# Patient Record
Sex: Female | Born: 1965 | Race: Black or African American | Hispanic: No | Marital: Single
Health system: Midwestern US, Community
[De-identification: ages and names within clinical notes are randomized; demographics above are authoritative.]

## PROBLEM LIST (undated history)

## (undated) DIAGNOSIS — I82409 Acute embolism and thrombosis of unspecified deep veins of unspecified lower extremity: Secondary | ICD-10-CM

## (undated) DIAGNOSIS — N6310 Unspecified lump in the right breast, unspecified quadrant: Secondary | ICD-10-CM

## (undated) DIAGNOSIS — Z9289 Personal history of other medical treatment: Secondary | ICD-10-CM

## (undated) DIAGNOSIS — I739 Peripheral vascular disease, unspecified: Secondary | ICD-10-CM

## (undated) DIAGNOSIS — S93432A Sprain of tibiofibular ligament of left ankle, initial encounter: Secondary | ICD-10-CM

## (undated) DIAGNOSIS — D649 Anemia, unspecified: Secondary | ICD-10-CM

## (undated) DIAGNOSIS — Z9889 Other specified postprocedural states: Secondary | ICD-10-CM

## (undated) DIAGNOSIS — T8859XA Other complications of anesthesia, initial encounter: Secondary | ICD-10-CM

## (undated) DIAGNOSIS — M8700 Idiopathic aseptic necrosis of unspecified bone: Secondary | ICD-10-CM

## (undated) DIAGNOSIS — Z95828 Presence of other vascular implants and grafts: Secondary | ICD-10-CM

## (undated) DIAGNOSIS — J42 Unspecified chronic bronchitis: Secondary | ICD-10-CM

## (undated) DIAGNOSIS — N12 Tubulo-interstitial nephritis, not specified as acute or chronic: Secondary | ICD-10-CM

## (undated) DIAGNOSIS — R011 Cardiac murmur, unspecified: Secondary | ICD-10-CM

## (undated) DIAGNOSIS — K219 Gastro-esophageal reflux disease without esophagitis: Secondary | ICD-10-CM

## (undated) DIAGNOSIS — L0390 Cellulitis, unspecified: Secondary | ICD-10-CM

## (undated) DIAGNOSIS — F4310 Post-traumatic stress disorder, unspecified: Secondary | ICD-10-CM

## (undated) DIAGNOSIS — R609 Edema, unspecified: Secondary | ICD-10-CM

## (undated) DIAGNOSIS — F419 Anxiety disorder, unspecified: Secondary | ICD-10-CM

## (undated) DIAGNOSIS — I2699 Other pulmonary embolism without acute cor pulmonale: Secondary | ICD-10-CM

## (undated) DIAGNOSIS — J189 Pneumonia, unspecified organism: Secondary | ICD-10-CM

## (undated) DIAGNOSIS — R569 Unspecified convulsions: Secondary | ICD-10-CM

## (undated) DIAGNOSIS — Z87442 Personal history of urinary calculi: Secondary | ICD-10-CM

## (undated) DIAGNOSIS — I10 Essential (primary) hypertension: Secondary | ICD-10-CM

## (undated) DIAGNOSIS — G43909 Migraine, unspecified, not intractable, without status migrainosus: Secondary | ICD-10-CM

## (undated) DIAGNOSIS — J45909 Unspecified asthma, uncomplicated: Secondary | ICD-10-CM

## (undated) DIAGNOSIS — R319 Hematuria, unspecified: Secondary | ICD-10-CM

## (undated) DIAGNOSIS — T4145XA Adverse effect of unspecified anesthetic, initial encounter: Secondary | ICD-10-CM

## (undated) DIAGNOSIS — M329 Systemic lupus erythematosus, unspecified: Secondary | ICD-10-CM

## (undated) DIAGNOSIS — C55 Malignant neoplasm of uterus, part unspecified: Secondary | ICD-10-CM

## (undated) DIAGNOSIS — N390 Urinary tract infection, site not specified: Secondary | ICD-10-CM

## (undated) DIAGNOSIS — M1990 Unspecified osteoarthritis, unspecified site: Secondary | ICD-10-CM

## (undated) HISTORY — PX: HIP SURGERY: SHX245

## (undated) HISTORY — PX: TOTAL HIP REVISION: SHX763

## (undated) HISTORY — PX: INCISION AND DRAINAGE HIP: SHX1801

## (undated) HISTORY — PX: JOINT REPLACEMENT: SHX530

## (undated) HISTORY — PX: APPLICATION OF WOUND VAC: SHX5189

## (undated) HISTORY — PX: EXTERNAL EAR SURGERY: SHX627

## (undated) HISTORY — PX: MYRINGOTOMY WITH TUBE PLACEMENT: SHX5663

## (undated) HISTORY — DX: Edema, unspecified: R60.9

## (undated) HISTORY — PX: DILATION AND CURETTAGE OF UTERUS: SHX78

---

## 1988-10-31 HISTORY — PX: CHOLECYSTECTOMY OPEN: SUR202

## 1993-10-31 HISTORY — PX: TUBAL LIGATION: SHX77

## 1995-11-01 DIAGNOSIS — C55 Malignant neoplasm of uterus, part unspecified: Secondary | ICD-10-CM

## 1995-11-01 HISTORY — DX: Malignant neoplasm of uterus, part unspecified: C55

## 1996-02-29 HISTORY — PX: VAGINAL HYSTERECTOMY: SUR661

## 1997-12-29 ENCOUNTER — Ambulatory Visit (HOSPITAL_COMMUNITY): Admission: RE | Admit: 1997-12-29 | Discharge: 1997-12-29 | Payer: Self-pay | Admitting: Obstetrics

## 1998-01-20 ENCOUNTER — Inpatient Hospital Stay (HOSPITAL_COMMUNITY): Admission: AD | Admit: 1998-01-20 | Discharge: 1998-01-20 | Payer: Self-pay | Admitting: *Deleted

## 1998-02-05 ENCOUNTER — Inpatient Hospital Stay (HOSPITAL_COMMUNITY): Admission: AD | Admit: 1998-02-05 | Discharge: 1998-02-05 | Payer: Self-pay | Admitting: Obstetrics & Gynecology

## 1998-02-19 ENCOUNTER — Encounter: Admission: RE | Admit: 1998-02-19 | Discharge: 1998-02-19 | Payer: Self-pay | Admitting: *Deleted

## 1998-02-26 ENCOUNTER — Encounter: Admission: RE | Admit: 1998-02-26 | Discharge: 1998-02-26 | Payer: Self-pay | Admitting: Obstetrics

## 1998-03-05 ENCOUNTER — Inpatient Hospital Stay (HOSPITAL_COMMUNITY): Admission: AD | Admit: 1998-03-05 | Discharge: 1998-03-05 | Payer: Self-pay | Admitting: Obstetrics & Gynecology

## 1998-03-15 ENCOUNTER — Emergency Department (HOSPITAL_COMMUNITY): Admission: EM | Admit: 1998-03-15 | Discharge: 1998-03-15 | Payer: Self-pay | Admitting: Emergency Medicine

## 1998-05-24 ENCOUNTER — Inpatient Hospital Stay (HOSPITAL_COMMUNITY): Admission: AD | Admit: 1998-05-24 | Discharge: 1998-05-24 | Payer: Self-pay | Admitting: Obstetrics

## 1998-05-31 ENCOUNTER — Emergency Department (HOSPITAL_COMMUNITY): Admission: EM | Admit: 1998-05-31 | Discharge: 1998-05-31 | Payer: Self-pay | Admitting: Emergency Medicine

## 1998-06-03 ENCOUNTER — Emergency Department (HOSPITAL_COMMUNITY): Admission: EM | Admit: 1998-06-03 | Discharge: 1998-06-03 | Payer: Self-pay | Admitting: Emergency Medicine

## 1998-06-16 ENCOUNTER — Emergency Department (HOSPITAL_COMMUNITY): Admission: EM | Admit: 1998-06-16 | Discharge: 1998-06-16 | Payer: Self-pay | Admitting: Internal Medicine

## 1998-06-26 ENCOUNTER — Emergency Department (HOSPITAL_COMMUNITY): Admission: EM | Admit: 1998-06-26 | Discharge: 1998-06-26 | Payer: Self-pay

## 1998-08-25 ENCOUNTER — Encounter: Payer: Self-pay | Admitting: Obstetrics

## 1998-08-25 ENCOUNTER — Inpatient Hospital Stay (HOSPITAL_COMMUNITY): Admission: AD | Admit: 1998-08-25 | Discharge: 1998-08-29 | Payer: Self-pay | Admitting: Obstetrics

## 1998-08-27 ENCOUNTER — Encounter: Payer: Self-pay | Admitting: Obstetrics

## 1999-02-22 ENCOUNTER — Emergency Department (HOSPITAL_COMMUNITY): Admission: EM | Admit: 1999-02-22 | Discharge: 1999-02-23 | Payer: Self-pay | Admitting: Emergency Medicine

## 1999-02-22 ENCOUNTER — Encounter: Payer: Self-pay | Admitting: Emergency Medicine

## 1999-06-14 ENCOUNTER — Emergency Department (HOSPITAL_COMMUNITY): Admission: EM | Admit: 1999-06-14 | Discharge: 1999-06-14 | Payer: Self-pay | Admitting: Emergency Medicine

## 1999-06-14 ENCOUNTER — Encounter: Payer: Self-pay | Admitting: Emergency Medicine

## 1999-09-27 ENCOUNTER — Emergency Department (HOSPITAL_COMMUNITY): Admission: EM | Admit: 1999-09-27 | Discharge: 1999-09-27 | Payer: Self-pay | Admitting: Emergency Medicine

## 1999-11-01 ENCOUNTER — Emergency Department (HOSPITAL_COMMUNITY): Admission: EM | Admit: 1999-11-01 | Discharge: 1999-11-01 | Payer: Self-pay | Admitting: Internal Medicine

## 1999-11-17 ENCOUNTER — Emergency Department (HOSPITAL_COMMUNITY): Admission: EM | Admit: 1999-11-17 | Discharge: 1999-11-17 | Payer: Self-pay

## 2000-02-16 ENCOUNTER — Emergency Department (HOSPITAL_COMMUNITY): Admission: EM | Admit: 2000-02-16 | Discharge: 2000-02-17 | Payer: Self-pay | Admitting: Emergency Medicine

## 2000-02-17 ENCOUNTER — Encounter: Payer: Self-pay | Admitting: Emergency Medicine

## 2000-02-29 ENCOUNTER — Emergency Department (HOSPITAL_COMMUNITY): Admission: EM | Admit: 2000-02-29 | Discharge: 2000-02-29 | Payer: Self-pay | Admitting: Emergency Medicine

## 2000-03-10 ENCOUNTER — Inpatient Hospital Stay (HOSPITAL_COMMUNITY): Admission: AD | Admit: 2000-03-10 | Discharge: 2000-03-10 | Payer: Self-pay | Admitting: Obstetrics & Gynecology

## 2000-03-14 ENCOUNTER — Other Ambulatory Visit: Admission: RE | Admit: 2000-03-14 | Discharge: 2000-03-14 | Payer: Self-pay | Admitting: *Deleted

## 2000-03-23 ENCOUNTER — Encounter: Payer: Self-pay | Admitting: *Deleted

## 2000-03-24 ENCOUNTER — Ambulatory Visit (HOSPITAL_COMMUNITY): Admission: RE | Admit: 2000-03-24 | Discharge: 2000-03-24 | Payer: Self-pay | Admitting: *Deleted

## 2000-03-27 ENCOUNTER — Emergency Department (HOSPITAL_COMMUNITY): Admission: EM | Admit: 2000-03-27 | Discharge: 2000-03-27 | Payer: Self-pay | Admitting: Emergency Medicine

## 2000-03-27 ENCOUNTER — Encounter: Payer: Self-pay | Admitting: Urology

## 2000-04-26 ENCOUNTER — Inpatient Hospital Stay (HOSPITAL_COMMUNITY): Admission: AD | Admit: 2000-04-26 | Discharge: 2000-04-26 | Payer: Self-pay | Admitting: *Deleted

## 2000-04-27 ENCOUNTER — Inpatient Hospital Stay (HOSPITAL_COMMUNITY): Admission: EM | Admit: 2000-04-27 | Discharge: 2000-04-27 | Payer: Self-pay | Admitting: *Deleted

## 2000-04-29 ENCOUNTER — Inpatient Hospital Stay (HOSPITAL_COMMUNITY): Admission: AD | Admit: 2000-04-29 | Discharge: 2000-04-29 | Payer: Self-pay | Admitting: *Deleted

## 2000-06-06 ENCOUNTER — Emergency Department (HOSPITAL_COMMUNITY): Admission: EM | Admit: 2000-06-06 | Discharge: 2000-06-06 | Payer: Self-pay | Admitting: Emergency Medicine

## 2000-06-13 ENCOUNTER — Emergency Department (HOSPITAL_COMMUNITY): Admission: EM | Admit: 2000-06-13 | Discharge: 2000-06-13 | Payer: Self-pay | Admitting: Emergency Medicine

## 2000-06-14 ENCOUNTER — Encounter: Admission: RE | Admit: 2000-06-14 | Discharge: 2000-06-14 | Payer: Self-pay | Admitting: Internal Medicine

## 2000-10-31 DIAGNOSIS — F4310 Post-traumatic stress disorder, unspecified: Secondary | ICD-10-CM

## 2000-10-31 HISTORY — PX: RECTAL SURGERY: SHX760

## 2000-10-31 HISTORY — PX: VAGINAL WOUND CLOSURE / REPAIR: SUR258

## 2000-10-31 HISTORY — DX: Post-traumatic stress disorder, unspecified: F43.10

## 2001-10-31 HISTORY — PX: EXPLORATORY LAPAROTOMY: SUR591

## 2007-02-14 ENCOUNTER — Emergency Department (HOSPITAL_COMMUNITY): Admission: EM | Admit: 2007-02-14 | Discharge: 2007-02-14 | Payer: Self-pay | Admitting: Emergency Medicine

## 2010-12-30 HISTORY — PX: TOTAL HIP ARTHROPLASTY: SHX124

## 2011-05-11 ENCOUNTER — Emergency Department (HOSPITAL_COMMUNITY)
Admission: EM | Admit: 2011-05-11 | Discharge: 2011-05-12 | Disposition: A | Discharge status: Home / Self Care | Payer: Medicare Other | Attending: Emergency Medicine | Admitting: Emergency Medicine

## 2011-05-11 ENCOUNTER — Emergency Department (HOSPITAL_COMMUNITY): Payer: Medicare Other

## 2011-05-11 DIAGNOSIS — M7989 Other specified soft tissue disorders: Secondary | ICD-10-CM | POA: Insufficient documentation

## 2011-05-11 DIAGNOSIS — I10 Essential (primary) hypertension: Secondary | ICD-10-CM | POA: Insufficient documentation

## 2011-05-11 DIAGNOSIS — M329 Systemic lupus erythematosus, unspecified: Secondary | ICD-10-CM | POA: Insufficient documentation

## 2011-05-11 DIAGNOSIS — J45909 Unspecified asthma, uncomplicated: Secondary | ICD-10-CM | POA: Insufficient documentation

## 2011-05-11 DIAGNOSIS — E119 Type 2 diabetes mellitus without complications: Secondary | ICD-10-CM | POA: Insufficient documentation

## 2011-05-11 DIAGNOSIS — S7000XA Contusion of unspecified hip, initial encounter: Secondary | ICD-10-CM | POA: Insufficient documentation

## 2011-05-11 DIAGNOSIS — W06XXXA Fall from bed, initial encounter: Secondary | ICD-10-CM | POA: Insufficient documentation

## 2011-05-11 DIAGNOSIS — M25559 Pain in unspecified hip: Secondary | ICD-10-CM | POA: Insufficient documentation

## 2011-05-11 DIAGNOSIS — Y92009 Unspecified place in unspecified non-institutional (private) residence as the place of occurrence of the external cause: Secondary | ICD-10-CM | POA: Insufficient documentation

## 2011-05-12 ENCOUNTER — Emergency Department (HOSPITAL_COMMUNITY): Payer: Medicare Other

## 2011-05-18 ENCOUNTER — Emergency Department (HOSPITAL_COMMUNITY): Payer: Medicare Other

## 2011-05-18 ENCOUNTER — Emergency Department (HOSPITAL_COMMUNITY)
Admission: EM | Admit: 2011-05-18 | Discharge: 2011-05-19 | Disposition: A | Discharge status: Home / Self Care | Payer: Medicare Other | Attending: Emergency Medicine | Admitting: Emergency Medicine

## 2011-05-18 DIAGNOSIS — H938X9 Other specified disorders of ear, unspecified ear: Secondary | ICD-10-CM | POA: Insufficient documentation

## 2011-05-18 DIAGNOSIS — M2550 Pain in unspecified joint: Secondary | ICD-10-CM | POA: Insufficient documentation

## 2011-05-18 DIAGNOSIS — J45909 Unspecified asthma, uncomplicated: Secondary | ICD-10-CM | POA: Insufficient documentation

## 2011-05-18 DIAGNOSIS — J3489 Other specified disorders of nose and nasal sinuses: Secondary | ICD-10-CM | POA: Insufficient documentation

## 2011-05-18 DIAGNOSIS — E119 Type 2 diabetes mellitus without complications: Secondary | ICD-10-CM | POA: Insufficient documentation

## 2011-05-18 DIAGNOSIS — I10 Essential (primary) hypertension: Secondary | ICD-10-CM | POA: Insufficient documentation

## 2011-05-18 DIAGNOSIS — H9209 Otalgia, unspecified ear: Secondary | ICD-10-CM | POA: Insufficient documentation

## 2011-05-18 DIAGNOSIS — H9210 Otorrhea, unspecified ear: Secondary | ICD-10-CM | POA: Insufficient documentation

## 2011-05-18 DIAGNOSIS — R079 Chest pain, unspecified: Secondary | ICD-10-CM | POA: Insufficient documentation

## 2011-05-18 DIAGNOSIS — Z79899 Other long term (current) drug therapy: Secondary | ICD-10-CM | POA: Insufficient documentation

## 2011-05-18 DIAGNOSIS — Z7982 Long term (current) use of aspirin: Secondary | ICD-10-CM | POA: Insufficient documentation

## 2011-05-18 DIAGNOSIS — K3189 Other diseases of stomach and duodenum: Secondary | ICD-10-CM | POA: Insufficient documentation

## 2011-05-18 DIAGNOSIS — M329 Systemic lupus erythematosus, unspecified: Secondary | ICD-10-CM | POA: Insufficient documentation

## 2011-05-18 DIAGNOSIS — H60399 Other infective otitis externa, unspecified ear: Secondary | ICD-10-CM | POA: Insufficient documentation

## 2011-05-18 LAB — CBC
HCT: 38.8 % (ref 36.0–46.0)
Hemoglobin: 13.3 g/dL (ref 12.0–15.0)
MCV: 72.9 fL — ABNORMAL LOW (ref 78.0–100.0)
RBC: 5.32 MIL/uL — ABNORMAL HIGH (ref 3.87–5.11)
RDW: 17.8 % — ABNORMAL HIGH (ref 11.5–15.5)
WBC: 14.1 10*3/uL — ABNORMAL HIGH (ref 4.0–10.5)

## 2011-05-18 LAB — BASIC METABOLIC PANEL
Chloride: 102 mEq/L (ref 96–112)
GFR calc Af Amer: >60 mL/min (ref >60)
GFR calc non Af Amer: >60 mL/min (ref >60)
Potassium: 3.5 mEq/L (ref 3.5–5.1)
Sodium: 141 mEq/L (ref 135–145)

## 2011-05-18 LAB — DIFFERENTIAL
Eosinophils Relative: 3 % (ref 0–5)
Lymphocytes Relative: 38 % (ref 12–46)
Lymphs Abs: 5.4 10*3/uL — ABNORMAL HIGH (ref 0.7–4.0)
Neutro Abs: 7.6 10*3/uL (ref 1.7–7.7)

## 2011-05-18 LAB — CK TOTAL AND CKMB (NOT AT ARMC)
CK, MB: 1.1 ng/mL (ref 0.3–4.0)
Relative Index: INVALID (ref 0.0–2.5)

## 2011-05-18 LAB — TROPONIN I: Troponin I: <0.3 ng/mL (ref <0.30)

## 2011-07-31 ENCOUNTER — Emergency Department (HOSPITAL_COMMUNITY)
Admission: EM | Admit: 2011-07-31 | Discharge: 2011-07-31 | Disposition: A | Discharge status: Home / Self Care | Payer: Medicare Other | Attending: Emergency Medicine | Admitting: Emergency Medicine

## 2011-07-31 ENCOUNTER — Emergency Department (HOSPITAL_COMMUNITY): Payer: Medicare Other

## 2011-07-31 DIAGNOSIS — IMO0001 Reserved for inherently not codable concepts without codable children: Secondary | ICD-10-CM | POA: Insufficient documentation

## 2011-07-31 DIAGNOSIS — M2550 Pain in unspecified joint: Secondary | ICD-10-CM | POA: Insufficient documentation

## 2011-07-31 DIAGNOSIS — R109 Unspecified abdominal pain: Secondary | ICD-10-CM | POA: Insufficient documentation

## 2011-07-31 DIAGNOSIS — Z79899 Other long term (current) drug therapy: Secondary | ICD-10-CM | POA: Insufficient documentation

## 2011-07-31 DIAGNOSIS — M329 Systemic lupus erythematosus, unspecified: Secondary | ICD-10-CM | POA: Insufficient documentation

## 2011-07-31 DIAGNOSIS — R319 Hematuria, unspecified: Secondary | ICD-10-CM | POA: Insufficient documentation

## 2011-07-31 DIAGNOSIS — I10 Essential (primary) hypertension: Secondary | ICD-10-CM | POA: Insufficient documentation

## 2011-07-31 DIAGNOSIS — E119 Type 2 diabetes mellitus without complications: Secondary | ICD-10-CM | POA: Insufficient documentation

## 2011-07-31 LAB — URINALYSIS, ROUTINE W REFLEX MICROSCOPIC
Bilirubin Urine: NEGATIVE
Nitrite: POSITIVE — AB
Specific Gravity, Urine: 1.024 (ref 1.005–1.030)
Urobilinogen, UA: 0.2 mg/dL (ref 0.0–1.0)
pH: 7 (ref 5.0–8.0)

## 2011-07-31 LAB — URINE MICROSCOPIC-ADD ON

## 2011-07-31 LAB — DIFFERENTIAL
Basophils Absolute: 0 10*3/uL (ref 0.0–0.1)
Lymphocytes Relative: 37 % (ref 12–46)
Lymphs Abs: 4 10*3/uL (ref 0.7–4.0)
Neutro Abs: 6.1 10*3/uL (ref 1.7–7.7)
Neutrophils Relative %: 56 % (ref 43–77)

## 2011-07-31 LAB — COMPREHENSIVE METABOLIC PANEL
Albumin: 3.7 g/dL (ref 3.5–5.2)
Alkaline Phosphatase: 93 U/L (ref 39–117)
BUN: 13 mg/dL (ref 6–23)
Calcium: 9.6 mg/dL (ref 8.4–10.5)
GFR calc Af Amer: >60 mL/min (ref >60)
Glucose, Bld: 79 mg/dL (ref 70–99)
Potassium: 3.5 mEq/L (ref 3.5–5.1)
Total Protein: 7.8 g/dL (ref 6.0–8.3)

## 2011-07-31 LAB — CBC
HCT: 34.5 % — ABNORMAL LOW (ref 36.0–46.0)
MCV: 77.2 fL — ABNORMAL LOW (ref 78.0–100.0)
RBC: 4.47 MIL/uL (ref 3.87–5.11)
RDW: 17.9 % — ABNORMAL HIGH (ref 11.5–15.5)
WBC: 10.8 10*3/uL — ABNORMAL HIGH (ref 4.0–10.5)

## 2011-07-31 LAB — LIPASE, BLOOD: Lipase: 21 U/L (ref 11–59)

## 2011-08-01 LAB — URINE CULTURE
Colony Count: 50000
Culture  Setup Time: 201210010149

## 2014-05-05 LAB — CBC WITH AUTOMATED DIFF
ABS. BASOPHILS: 0 10*3/uL (ref 0.0–0.06)
ABS. EOSINOPHILS: 0.2 10*3/uL (ref 0.0–0.4)
ABS. LYMPHOCYTES: 3.5 10*3/uL (ref 0.9–3.6)
ABS. MONOCYTES: 0.5 10*3/uL (ref 0.05–1.2)
ABS. NEUTROPHILS: 10 10*3/uL — ABNORMAL HIGH (ref 1.8–8.0)
BASOPHILS: 0 % (ref 0–2)
EOSINOPHILS: 2 % (ref 0–5)
HCT: 31.2 % — ABNORMAL LOW (ref 35.0–45.0)
HGB: 10.1 g/dL — ABNORMAL LOW (ref 12.0–16.0)
LYMPHOCYTES: 25 % (ref 21–52)
MCH: 26.2 PG (ref 24.0–34.0)
MCHC: 32.4 g/dL (ref 31.0–37.0)
MCV: 80.8 FL (ref 74.0–97.0)
MONOCYTES: 3 % (ref 3–10)
MPV: 8.2 FL — ABNORMAL LOW (ref 9.2–11.8)
NEUTROPHILS: 70 % (ref 40–73)
PLATELET: 744 10*3/uL — ABNORMAL HIGH (ref 135–420)
RBC: 3.86 M/uL — ABNORMAL LOW (ref 4.20–5.30)
RDW: 19.4 % — ABNORMAL HIGH (ref 11.6–14.5)
WBC: 14.3 10*3/uL — ABNORMAL HIGH (ref 4.6–13.2)

## 2014-05-05 LAB — URINALYSIS W/ RFLX MICROSCOPIC
Bilirubin: NEGATIVE
Blood: NEGATIVE
Glucose: NEGATIVE mg/dL
Ketone: NEGATIVE mg/dL
Leukocyte Esterase: NEGATIVE
Nitrites: NEGATIVE
Protein: NEGATIVE mg/dL
Specific gravity: >1.03 — ABNORMAL HIGH (ref 1.003–1.030)
Urobilinogen: 0.2 EU/dL (ref 0.2–1.0)
pH (UA): 6 (ref 5.0–8.0)

## 2014-05-05 LAB — METABOLIC PANEL, COMPREHENSIVE
A-G Ratio: 0.6 — ABNORMAL LOW (ref 0.8–1.7)
ALT (SGPT): 27 U/L (ref 13–56)
AST (SGOT): 13 U/L — ABNORMAL LOW (ref 15–37)
Albumin: 3.2 g/dL — ABNORMAL LOW (ref 3.4–5.0)
Alk. phosphatase: 115 U/L (ref 45–117)
Anion gap: 11 mmol/L (ref 3.0–18)
BUN/Creatinine ratio: 24 — ABNORMAL HIGH (ref 12–20)
BUN: 45 MG/DL — ABNORMAL HIGH (ref 7.0–18)
Bilirubin, total: 0.3 MG/DL (ref 0.2–1.0)
CO2: 20 mmol/L — ABNORMAL LOW (ref 21–32)
Calcium: 9.8 MG/DL (ref 8.5–10.1)
Chloride: 106 mmol/L (ref 100–108)
Creatinine: 1.86 MG/DL — ABNORMAL HIGH (ref 0.6–1.3)
GFR est AA: 37 mL/min/{1.73_m2} — ABNORMAL LOW (ref >60)
GFR est non-AA: 30 mL/min/{1.73_m2} — ABNORMAL LOW (ref >60)
Globulin: 5.4 g/dL — ABNORMAL HIGH (ref 2.0–4.0)
Glucose: 89 mg/dL (ref 74–99)
Potassium: 5.1 mmol/L (ref 3.5–5.5)
Protein, total: 8.6 g/dL — ABNORMAL HIGH (ref 6.4–8.2)
Sodium: 137 mmol/L (ref 136–145)

## 2014-05-05 LAB — LACTIC ACID: Lactic acid: 0.5 MMOL/L (ref 0.4–2.0)

## 2014-05-05 MED ORDER — HYDROMORPHONE (PF) 1 MG/ML IJ SOLN
1 mg/mL | INTRAMUSCULAR | Status: AC
Start: 2014-05-05 — End: 2014-05-05
  Administered 2014-05-05: 22:00:00 via INTRAVENOUS

## 2014-05-05 MED ORDER — ONDANSETRON (PF) 4 MG/2 ML INJECTION
4 mg/2 mL | INTRAMUSCULAR | Status: AC
Start: 2014-05-05 — End: 2014-05-05
  Administered 2014-05-05: 22:00:00 via INTRAVENOUS

## 2014-05-05 MED ORDER — ONDANSETRON (PF) 4 MG/2 ML INJECTION
4 mg/2 mL | INTRAMUSCULAR | Status: AC
Start: 2014-05-05 — End: 2014-05-05
  Administered 2014-05-05: 21:00:00 via INTRAVENOUS

## 2014-05-05 MED ORDER — HYDROMORPHONE (PF) 1 MG/ML IJ SOLN
1 mg/mL | INTRAMUSCULAR | Status: AC
Start: 2014-05-05 — End: 2014-05-05
  Administered 2014-05-05: 21:00:00 via INTRAVENOUS

## 2014-05-05 MED ADMIN — 0.9% sodium chloride infusion 1,000 mL: INTRAVENOUS | @ 21:00:00 | NDC 00409798309

## 2014-05-05 MED FILL — ONDANSETRON (PF) 4 MG/2 ML INJECTION: 4 mg/2 mL | INTRAMUSCULAR | Qty: 2

## 2014-05-05 MED FILL — SODIUM CHLORIDE 0.9 % IV: INTRAVENOUS | Qty: 1000

## 2014-05-05 MED FILL — HYDROMORPHONE (PF) 1 MG/ML IJ SOLN: 1 mg/mL | INTRAMUSCULAR | Qty: 1

## 2014-05-05 NOTE — ED Notes (Addendum)
Per consulate- WBC 16.2 . Right hip pain. Right hip surgery 2 weeks ago. Urinary retention for 2 days

## 2014-05-05 NOTE — ED Provider Notes (Signed)
HPI Comments: Pt is a 48 y.o. Female with a Hx of Right hip replacement and infection starting in 2012 who was brought to the ED by [facility] for a WBC of 16.2 today. Pt reports associated right hip pain with drainage after surgery 2 weeks ago (surgeon Dr Frutoso Schatz). Is also reporting urinary retention for the past 2 days, low BP and back pain. States that she had a fever 2 days ago and is currently on cipro. Denies current fever, chills, congestion, calor, increased erythema or drainage of surgical wound. Bladder scan showed 410 ml on arrival.  She is afebrile and her BP is 136/66 currently.   Paperwork from [facility] reports one low BP of 98/64 today, but is not shown to be consistently low and a WBC of 1620. She had blood work on 7/3 that showed elevated potassium, and urine culture that was sent with no results in paperwork.     Patient is a 48 y.o. female presenting with abnormal lab results. The history is provided by the patient, medical records and the nursing home.   Abnormal Lab Results  Pertinent negatives include no chest pain, no abdominal pain, no headaches and no shortness of breath.        No past medical history on file.     Past Surgical History   Procedure Laterality Date   ??? 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     History     Social History   ??? Marital Status: SINGLE     Spouse Name: N/A     Number of Children: N/A   ??? Years of Education: N/A     Occupational History   ??? Not on file.     Social History Main Topics   ??? Smoking status: Not on file   ??? Smokeless tobacco: Not on file   ??? Alcohol Use: Not on file   ??? Drug Use: Not on file   ??? Sexual Activity: Not on file     Other Topics Concern   ??? Not on file     Social History Narrative   ??? No narrative on file                  ALLERGIES: Compazine; Fluconazole; Haldol; Morphine; Reglan; and Toradol      Review of Systems    Constitutional: Negative for fever, chills and diaphoresis.   HENT: Negative for congestion and sore throat.    Respiratory: Negative for shortness of breath.    Cardiovascular: Negative for chest pain.   Gastrointestinal: Negative for vomiting, abdominal pain and diarrhea.   Genitourinary: Positive for difficulty urinating.   Musculoskeletal: Positive for back pain. Negative for myalgias.   Skin: Negative for rash.        Surgical wound   Neurological: Negative for headaches.   All other systems reviewed and are negative.      Filed Vitals:    05/05/14 1349 05/05/14 1540 05/05/14 1550 05/05/14 1600   BP: 136/66 134/87 143/105 118/53   Pulse: 67      Temp: 98.6 ??F (37 ??C)      Resp: 16      Height: '5\' 6"'  (1.676 m)      Weight: 109.77 kg (242 lb)      SpO2: 96% 100% 100%             Physical  Exam   Constitutional: She is oriented to person, place, and time. She appears well-developed and well-nourished. No distress.   HENT:   Head: Normocephalic and atraumatic.   Nose: Nose normal.   Mouth/Throat: Mucous membranes are normal.   Eyes: Conjunctivae are normal. Right eye exhibits no discharge. Left eye exhibits no discharge.   Neck: Neck supple. No JVD present.   Cardiovascular: Normal rate and regular rhythm.    No murmur heard.  Pulmonary/Chest: Effort normal and breath sounds normal. No respiratory distress. She has no wheezes. She has no rales.   Abdominal: Soft. There is no tenderness. There is no rebound and no guarding.   Musculoskeletal: She exhibits no edema or tenderness.   Bilateral lower extremities: no edema and good distal pulses   Lymphadenopathy:     She has no cervical adenopathy.   Neurological: She is alert and oriented to person, place, and time. She exhibits normal muscle tone. Coordination normal.   Skin: Skin is warm and dry. No rash noted. She is not diaphoretic.   Clean newly dressed surgical wound to lateral right hip, no surrounding erythema or calor. Drainage on dressing.     Psychiatric: She has a normal mood and affect.   Nursing note and vitals reviewed.       MDM  Number of Diagnoses or Management Options  History of hip replacement, total, right:   Leukocytosis:   Diagnosis management comments: IMP: Leukocytosis, malaise, ongoing hip pain. Sent after elevated WBC to 16.2. Here, pt afeb, alert, and non-toxic in appearance. No clinical indication to admit simply for WBC elevation in this otherwise non-toxic appearing pt. Wound may be source--pt on Bactrim for coverage. Culture sent.       Amount and/or Complexity of Data Reviewed  Clinical lab tests: ordered  Tests in the radiology section of CPT??: ordered and reviewed        Procedures    PROGRESS NOTES  1549: Assunta Curtis, MD arrives to the bedside to evaluate the patient. Answered the patient's questions regarding the treatment plan.  1747: Discussed results and pt will be discharged    CONSULTATIONS  None    ED PHYSICIAN ORDERS  Orders Placed This Encounter   ??? CULTURE, BLOOD     Standing Status: Standing      Number of Occurrences: 1      Standing Expiration Date:    ??? CULTURE, BLOOD     Standing Status: Standing      Number of Occurrences: 1      Standing Expiration Date:    ??? CULTURE, WOUND W GRAM STAIN     Standing Status: Standing      Number of Occurrences: 1      Standing Expiration Date:    ??? XR CHEST PORT     Standing Status: Standing      Number of Occurrences: 1      Standing Expiration Date:      Order Specific Question:  Reason for Exam     Answer:  fever, leukocytosis, eval for pneumonia     Order Specific Question:  Is Patient Pregnant?     Answer:  No   ??? URINALYSIS W/ RFLX MICROSCOPIC     Standing Status: Standing      Number of Occurrences: 1      Standing Expiration Date:    ??? CBC WITH AUTOMATED DIFF     Standing Status: Standing      Number of Occurrences: 1  Standing Expiration Date:    ??? METABOLIC PANEL, COMPREHENSIVE     Standing Status: Standing      Number of Occurrences: 1       Standing Expiration Date:    ??? LACTIC ACID, PLASMA     Standing Status: Standing      Number of Occurrences: 1      Standing Expiration Date:    ??? STRAIGHT CATHETER (NURSING) ONE TIME STAT     Standing Status: Standing      Number of Occurrences: 1      Standing Expiration Date:    ??? 0.9% sodium chloride infusion 1,000 mL     Sig:    ??? HYDROmorphone (PF) (DILAUDID) injection 1 mg     Sig:    ??? ondansetron (ZOFRAN) injection 4 mg     Sig:          MEDICATIONS ORDERED  Medications   0.9% sodium chloride infusion 1,000 mL (1,000 mL IntraVENous Given 05/05/14 1656)   HYDROmorphone (PF) (DILAUDID) injection 1 mg (1 mg IntraVENous Given 05/05/14 1651)   ondansetron (ZOFRAN) injection 4 mg (4 mg IntraVENous Given 05/05/14 1650)         RADIOLOGY INTERPRETATIONS  XR CHEST PORT   Impression:  1. No acute cardiopulmonary process.              EKG READINGS/LABORATORY RESULTS  Recent Results (from the past 12 hour(s))   URINALYSIS W/ RFLX MICROSCOPIC    Collection Time: 05/05/14  2:08 PM   Result Value Ref Range    Color YELLOW      Appearance CLEAR      Specific gravity >1.030 (H) 1.003 - 1.030    pH (UA) 6.0 5.0 - 8.0      Protein NEGATIVE  NEG mg/dL    Glucose NEGATIVE  NEG mg/dL    Ketone NEGATIVE  NEG mg/dL    Bilirubin NEGATIVE  NEG      Blood NEGATIVE  NEG      Urobilinogen 0.2 0.2 - 1.0 EU/dL    Nitrites NEGATIVE  NEG      Leukocyte Esterase NEGATIVE  NEG     CBC WITH AUTOMATED DIFF    Collection Time: 05/05/14  4:56 PM   Result Value Ref Range    WBC 14.3 (H) 4.6 - 13.2 K/uL    RBC 3.86 (L) 4.20 - 5.30 M/uL    HGB 10.1 (L) 12.0 - 16.0 g/dL    HCT 31.2 (L) 35.0 - 45.0 %    MCV 80.8 74.0 - 97.0 FL    MCH 26.2 24.0 - 34.0 PG    MCHC 32.4 31.0 - 37.0 g/dL    RDW 19.4 (H) 11.6 - 14.5 %    PLATELET 744 (H) 135 - 420 K/uL    MPV 8.2 (L) 9.2 - 11.8 FL    NEUTROPHILS 70 40 - 73 %    LYMPHOCYTES 25 21 - 52 %    MONOCYTES 3 3 - 10 %    EOSINOPHILS 2 0 - 5 %    BASOPHILS 0 0 - 2 %    ABS. NEUTROPHILS 10.0 (H) 1.8 - 8.0 K/UL     ABS. LYMPHOCYTES 3.5 0.9 - 3.6 K/UL    ABS. MONOCYTES 0.5 0.05 - 1.2 K/UL    ABS. EOSINOPHILS 0.2 0.0 - 0.4 K/UL    ABS. BASOPHILS 0.0 0.0 - 0.06 K/UL    DF AUTOMATED     METABOLIC PANEL, COMPREHENSIVE  Collection Time: 05/05/14  4:56 PM   Result Value Ref Range    Sodium 137 136 - 145 mmol/L    Potassium 5.1 3.5 - 5.5 mmol/L    Chloride 106 100 - 108 mmol/L    CO2 20 (L) 21 - 32 mmol/L    Anion gap 11 3.0 - 18 mmol/L    Glucose 89 74 - 99 mg/dL    BUN 45 (H) 7.0 - 18 MG/DL    Creatinine 1.86 (H) 0.6 - 1.3 MG/DL    BUN/Creatinine ratio 24 (H) 12 - 20      GFR est AA 37 (L) >60 ml/min/1.12m    GFR est non-AA 30 (L) >60 ml/min/1.74m   Calcium 9.8 8.5 - 10.1 MG/DL    Bilirubin, total 0.3 0.2 - 1.0 MG/DL    ALT 27 13 - 56 U/L    AST 13 (L) 15 - 37 U/L    Alk. phosphatase 115 45 - 117 U/L    Protein, total 8.6 (H) 6.4 - 8.2 g/dL    Albumin 3.2 (L) 3.4 - 5.0 g/dL    Globulin 5.4 (H) 2.0 - 4.0 g/dL    A-G Ratio 0.6 (L) 0.8 - 1.7     CULTURE, WOUND W GRAM STAIN    Collection Time: 05/05/14  4:56 PM   Result Value Ref Range    Special Requests: NO SPECIAL REQUESTS      GRAM STAIN NO WBC'S SEEN      GRAM STAIN RARE  GRAM POSITIVE COCCI  IN PAIRS        GRAM STAIN MANY  GRAM POSITIVE RODS        Culture result: PENDING          ED DIAGNOSIS & DISPOSITION INFORMATION  Diagnosis:   1. Leukocytosis    2. History of hip replacement, total, right          Disposition: Discharged    Follow-up Information    Follow up With Details Comments [facility] In 1 day  [id number]  [id number] In 1 week  [id number]          Patient's Medications    No medications on file         SCFranklinDocumented by: KeLeveda Annascribing for and in the presence of RoAssunta CurtisMD. [phone]   PROVIDER ATTESTATION STATEMENT   I personally performed the services described in the documentation, reviewed the documentation, as recorded by the scribe in my presence, and it accurately and completely records my words and actions.  RoAssunta CurtisMD.

## 2014-05-05 NOTE — ED Notes (Signed)
Blood cultures x2 done via [facility] and Dr. Alan Ripperlaire aware. Difficulty starting IV.

## 2014-05-05 NOTE — ED Notes (Signed)
Bladder scan- 410 ml

## 2014-05-05 NOTE — ED Notes (Signed)
I have reviewed discharge instructions with the patient.  The patient verbalized understanding.  Patient armband removed and shredded

## 2014-05-08 LAB — CULTURE, WOUND W GRAM STAIN: GRAM STAIN: NONE SEEN

## 2014-05-11 LAB — CULTURE, BLOOD
Culture result:: NO GROWTH
Culture result:: NO GROWTH

## 2014-10-31 DIAGNOSIS — I2699 Other pulmonary embolism without acute cor pulmonale: Secondary | ICD-10-CM

## 2014-10-31 HISTORY — DX: Other pulmonary embolism without acute cor pulmonale: I26.99

## 2014-10-31 HISTORY — PX: TOTAL HIP ARTHROPLASTY WITH HARDWARE REMOVAL: SHX6438

## 2015-06-15 ENCOUNTER — Inpatient Hospital Stay
Admit: 2015-06-15 | Discharge: 2015-06-19 | Disposition: A | Discharge status: Home Health | Payer: MEDICARE | Attending: Internal Medicine | Admitting: Internal Medicine

## 2015-06-15 ENCOUNTER — Emergency Department: Admit: 2015-06-15 | Payer: MEDICARE | Primary: Family Medicine

## 2015-06-15 DIAGNOSIS — D684 Acquired coagulation factor deficiency: Principal | ICD-10-CM

## 2015-06-15 LAB — CBC WITH AUTOMATED DIFF
ABS. BASOPHILS: 0 10*3/uL (ref 0.0–0.06)
ABS. EOSINOPHILS: 0.2 10*3/uL (ref 0.0–0.4)
ABS. LYMPHOCYTES: 3 10*3/uL (ref 0.9–3.6)
ABS. MONOCYTES: 0.3 10*3/uL (ref 0.05–1.2)
ABS. NEUTROPHILS: 7.2 10*3/uL (ref 1.8–8.0)
BASOPHILS: 0 % (ref 0–2)
EOSINOPHILS: 2 % (ref 0–5)
HCT: 32.9 % — ABNORMAL LOW (ref 35.0–45.0)
HGB: 10.6 g/dL — ABNORMAL LOW (ref 12.0–16.0)
LYMPHOCYTES: 28 % (ref 21–52)
MCH: 25.1 PG (ref 24.0–34.0)
MCHC: 32.2 g/dL (ref 31.0–37.0)
MCV: 78 FL (ref 74.0–97.0)
MONOCYTES: 3 % (ref 3–10)
MPV: 9.2 FL (ref 9.2–11.8)
NEUTROPHILS: 67 % (ref 40–73)
PLATELET: 388 10*3/uL (ref 135–420)
RBC: 4.22 M/uL (ref 4.20–5.30)
RDW: 16.9 % — ABNORMAL HIGH (ref 11.6–14.5)
WBC: 10.7 10*3/uL (ref 4.6–13.2)

## 2015-06-15 LAB — URINE MICROSCOPIC ONLY
RBC: >100 /hpf — ABNORMAL HIGH (ref 0–5)
WBC: 11 /hpf (ref 0–4)

## 2015-06-15 LAB — MAGNESIUM: Magnesium: 1.4 mg/dL — ABNORMAL LOW (ref 1.8–2.4)

## 2015-06-15 LAB — PROTHROMBIN TIME + INR
INR: 5.6 — CR (ref 0.8–1.2)
INR: 4 — ABNORMAL HIGH (ref 0.8–1.2)
Prothrombin time: 47.5 s — ABNORMAL HIGH (ref 11.5–15.2)
Prothrombin time: 36.5 s — ABNORMAL HIGH (ref 11.5–15.2)

## 2015-06-15 LAB — METABOLIC PANEL, COMPREHENSIVE
A-G Ratio: 0.8 (ref 0.8–1.7)
ALT (SGPT): 18 U/L (ref 13–56)
AST (SGOT): 13 U/L — ABNORMAL LOW (ref 15–37)
Albumin: 3.3 g/dL — ABNORMAL LOW (ref 3.4–5.0)
Alk. phosphatase: 100 U/L (ref 45–117)
Anion gap: 12 mmol/L (ref 3.0–18)
BUN/Creatinine ratio: 16 (ref 12–20)
BUN: 20 MG/DL — ABNORMAL HIGH (ref 7.0–18)
Bilirubin, total: 0.2 MG/DL (ref 0.2–1.0)
CO2: 19 mmol/L — ABNORMAL LOW (ref 21–32)
Calcium: 8 MG/DL — ABNORMAL LOW (ref 8.5–10.1)
Chloride: 112 mmol/L — ABNORMAL HIGH (ref 100–108)
Creatinine: 1.24 MG/DL (ref 0.6–1.3)
GFR est AA: 55 mL/min/{1.73_m2} — ABNORMAL LOW (ref >60)
GFR est non-AA: 46 mL/min/{1.73_m2} — ABNORMAL LOW (ref >60)
Globulin: 4.4 g/dL — ABNORMAL HIGH (ref 2.0–4.0)
Glucose: 114 mg/dL — ABNORMAL HIGH (ref 74–99)
Potassium: 3.4 mmol/L — ABNORMAL LOW (ref 3.5–5.5)
Protein, total: 7.7 g/dL (ref 6.4–8.2)
Sodium: 143 mmol/L (ref 136–145)

## 2015-06-15 LAB — URINALYSIS W/ RFLX MICROSCOPIC
Bilirubin: NEGATIVE
Glucose: NEGATIVE mg/dL
Ketone: NEGATIVE mg/dL
Nitrites: NEGATIVE
Protein: 100 mg/dL — AB
Specific gravity: 1.02 (ref 1.005–1.030)
Urobilinogen: 0.2 EU/dL (ref 0.2–1.0)
pH (UA): 5.5 (ref 5.0–8.0)

## 2015-06-15 LAB — FDP, PLASMA: FIBRIN DEGRAD. PROD.: <5 UG/ML (ref <5)

## 2015-06-15 LAB — PTT
aPTT: 80.1 s — ABNORMAL HIGH (ref 23.0–36.4)
aPTT: 48.2 s — ABNORMAL HIGH (ref 23.0–36.4)

## 2015-06-15 LAB — HGB & HCT
HCT: 33 % — ABNORMAL LOW (ref 35.0–45.0)
HCT: 33.6 % — ABNORMAL LOW (ref 35.0–45.0)
HGB: 10.5 g/dL — ABNORMAL LOW (ref 12.0–16.0)
HGB: 10.9 g/dL — ABNORMAL LOW (ref 12.0–16.0)

## 2015-06-15 LAB — D DIMER: D DIMER: 0.58 ug/ml(FEU) — ABNORMAL HIGH (ref <0.46)

## 2015-06-15 LAB — FIBRINOGEN: Fibrinogen: 542 mg/dL — ABNORMAL HIGH (ref 210–451)

## 2015-06-15 LAB — LIPASE: Lipase: 69 U/L — ABNORMAL LOW (ref 73–393)

## 2015-06-15 LAB — D-DIMER, QUANTITATIVE: D-Dimer, Quant: 0.58 ug/ml(FEU) — ABNORMAL HIGH (ref <0.46)

## 2015-06-15 MED ORDER — MAGNESIUM SULFATE 2 GRAM/50 ML IVPB
2 gram/50 mL (4 %) | Freq: Once | INTRAVENOUS | Status: AC
Start: 2015-06-15 — End: 2015-06-15
  Administered 2015-06-15: 18:00:00 via INTRAVENOUS

## 2015-06-15 MED ORDER — IOPAMIDOL 61 % IV SOLN
300 mg iodine /mL (61 %) | Freq: Once | INTRAVENOUS | Status: AC
Start: 2015-06-15 — End: 2015-06-15
  Administered 2015-06-15: 19:00:00 via INTRAVENOUS

## 2015-06-15 MED ORDER — PHYTONADIONE 10 MG/ML INJECTION
10 mg/mL | Freq: Once | INTRAMUSCULAR | Status: AC
Start: 2015-06-15 — End: 2015-06-15
  Administered 2015-06-15: 22:00:00 via INTRAVENOUS

## 2015-06-15 MED ORDER — LISINOPRIL 20 MG TAB
20 mg | Freq: Every day | ORAL | Status: DC
Start: 2015-06-15 — End: 2015-06-16

## 2015-06-15 MED ORDER — HYDROMORPHONE (PF) 1 MG/ML IJ SOLN
1 mg/mL | INTRAMUSCULAR | Status: AC
Start: 2015-06-15 — End: 2015-06-15
  Administered 2015-06-15: 17:00:00 via INTRAVENOUS

## 2015-06-15 MED ORDER — PHYTONADIONE 5 MG TAB
5 mg | ORAL | Status: AC
Start: 2015-06-15 — End: 2015-06-15
  Administered 2015-06-15: 18:00:00 via ORAL

## 2015-06-15 MED ORDER — HYDROXYCHLOROQUINE 200 MG TAB
200 mg | Freq: Every day | ORAL | Status: DC
Start: 2015-06-15 — End: 2015-06-19
  Administered 2015-06-16 – 2015-06-19 (×3): via ORAL

## 2015-06-15 MED ORDER — CLONIDINE 0.1 MG TAB
0.1 mg | Freq: Two times a day (BID) | ORAL | Status: DC
Start: 2015-06-15 — End: 2015-06-19
  Administered 2015-06-15 – 2015-06-19 (×7): via ORAL

## 2015-06-15 MED ORDER — POTASSIUM CHLORIDE SR 20 MEQ TAB, PARTICLES/CRYSTALS
20 mEq | ORAL | Status: AC
Start: 2015-06-15 — End: 2015-06-16
  Administered 2015-06-15: 20:00:00 via ORAL

## 2015-06-15 MED ORDER — PANTOPRAZOLE 40 MG IV SOLR
40 mg | INTRAVENOUS | Status: AC
Start: 2015-06-15 — End: 2015-06-15
  Administered 2015-06-15: 17:00:00 via INTRAVENOUS

## 2015-06-15 MED ORDER — PHYTONADIONE 5 MG TAB
5 mg | ORAL | Status: AC
Start: 2015-06-15 — End: 2015-06-15
  Administered 2015-06-16: 01:00:00 via ORAL

## 2015-06-15 MED ORDER — ALBUTEROL SULFATE 0.083 % (0.83 MG/ML) SOLN FOR INHALATION
2.5 mg /3 mL (0.083 %) | RESPIRATORY_TRACT | Status: DC | PRN
Start: 2015-06-15 — End: 2015-06-19

## 2015-06-15 MED ORDER — MONTELUKAST 10 MG TAB
10 mg | Freq: Every day | ORAL | Status: DC
Start: 2015-06-15 — End: 2015-06-19
  Administered 2015-06-16 – 2015-06-19 (×3): via ORAL

## 2015-06-15 MED ORDER — PIPERACILLIN-TAZOBACTAM 3.375 GRAM IV SOLR
3.375 gram | Freq: Four times a day (QID) | INTRAVENOUS | Status: DC
Start: 2015-06-15 — End: 2015-06-16
  Administered 2015-06-15 – 2015-06-16 (×3): via INTRAVENOUS

## 2015-06-15 MED ORDER — FERROUS SULFATE 325 MG (65 MG ELEMENTAL IRON) TAB
325 mg (65 mg iron) | Freq: Two times a day (BID) | ORAL | Status: DC
Start: 2015-06-15 — End: 2015-06-19
  Administered 2015-06-15 – 2015-06-19 (×7): via ORAL

## 2015-06-15 MED ORDER — SODIUM CHLORIDE 0.9 % IV
INTRAVENOUS | Status: DC
Start: 2015-06-15 — End: 2015-06-18
  Administered 2015-06-15 – 2015-06-17 (×3): via INTRAVENOUS

## 2015-06-15 MED ORDER — SODIUM CHLORIDE 0.9 % IV PIGGY BACK
4.5 gram | INTRAVENOUS | Status: AC
Start: 2015-06-15 — End: 2015-06-15
  Administered 2015-06-15: 18:00:00 via INTRAVENOUS

## 2015-06-15 MED ORDER — ARFORMOTEROL 15 MCG/2 ML NEB SOLUTION
15 mcg/2 mL | Freq: Two times a day (BID) | RESPIRATORY_TRACT | Status: DC
Start: 2015-06-15 — End: 2015-06-19
  Administered 2015-06-17 – 2015-06-18 (×5): via RESPIRATORY_TRACT

## 2015-06-15 MED ORDER — HYDROMORPHONE (PF) 1 MG/ML IJ SOLN
1 mg/mL | INTRAMUSCULAR | Status: AC
Start: 2015-06-15 — End: 2015-06-15
  Administered 2015-06-15: 19:00:00 via INTRAVENOUS

## 2015-06-15 MED ORDER — GABAPENTIN 300 MG CAP
300 mg | Freq: Three times a day (TID) | ORAL | Status: DC
Start: 2015-06-15 — End: 2015-06-19
  Administered 2015-06-16 – 2015-06-19 (×10): via ORAL

## 2015-06-15 MED ORDER — BUDESONIDE 0.5 MG/2 ML NEB SUSPENSION
0.5 mg/2 mL | Freq: Two times a day (BID) | RESPIRATORY_TRACT | Status: DC
Start: 2015-06-15 — End: 2015-06-19
  Administered 2015-06-17 – 2015-06-18 (×5): via RESPIRATORY_TRACT

## 2015-06-15 MED ORDER — BUDESONIDE-FORMOTEROL HFA 80 MCG-4.5 MCG/ACTUATION AEROSOL INHALER
Freq: Two times a day (BID) | RESPIRATORY_TRACT | Status: DC
Start: 2015-06-15 — End: 2015-06-15

## 2015-06-15 MED ORDER — AMLODIPINE 10 MG TAB
10 mg | Freq: Every day | ORAL | Status: DC
Start: 2015-06-15 — End: 2015-06-19
  Administered 2015-06-16 – 2015-06-19 (×3): via ORAL

## 2015-06-15 MED ORDER — ACETAMINOPHEN 325 MG TABLET
325 mg | ORAL | Status: DC | PRN
Start: 2015-06-15 — End: 2015-06-19
  Administered 2015-06-17: 04:00:00 via ORAL

## 2015-06-15 MED ORDER — SODIUM CHLORIDE 0.9 % IV PIGGY BACK
40 mg | INTRAVENOUS | Status: DC
Start: 2015-06-15 — End: 2015-06-16
  Administered 2015-06-15 – 2015-06-16 (×2): via INTRAVENOUS

## 2015-06-15 MED ORDER — THERAPEUTIC MULTIVITAMIN TAB
Freq: Every day | ORAL | Status: DC
Start: 2015-06-15 — End: 2015-06-19
  Administered 2015-06-16 – 2015-06-19 (×3): via ORAL

## 2015-06-15 MED ORDER — ONDANSETRON (PF) 4 MG/2 ML INJECTION
4 mg/2 mL | INTRAMUSCULAR | Status: AC
Start: 2015-06-15 — End: 2015-06-15
  Administered 2015-06-15: 19:00:00 via INTRAVENOUS

## 2015-06-15 MED ORDER — ONDANSETRON (PF) 4 MG/2 ML INJECTION
4 mg/2 mL | INTRAMUSCULAR | Status: AC
Start: 2015-06-15 — End: 2015-06-15
  Administered 2015-06-15: 16:00:00 via INTRAVENOUS

## 2015-06-15 MED ORDER — METHYLPREDNISOLONE 4 MG TAB
4 mg | Freq: Every day | ORAL | Status: DC
Start: 2015-06-15 — End: 2015-06-19
  Administered 2015-06-16 – 2015-06-19 (×3): via ORAL

## 2015-06-15 MED ORDER — OXYCODONE-ACETAMINOPHEN 5 MG-325 MG TAB
5-325 mg | ORAL | Status: DC | PRN
Start: 2015-06-15 — End: 2015-06-19
  Administered 2015-06-16 – 2015-06-19 (×4): via ORAL

## 2015-06-15 MED ORDER — ONDANSETRON (PF) 4 MG/2 ML INJECTION
4 mg/2 mL | INTRAMUSCULAR | Status: DC | PRN
Start: 2015-06-15 — End: 2015-06-19
  Administered 2015-06-15 – 2015-06-19 (×6): via INTRAVENOUS

## 2015-06-15 MED FILL — ONDANSETRON (PF) 4 MG/2 ML INJECTION: 4 mg/2 mL | INTRAMUSCULAR | Qty: 2

## 2015-06-15 MED FILL — HYDROMORPHONE (PF) 1 MG/ML IJ SOLN: 1 mg/mL | INTRAMUSCULAR | Qty: 1

## 2015-06-15 MED FILL — CLONIDINE 0.1 MG TAB: 0.1 mg | ORAL | Qty: 2

## 2015-06-15 MED FILL — ZOSYN 3.375 GRAM INTRAVENOUS SOLUTION: 3.375 gram | INTRAVENOUS | Qty: 3.38

## 2015-06-15 MED FILL — FERROUS SULFATE 325 MG (65 MG ELEMENTAL IRON) TAB: 325 mg (65 mg iron) | ORAL | Qty: 1

## 2015-06-15 MED FILL — PIPERACILLIN-TAZOBACTAM 4.5 GRAM IV SOLR: 4.5 gram | INTRAVENOUS | Qty: 4.5

## 2015-06-15 MED FILL — MAGNESIUM SULFATE 2 GRAM/50 ML IVPB: 2 gram/50 mL (4 %) | INTRAVENOUS | Qty: 50

## 2015-06-15 MED FILL — ISOVUE-300  61 % INTRAVENOUS SOLUTION: 300 mg iodine /mL (61 %) | INTRAVENOUS | Qty: 75

## 2015-06-15 MED FILL — VITAMIN K1 10 MG/ML INJECTION SOLUTION: 10 mg/mL | INTRAMUSCULAR | Qty: 0.5

## 2015-06-15 MED FILL — PROTONIX 40 MG INTRAVENOUS SOLUTION: 40 mg | INTRAVENOUS | Qty: 40

## 2015-06-15 MED FILL — BUDESONIDE 0.5 MG/2 ML NEB SUSPENSION: 0.5 mg/2 mL | RESPIRATORY_TRACT | Qty: 1

## 2015-06-15 MED FILL — BROVANA 15 MCG/2 ML SOLUTION FOR NEBULIZATION: 15 mcg/2 mL | RESPIRATORY_TRACT | Qty: 2

## 2015-06-15 MED FILL — MEPHYTON 5 MG TABLET: 5 mg | ORAL | Qty: 1

## 2015-06-15 MED FILL — SODIUM CHLORIDE 0.9 % IV: INTRAVENOUS | Qty: 1000

## 2015-06-15 MED FILL — PROTONIX 40 MG INTRAVENOUS SOLUTION: 40 mg | INTRAVENOUS | Qty: 80
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
## 2015-06-15 NOTE — Progress Notes (Signed)
Chaplain conducted an initial consultation and Spiritual Assessment for Sydney Luna, who is a 49 y.o.,female. Patient???s Primary Language is: [place].   According to the patient???s EMR Religious Affiliation is: Pentecostal.     The reason the Patient came to the hospital is:   Patient Active Problem List    Diagnosis Date Noted   ??? History of pulmonary embolism 06/15/2015   ??? Lupus (HCC) 06/15/2015   ??? History of DVT (deep vein thrombosis) 06/15/2015   ??? Coagulopathy (HCC) 06/15/2015   ??? Hematuria 06/15/2015   ??? UTI (urinary tract infection) 06/15/2015   ??? GI bleed    ??? Presence of IVC filter         The Chaplain provided the following Interventions:  Initiated a relationship of care and support.   Explored issues of faith, belief, spirituality and religious/ritual needs while hospitalized.  Listened empathically.  Provided information about Spiritual Care Services.  Offered prayer and assurance of continued prayers on patient's behalf.   Chart reviewed.    The following outcomes were achieved:  Patient shared limited information about both their medical narrative and spiritual journey/beliefs.  Patient processed feeling about current hospitalization.  Patient expressed gratitude for chaplain's visit.    Assessment:  Patient does not have any religious/cultural needs that will affect patient???s preferences in health care.  There are no further spiritual or religious issues which require intervention at this time.     Plan:  Chaplains will continue to follow and will provide pastoral care on an as needed/requested basis.  Chaplain recommends bedside caregivers page chaplain on duty if patient shows signs of acute spiritual or emotional distress.      Alcario DroughtEdmond Mensah, M.Div.  [facility] PaChaplain   Spiritual Care Department  [phone]
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
## 2015-06-15 NOTE — Progress Notes (Signed)
Patient refused Brovana and pumicort neb treatment.

## 2015-06-15 NOTE — ED Triage Notes (Signed)
Pt stated she is on blood thinners and has filter placed for hx of DVT.

## 2015-06-15 NOTE — Other (Signed)
1915 - Bedside and Verbal shift change report received from Peri Maris, RN (offgoing nurse) by Derenda Fennel, RN (offgoing nurse). Report included the following information SBAR, Intake/Output, MAR, Recent Results and Med Rec Status.    2111 - Patient requested pain medication for her lower back (7/10 pain level).    8756 - Patient woke up from her sleep scared about some blood stains on her sheet, and on her gown. I was a scant amount, and neither spots appeared actively bleeding, but nurse decided to call Dr. Romie Jumper paged, and lab was called for update on recent coagulation results. There were no results available yet.    4332 - Nurse came in the room to start an infusion of Zosyn, and realized that the bag of Vitamin K was hung, but it had not ran. Nurse asked advice to [facility], more experienced RN, and decided to follow Celeste's advice, so Vitamin K was started. Nurse stayed in the patient's room for the whole infusion of Vitamin K. No incidents noted at the time. Zosyn started right after (0300 am).    0300 - Patient requested to urinate again. This time urine was cranberry colored. Patient became anxious because of the blood in the urine, and the nosebleed shows no signs of stopping.    0335 - Pt called because her nose was bleeding. Nurse advised pt to stay in bed, with the head back, and a cool compress on a cool compress.    9518 - Bleeding is not slowing down, so nurse pages Dr. Romie Jumper. He is made aware that patient is bleeding from the nose, from the bladder, and from mediport. He ordered to repeat INR, and to keep treating pt with Vitamin K until INR is at least 1.4.    8416 - Blood drawn from mediport from nurse. Lilly, Environmental consultant, called phlebotomist to pick up tubes.    6063 - Dr Romie Jumper paged. Nosebleed still not under control, and patient C/O excruciating pain. Dr Romie Jumper ordered to put pressure on the nose one more time, up to 30 minutes. If bleeding does not stop, to ask patient to  blow her nose and spray Afrin. He also requested that another unit of IVPB Vitamin K to be infused.    0715 - Celene Kras and Evalina Field aware of the situation and took over patient care. Patient will be transferred to ICU, after a STAT CT of the abdomen is completed.    0160 Waynetta Sandy, day shift charge nurse

## 2015-06-15 NOTE — Progress Notes (Addendum)
1722 Received pt from ED. Pt alert and stable. No signs of acute distress. Pt placed on tele running NSR in 80's.     1800 Pt ordered tray from dietary. Zofran provided for pt in order to take meds prior to meal. CNA notified RN of BP. Scheduled clonidine provided.Pt has no other needs at this time. Will continue to monitor.     Bedside and Verbal shift change report given to Derenda Fennel, RN (oncoming nurse) by Peri Maris, RN (offgoing nurse). Report included the following information SBAR, Procedure Summary, Intake/Output, MAR, Recent Results and Cardiac Rhythm NSR.

## 2015-06-15 NOTE — Progress Notes (Signed)
Pt. Blood pressure is 152/92 notified shannon

## 2015-06-16 ENCOUNTER — Inpatient Hospital Stay: Admit: 2015-06-16 | Payer: MEDICARE | Primary: Family Medicine

## 2015-06-16 LAB — CBC WITH AUTOMATED DIFF
ABS. BASOPHILS: 0 10*3/uL (ref 0.0–0.06)
ABS. BASOPHILS: 0 10*3/uL (ref 0.0–0.06)
ABS. EOSINOPHILS: 0.2 10*3/uL (ref 0.0–0.4)
ABS. EOSINOPHILS: 0.3 10*3/uL (ref 0.0–0.4)
ABS. LYMPHOCYTES: 3.1 10*3/uL (ref 0.9–3.6)
ABS. LYMPHOCYTES: 3.3 10*3/uL (ref 0.9–3.6)
ABS. MONOCYTES: 0.6 10*3/uL (ref 0.05–1.2)
ABS. MONOCYTES: 0.4 10*3/uL (ref 0.05–1.2)
ABS. NEUTROPHILS: 7.4 10*3/uL (ref 1.8–8.0)
ABS. NEUTROPHILS: 7.2 10*3/uL (ref 1.8–8.0)
BASOPHILS: 0 % (ref 0–2)
BASOPHILS: 0 % (ref 0–2)
EOSINOPHILS: 2 % (ref 0–5)
EOSINOPHILS: 2 % (ref 0–5)
HCT: 30.2 % — ABNORMAL LOW (ref 35.0–45.0)
HCT: 30.9 % — ABNORMAL LOW (ref 35.0–45.0)
HGB: 9.6 g/dL — ABNORMAL LOW (ref 12.0–16.0)
HGB: 9.9 g/dL — ABNORMAL LOW (ref 12.0–16.0)
LYMPHOCYTES: 27 % (ref 21–52)
LYMPHOCYTES: 29 % (ref 21–52)
MCH: 24.9 PG (ref 24.0–34.0)
MCH: 25.1 PG (ref 24.0–34.0)
MCHC: 31.8 g/dL (ref 31.0–37.0)
MCHC: 32 g/dL (ref 31.0–37.0)
MCV: 78.2 FL (ref 74.0–97.0)
MCV: 78.4 FL (ref 74.0–97.0)
MONOCYTES: 5 % (ref 3–10)
MONOCYTES: 4 % (ref 3–10)
MPV: 9.1 FL — ABNORMAL LOW (ref 9.2–11.8)
MPV: 9.5 FL (ref 9.2–11.8)
NEUTROPHILS: 66 % (ref 40–73)
NEUTROPHILS: 65 % (ref 40–73)
PLATELET: 341 10*3/uL (ref 135–420)
PLATELET: 362 10*3/uL (ref 135–420)
RBC: 3.86 M/uL — ABNORMAL LOW (ref 4.20–5.30)
RBC: 3.94 M/uL — ABNORMAL LOW (ref 4.20–5.30)
RDW: 17.1 % — ABNORMAL HIGH (ref 11.6–14.5)
RDW: 17 % — ABNORMAL HIGH (ref 11.6–14.5)
WBC: 11.3 10*3/uL (ref 4.6–13.2)
WBC: 11.2 10*3/uL (ref 4.6–13.2)

## 2015-06-16 LAB — METABOLIC PANEL, BASIC
Anion gap: 7 mmol/L (ref 3.0–18)
Anion gap: 10 mmol/L (ref 3.0–18)
BUN/Creatinine ratio: 11 — ABNORMAL LOW (ref 12–20)
BUN/Creatinine ratio: 14 (ref 12–20)
BUN: 20 MG/DL — ABNORMAL HIGH (ref 7.0–18)
BUN: 22 MG/DL — ABNORMAL HIGH (ref 7.0–18)
CO2: 26 mmol/L (ref 21–32)
CO2: 24 mmol/L (ref 21–32)
Calcium: 8.5 MG/DL (ref 8.5–10.1)
Calcium: 8.3 MG/DL — ABNORMAL LOW (ref 8.5–10.1)
Chloride: 110 mmol/L — ABNORMAL HIGH (ref 100–108)
Chloride: 110 mmol/L — ABNORMAL HIGH (ref 100–108)
Creatinine: 1.77 MG/DL — ABNORMAL HIGH (ref 0.6–1.3)
Creatinine: 1.61 MG/DL — ABNORMAL HIGH (ref 0.6–1.3)
GFR est AA: 37 mL/min/{1.73_m2} — ABNORMAL LOW (ref >60)
GFR est AA: 41 mL/min/{1.73_m2} — ABNORMAL LOW (ref >60)
GFR est non-AA: 30 mL/min/{1.73_m2} — ABNORMAL LOW (ref >60)
GFR est non-AA: 34 mL/min/{1.73_m2} — ABNORMAL LOW (ref >60)
Glucose: 101 mg/dL — ABNORMAL HIGH (ref 74–99)
Glucose: 117 mg/dL — ABNORMAL HIGH (ref 74–99)
Potassium: 4 mmol/L (ref 3.5–5.5)
Potassium: 4.1 mmol/L (ref 3.5–5.5)
Sodium: 143 mmol/L (ref 136–145)
Sodium: 144 mmol/L (ref 136–145)

## 2015-06-16 LAB — THROMBIN TIME: Thrombin time: 17.7 SECS (ref 13.8–18.2)

## 2015-06-16 LAB — PROTHROMBIN TIME + INR
INR: 2.8 — ABNORMAL HIGH (ref 0.8–1.2)
INR: 3.6 — ABNORMAL HIGH (ref 0.8–1.2)
INR: 2.6 — ABNORMAL HIGH (ref 0.8–1.2)
Prothrombin time: 27.9 s — ABNORMAL HIGH (ref 11.5–15.2)
Prothrombin time: 34 s — ABNORMAL HIGH (ref 11.5–15.2)
Prothrombin time: 26.8 s — ABNORMAL HIGH (ref 11.5–15.2)

## 2015-06-16 LAB — EKG, 12 LEAD, SUBSEQUENT
Atrial Rate: 71 {beats}/min
Calculated P Axis: 54 degrees
Calculated R Axis: 22 degrees
Calculated T Axis: 6 degrees
Diagnosis: NORMAL
P-R Interval: 122 ms
Q-T Interval: 416 ms
QRS Duration: 100 ms
QTC Calculation (Bezet): 452 ms
Ventricular Rate: 71 {beats}/min

## 2015-06-16 LAB — MAGNESIUM: Magnesium: 1.9 mg/dL (ref 1.8–2.4)

## 2015-06-16 LAB — PTT
aPTT: 49 s — ABNORMAL HIGH (ref 23.0–36.4)
aPTT: 40.5 s — ABNORMAL HIGH (ref 23.0–36.4)

## 2015-06-16 LAB — TYPE & SCREEN
ABO/Rh(D): A POS
Antibody screen: NEGATIVE

## 2015-06-16 LAB — PLATELET FUNCTION ANALYSIS (PFA 100)
PFA COL-ADP: >300 s — ABNORMAL HIGH (ref 52–125)
PFA COL-EPI: >300 s — ABNORMAL HIGH (ref 70–180)

## 2015-06-16 LAB — MIXING STUDY, APTT
aPTT 1:1 mix patient: 45.9 s
aPTT: 52 s

## 2015-06-16 LAB — MIXING STUDY, PT
PT 1:1 mix patient: 21.8 s
Prothrombin time: 31.5 s

## 2015-06-16 LAB — GLUCOSE, POC: Glucose (POC): 178 mg/dL — ABNORMAL HIGH (ref 70–110)

## 2015-06-16 LAB — CK: CK: 101 U/L (ref 26–192)

## 2015-06-16 LAB — TSH 3RD GENERATION: TSH: 1.62 u[IU]/mL (ref 0.36–3.74)

## 2015-06-16 LAB — TYPE AND SCREEN
ABO/Rh: A POS
Antibody Screen: NEGATIVE

## 2015-06-16 MED ORDER — DIPHENHYDRAMINE HCL 50 MG/ML IJ SOLN
50 mg/mL | INTRAMUSCULAR | Status: AC
Start: 2015-06-16 — End: 2015-06-17
  Administered 2015-06-16: 23:00:00

## 2015-06-16 MED ORDER — THROMBIN (BOVINE) 5,000 UNIT TOPICAL SOLUTION
5000 unit | Freq: Once | CUTANEOUS | Status: AC
Start: 2015-06-16 — End: 2015-06-16
  Administered 2015-06-16: 15:00:00 via NASAL

## 2015-06-16 MED ORDER — THROMBIN (BOVINE) 5,000 UNIT TOPICAL SOLUTION
5000 unit | Freq: Once | CUTANEOUS | Status: AC
Start: 2015-06-16 — End: 2015-06-16
  Administered 2015-06-16: 17:00:00 via TOPICAL

## 2015-06-16 MED ORDER — PHYTONADIONE 10 MG/ML INJECTION
10 mg/mL | Freq: Once | INTRAMUSCULAR | Status: AC
Start: 2015-06-16 — End: 2015-06-16
  Administered 2015-06-16: 12:00:00 via INTRAVENOUS

## 2015-06-16 MED ORDER — ZOLPIDEM 5 MG TAB
5 mg | Freq: Once | ORAL | Status: AC
Start: 2015-06-16 — End: 2015-06-15
  Administered 2015-06-16: 03:00:00 via ORAL

## 2015-06-16 MED ORDER — THROMBIN (BOVINE) 5,000 UNIT TOPICAL SOLUTION
5000 unit | CUTANEOUS | Status: AC
Start: 2015-06-16 — End: ?

## 2015-06-16 MED ORDER — SODIUM CHLORIDE 0.65 % NASAL SPRAY AEROSOL
0.65 % | NASAL | Status: DC
Start: 2015-06-16 — End: 2015-06-19
  Administered 2015-06-16 – 2015-06-19 (×12): via NASAL

## 2015-06-16 MED ORDER — CEPHALEXIN 250 MG CAP
250 mg | Freq: Three times a day (TID) | ORAL | Status: DC
Start: 2015-06-16 — End: 2015-06-19
  Administered 2015-06-16 – 2015-06-19 (×8): via ORAL

## 2015-06-16 MED ORDER — OXYMETAZOLINE 0.05 % NASAL SPRAY AEROSOL
0.05 % | Freq: Two times a day (BID) | NASAL | Status: DC | PRN
Start: 2015-06-16 — End: 2015-06-16

## 2015-06-16 MED ORDER — CEPHALEXIN 250 MG CAP
250 mg | ORAL | Status: AC
Start: 2015-06-16 — End: 2015-06-17

## 2015-06-16 MED ORDER — SODIUM CHLORIDE 0.9 % IV
10 mg/mL | Freq: Once | INTRAVENOUS | Status: AC
Start: 2015-06-16 — End: 2015-06-16
  Administered 2015-06-16: 18:00:00 via INTRAVENOUS

## 2015-06-16 MED ORDER — BACITRACIN-POLYMYXIN B 500 UNIT-10,000 UNIT/G OINTMENT
500-10000 unit/gram | Freq: Two times a day (BID) | CUTANEOUS | Status: DC
Start: 2015-06-16 — End: 2015-06-19
  Administered 2015-06-16 – 2015-06-17 (×3): via TOPICAL

## 2015-06-16 MED ORDER — GLUCOSE 4 GRAM CHEWABLE TAB
4 gram | ORAL | Status: DC | PRN
Start: 2015-06-16 — End: 2015-06-19

## 2015-06-16 MED ORDER — HYDROMORPHONE 2 MG/ML INJECTION SOLUTION
2 mg/mL | INTRAMUSCULAR | Status: AC
Start: 2015-06-16 — End: 2015-06-16

## 2015-06-16 MED ORDER — INSULIN LISPRO 100 UNIT/ML INJECTION
100 unit/mL | Freq: Four times a day (QID) | SUBCUTANEOUS | Status: DC
Start: 2015-06-16 — End: 2015-06-19
  Administered 2015-06-16 – 2015-06-17 (×2): via SUBCUTANEOUS

## 2015-06-16 MED ORDER — DEXTROSE 50% IN WATER (D50W) IV SYRG
INTRAVENOUS | Status: DC | PRN
Start: 2015-06-16 — End: 2015-06-19
  Administered 2015-06-18: 12:00:00 via INTRAVENOUS

## 2015-06-16 MED ORDER — SODIUM CHLORIDE 0.9 % IV
INTRAVENOUS | Status: DC | PRN
Start: 2015-06-16 — End: 2015-06-19

## 2015-06-16 MED ORDER — PANTOPRAZOLE 40 MG IV SOLR
40 mg | Freq: Two times a day (BID) | INTRAVENOUS | Status: DC
Start: 2015-06-16 — End: 2015-06-18
  Administered 2015-06-16 – 2015-06-18 (×5): via INTRAVENOUS

## 2015-06-16 MED ORDER — DIPHENHYDRAMINE HCL 50 MG/ML IJ SOLN
50 mg/mL | Freq: Four times a day (QID) | INTRAMUSCULAR | Status: DC | PRN
Start: 2015-06-16 — End: 2015-06-19
  Administered 2015-06-16 – 2015-06-19 (×6): via INTRAVENOUS

## 2015-06-16 MED ORDER — DIPHENHYDRAMINE HCL 50 MG/ML IJ SOLN
50 mg/mL | Freq: Once | INTRAMUSCULAR | Status: AC
Start: 2015-06-16 — End: 2015-06-16
  Administered 2015-06-16: 20:00:00 via INTRAVENOUS

## 2015-06-16 MED ORDER — HYDROMORPHONE (PF) 1 MG/ML IJ SOLN
1 mg/mL | INTRAMUSCULAR | Status: AC
Start: 2015-06-16 — End: 2015-06-17

## 2015-06-16 MED ORDER — HYDROMORPHONE (PF) 1 MG/ML IJ SOLN
1 mg/mL | INTRAMUSCULAR | Status: DC | PRN
Start: 2015-06-16 — End: 2015-06-19
  Administered 2015-06-16 – 2015-06-19 (×18): via INTRAVENOUS

## 2015-06-16 MED ORDER — DIPHENHYDRAMINE HCL 50 MG/ML IJ SOLN
50 mg/mL | Freq: Once | INTRAMUSCULAR | Status: AC
Start: 2015-06-16 — End: 2015-06-16
  Administered 2015-06-16: 22:00:00 via INTRAVENOUS

## 2015-06-16 MED ORDER — LISINOPRIL 20 MG TAB
20 mg | Freq: Every day | ORAL | Status: DC
Start: 2015-06-16 — End: 2015-06-19
  Administered 2015-06-16 – 2015-06-19 (×3): via ORAL

## 2015-06-16 MED ORDER — DIPHENHYDRAMINE HCL 50 MG/ML IJ SOLN
50 mg/mL | INTRAMUSCULAR | Status: AC
Start: 2015-06-16 — End: 2015-06-16

## 2015-06-16 MED ORDER — OXYMETAZOLINE 0.05 % NASAL SPRAY AEROSOL
0.05 % | Freq: Two times a day (BID) | NASAL | Status: DC
Start: 2015-06-16 — End: 2015-06-19
  Administered 2015-06-16 – 2015-06-17 (×3): via NASAL

## 2015-06-16 MED ORDER — OXYMETAZOLINE 0.05 % NASAL SPRAY AEROSOL
0.05 % | Freq: Once | NASAL | Status: DC
Start: 2015-06-16 — End: 2015-06-16
  Administered 2015-06-16: 18:00:00 via NASAL

## 2015-06-16 MED ORDER — GLUCAGON 1 MG INJECTION
1 mg | INTRAMUSCULAR | Status: DC | PRN
Start: 2015-06-16 — End: 2015-06-19

## 2015-06-16 MED ORDER — DIPHENHYDRAMINE HCL 50 MG/ML IJ SOLN
50 mg/mL | INTRAMUSCULAR | Status: AC
Start: 2015-06-16 — End: 2015-06-17

## 2015-06-16 MED ORDER — GELATIN ADSORBABLE 100 TOPICAL SPONGE
100 | CUTANEOUS | Status: AC
Start: 2015-06-16 — End: ?

## 2015-06-16 MED ORDER — LISINOPRIL 20 MG TAB
20 mg | ORAL | Status: AC
Start: 2015-06-16 — End: 2015-06-17

## 2015-06-16 MED ORDER — METHYLPREDNISOLONE (PF) 40 MG/ML IJ SOLR
40 mg/mL | Freq: Once | INTRAMUSCULAR | Status: AC
Start: 2015-06-16 — End: 2015-06-16
  Administered 2015-06-16: 20:00:00 via INTRAVENOUS

## 2015-06-16 MED ORDER — METHYLPREDNISOLONE (PF) 40 MG/ML IJ SOLR
40 mg/mL | INTRAMUSCULAR | Status: AC
Start: 2015-06-16 — End: 2015-06-17

## 2015-06-16 MED ORDER — AMLODIPINE 5 MG TAB
5 mg | ORAL | Status: AC
Start: 2015-06-16 — End: 2015-06-16

## 2015-06-16 MED FILL — HYDROXYCHLOROQUINE 200 MG TAB: 200 mg | ORAL | Qty: 1

## 2015-06-16 MED FILL — THROMBIN-JMI 5,000 UNIT TOPICAL SOLUTION: 5000 unit | CUTANEOUS | Qty: 1

## 2015-06-16 MED FILL — HYDROMORPHONE 2 MG/ML INJECTION SOLUTION: 2 mg/mL | INTRAMUSCULAR | Qty: 1

## 2015-06-16 MED FILL — CLONIDINE 0.1 MG TAB: 0.1 mg | ORAL | Qty: 2

## 2015-06-16 MED FILL — NASAL DECONGESTANT (OXYMETAZOLINE) 0.05 % SPRAY: 0.05 % | NASAL | Qty: 30

## 2015-06-16 MED FILL — BUDESONIDE 0.5 MG/2 ML NEB SUSPENSION: 0.5 mg/2 mL | RESPIRATORY_TRACT | Qty: 1

## 2015-06-16 MED FILL — BACITRACIN-POLYMYXIN B 500 UNIT-10,000 UNIT/G OINTMENT: 500-10000 unit/gram | CUTANEOUS | Qty: 14.17

## 2015-06-16 MED FILL — BROVANA 15 MCG/2 ML SOLUTION FOR NEBULIZATION: 15 mcg/2 mL | RESPIRATORY_TRACT | Qty: 2

## 2015-06-16 MED FILL — SODIUM CHLORIDE 0.9 % IV: INTRAVENOUS | Qty: 250

## 2015-06-16 MED FILL — LISINOPRIL 20 MG TAB: 20 mg | ORAL | Qty: 1

## 2015-06-16 MED FILL — SOLU-MEDROL (PF) 40 MG/ML SOLUTION FOR INJECTION: 40 mg/mL | INTRAMUSCULAR | Qty: 1

## 2015-06-16 MED FILL — DIPHENHYDRAMINE HCL 50 MG/ML IJ SOLN: 50 mg/mL | INTRAMUSCULAR | Qty: 1

## 2015-06-16 MED FILL — DEEP SEA NASAL 0.65 % SPRAY AEROSOL: 0.65 % | NASAL | Qty: 44

## 2015-06-16 MED FILL — GABAPENTIN 300 MG CAP: 300 mg | ORAL | Qty: 1

## 2015-06-16 MED FILL — METHYLPREDNISOLONE 4 MG TAB: 4 mg | ORAL | Qty: 3

## 2015-06-16 MED FILL — VITAMIN K1 10 MG/ML INJECTION SOLUTION: 10 mg/mL | INTRAMUSCULAR | Qty: 1

## 2015-06-16 MED FILL — ZOSYN 3.375 GRAM INTRAVENOUS SOLUTION: 3.375 gram | INTRAVENOUS | Qty: 3.38

## 2015-06-16 MED FILL — HYDROMORPHONE (PF) 1 MG/ML IJ SOLN: 1 mg/mL | INTRAMUSCULAR | Qty: 1

## 2015-06-16 MED FILL — ONDANSETRON (PF) 4 MG/2 ML INJECTION: 4 mg/2 mL | INTRAMUSCULAR | Qty: 2

## 2015-06-16 MED FILL — OXYCODONE-ACETAMINOPHEN 5 MG-325 MG TAB: 5-325 mg | ORAL | Qty: 1

## 2015-06-16 MED FILL — CEPHALEXIN 250 MG CAP: 250 mg | ORAL | Qty: 1

## 2015-06-16 MED FILL — ZOLPIDEM 5 MG TAB: 5 mg | ORAL | Qty: 2

## 2015-06-16 MED FILL — AMLODIPINE 5 MG TAB: 5 mg | ORAL | Qty: 2

## 2015-06-16 MED FILL — MONTELUKAST 10 MG TAB: 10 mg | ORAL | Qty: 1

## 2015-06-16 MED FILL — FERROUS SULFATE 325 MG (65 MG ELEMENTAL IRON) TAB: 325 mg (65 mg iron) | ORAL | Qty: 1

## 2015-06-16 MED FILL — VITAMIN K1 10 MG/ML INJECTION SOLUTION: 10 mg/mL | INTRAMUSCULAR | Qty: 0.5

## 2015-06-16 MED FILL — GELFOAM SPONGE SIZE 100 100: 100 | CUTANEOUS | Qty: 1

## 2015-06-16 MED FILL — PROTONIX 40 MG INTRAVENOUS SOLUTION: 40 mg | INTRAVENOUS | Qty: 40

## 2015-06-16 MED FILL — MEPHYTON 5 MG TABLET: 5 mg | ORAL | Qty: 2

## 2015-06-16 MED FILL — THERA 400 MCG TABLET: 400 mcg | ORAL | Qty: 1

## 2015-06-16 NOTE — Other (Signed)
NUTRITION [facility]         SUBJECTIVE/OBJECTIVE:     Patient Active Problem List   Diagnosis Code   ??? GI bleed K92.2   ??? 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 History of pulmonary embolism Z86.711   ??? Lupus (HCC) M32.9   ??? History of DVT (deep vein thrombosis) Z86.718   ??? Coagulopathy (HCC) D68.9   ??? Hematuria R31.9   ??? UTI (urinary tract infection) N39.0   Diet:   To resume regular diet  PO Intake:     Good           Fair           Poor   Patient Vitals for the past 100 hrs:   % Diet Eaten   06/15/15 1855 100 %   Ht 66" Wt 272 lbs Ideal Wt 130-140 lbs.    Problems Identified:        Poor appetite     Specified food preferences      Dislikes/ needs supplements                 Allergies     Difficulty chewing        Dentition      Nausea/Vomiting     Constipation     Diarrhea     Post epistaxis- cold items recommended    RD RECOMMENDATIONS/PLAN:        Obtained/adjusted food preferences/tolerances and/or snacks options      Add supplements to increase intake     Dislikes supplements will try a substitution      Modify diet for food allergies     Adjust texture due to difficulty chewing      Educated patient        None      Delila Spence, RD,

## 2015-06-16 NOTE — Progress Notes (Signed)
Orders received and chart reviewed.  Patient moved to ICU and will hold PT for today will attempt tomorrow.    Rogelia RohrerJanice Apryll Hinkle PT

## 2015-06-16 NOTE — Consults (Addendum)
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 results for input(s): CULT in the last 72 hours.     EKG  No results found for this or any previous visit.     PFT  No flowsheet data found.       Ultrasound       LE Doppler       ECHO  No results found for this or any previous visit.     CT (Most Recent)    Results from Hospital Encounter encounter on 06/15/15   CT ABD PELV WO CONT   Narrative EXAM: CT of the abdomen and pelvis    INDICATION: 49 year old patient with coagulopathy and bleeding. Evaluation for  retroperitoneal hematoma is requested.    COMPARISON: 06/15/2015    TECHNIQUE: Axial CT imaging of the abdomen and pelvis was performed without  intravenous contrast. Multiplanar reformats were generated.    One or more dose reduction techniques were used on this CT: automated exposure  control, adjustment of the mAs and/or kVp according to patient's size, and  iterative reconstruction techniques. The specific techniques utilized on this CT  exam have been documented in the patient's electronic medical record.     _______________    FINDINGS:    LOWER CHEST: Images through the lower chest demonstrate subsegmental atelectasis  at the right lung base without focal pneumonic consolidation, pneumothorax, or  pleural effusion. Central venous catheter noted with in the right atrium.   Cardiac size normal. No pericardial effusion. There is partial inclusion of an  oval right breast mass (series 3, image 14) which measures 2.5 x 2.0 cm in size.    LIVER, BILIARY: The unenhanced appearance of the liver demonstrates no focal  abnormality. Minimal intrahepatic and extrahepatic biliary prominence noted,  stable from prior, with postsurgical changes of cholecystectomy noted.        PANCREAS: Normal.     SPLEEN: Normal.    ADRENALS: Normal.    KIDNEYS/URETERS/BLADDER: No hydronephrosis or nephrolithiasis present in either  kidney. Increased attenuation of the bladder noted.    PELVIC ORGANS: The uterus is surgically absent.    VASCULATURE: Mild aortobiiliac atherosclerotic aneurysmal dilatation. An  inferior vena cava filter is present below level of the renal veins.    LYMPH NODES: Scattered subcentimeter mesenteric and retroperitoneal lymph nodes  unchanged. Prominent right external iliac and right obturator lymph nodes  unchanged from prior.    GASTROINTESTINAL TRACT: The stomach, small intestine, and large intestine are  normal in course and caliber. There is no evidence of bowel obstruction or  perforation.        BONES: Post surgical changes of Girdlestone resection arthroplasty noted  involving the right hip joint. No soft tissue gas identified within the  postoperative joint space. As before, stranding along the surgical incision  noted, and incompletely included within the imaged volume. Posterior to the  residual proximal right femur, increased soft tissue/fluid density is present  (series 3, image 109), which when correlating sagittal imaging appears similar  to prior, and is likely reflective of differences in leg position. 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 Vertebral body hemangiomata present  in T12 and L2.      OTHER: The retroperitoneal fat is normal in appearance. There is no evidence of   retroperitoneal hematoma present. Included soft tissues of the proximal thighs  demonstrate no new or acute fluid collection.    _______________         Impression IMPRESSION:    1.  No evidence of retroperitoneal hematoma.  2. Mildly increased attenuation of the bladder, homogeneous in appearance, most  likely reflective of excreted iodinated contrast from prior CT examination.  Conceivably, given the patient's history of coagulopathy, blood products could  appear similarly. No hydronephrosis identified. Correlation clinically  recommended.  3. 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  4. Partial inclusion of a right breast mass. Correlation with prior mammographic  evaluation recommended. If no prior imaging is available for comparison, further  evaluation with diagnostic mammography is recommended when the patient is  clinically able.               XR (Most Recent). CXR  reviewed by me and compared with previous CXR   Results from Hospital Encounter encounter on 06/15/15   XR CHEST PORT   Narrative History: Chest pain, shortness of breath    Comparison 05/05/2014    FINDINGS: AP portable upright chest obtained. Right IJ tunneled Mediport  catheter remains in place with its tip in the upper right atrium. There are  lines likely external to the chest across the mid chest. Heart size likely upper  limits of normal. No evidence of new consolidation mass pleural effusion or  pneumothorax. Degenerative multilevel thoracic spondylosis. Persistent abnormal  sclerosis and flattening of the humeral heads suggestive of AVN.         Impression IMPRESSION:    1. No evidence of new active cardiopulmonary disease or significant interval  change.    2. Suggestion of AVN bilateral humeral heads.                 The patient is:  acutely ill Risk of deterioration:  moderate     critically ill   high     See my orders for details     My assessment, plan of care, findings, medications, side effects etc were discussed with:   Nurse  PT/OT     Respiratory therapy  ENT surgeon at bedside Dr. Wilhemina Cash, GI at bedside. Dr. Linwood Dibbles.      Pharmacist  MDR    Family: answered all questions to satisfaction  Patient: answered all questions to satisfaction            Case & management strategies discussed today on multidisciplinary rounds    High complexity decision making was performed during the evaluation of this patient at high risk for decompensation with multiple organ involvement      Lorie Phenix, MD  06/16/2015

## 2015-06-16 NOTE — Progress Notes (Signed)
Pt not seen for skilled OT due to:    Nausea/vomiting    Eating    Pain    Pt lethargic    Hold today per nursing   Will f/u later as schedule allows. Thank you.  Theresa MulliganAmanda Harrison, MS OTR/L

## 2015-06-16 NOTE — Progress Notes (Signed)
Gastrointestinal Progress Note    Patient Name: Sydney Luna    ZOXWR'U Date: 06/16/2015    Admit Date: 06/15/2015      Assessment:   1. Coagulopathy  2. Hematochezia  3. Hematuria  4. R thigh hematoma  5. Epistaxis  6. Abd pain  7. H/o DVT/PE and IVC filter  8. SLE    Development of epistaxis this AM; INR down to 2.8 with Vit K but back to 3.6; now in ICU. Her hematochezia is likely a byproduct of the underlying coagulopathy along with bleeding in urine/leg/nose. Do not see compelling reason to pursue endoscopic evaluation at this time in the setting of her coagulopathy provided GI bleeding remains relatively small volume as it has been. Goal is to correct the underlying coagulopathy.    Recommendation:   1. Continue protonix; OK with bid only; no evidence of UGIB  2. Hematology consultation  3. Will follow; no endoscopic intervention for now      Subjective:      [name] developed persistent epistaxis this morning along with worsening lower abd pain and was transferred to the ICU. Repeat CT shows no retroperitoneal hematoma. Of note, she has a known h/o abd pain with negative w/u in the past. INR was down to 2.8 last night, but back to 3.6 this morning; plts 362; Hb stable at 9.9.    CT Impression this AM:  CT A/P:  IMPRESSION:  ??  1. No evidence of retroperitoneal hematoma.  2. Mildly increased attenuation of the bladder, homogeneous in appearance, most  likely reflective of excreted iodinated contrast from prior CT examination.  Conceivably, given the patient's history of coagulopathy, blood products could  appear similarly. No hydronephrosis identified. Correlation clinically  recommended.  3. 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  4. Partial inclusion of a right breast mass. Correlation with prior mammographic  evaluation recommended. If no prior imaging is available for comparison, further   evaluation with diagnostic mammography is recommended when the patient is  clinically able.      Current Facility-Administered Medications   Medication Dose Route Frequency   ??? oxymetazoline (AFRIN) 0.05 % nasal spray 2 Spray  2 Spray Left Nostril BID PRN   ??? HYDROmorphone (PF) (DILAUDID) injection 0.5 mg  0.5 mg IntraVENous Q4H PRN   ??? thrombin (bovine) (THROMBIN-JMI) 5,000 unit topical solution   Both Nostrils ONCE   ??? 0.9% sodium chloride infusion 250 mL  250 mL IntraVENous PRN   ??? pantoprazole (PROTONIX) 40 mg in 0.9% sodium chloride (MBP/ADV) 50 mL MBP  8 mg/hr IntraVENous CONTINUOUS   ??? 0.9% sodium chloride infusion  100 mL/hr IntraVENous CONTINUOUS   ??? piperacillin-tazobactam (ZOSYN) 3.375 g in 0.9% sodium chloride (MBP/ADV) 100 mL MBP  3.375 g IntraVENous Q6H   ??? acetaminophen (TYLENOL) tablet 650 mg  650 mg Oral Q4H PRN   ??? ondansetron (ZOFRAN) injection 4 mg  4 mg IntraVENous Q4H PRN   ??? albuterol (PROVENTIL VENTOLIN) nebulizer solution 2.5 mg  2.5 mg Nebulization Q4H PRN   ??? amLODIPine (NORVASC) tablet 10 mg  10 mg Oral DAILY   ??? cloNIDine HCl (CATAPRES) tablet 0.2 mg  0.2 mg Oral BID   ??? ferrous sulfate tablet 325 mg  325 mg Oral BID   ??? gabapentin (NEURONTIN) capsule 300 mg  300 mg Oral TID   ??? hydroxychloroquine (PLAQUENIL) tablet 200 mg  200 mg Oral DAILY   ??? methylPREDNISolone (MEDROL) tablet 12 mg  12 mg Oral DAILY WITH BREAKFAST   ??? montelukast (SINGULAIR) tablet 10 mg  10 mg Oral DAILY   ??? oxyCODONE-acetaminophen (PERCOCET) 5-325 mg per tablet 1 Tab  1 Tab Oral Q4H PRN   ??? therapeutic multivitamin (THERAGRAN) tablet 1 Tab  1 Tab Oral DAILY   ??? budesonide (PULMICORT) 500 mcg/2 ml nebulizer suspension  500 mcg Nebulization BID RT   ??? arformoterol (BROVANA) neb solution 15 mcg  15 mcg Nebulization BID RT          Objective:     Physical Exam:    Visit Vitals   ??? BP (!) 162/105   ??? Pulse 94   ??? Temp 98.5 ??F (36.9 ??C)   ??? Resp 18   ??? Ht 5\' 6"  (1.676 m)   ??? Wt 123.5 kg (272 lb 3.2 oz)   ??? SpO2 100%    ??? BMI 43.93 kg/m2     General appearance: alert, cooperative, no distress, appears stated age  Nose: patient and nurse holding pressure across bridge of nose; ongoing epistaxis  Lungs: clear to auscultation bilaterally  Heart: regular rate and rhythm, S1, S2 normal, no murmur, click, rub or gallop  Abdomen: soft, diffuse mild lower abd tenderness without rebound/guarding. Bowel sounds normal. No masses,  no organomegaly    Data Review:    Labs: Results:       Chemistry Recent Labs      06/16/15   0625  06/15/15   2333  06/15/15   1130   GLU  117*  101*  114*   NA  144  143  143   K  4.1  4.0  3.4*   CL  110*  110*  112*   CO2  24  26  19*   BUN  22*  20*  20*   CREA  1.61*  1.77*  1.24   CA  8.3*  8.5  8.0*   AGAP  10  7  12    BUCR  14  11*  16   AP   --    --   100   TP   --    --   7.7   ALB   --    --   3.3*   GLOB   --    --   4.4*   AGRAT   --    --   0.8      CBC w/Diff Recent Labs      06/16/15   0625  06/15/15   2333  06/15/15   1755   06/15/15   1130   WBC  11.2  11.3   --    --   10.7   RBC  3.94*  3.86*   --    --   4.22   HGB  9.9*  9.6*  10.9*   < >  10.6*   HCT  30.9*  30.2*  33.6*   < >  32.9*   PLT  362  341   --    --   388   GRANS  65  66   --    --   67   LYMPH  29  27   --    --   28   EOS  2  2   --    --   2    < > = values in this interval not displayed.      Coagulation Recent Labs  06/16/15   0625  06/15/15   2333  06/15/15   1755   PTP  34.0*  27.9*  36.5*   INR  3.6*  2.8*  4.0*   APTT   --   49.0*  48.2*       Liver Enzymes Recent Labs      06/15/15   1130   TP  7.7   ALB  3.3*   AP  100   SGOT  13*   ALT  18            Huntley Dec, MD  June 16, 2015

## 2015-06-16 NOTE — Progress Notes (Signed)
Patient moved to ICU this am will check back later today.  Rogelia Rohrer PT

## 2015-06-16 NOTE — ACP (Advance Care Planning) (Signed)
Patient has designated ___friend_____________________ to participate in his/her discharge plan and to receive any needed information.     Name: Sydney Luna  Address:same as pt  Phone number:[phone]

## 2015-06-16 NOTE — Other (Addendum)
2328 Bedside shift change report given to carolyn ramey rn (oncoming nurse) by clre rn (offgoing nurse). Report included the following information SBAR, Kardex and Recent Results. Side rails up x 3, call light within reach.  0000 assessment completed, oral care provided. Pt given fresh ice water to drink, only taking sips.  0017 c/o pan right hp amd uper right thigh, tylenol 650 mg given and repositioned in bed.  0018 benadryl 12.5 mg goven for c/o itching back, noted small red rash lower back and bilat flank area.  0100 repositioned in bed for pain.  0124 pain 8/10 dilaudid 0.5 mg given, 2mg  pulled pyxis on back up had to pull on override, clare rn verified waste.  0200 c/o nausea zofran 4mg  given iv.  0230 resting in bed states nausea better but pain remain at acceptable level never goes away.  0400 assessment completed, resting in bed.  0600 asleep.  0730 Bedside shift change report given to vicki villeges rn (oncoming nurse) by c.ramey rn (offgoing nurse). Report included the following information SBAR, Kardex and Recent Results. Side rails up x 3, call light within reach. Pt states pt at acceptable level. DR KOH hem/onc in visiting/assessing pt. Vicky rn aware 0800 lab draws.

## 2015-06-16 NOTE — Other (Signed)
[facility]   Discharge Planning/Social Services Assessment    Reasons for Intervention:   Interviewed patient. Verified demographics listed on face sheet with patient; all information correct. Patient stated their PCP is Dr Jake Bathe and last appt past week. Patient lives in apartment with roommate . Patient's NOK : refused. Chooses to have roommate/friend, Thomasene Lot as contact, [phone]. Patient dependent with ADLs prior to admission. Patient has  DME prior to admission: rolling walker, shower chair,has wheelchair available, but does not need.Marland Kitchen Discharge plan is Home with previous services when medically ready  Orson Gear RN BSN  Outcomes Manager  Pager # [phone]  High Risk Criteria   Yes  No   Physician Referral   Yes  No        Date    Nursing Referral   Yes  No        Date    Patient/Family Request   Yes  No        Date       Resources:    Medicare   Yes  No   Medicaid   Yes  No   No Resources   Yes  No   Private Insurance   Yes  No   Case Manager Name/Phone Number    Other   Yes  No        (i.e. Workman's Comp)         Prior Services:    Prior Services   Yes  No   Home Health   Yes  No   [facility]   Private Home Care   Yes  No        Number of Hours    Home Care Program   Yes  No   Case Manager    Meals on Wheels   Yes  No   Office on Aging   Yes  No   Transportation Services   Yes  No   Nursing Home   Yes  No        Nursing Home Name    Rehab/VA Hospital   Yes  No        Rehab/VA Name    Other       Information Source:      Information obtained from   Patient   Parent    Guardian   Child   Spouse    Significant Other/Partner    Friend       EMS     Nursing Home Chart           Other:   Chart Review   Yes  No     Family/Support System:    Patient lives with   Alone     Spouse    Significant Other   Children   Caretaker    Parent   Sibling      Other       Other Support System:    Is the patient responsible for care of others   Yes  No    Information of person caring for patient on  discharge    Managers financial affairs independently   Yes  No   If no, explain:      Status Prior to Admission:    Mental Status   Awake   Alert   Oriented   Quiet/Calm  Lethargic/Sedated    Disoriented   Restless/Anxious   Combative   Personal Care   Dependent  Independent Personal Care   Requires Assistance   Meal Preparation Ability   Independent    Standby Assistance    Minimal Assistance    Moderate Assistance   Maximum Assistance      Total Assistance   Chores   Independent with Chores    N/A Nursing Home Resident    Requires Assistance   Bowel/Bladder   Continent   Catheter   Incontinent   Ostomy Self-Care     Urine Diversion Self-Care   Maximum Assistance      Total Assistance   Number of Persons needed for assistance    DME at home   [place], Stephannie Peters, Straight    Commode     Bathroom/Grab [facility] Bed   Nebulizer   Oxygen            Raised Toilet Seat   Shower Chair   Side Rails for Bed    Tub Transfer Bench    Walker, Retail buyer, Standard       Other:   Vendor      Treatment Presently Receiving:    Current Treatments   Chemotherapy   Dialysis   Insulin   IVAB  IVF    O2   PCA    PT    RT    Tube Feedings    Wound Care     Psychosocial Evaluation:    Verbalized Knowledge of Disease Process   Patient  Family   Coping with Disease Process   Patient  Family   Requires Further Counseling Coping with Disease Process   Patient  Family     Identified Projected Needs:    Home Health Aid   Yes  No   Transportation   Yes  No   Education   Yes  No        Specific Education     Financial Counseling   Yes  No   Inability to Care for Self/Will Require 24 hour care   Yes  No   Pain Management   Yes  No   Home Infusion Therapy   Yes  No   Oxygen Therapy   Yes  No   DME   Yes  No   Long Term Care Placement   Yes  No   Rehab   Yes  No   Physical Therapy   Yes  No   Needs Anticipated At This Time   Yes  No     Intra-Hospital Referral:     Home Health Liasion   Yes  No   Life Coach   Yes  No   Patient Representative   Yes  No   Staff for Teaching Needs   Yes  No   Specialty Teaching Needs     Diabetic Educator   Yes  No   Referral for Diabetic Educator Needed   Yes  No  If Yes, place order for Nutritionist or Diabetic Consult     Tentative Discharge Plan:    Home with No Services   Yes  No   Home with Home Health Follow-up   Yes  No        If Yes, specify type    Home Care Program   Yes  No        If Yes, specify type Previous service   Meals on Wheels   Yes  No   Office of Aging   Yes  No  NHP   Yes  No   Return to the Nursing Home   Yes  No   Rehab Therapy   Yes  No   Acute Rehab   Yes  No   Subacute Rehab   Yes  No   Private Care   Yes  No   Substance Abuse Referral   Yes  No   Transportation   Yes  No   Chore Service   Yes  No   Inpatient Hospice   Yes  No   OP RT   Yes   No   OP Hemo   Yes   No   OP PT   Yes  No   Support Group   Yes  No   Reach to Recovery   Yes  No   OP Oncology Clinic   Yes  No   Clinic Appointment   Yes  No   DME   Yes  No   Comments    Name of D/C Planner or Social Worker Given to Patient or Family Orson Gear RN BSN  Outcomes Manager  Pager # [phone]   Phone Number         Extension    Date 06/16/2015   Time 1430   If you are discharged home, whom do you designate to participate in your discharge plan and receive any information needed?     Enter name of designee friend        Phone # of designee         Address of designee         Updated         Patient refused to designate any           individual

## 2015-06-16 NOTE — Progress Notes (Signed)
D/w Dr. Weber Cooks and reviewed labs and clinical hx, who will see patient today.     Mixing study PT:  Prothrombin time 31.5   sec Final   PT 1:1 mix patient 21.8   sec Final     Mixing study APTT:  aPTT 52.0   sec Final   Comment:   Testing Performed by:   [facility]   [address]   [place], [place]      aPTT 1:1 mix patient 45.9   sec Final         Will wait for Dr. Weber Cooks to decide which factors activity to prioritize in lab work as there is not enough plasma collected before giving FFP this am. Lab has stored this sample and waiting for ICU / Dr. Weber Cooks to notify.     Karis Juba, MD 06/16/2015 4:38 PM

## 2015-06-16 NOTE — Progress Notes (Addendum)
0981 Admitted pt to ICU from 3 [place].  C/o Epistaxis.  Pt is alert, oriented.  With 3  [place] Pam at bedside putting pressure on pt's nose.  Pt has Mediport on Rt SC. Blood draws for lab done.  0945 Transfusion of FFP started.  1100 2nd unit of FFP completed.  1150 C/o itching and breaking out hives.  PA [place] examined pt. Will continue to monitor pt per PA.  Benadryl 12.5 mg IVP given.  1300 EENT at bedside.  Blood bank Personnel called and stated that blood tx reaction work up not needed for hives just treat it with Benadryl.  1640 3rd unit of FFP started.  Pt is premedicated with benadryl IV and Methylprednisolone IV given.  1730 4th unit of FFP started.  1840 Complained again of hives on lower extremities and thigh area.PA Haynes Kerns made aware. Another dose of Benadryl 12.5 mg given IVP.  1900 Pt voiced no complaint. Transfusion completed.  1920 Bedside and Verbal shift change report given to Lowella Dandy RN (oncoming nurse) by Gaetano Net, RN   (offgoing nurse). Report included the following information SBAR, Kardex, Intake/Output, MAR, Recent Results and Cardiac Rhythm SR.

## 2015-06-16 NOTE — Progress Notes (Addendum)
1920-assessment complete, received pt in bed, awake alert and oriented, VSS, denies any co pain, call bell in reach, will monitor    2108-Dilaudid 0.5mg  IV given for c/o lower abd pain, NS up at 100cc/hr, per order    2200-pt resting quietly, no distress noted, will monitor    2300-  Bedside and Verbal shift change report given to [place] Poliarolyn Ramey, RN (oncoming nurse) by Mallory ShirkLARE M ROSENKRANTZ, RN   (offgoing nurse). Report included the following information MAR, [place], Recent Results, Med Rec Status and Cardiac Rhythm SR.

## 2015-06-16 NOTE — Progress Notes (Addendum)
[facility]  Hospitalist Division    Daily progress Note    Patient: Sydney Luna MRN: [id number]  CSN: [id number]    Date of Birth: [birth year]  Age: 49 y.o.  Sex: female    DOA: 06/15/2015 LOS:  LOS: 1 day                    Interval Events:     Developed nose bleed this am and transferred to the ICU. Also had some oozing from her Mediport site and from her umbilicus.   INR trending back up this am to 3.6    Assessment/Plan:     Chief Complaint:  Blood in urine and stool. Thigh hematoma    Patient Active Problem List   Diagnosis Code   ??? GI bleed K92.2   ??? 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 History of pulmonary embolism Z86.711   ??? Lupus ([place]) M32.9   ??? History of DVT (deep vein thrombosis) Z86.718   ??? Coagulopathy (HCC) D68.9   ??? Hematuria R31.9   ??? UTI (urinary tract infection) N39.0       A/P:  - Coagulopathy with INR of 5.6 and ptt of 80 - Primary problem seems to be coagulopathy. Four doses Vitamin K given thus far. Mixing study sent and checking multiple coagulation factors. Transfusing FFP. Heme/ Onc has been consulted.   - GI Bleed - GI following. Continue Protonix gtt. Monitor serial H&H.   - Hematuria with possible UTI - Continue Zosyn. Follow urine culture  - Epistaxis - May need to consult ENT. Correct coagulopathy first.   - Thigh Hematoma  - AKI - Continue IV fluids and monitor labs. Stop Lisinopril for now  - Asthma - continue Albuterol PRN/ Symbicort  - HTN - Continue Clonidine/ Norvasc  - Lupus - Continue Plaquenil/ Steroids  - Hx of GI Bleed - Appears to be the reason she was taken off of anticoagulation  - Hx of multiple PE's, DVT's, and an IVC Filter  - Obesity       Activity: OOB and Mobilize as able  DVT Prophylaxis:  SCD's  GI Prophylaxis: Protonix  Care plan discussed    Subjective:      She had some abdominal pain overnight. Notes oozing from multiple areas overnight. Feeling better this morning. Denies any chest pain or shortness of breath.      Objective:      Visit Vitals   ??? BP 170/73 (BP 1 Location: Left arm, BP Patient Position: Sitting)   ??? Pulse 89   ??? Temp 97.9 ??F (36.6 ??C)   ??? Resp 18   ??? Ht '5\' 6"'  (1.676 m)   ??? Wt 123.5 kg (272 lb 3.2 oz)   ??? SpO2 98%   ??? BMI 43.93 kg/m2       Physical Exam:  Gen: In general, this is an obese female, in no acute distress on RA.  HEENT: Sclerae nonicteric. Oral mucous membranes moist. Holding pressure to nasal bridge  Neck: Supple with midline trachea. ??  CV: RRR without murmur or rub appreciated. ??  Resp:Respirations are unlabored without use of accessory muscles. Lung fields bilaterally without wheezes or rhonchi.  Abd: Soft, nontender, nondistended.  Extrem: Extremities are warm, without cyanosis or clubbing. No pitting pretibial edema. Palpable distal pulses X 4. ??  Skin: Warm, no visible rashes. + hematoma to right lateral thigh with small area of central oozing. Tender to palpation.   Neuro: Patient is  alert, oriented, and cooperative. No obvious focal defects. Moves all 4 extremities    Intake and Output:  Current Shift:  08/16 0701 - 08/16 1900  In: -   Out: 400 [Urine:400]  Last three shifts:  08/14 1901 - 08/16 0700  In: 1459.8 [P.O.:1200; I.V.:259.8]  Out: 1700 [Urine:1700]    Recent Results (from the past 24 hour(s))   TYPE & SCREEN    Collection Time: 06/15/15 11:00 AM   Result Value Ref Range    Crossmatch Expiration 06/18/2015     ABO/Rh(D) A POSITIVE     Antibody screen NEG    URINALYSIS W/ RFLX MICROSCOPIC    Collection Time: 06/15/15 11:30 AM   Result Value Ref Range    Color YELLOW      Appearance CLOUDY      Specific gravity 1.020 1.005 - 1.030      pH (UA) 5.5 5.0 - 8.0      Protein 100 (A) NEG mg/dL    Glucose NEGATIVE  NEG mg/dL    Ketone NEGATIVE  NEG mg/dL    Bilirubin NEGATIVE  NEG      Blood LARGE (A) NEG      Urobilinogen 0.2 0.2 - 1.0 EU/dL    Nitrites NEGATIVE  NEG      Leukocyte Esterase SMALL (A) NEG     CBC WITH AUTOMATED DIFF    Collection Time: 06/15/15 11:30 AM    Result Value Ref Range    WBC 10.7 4.6 - 13.2 K/uL    RBC 4.22 4.20 - 5.30 M/uL    HGB 10.6 (L) 12.0 - 16.0 g/dL    HCT 32.9 (L) 35.0 - 45.0 %    MCV 78.0 74.0 - 97.0 FL    MCH 25.1 24.0 - 34.0 PG    MCHC 32.2 31.0 - 37.0 g/dL    RDW 16.9 (H) 11.6 - 14.5 %    PLATELET 388 135 - 420 K/uL    MPV 9.2 9.2 - 11.8 FL    NEUTROPHILS 67 40 - 73 %    LYMPHOCYTES 28 21 - 52 %    MONOCYTES 3 3 - 10 %    EOSINOPHILS 2 0 - 5 %    BASOPHILS 0 0 - 2 %    ABS. NEUTROPHILS 7.2 1.8 - 8.0 K/UL    ABS. LYMPHOCYTES 3.0 0.9 - 3.6 K/UL    ABS. MONOCYTES 0.3 0.05 - 1.2 K/UL    ABS. EOSINOPHILS 0.2 0.0 - 0.4 K/UL    ABS. BASOPHILS 0.0 0.0 - 0.06 K/UL    DF AUTOMATED     METABOLIC PANEL, COMPREHENSIVE    Collection Time: 06/15/15 11:30 AM   Result Value Ref Range    Sodium 143 136 - 145 mmol/L    Potassium 3.4 (L) 3.5 - 5.5 mmol/L    Chloride 112 (H) 100 - 108 mmol/L    CO2 19 (L) 21 - 32 mmol/L    Anion gap 12 3.0 - 18 mmol/L    Glucose 114 (H) 74 - 99 mg/dL    BUN 20 (H) 7.0 - 18 MG/DL    Creatinine 1.24 0.6 - 1.3 MG/DL    BUN/Creatinine ratio 16 12 - 20      GFR est AA 55 (L) >60 ml/min/1.62m    GFR est non-AA 46 (L) >60 ml/min/1.716m   Calcium 8.0 (L) 8.5 - 10.1 MG/DL    Bilirubin, total 0.2 0.2 - 1.0 MG/DL    ALT 18 13 -  56 U/L    AST 13 (L) 15 - 37 U/L    Alk. phosphatase 100 45 - 117 U/L    Protein, total 7.7 6.4 - 8.2 g/dL    Albumin 3.3 (L) 3.4 - 5.0 g/dL    Globulin 4.4 (H) 2.0 - 4.0 g/dL    A-G Ratio 0.8 0.8 - 1.7     PROTHROMBIN TIME + INR    Collection Time: 06/15/15 11:30 AM   Result Value Ref Range    Prothrombin time 47.5 (H) 11.5 - 15.2 sec    INR 5.6 (HH) 0.8 - 1.2     PTT    Collection Time: 06/15/15 11:30 AM   Result Value Ref Range    aPTT 80.1 (H) 23.0 - 36.4 SEC   MAGNESIUM    Collection Time: 06/15/15 11:30 AM   Result Value Ref Range    Magnesium 1.4 (L) 1.8 - 2.4 mg/dL   LIPASE    Collection Time: 06/15/15 11:30 AM   Result Value Ref Range    Lipase 69 (L) 73 - 393 U/L   URINE MICROSCOPIC ONLY     Collection Time: 06/15/15 11:30 AM   Result Value Ref Range    WBC 11 to 20 0 - 4 /hpf    RBC >100 (H) 0 - 5 /hpf    Epithelial cells FEW 0 - 5 /lpf    Bacteria 2+ (A) NEG /hpf   HGB & HCT    Collection Time: 06/15/15  3:56 PM   Result Value Ref Range    HGB 10.5 (L) 12.0 - 16.0 g/dL    HCT 33.0 (L) 35.0 - 45.0 %   EKG, 12 LEAD, SUBSEQUENT    Collection Time: 06/15/15  4:37 PM   Result Value Ref Range    Ventricular Rate 71 BPM    Atrial Rate 71 BPM    P-R Interval 122 ms    QRS Duration 100 ms    Q-T Interval 416 ms    QTC Calculation (Bezet) 452 ms    Calculated P Axis 54 degrees    Calculated R Axis 22 degrees    Calculated T Axis 6 degrees    Diagnosis       Normal sinus rhythm  Voltage criteria for left ventricular hypertrophy  Abnormal ECG  No previous ECGs available  Confirmed by Ailene Rud ([id number]) on 06/16/2015 8:23:02 AM     PROTHROMBIN TIME + INR    Collection Time: 06/15/15  5:55 PM   Result Value Ref Range    Prothrombin time 36.5 (H) 11.5 - 15.2 sec    INR 4.0 (H) 0.8 - 1.2     PTT    Collection Time: 06/15/15  5:55 PM   Result Value Ref Range    aPTT 48.2 (H) 23.0 - 36.4 SEC   FIBRINOGEN    Collection Time: 06/15/15  5:55 PM   Result Value Ref Range    Fibrinogen 542 (H) 210 - 451 mg/dL   D DIMER    Collection Time: 06/15/15  5:55 PM   Result Value Ref Range    D DIMER 0.58 (H) <0.46 ug/ml(FEU)   FDP, PLASMA    Collection Time: 06/15/15  5:55 PM   Result Value Ref Range    FIBRIN DEGRAD. PROD. <5 <5 UG/ML   CK    Collection Time: 06/15/15  5:55 PM   Result Value Ref Range    CK 101 26 - 192 U/L   TSH 3RD GENERATION  Collection Time: 06/15/15  5:55 PM   Result Value Ref Range    TSH 1.62 0.36 - 3.74 uIU/mL   HGB & HCT    Collection Time: 06/15/15  5:55 PM   Result Value Ref Range    HGB 10.9 (L) 12.0 - 16.0 g/dL    HCT 33.6 (L) 35.0 - 45.0 %   CBC WITH AUTOMATED DIFF    Collection Time: 06/15/15 11:33 PM   Result Value Ref Range    WBC 11.3 4.6 - 13.2 K/uL    RBC 3.86 (L) 4.20 - 5.30 M/uL     HGB 9.6 (L) 12.0 - 16.0 g/dL    HCT 30.2 (L) 35.0 - 45.0 %    MCV 78.2 74.0 - 97.0 FL    MCH 24.9 24.0 - 34.0 PG    MCHC 31.8 31.0 - 37.0 g/dL    RDW 17.1 (H) 11.6 - 14.5 %    PLATELET 341 135 - 420 K/uL    MPV 9.1 (L) 9.2 - 11.8 FL    NEUTROPHILS 66 40 - 73 %    LYMPHOCYTES 27 21 - 52 %    MONOCYTES 5 3 - 10 %    EOSINOPHILS 2 0 - 5 %    BASOPHILS 0 0 - 2 %    ABS. NEUTROPHILS 7.4 1.8 - 8.0 K/UL    ABS. LYMPHOCYTES 3.1 0.9 - 3.6 K/UL    ABS. MONOCYTES 0.6 0.05 - 1.2 K/UL    ABS. EOSINOPHILS 0.2 0.0 - 0.4 K/UL    ABS. BASOPHILS 0.0 0.0 - 0.06 K/UL    DF AUTOMATED     METABOLIC PANEL, BASIC    Collection Time: 06/15/15 11:33 PM   Result Value Ref Range    Sodium 143 136 - 145 mmol/L    Potassium 4.0 3.5 - 5.5 mmol/L    Chloride 110 (H) 100 - 108 mmol/L    CO2 26 21 - 32 mmol/L    Anion gap 7 3.0 - 18 mmol/L    Glucose 101 (H) 74 - 99 mg/dL    BUN 20 (H) 7.0 - 18 MG/DL    Creatinine 1.77 (H) 0.6 - 1.3 MG/DL    BUN/Creatinine ratio 11 (L) 12 - 20      GFR est AA 37 (L) >60 ml/min/1.73m    GFR est non-AA 30 (L) >60 ml/min/1.732m   Calcium 8.5 8.5 - 10.1 MG/DL   MAGNESIUM    Collection Time: 06/15/15 11:33 PM   Result Value Ref Range    Magnesium 1.9 1.8 - 2.4 mg/dL   PROTHROMBIN TIME + INR    Collection Time: 06/15/15 11:33 PM   Result Value Ref Range    Prothrombin time 27.9 (H) 11.5 - 15.2 sec    INR 2.8 (H) 0.8 - 1.2     PTT    Collection Time: 06/15/15 11:33 PM   Result Value Ref Range    aPTT 49.0 (H) 23.0 - 36.4 SEC   CBC WITH AUTOMATED DIFF    Collection Time: 06/16/15  6:25 AM   Result Value Ref Range    WBC 11.2 4.6 - 13.2 K/uL    RBC 3.94 (L) 4.20 - 5.30 M/uL    HGB 9.9 (L) 12.0 - 16.0 g/dL    HCT 30.9 (L) 35.0 - 45.0 %    MCV 78.4 74.0 - 97.0 FL    MCH 25.1 24.0 - 34.0 PG    MCHC 32.0 31.0 - 37.0 g/dL  RDW 17.0 (H) 11.6 - 14.5 %    PLATELET 362 135 - 420 K/uL    MPV 9.5 9.2 - 11.8 FL    NEUTROPHILS 65 40 - 73 %    LYMPHOCYTES 29 21 - 52 %    MONOCYTES 4 3 - 10 %    EOSINOPHILS 2 0 - 5 %     BASOPHILS 0 0 - 2 %    ABS. NEUTROPHILS 7.2 1.8 - 8.0 K/UL    ABS. LYMPHOCYTES 3.3 0.9 - 3.6 K/UL    ABS. MONOCYTES 0.4 0.05 - 1.2 K/UL    ABS. EOSINOPHILS 0.3 0.0 - 0.4 K/UL    ABS. BASOPHILS 0.0 0.0 - 0.06 K/UL    DF AUTOMATED     METABOLIC PANEL, BASIC    Collection Time: 06/16/15  6:25 AM   Result Value Ref Range    Sodium 144 136 - 145 mmol/L    Potassium 4.1 3.5 - 5.5 mmol/L    Chloride 110 (H) 100 - 108 mmol/L    CO2 24 21 - 32 mmol/L    Anion gap 10 3.0 - 18 mmol/L    Glucose 117 (H) 74 - 99 mg/dL    BUN 22 (H) 7.0 - 18 MG/DL    Creatinine 1.61 (H) 0.6 - 1.3 MG/DL    BUN/Creatinine ratio 14 12 - 20      GFR est AA 41 (L) >60 ml/min/1.22m    GFR est non-AA 34 (L) >60 ml/min/1.755m   Calcium 8.3 (L) 8.5 - 10.1 MG/DL   PROTHROMBIN TIME + INR    Collection Time: 06/16/15  6:25 AM   Result Value Ref Range    Prothrombin time 34.0 (H) 11.5 - 15.2 sec    INR 3.6 (H) 0.8 - 1.2     FFP, ALLOCATE    Collection Time: 06/16/15  8:30 AM   Result Value Ref Range    CALLED TO: F FUTRELL, 3000, ON 06/16/2015 AT 0910 BY TLG     Unit number [id number]   Blood component type FP24H,THAW     Unit division 00     Status of unit ISSUED     Unit number [id number]   Blood component type FP24H,THAW     Unit division 00     Status of unit ISSUED        Lab Results   Component Value Date/Time    GLUCOSE 117 06/16/2015 06:25 AM    GLUCOSE 101 06/15/2015 11:33 PM    GLUCOSE 114 06/15/2015 11:30 AM    GLUCOSE 89 05/05/2014 04:56 PM        [facility]  Hospitalist Division  Pager:  [phone]:  [phone]
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
## 2015-06-16 NOTE — Progress Notes (Addendum)
Dilaudid 1mg /52ml IV pulled for pt this morning at 0820. Per order 0.5 mg IV administered at 0828. Due to critical override in pyxis, pyxis would not allow for waste. 0.5mg  wasted with Haynes Bast, RN.

## 2015-06-16 NOTE — Other (Deleted)
The [facility] [employer] has issued a statement indicating that, ???Individuals who are overweight, obese or morbidly obese are at an increased risk for certain medical conditions when compared to persons of normal weight. Therefore, these conditions are always clinically significant and reportable when documented by the provider.???      Review of the documentation for this patient demonstrates the clinical indicators of a BMI of  43.93 and a weight of   272  lbs.      Please clarify if the patient has a diagnosis associated with those findings:  ____obesity  ____morbid obesity  ____overweight  ____Other weight status (specify status)    Please clarify and document your clinical opinion in the progress notes and discharge summary including the definitive and/or presumptive diagnosis, (suspected or probable), related to the above clinical findings. Please include clinical findings supporting your diagnosis.    If you DECLINE this query or would like to communicate with CDMP, please utilize the "CDMP message box" at the TOP of the Progress Note on the right.      Thank you,    Maye Hides RN [phone]

## 2015-06-17 LAB — HEMOGLOBIN A1C WITH EAG
Est. average glucose: 134 mg/dL
Hemoglobin A1c: 6.3 % — ABNORMAL HIGH (ref 4.2–5.6)

## 2015-06-17 LAB — PLASMA, ALLOCATE
Status of unit: TRANSFUSED
Status of unit: TRANSFUSED
Status of unit: TRANSFUSED
Status of unit: TRANSFUSED
Unit division: 0
Unit division: 0
Unit division: 0
Unit division: 0

## 2015-06-17 LAB — PROTHROMBIN TIME + INR
INR: 1.9 — ABNORMAL HIGH (ref 0.8–1.2)
INR: 1.8 — ABNORMAL HIGH (ref 0.8–1.2)
Prothrombin time: 20.7 s — ABNORMAL HIGH (ref 11.5–15.2)
Prothrombin time: 20 s — ABNORMAL HIGH (ref 11.5–15.2)

## 2015-06-17 LAB — CBC WITH AUTOMATED DIFF
ABS. BASOPHILS: 0 10*3/uL (ref 0.0–0.06)
ABS. EOSINOPHILS: 0 10*3/uL (ref 0.0–0.4)
ABS. LYMPHOCYTES: 1.7 10*3/uL (ref 0.9–3.6)
ABS. MONOCYTES: 0.2 10*3/uL (ref 0.05–1.2)
ABS. NEUTROPHILS: 8.4 10*3/uL — ABNORMAL HIGH (ref 1.8–8.0)
BASOPHILS: 0 % (ref 0–2)
EOSINOPHILS: 0 % (ref 0–5)
HCT: 26.8 % — ABNORMAL LOW (ref 35.0–45.0)
HGB: 8.4 g/dL — ABNORMAL LOW (ref 12.0–16.0)
LYMPHOCYTES: 16 % — ABNORMAL LOW (ref 21–52)
MCH: 24.4 PG (ref 24.0–34.0)
MCHC: 31.3 g/dL (ref 31.0–37.0)
MCV: 77.9 FL (ref 74.0–97.0)
MONOCYTES: 2 % — ABNORMAL LOW (ref 3–10)
MPV: 9.3 FL (ref 9.2–11.8)
NEUTROPHILS: 82 % — ABNORMAL HIGH (ref 40–73)
PLATELET: 355 10*3/uL (ref 135–420)
RBC: 3.44 M/uL — ABNORMAL LOW (ref 4.20–5.30)
RDW: 17.1 % — ABNORMAL HIGH (ref 11.6–14.5)
WBC: 10.3 10*3/uL (ref 4.6–13.2)

## 2015-06-17 LAB — CULTURE, URINE
Culture result:: NO GROWTH
Culture: NO GROWTH

## 2015-06-17 LAB — PTT
aPTT: 36.5 s — ABNORMAL HIGH (ref 23.0–36.4)
aPTT: 36 s (ref 23.0–36.4)
aPTT: 34.3 s (ref 23.0–36.4)

## 2015-06-17 LAB — METABOLIC PANEL, BASIC
Anion gap: 9 mmol/L (ref 3.0–18)
BUN/Creatinine ratio: 14 (ref 12–20)
BUN: 17 MG/DL (ref 7.0–18)
CO2: 25 mmol/L (ref 21–32)
Calcium: 8.7 MG/DL (ref 8.5–10.1)
Chloride: 108 mmol/L (ref 100–108)
Creatinine: 1.24 MG/DL (ref 0.6–1.3)
GFR est AA: 55 mL/min/{1.73_m2} — ABNORMAL LOW (ref >60)
GFR est non-AA: 46 mL/min/{1.73_m2} — ABNORMAL LOW (ref >60)
Glucose: 185 mg/dL — ABNORMAL HIGH (ref 74–99)
Potassium: 3.9 mmol/L (ref 3.5–5.5)
Sodium: 142 mmol/L (ref 136–145)

## 2015-06-17 LAB — GLUCOSE, POC
Glucose (POC): 170 mg/dL — ABNORMAL HIGH (ref 70–110)
Glucose (POC): 109 mg/dL (ref 70–110)
Glucose (POC): 113 mg/dL — ABNORMAL HIGH (ref 70–110)

## 2015-06-17 LAB — MAGNESIUM: Magnesium: 1.4 mg/dL — ABNORMAL LOW (ref 1.8–2.4)

## 2015-06-17 MED ORDER — PANTOPRAZOLE 40 MG IV SOLR
40 mg | INTRAVENOUS | Status: AC
Start: 2015-06-17 — End: 2015-06-17
  Administered 2015-06-17: 03:00:00

## 2015-06-17 MED ORDER — DIPHENHYDRAMINE HCL 50 MG/ML IJ SOLN
50 mg/mL | INTRAMUSCULAR | Status: AC
Start: 2015-06-17 — End: 2015-06-17
  Administered 2015-06-17: 05:00:00

## 2015-06-17 MED ORDER — ZOLPIDEM 5 MG TAB
5 mg | ORAL | Status: AC
Start: 2015-06-17 — End: 2015-06-17
  Administered 2015-06-17: 03:00:00 via ORAL

## 2015-06-17 MED ORDER — CEPHALEXIN 250 MG CAP
250 mg | ORAL | Status: AC
Start: 2015-06-17 — End: 2015-06-17
  Administered 2015-06-17: 03:00:00 via ORAL

## 2015-06-17 MED ORDER — PHYTONADIONE 10 MG/ML INJECTION
10 mg/mL | Freq: Once | INTRAMUSCULAR | Status: AC
Start: 2015-06-17 — End: 2015-06-17
  Administered 2015-06-17: 13:00:00 via SUBCUTANEOUS

## 2015-06-17 MED ORDER — HYDROMORPHONE 2 MG/ML INJECTION SOLUTION
2 mg/mL | INTRAMUSCULAR | Status: AC
Start: 2015-06-17 — End: 2015-06-17
  Administered 2015-06-17: 06:00:00

## 2015-06-17 MED ORDER — INSULIN LISPRO 100 UNIT/ML INJECTION
100 unit/mL | SUBCUTANEOUS | Status: AC
Start: 2015-06-17 — End: 2015-06-17
  Administered 2015-06-17: 03:00:00

## 2015-06-17 MED ORDER — PEG 3350-ELECTROLYTES ORAL SOLUTION
Freq: Once | ORAL | Status: AC
Start: 2015-06-17 — End: 2015-06-18
  Administered 2015-06-18: 06:00:00 via ORAL

## 2015-06-17 MED ORDER — LEVOFLOXACIN IN D5W 750 MG/150 ML IV PIGGY BACK
750 mg/150 mL | INTRAVENOUS | Status: DC
Start: 2015-06-17 — End: 2015-06-19
  Administered 2015-06-17 – 2015-06-18 (×2): via INTRAVENOUS

## 2015-06-17 MED ORDER — ZOLPIDEM 5 MG TAB
5 mg | Freq: Every evening | ORAL | Status: DC | PRN
Start: 2015-06-17 — End: 2015-06-19
  Administered 2015-06-17 – 2015-06-19 (×2): via ORAL

## 2015-06-17 MED ORDER — PEG 3350-ELECTROLYTES ORAL SOLUTION
Freq: Once | ORAL | Status: AC
Start: 2015-06-17 — End: 2015-06-17
  Administered 2015-06-17: 23:00:00 via ORAL

## 2015-06-17 MED ORDER — HYDROMORPHONE (PF) 1 MG/ML IJ SOLN
1 mg/mL | INTRAMUSCULAR | Status: AC
Start: 2015-06-17 — End: 2015-06-17
  Administered 2015-06-17: 02:00:00

## 2015-06-17 MED FILL — BROVANA 15 MCG/2 ML SOLUTION FOR NEBULIZATION: 15 mcg/2 mL | RESPIRATORY_TRACT | Qty: 2

## 2015-06-17 MED FILL — FERROUS SULFATE 325 MG (65 MG ELEMENTAL IRON) TAB: 325 mg (65 mg iron) | ORAL | Qty: 1

## 2015-06-17 MED FILL — CLONIDINE 0.1 MG TAB: 0.1 mg | ORAL | Qty: 2

## 2015-06-17 MED FILL — AMLODIPINE 5 MG TAB: 5 mg | ORAL | Qty: 2

## 2015-06-17 MED FILL — THERA 400 MCG TABLET: 400 mcg | ORAL | Qty: 1

## 2015-06-17 MED FILL — GABAPENTIN 300 MG CAP: 300 mg | ORAL | Qty: 1

## 2015-06-17 MED FILL — PEG 3350-ELECTROLYTES 236 G-22.74 G-6.74 G-5.86 G ORAL SOLUTION: ORAL | Qty: 4000

## 2015-06-17 MED FILL — HYDROMORPHONE (PF) 1 MG/ML IJ SOLN: 1 mg/mL | INTRAMUSCULAR | Qty: 1

## 2015-06-17 MED FILL — ONDANSETRON (PF) 4 MG/2 ML INJECTION: 4 mg/2 mL | INTRAMUSCULAR | Qty: 2

## 2015-06-17 MED FILL — PROTONIX 40 MG INTRAVENOUS SOLUTION: 40 mg | INTRAVENOUS | Qty: 40

## 2015-06-17 MED FILL — CEPHALEXIN 250 MG CAP: 250 mg | ORAL | Qty: 2

## 2015-06-17 MED FILL — LEVOFLOXACIN IN D5W 750 MG/150 ML IV PIGGY BACK: 750 mg/150 mL | INTRAVENOUS | Qty: 150

## 2015-06-17 MED FILL — LISINOPRIL 20 MG TAB: 20 mg | ORAL | Qty: 1

## 2015-06-17 MED FILL — HYDROXYCHLOROQUINE 200 MG TAB: 200 mg | ORAL | Qty: 1

## 2015-06-17 MED FILL — METHYLPREDNISOLONE 4 MG TAB: 4 mg | ORAL | Qty: 3

## 2015-06-17 MED FILL — BUDESONIDE 0.5 MG/2 ML NEB SUSPENSION: 0.5 mg/2 mL | RESPIRATORY_TRACT | Qty: 1

## 2015-06-17 MED FILL — TYLENOL 325 MG TABLET: 325 mg | ORAL | Qty: 2

## 2015-06-17 MED FILL — INSULIN LISPRO 100 UNIT/ML INJECTION: 100 unit/mL | SUBCUTANEOUS | Qty: 1

## 2015-06-17 MED FILL — ZOLPIDEM 5 MG TAB: 5 mg | ORAL | Qty: 2

## 2015-06-17 MED FILL — OXYCODONE-ACETAMINOPHEN 5 MG-325 MG TAB: 5-325 mg | ORAL | Qty: 1

## 2015-06-17 MED FILL — HYDROMORPHONE 2 MG/ML INJECTION SOLUTION: 2 mg/mL | INTRAMUSCULAR | Qty: 1

## 2015-06-17 MED FILL — DIPHENHYDRAMINE HCL 50 MG/ML IJ SOLN: 50 mg/mL | INTRAMUSCULAR | Qty: 1

## 2015-06-17 MED FILL — DEXTROSE 50% IN WATER (D50W) IV SYRG: INTRAVENOUS | Qty: 50

## 2015-06-17 MED FILL — VITAMIN K1 10 MG/ML INJECTION SOLUTION: 10 mg/mL | INTRAMUSCULAR | Qty: 1

## 2015-06-17 MED FILL — MONTELUKAST 10 MG TAB: 10 mg | ORAL | Qty: 1

## 2015-06-17 NOTE — Progress Notes (Signed)
Patient transferred to medical floor.   Pulmonary will sign off. Call us with any questions.     Karis JubaAnil Sibel Khurana, MD 06/17/2015
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
## 2015-06-17 NOTE — Progress Notes (Signed)
OTO-HNS Update:    Pt notes no epistaxis or PND overnight. Does not like nasal saline, but was encourage to continue to do this.    Pt also notes history of ear boils and an episode nasal cautery in the past not previously noted on prior questioning.  Otoscopy demonstrates very small amount of dried blood in bilateral EAC, but no obvious lesions.  TMs are clear.     If pt has future issues with ears, she could seek follow up w/ our general [facility] ENT Dr. Florian Buff.  If needed, call [phone] to make an appointment.      [name], MD, PGY-3  [facility] Otolaryngology

## 2015-06-17 NOTE — Progress Notes (Signed)
[facility]  Hospitalist Division    Daily progress Note    Patient: Torin Whisner MRN: [id number]  CSN: [id number]    Date of Birth: 1966/10/01  Age: 49 y.o.  Sex: female    DOA: 06/15/2015 LOS:  LOS: 2 days                    Interval Events:     Hgb continues gradual drift down  INR 1.8 this am. Additional Vitamin K ordered  Possible EGD and Colonoscopy tomorrow am    Assessment/Plan:     Chief Complaint: Blood in urine and stool. Thigh hematoma     Patient Active Problem List   Diagnosis Code   ??? GI bleed K92.2   ??? 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 History of pulmonary embolism Z86.711   ??? Lupus (HCC) M32.9   ??? History of DVT (deep vein thrombosis) Z86.718   ??? Coagulopathy (HCC) D68.9   ??? Hematuria R31.9   ??? UTI (urinary tract infection) N39.0       A/P:  - Coagulopathy with INR of 5.6 and ptt of 80 - Primary problem seems to be coagulopathy with unknown reason at this point. Heme/ Onc following. Received FFP yesterday. Continues to receive Vitamin K. Awaiting numerous factor studies  - GI Bleed - GI following. Continue Protonix. Plan for possible EGD and colonoscopy tomorrow  - Hematuria with possible UTI - Follow urine culture  - Epistaxis - ENT following. Continue Keflex with packing in place.   - Thigh Hematoma  - Acute blood loss anemia with possible chronic anemia - Follow H&H. No need for transfusion presently.   - AKI - Improved. Continue IV fluids and monitor labs.  - Asthma - continue Albuterol PRN/ Symbicort  - HTN - Continue Clonidine/ Norvasc/ Lisinopril  - Lupus - Continue Plaquenil/ Steroids  - Hx of GI Bleed - Appears to be the reason she was taken off of anticoagulation  - Hx of multiple PE's, DVT's, and an IVC Filter  - Right breast mass - Will need outpatient mammogram and follow-up  - Father with hx of hemophilia and she has hx of DIC in the past  - OK for transfer to Telemetry  ????  ??  Activity: OOB and Mobilize as able  DVT Prophylaxis: SCD's   GI Prophylaxis: Protonix  Care plan discussed    Subjective:      She feels good. She has some lower abdominal pain. She denies any chest pain or shortness of breath. Epistaxis has stopped.     Objective:      Visit Vitals   ??? BP 151/87   ??? Pulse 90   ??? Temp 98.1 ??F (36.7 ??C)   ??? Resp 17   ??? Ht 5\' 6"  (1.676 m)   ??? Wt 121.8 kg (268 lb 8.3 oz)   ??? SpO2 100%   ??? BMI 43.34 kg/m2       Physical Exam:  Gen: In general, this is an obese female, in no acute distress on RA.  HEENT: Sclerae nonicteric. Oral mucous membranes moist. Holding pressure to nasal bridge  Neck: Supple with midline trachea. ??  CV: RRR without murmur or rub appreciated. ??  Resp:Respirations are unlabored without use of accessory muscles. Lung fields bilaterally without wheezes or rhonchi.  Abd: Soft, nontender, nondistended.  Extrem: Extremities are warm, without cyanosis or clubbing. No pitting pretibial edema. Palpable distal pulses X 4. ??  Skin: Warm, no  visible rashes. + hematoma to right lateral thigh with small area of central oozing. Tender to palpation. ??  Neuro: Patient is alert, oriented, and cooperative. No obvious focal defects. Moves all 4 extremities    Intake and Output:  Current Shift:  08/17 0701 - 08/17 1900  In: 100 [I.V.:100]  Out: 300 [Urine:300]  Last three shifts:  08/15 1901 - 08/17 0700  In: 3250 [P.O.:720; I.V.:1300]  Out: 2700 [Urine:2700]    Recent Results (from the past 24 hour(s))   PROTHROMBIN TIME + INR    Collection Time: 06/16/15  1:00 PM   Result Value Ref Range    Prothrombin time 26.8 (H) 11.5 - 15.2 sec    INR 2.6 (H) 0.8 - 1.2     PTT    Collection Time: 06/16/15  1:00 PM   Result Value Ref Range    aPTT 40.5 (H) 23.0 - 36.4 SEC   FFP, ALLOCATE    Collection Time: 06/16/15  1:45 PM   Result Value Ref Range    Unit number [id number]     Blood component type FP24H,THAW     Unit division 00     Status of unit ISSUED     Unit number [id number]     Blood component type FP24H,THAW     Unit division 00      Status of unit ISSUED    GLUCOSE, POC    Collection Time: 06/16/15  5:21 PM   Result Value Ref Range    Glucose (POC) 178 (H) 70 - 110 mg/dL   PTT    Collection Time: 06/16/15  8:15 PM   Result Value Ref Range    aPTT 36.5 (H) 23.0 - 36.4 SEC   PROTHROMBIN TIME + INR    Collection Time: 06/16/15  9:10 PM   Result Value Ref Range    Prothrombin time 20.7 (H) 11.5 - 15.2 sec    INR 1.9 (H) 0.8 - 1.2     PTT    Collection Time: 06/16/15  9:10 PM   Result Value Ref Range    aPTT 36.0 23.0 - 36.4 SEC   GLUCOSE, POC    Collection Time: 06/16/15 10:08 PM   Result Value Ref Range    Glucose (POC) 170 (H) 70 - 110 mg/dL   CBC WITH AUTOMATED DIFF    Collection Time: 06/17/15  4:00 AM   Result Value Ref Range    WBC 10.3 4.6 - 13.2 K/uL    RBC 3.44 (L) 4.20 - 5.30 M/uL    HGB 8.4 (L) 12.0 - 16.0 g/dL    HCT 87.5 (L) 64.3 - 45.0 %    MCV 77.9 74.0 - 97.0 FL    MCH 24.4 24.0 - 34.0 PG    MCHC 31.3 31.0 - 37.0 g/dL    RDW 32.9 (H) 51.8 - 14.5 %    PLATELET 355 135 - 420 K/uL    MPV 9.3 9.2 - 11.8 FL    NEUTROPHILS 82 (H) 40 - 73 %    LYMPHOCYTES 16 (L) 21 - 52 %    MONOCYTES 2 (L) 3 - 10 %    EOSINOPHILS 0 0 - 5 %    BASOPHILS 0 0 - 2 %    ABS. NEUTROPHILS 8.4 (H) 1.8 - 8.0 K/UL    ABS. LYMPHOCYTES 1.7 0.9 - 3.6 K/UL    ABS. MONOCYTES 0.2 0.05 - 1.2 K/UL    ABS. EOSINOPHILS 0.0 0.0 - 0.4 K/UL  ABS. BASOPHILS 0.0 0.0 - 0.06 K/UL    DF AUTOMATED     METABOLIC PANEL, BASIC    Collection Time: 06/17/15  4:00 AM   Result Value Ref Range    Sodium 142 136 - 145 mmol/L    Potassium 3.9 3.5 - 5.5 mmol/L    Chloride 108 100 - 108 mmol/L    CO2 25 21 - 32 mmol/L    Anion gap 9 3.0 - 18 mmol/L    Glucose 185 (H) 74 - 99 mg/dL    BUN 17 7.0 - 18 MG/DL    Creatinine 1.61 0.6 - 1.3 MG/DL    BUN/Creatinine ratio 14 12 - 20      GFR est AA 55 (L) >60 ml/min/1.39m2    GFR est non-AA 46 (L) >60 ml/min/1.23m2    Calcium 8.7 8.5 - 10.1 MG/DL   PROTHROMBIN TIME + INR    Collection Time: 06/17/15  4:00 AM   Result Value Ref Range     Prothrombin time 20.0 (H) 11.5 - 15.2 sec    INR 1.8 (H) 0.8 - 1.2     PTT    Collection Time: 06/17/15  4:00 AM   Result Value Ref Range    aPTT 34.3 23.0 - 36.4 SEC   GLUCOSE, POC    Collection Time: 06/17/15  7:56 AM   Result Value Ref Range    Glucose (POC) 109 70 - 110 mg/dL       Lab Results   Component Value Date/Time    GLUCOSE 185 06/17/2015 04:00 AM    GLUCOSE 117 06/16/2015 06:25 AM    GLUCOSE 101 06/15/2015 11:33 PM    GLUCOSE 114 06/15/2015 11:30 AM    GLUCOSE 89 05/05/2014 04:56 PM        [facility]  Hospitalist Division  Pager:  [phone]  Office:  [phone]
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
## 2015-06-17 NOTE — Progress Notes (Signed)
Gastrointestinal Progress Note    Patient Name: Sydney Luna    ZOXWR'[id number] Date: 06/17/2015    Admit Date: 06/15/2015    Assessment:??   1. Coagulopathy  2. Hematochezia and ?hematemesis  3. Hematuria  4. R thigh hematoma  5. Epistaxis  6. Abd pain  7. H/o DVT/PE and IVC filter  8. SLE  ??  Epistaxis has resolved. No further GI bleeding. INR down to 1.8; more vitamin K pending today. ?bleeding from profound vit K deficiency?   I suspect her GI bleeding was likely to the underlying coagulopathy which is improving. However, now that the INR is down, I do feel that EGD/CSP is worthwhile to ensure no high risk lesions requiring further intervention/treatment. Will plan to keep her on clears today and prep tonight with diagnostic EGD/CSP tomorrow provided the INR remains <2.      Recommendation:??   1. Continue protonix bid  2. Clears today; prep tonight for tentative EGD/CSP tomorrow  3. Recheck INR tomorrow AM    Subjective:     Sydney Luna was seen by ENT with treatment of the epistaxis which has resolved. Feels much improved. 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 Pending additional Vitamin K today. INR down to 1.8 this morning. Has not had a BM Last 24 hours. She is eating breakfast during my interview. Still having some suprapubic abd pain with dysuria similar to at admission. CT yesterday showed no acute findings. No retroperitoneal bleeding. I questioned her again about her GI bleeding prior to admission and she thinks now that she did have some hematemesis in addition to the clots from her rectum although she's not sure if it was epistaxis/coughing or vomiting blood at that time.       Current Facility-Administered Medications   Medication Dose Route Frequency   ??? diphenhydrAMINE (BENADRYL) 50 mg/mL injection       ??? HYDROmorphone (DILAUDID) 2 mg/mL injection       ??? HYDROmorphone (PF) (DILAUDID) injection 0.5 mg  0.5 mg IntraVENous Q4H PRN    ??? 0.9% sodium chloride infusion 250 mL  250 mL IntraVENous PRN   ??? diphenhydrAMINE (BENADRYL) injection 12.5 mg  12.5 mg IntraVENous Q6H PRN   ??? sodium chloride (OCEAN) 0.65 % nasal spray 2 Spray  2 Spray Both Nostrils Q2HWA   ??? cephALEXin (KEFLEX) capsule 500 mg  500 mg Oral TID   ??? bacitracin-polymyxin b (POLYSPORIN) 500-10,000 unit/gram ointment   Topical BID   ??? oxymetazoline (AFRIN) 0.05 % nasal spray 2 Spray  2 Spray Both Nostrils BID   ??? lisinopril (PRINIVIL, ZESTRIL) tablet 20 mg  20 mg Oral DAILY   ??? insulin lispro (HUMALOG) injection   SubCUTAneous AC&HS   ??? glucose chewable tablet 16 g  4 Tab Oral PRN   ??? glucagon (GLUCAGEN) injection 1 mg  1 mg IntraMUSCular PRN   ??? dextrose (D50W) injection syrg 12.5-25 g  25-50 mL IntraVENous PRN   ??? pantoprazole (PROTONIX) 40 mg in sodium chloride 0.9 % 10 mL injection  40 mg IntraVENous Q12H   ??? zolpidem (AMBIEN) tablet 10 mg  10 mg Oral QHS PRN   ??? HYDROmorphone (PF) (DILAUDID) 1 mg/mL injection       ??? zolpidem (AMBIEN) 5 mg tablet       ??? cephALEXin (KEFLEX) 250 mg capsule       ??? pantoprazole (PROTONIX) 40 mg injection       ??? insulin lispro (  HUMALOG) 100 unit/mL injection       ??? 0.9% sodium chloride infusion  100 mL/hr IntraVENous CONTINUOUS   ??? acetaminophen (TYLENOL) tablet 650 mg  650 mg Oral Q4H PRN   ??? ondansetron (ZOFRAN) injection 4 mg  4 mg IntraVENous Q4H PRN   ??? albuterol (PROVENTIL VENTOLIN) nebulizer solution 2.5 mg  2.5 mg Nebulization Q4H PRN   ??? amLODIPine (NORVASC) tablet 10 mg  10 mg Oral DAILY   ??? cloNIDine HCl (CATAPRES) tablet 0.2 mg  0.2 mg Oral BID   ??? ferrous sulfate tablet 325 mg  325 mg Oral BID   ??? gabapentin (NEURONTIN) capsule 300 mg  300 mg Oral TID   ??? hydroxychloroquine (PLAQUENIL) tablet 200 mg  200 mg Oral DAILY   ??? methylPREDNISolone (MEDROL) tablet 12 mg  12 mg Oral DAILY WITH BREAKFAST   ??? montelukast (SINGULAIR) tablet 10 mg  10 mg Oral DAILY   ??? oxyCODONE-acetaminophen (PERCOCET) 5-325 mg per tablet 1 Tab  1 Tab Oral  Q4H PRN   ??? therapeutic multivitamin (THERAGRAN) tablet 1 Tab  1 Tab Oral DAILY   ??? budesonide (PULMICORT) 500 mcg/2 ml nebulizer suspension  500 mcg Nebulization BID RT   ??? arformoterol (BROVANA) neb solution 15 mcg  15 mcg Nebulization BID RT          Objective:     Physical Exam:    Visit Vitals   ??? BP 126/85   ??? Pulse 79   ??? Temp 98.1 ??F (36.7 ??C)   ??? Resp 18   ??? Ht 5\' 6"  (1.676 m)   ??? Wt 121.8 kg (268 lb 8.3 oz)   ??? SpO2 100%   ??? BMI 43.34 kg/m2     General appearance: alert, cooperative, no distress, appears stated age; eating breakfast  Nose: no ongoing epistaxis  Lungs: clear to auscultation bilaterally  Heart: regular rate and rhythm, S1, S2 normal, no murmur, click, rub or gallop  Abdomen: soft, diffuse mild lower abd tenderness / suprapubic; without rebound/guarding. Bowel sounds normal. No masses, no organomegaly    Data Review:    Labs: Results:       Chemistry Recent Labs      06/17/15   0400  06/16/15   0625  06/15/15   2333  06/15/15   1130   GLU  185*  117*  101*  114*   NA  142  144  143  143   K  3.9  4.1  4.0  3.4*   CL  108  110*  110*  112*   CO2  25  24  26   19*   BUN  17  22*  20*  20*   CREA  1.24  1.61*  1.77*  1.24   CA  8.7  8.3*  8.5  8.0*   AGAP  9  10  7  12    BUCR  14  14  11*  16   AP   --    --    --   100   TP   --    --    --   7.7   ALB   --    --    --   3.3*   GLOB   --    --    --   4.4*   AGRAT   --    --    --   0.8      CBC w/Diff Recent  Labs      06/17/15   0400  06/16/15   0625  06/15/15   2333   WBC  10.3  11.2  11.3   RBC  3.44*  3.94*  3.86*   HGB  8.4*  9.9*  9.6*   HCT  26.8*  30.9*  30.2*   PLT  355  362  341   GRANS  82*  65  66   LYMPH  16*  29  27   EOS  0  2  2      Coagulation Recent Labs      06/17/15   0400  06/16/15   2110   PTP  20.0*  20.7*   INR  1.8*  1.9*   APTT  34.3  36.0       Liver Enzymes Recent Labs      06/15/15   1130   TP  7.7   ALB  3.3*   AP  100   SGOT  13*   ALT  18          Huntley Dec, MD  June 17, 2015

## 2015-06-17 NOTE — Progress Notes (Addendum)
0800 assessment completed.  Pt is alert,oriented.  No nose bleeding noted.  SR on the monitor.  Lungs sound clear to auscultation  Abdomen soft,non tender.  Uses the bedpan.  1145 TRANSFER - OUT REPORT:    Verbal report given to Ameka RN(name) on Sydney Luna  [facility]  [facility]) for routine progression of care       Report consisted of patient???s Situation, Background, Assessment and   Recommendations(SBAR).     Information from the following report(s) SBAR, Kardex, Intake/Output, MAR, Recent Results and Cardiac Rhythm sr was reviewed with the receiving nurse.    Lines:   Venous Access Device mediport Upper chest (subclavicular area, right (Active)   Central Line Being Utilized Yes 06/17/2015  8:00 AM   Criteria for Appropriate Use Limited/no vessel suitable for conventional peripheral access 06/17/2015  8:00 AM   Site Assessment Clean, dry, & intact 06/17/2015  8:00 AM   Date of Last Dressing Change 06/17/15 06/17/2015  8:00 AM   Dressing Status Clean, dry, & intact 06/17/2015  8:00 AM   Dressing Type Disk with Chlorhexadine Gluconate (CHG) 06/17/2015  8:00 AM   Action Taken Open ports on tubing capped 06/17/2015  8:00 AM   Date Accessed (Medial Site) 06/15/15 06/17/2015  8:00 AM   Positive Blood Return (Medial Site) Yes 06/17/2015  8:00 AM   Alcohol Cap Used Yes 06/16/2015  7:00 PM        Opportunity for questions and clarification was provided.      Patient transported with:   Registered Nurse  Tech

## 2015-06-17 NOTE — Progress Notes (Addendum)
1945-Bedside and Verbal shift change report given to this nurse (oncoming nurse) by Ewell PoeEmeka Ozoh, RN (offgoing nurse). Report included the following information SBAR, Kardex, Intake/Output, MAR and Cardiac Rhythm NSR. Pt in bed, HOB elevated, bed in lowest position, locked, call bell and phone within reach. No S/S of distress noted. Pt states Constant abd pain is aching at a 9/10 with a goal of 4/10, PRN medication adminsistered.     2043-Pt states nose is running, PRN medication administered as ordered, pt tolerated well.     2139-Scheduled medication administered as ordered, pt tolerated well.    2351- Pt states pain is a 9/10 with a goal of 4/10, PRN Medication administered as ordered.    0422- Pt states pain is a 9/10 with a goal of 4/10, PRN Medication administered as ordered.    0543- Pt states nose is running, PRN medication administered as ordered, pt tolerated well.     0725- Bedside and Verbal shift change report given to Zella Ballobin, RN (oncoming nurse) by this nurse (offgoing nurse). Report included the following information SBAR, Kardex, Intake/Output, MAR and Cardiac Rhythm NSR. Pt in bed, HOB elevated, bed in lowest position, locked, call bell and phone within reach. No S/S of distress noted.

## 2015-06-17 NOTE — Progress Notes (Addendum)
1130- Patient arrived the unit from ICU awake alert and oriented x4 no acute distress noted.    1420-Pt. oob to the bathroom and back to bed no change of status.    1730- Pt. Tolerated dinner resting quietly in bed.    1930-Bedside and Verbal shift change report given to Nelva NayAnnamaria Stone RN (oncoming nurse) by Ewell Poeemeka Ozoh Rn (offgoing nurse). Report included the following information SBAR, Kardex, MAR and Recent Results.

## 2015-06-17 NOTE — Progress Notes (Signed)
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 changes of cholecystectomy noted.        PANCREAS: Normal.     SPLEEN: Normal.    ADRENALS: Normal.    KIDNEYS/URETERS/BLADDER: No hydronephrosis or nephrolithiasis present in either  kidney. Increased attenuation of the bladder noted.    PELVIC ORGANS: The uterus is surgically absent.    VASCULATURE: Mild aortobiiliac atherosclerotic aneurysmal dilatation. An  inferior vena cava filter is present below level of the renal veins.    LYMPH NODES: Scattered subcentimeter mesenteric  and retroperitoneal lymph nodes  unchanged. Prominent right external iliac and right obturator lymph nodes  unchanged from prior.    GASTROINTESTINAL TRACT: The stomach, small intestine, and large intestine are  normal in course and caliber. There is no evidence of bowel obstruction or  perforation.        BONES: Post surgical changes of Girdlestone resection arthroplasty noted  involving the right hip joint. No soft tissue gas identified within the  postoperative joint space. As before, stranding along the surgical incision  noted, and incompletely included within the imaged volume. Posterior to the  residual proximal right femur, increased soft tissue/fluid density is present  (series 3, image 109), which when correlating sagittal imaging appears similar  to prior, and is likely reflective of differences in leg position. 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 Vertebral body hemangiomata present in T12 and L2.      OTHER: The retroperitoneal fat is normal in appearance. There is no evidence of  retroperitoneal hematoma present. Included soft tissues of the proximal thighs  demonstrate no new or acute fluid collection.    _______________         Impression IMPRESSION:    1.  No evidence of retroperitoneal hematoma.  2. Mildly increased attenuation of the bladder, homogeneous in appearance, most  likely reflective of excreted iodinated contrast from prior CT examination.  Conceivably, given the patient's history of coagulopathy, blood products could  appear similarly. No hydronephrosis identified. Correlation clinically  recommended.  3. 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  4. Partial inclusion of a right breast mass. Correlation with prior mammographic  evaluation recommended. If no prior imaging is available for comparison, further  evaluation with diagnostic mammography is recommended when the patient is  clinically able.               XR (Most Recent). CXR  reviewed by me and compared with previous CXR   Results from Hospital Encounter encounter on 06/15/15   XR CHEST PORT   Narrative History: Chest pain, shortness of breath    Comparison 05/05/2014    FINDINGS: AP portable upright chest obtained. Right IJ tunneled Mediport  catheter remains in place with its tip in the upper right atrium. There are  lines likely external to the chest across the mid chest. Heart size likely upper  limits of normal. No evidence of new consolidation mass pleural effusion or  pneumothorax. Degenerative multilevel thoracic spondylosis. Persistent abnormal  sclerosis and flattening of the humeral heads suggestive of AVN.         Impression IMPRESSION:    1. No evidence of new active cardiopulmonary disease or significant  interval  change.    2. Suggestion of AVN bilateral humeral heads.                 The patient is:  acutely ill Risk of deterioration:  moderate     critically ill   high     See my orders for details    My assessment, plan of care, findings, medications, side effects etc were discussed with:   Nurse  PT/OT     Respiratory therapy  Dr. Ysidro Evert. Dr. Linwood Dibbles.      Pharmacist  MDR    Family: answered all questions to satisfaction  Patient: answered all questions to satisfaction            Case &  management strategies discussed today on multidisciplinary rounds    High complexity decision making was performed during the evaluation of this patient at high risk for decompensation with multiple organ involvement      Lorie Phenix, MD  06/17/2015

## 2015-06-18 LAB — PTT
aPTT: 29.7 s (ref 23.0–36.4)
aPTT: 29 s (ref 23.0–36.4)

## 2015-06-18 LAB — CBC WITH AUTOMATED DIFF
ABS. BASOPHILS: 0 10*3/uL (ref 0.0–0.06)
ABS. EOSINOPHILS: 0 10*3/uL (ref 0.0–0.4)
ABS. LYMPHOCYTES: 5.2 10*3/uL — ABNORMAL HIGH (ref 0.9–3.6)
ABS. MONOCYTES: 0.6 10*3/uL (ref 0.05–1.2)
ABS. NEUTROPHILS: 9.1 10*3/uL — ABNORMAL HIGH (ref 1.8–8.0)
BASOPHILS: 0 % (ref 0–2)
EOSINOPHILS: 0 % (ref 0–5)
HCT: 27.5 % — ABNORMAL LOW (ref 35.0–45.0)
HGB: 8.7 g/dL — ABNORMAL LOW (ref 12.0–16.0)
LYMPHOCYTES: 35 % (ref 21–52)
MCH: 24.8 PG (ref 24.0–34.0)
MCHC: 31.6 g/dL (ref 31.0–37.0)
MCV: 78.3 FL (ref 74.0–97.0)
MONOCYTES: 4 % (ref 3–10)
MPV: 9.4 FL (ref 9.2–11.8)
NEUTROPHILS: 61 % (ref 40–73)
PLATELET: 347 10*3/uL (ref 135–420)
RBC: 3.51 M/uL — ABNORMAL LOW (ref 4.20–5.30)
RDW: 17.1 % — ABNORMAL HIGH (ref 11.6–14.5)
WBC: 14.9 10*3/uL — ABNORMAL HIGH (ref 4.6–13.2)

## 2015-06-18 LAB — METABOLIC PANEL, BASIC
Anion gap: 10 mmol/L (ref 3.0–18)
BUN/Creatinine ratio: 15 (ref 12–20)
BUN: 17 MG/DL (ref 7.0–18)
CO2: 26 mmol/L (ref 21–32)
Calcium: 9 MG/DL (ref 8.5–10.1)
Chloride: 107 mmol/L (ref 100–108)
Creatinine: 1.14 MG/DL (ref 0.6–1.3)
GFR est AA: >60 mL/min/{1.73_m2} (ref >60)
GFR est non-AA: 50 mL/min/{1.73_m2} — ABNORMAL LOW (ref >60)
Glucose: 97 mg/dL (ref 74–99)
Potassium: 3.4 mmol/L — ABNORMAL LOW (ref 3.5–5.5)
Sodium: 143 mmol/L (ref 136–145)

## 2015-06-18 LAB — PROTHROMBIN TIME + INR
INR: 1.4 — ABNORMAL HIGH (ref 0.8–1.2)
Prothrombin time: 16.6 s — ABNORMAL HIGH (ref 11.5–15.2)

## 2015-06-18 LAB — GLUCOSE, POC
Glucose (POC): 107 mg/dL (ref 70–110)
Glucose (POC): 68 mg/dL — ABNORMAL LOW (ref 70–110)
Glucose (POC): 87 mg/dL (ref 70–110)
Glucose (POC): 102 mg/dL (ref 70–110)
Glucose (POC): 138 mg/dL — ABNORMAL HIGH (ref 70–110)

## 2015-06-18 LAB — HGB & HCT
HCT: 26 % — ABNORMAL LOW (ref 35.0–45.0)
HCT: 22.7 % — ABNORMAL LOW (ref 35.0–45.0)
HGB: 8.2 g/dL — ABNORMAL LOW (ref 12.0–16.0)
HGB: 7.2 g/dL — ABNORMAL LOW (ref 12.0–16.0)

## 2015-06-18 LAB — PLATELET AGGREGATION

## 2015-06-18 MED ORDER — PANTOPRAZOLE 40 MG TAB, DELAYED RELEASE
40 mg | Freq: Two times a day (BID) | ORAL | Status: DC
Start: 2015-06-18 — End: 2015-06-19
  Administered 2015-06-18 – 2015-06-19 (×2): via ORAL

## 2015-06-18 MED ORDER — SIMETHICONE 40 MG/0.6 ML ORAL DROPS, SUSP
40 mg/0.6 mL | ORAL | Status: DC | PRN
Start: 2015-06-18 — End: 2015-06-18

## 2015-06-18 MED ORDER — POTASSIUM CHLORIDE SR 20 MEQ TAB, PARTICLES/CRYSTALS
20 mEq | ORAL | Status: AC
Start: 2015-06-18 — End: 2015-06-18
  Administered 2015-06-18: 16:00:00 via ORAL

## 2015-06-18 MED ORDER — SODIUM CHLORIDE 0.9 % IJ SYRG
INTRAMUSCULAR | Status: AC | PRN
Start: 2015-06-18 — End: 2015-06-18

## 2015-06-18 MED ORDER — PROPOFOL 10 MG/ML IV EMUL
10 mg/mL | INTRAVENOUS | Status: DC | PRN
Start: 2015-06-18 — End: 2015-06-18
  Administered 2015-06-18 (×2): via INTRAVENOUS

## 2015-06-18 MED ORDER — DEXTROSE 50% IN WATER (D50W) IV
INTRAVENOUS | Status: AC
Start: 2015-06-18 — End: 2015-06-18

## 2015-06-18 MED ORDER — SODIUM CHLORIDE 0.9 % IJ SYRG
Freq: Three times a day (TID) | INTRAMUSCULAR | Status: AC
Start: 2015-06-18 — End: 2015-06-18
  Administered 2015-06-18: 21:00:00 via INTRAVENOUS

## 2015-06-18 MED ORDER — LACTATED RINGERS IV
INTRAVENOUS | Status: DC | PRN
Start: 2015-06-18 — End: 2015-06-18
  Administered 2015-06-18: 15:00:00 via INTRAVENOUS

## 2015-06-18 MED FILL — DEXTROSE 50% IN WATER (D50W) IV: INTRAVENOUS | Qty: 50

## 2015-06-18 MED FILL — OXYCODONE-ACETAMINOPHEN 5 MG-325 MG TAB: 5-325 mg | ORAL | Qty: 1

## 2015-06-18 MED FILL — INSULIN LISPRO 100 UNIT/ML INJECTION: 100 unit/mL | SUBCUTANEOUS | Qty: 1

## 2015-06-18 MED FILL — DIPRIVAN 10 MG/ML INTRAVENOUS EMULSION: 10 mg/mL | INTRAVENOUS | Qty: 70

## 2015-06-18 MED FILL — HYDROMORPHONE (PF) 1 MG/ML IJ SOLN: 1 mg/mL | INTRAMUSCULAR | Qty: 1

## 2015-06-18 MED FILL — BD POSIFLUSH NORMAL SALINE 0.9 % INJECTION SYRINGE: INTRAMUSCULAR | Qty: 10

## 2015-06-18 MED FILL — LACTATED RINGERS IV: INTRAVENOUS | Qty: 200

## 2015-06-18 MED FILL — CLONIDINE 0.1 MG TAB: 0.1 mg | ORAL | Qty: 2

## 2015-06-18 MED FILL — BROVANA 15 MCG/2 ML SOLUTION FOR NEBULIZATION: 15 mcg/2 mL | RESPIRATORY_TRACT | Qty: 2

## 2015-06-18 MED FILL — DIPRIVAN 10 MG/ML INTRAVENOUS EMULSION: 10 mg/mL | INTRAVENOUS | Qty: 409.92

## 2015-06-18 MED FILL — GABAPENTIN 300 MG CAP: 300 mg | ORAL | Qty: 1

## 2015-06-18 MED FILL — PANTOPRAZOLE 40 MG TAB, DELAYED RELEASE: 40 mg | ORAL | Qty: 1

## 2015-06-18 MED FILL — SIMETHICONE 40 MG/0.6 ML ORAL DROPS, SUSP: 40 mg/0.6 mL | ORAL | Qty: 1.2

## 2015-06-18 MED FILL — PROTONIX 40 MG INTRAVENOUS SOLUTION: 40 mg | INTRAVENOUS | Qty: 40

## 2015-06-18 MED FILL — CEPHALEXIN 250 MG CAP: 250 mg | ORAL | Qty: 2

## 2015-06-18 MED FILL — BUDESONIDE 0.5 MG/2 ML NEB SUSPENSION: 0.5 mg/2 mL | RESPIRATORY_TRACT | Qty: 1

## 2015-06-18 MED FILL — DIPHENHYDRAMINE HCL 50 MG/ML IJ SOLN: 50 mg/mL | INTRAMUSCULAR | Qty: 1

## 2015-06-18 MED FILL — POTASSIUM CHLORIDE SR 20 MEQ TAB, PARTICLES/CRYSTALS: 20 mEq | ORAL | Qty: 1

## 2015-06-18 MED FILL — FERROUS SULFATE 325 MG (65 MG ELEMENTAL IRON) TAB: 325 mg (65 mg iron) | ORAL | Qty: 1

## 2015-06-18 NOTE — Procedures (Signed)
Procedures  by Juliane Lack, MD at 06/18/15 1123                Author: Juliane Lack, MD  Service: Gastroenterology  Author Type: Physician       Filed: 06/18/15 1127  Date of Service: 06/18/15 1123  Status: Signed          Editor: Juliane Lack, MD (Physician)            Pre-procedure Diagnoses        1. Hematochezia [K92.1]                           Post-procedure Diagnoses        1. Gastritis [K29.70]        2. Hemorrhoids, unspecified hemorrhoid type [K64.9]                           Procedures        1. UPPER GI ENDOSCOPY,BIOPSY [[id number]]        2. COLONOSCOPY [MWN0272 (Custom)]                                      Endoscopy Procedure Note          Patient: Sydney Luna  MRN: [id number]   SSN: [id number]          Date of Birth: 1966/09/13   Age: 49 y.o.   Sex: female         Date/Time:   06/18/2015 11:23 AM      Esophagogastroduodenoscopy (EGD) Procedure Note      Procedure: Esophagogastroduodenoscopy with biopsy      IMPRESSION:    1. Mild gastritis.  Biopsies taken for H. Pylori.    2. Otherwise normal EGD.  No bleeding source noted.       RECOMMENDATIONS:   1. Resume regular diet.    2. Will contact with biopsy results.    3. PPI orally daily.    4. Avoid NSAIDs.    5. Follow H/H.       Indication: Melena/hematochezia   Operator:  Juliane Lack, MD   Assistants: Endoscopy Technician-1: Roney Jaffe   Endoscopy RN-1: Leonides Sake, RN      Referring Provider:   [name], MD   History: The history and physical exam were reviewed and updated.    Endoscope: Olympus GIF-190 diagnostic endoscope   Extent of Exam: second portion of the duodenum   ASA: ASA 3 - Patient with moderate systemic disease with functional limitations   Anethesia/Sedation:  MAC anesthesia      Description of the procedure:    The procedure was discussed with the patient including risks, benefits, alternatives including risks of iv sedation, bleeding, perforation and aspiration.  A safety timeout was  performed. The patient was placed in the left lateral decubitus position.   A bite block was placed.  The patient was given incremental doses of intravenous sedation until moderate sedation was achieved.  The patients vital signs were monitored at all times including heart rate/rhythm, blood pressure and oxygen saturation.   The endoscope was then passed under direct visualization to the second portion of the duodenum.  The endoscope was then slowly withdrawn while visualizing the mucosa.  In the stomach a retroflexion was performed and gastric  fundus and cardia visualized.   The endoscope was then slowly withdrawn.  The patient was then transferred to recovery in stable condition.                  Findings:     Esophagus:The esophageal mucosa was normal with no ulceration,  mass or stricture.  There was no evidence of Barrett's esophagus or reflux esophagitis.    Stomach: The gastric mucosa was mildly edematous and erythematous.  No ulceration, mass, stricture.  Two pinch biopsies taken for H.  Pylori.     Duodenum: The duodenum mucosa was normal with no ulceration, mass, stricture and no evidence of villous atrophy.       Therapies:  None      Specimens:            ID  Type  Source  Tests  Collected by  Time  Destination     1 : Gastric Bx's  Preservative  Gastric    Juliane Lack, MD  06/18/2015 1104  Pathology                   Complications:   None; patient tolerated the procedure well.      RUE:AVWU      Discharge disposition:  Return to regular floor.       Juliane Lack, MD   June 18, 2015   11:23 AM                    Colonoscopy Report          Patient: Sydney Luna  MRN: [id number]   SSN: [id number]          Date of Birth: Jan 17, 1966   Age: 49 y.o.   Sex: female         Date of Procedure: 06/18/2015      IMPRESSION:   1. Internal hemorrhoids.    2. Otherwise normal colonoscopy including terminal ileum.          RECOMMENDATIONS:   1.  Resume regular diet, Recommend high fiber.   2.   Follow for  signs of rebleeding.       Indication:  Hematochezia/melena   Procedure Performed: Colonoscopy    Endoscopist: Juliane Lack, MD   Assistant: Endoscopy Technician-1: Roney Jaffe   Endoscopy RN-1: Leonides Sake, RN   ASA: ASA 3 - Patient with moderate systemic disease with functional limitations   Mallampati Score: II (soft palate, uvula, fauces visible)   Anesthesia: MAC anesthesia   Endoscope:          [x]   CF H 190AL    []   PCF H190AL    []   GIF H 190       Extent of Examination:terminal  ileum   Quality of Preparation:            []   Excellent    []   Very Good    [x]  Fair but adequate    []  Fair, inadequate    []   Poor          Technique: The procedure was discussed with the patient including risks, benefits, alternatives including risks of IV sedation, bleeding, perforation and missed polyp.  A safety timeout was performed.   The patient was given incremental doses of intravenous sedation to achieve moderate sedation.  The patients vital signs were monitored at all times including heart rate, rhythm, blood pressure and oxygen saturation. The patient was placed in left  lateral  position.    When adequate sedation was achieved a perianal inspection and a digital rectal exam were performed. Video colonoscope was introduced into the rectum and advanced under direct vision up to the terminal  ileum. The cecum was identified by IC valve, appendiceal orifice and crows foot. With adequate insufflation and maneuvering of the withdrawing scope, the colonic mucosa was visualized carefully. Retroflexion  was performed in the rectum and the distal rectum visualized.  The patient tolerated the procedure very well and was transferred to recovery area.       Findings:   1.  Normal rectal exam.    2.  Normal terminal ileum with no ulceration, mass, stricture.  No blood or bleeding source noted.    3.  There were small hemorrhoids in the anal canal. No active bleeding.    4.  The colonic mucosa was otherwise normal with  no polyps, masses, ulcerations, strictures or vascular abnormalities.  No blood or bleeding source noted.          ZOX:WRUE   Specimen:            ID  Type  Source  Tests  Collected by  Time  Destination     1 : Gastric Bx's  Preservative  Gastric    Juliane Lack, MD  06/18/2015 1104  Pathology            Sydell Axon, MD   June 18, 2015   11:25 AM

## 2015-06-18 NOTE — Anesthesia Post-Procedure Evaluation (Signed)
Post-Anesthesia Evaluation & Assessment    Visit Vitals   ??? BP 144/66   ??? Pulse 81   ??? Temp 36.7 ??C (98.1 ??F)   ??? Resp 16   ??? Ht 5\' 6"  (1.676 m)   ??? Wt 122 kg (268 lb 14.4 oz)   ??? SpO2 99%   ??? BMI 43.4 kg/m2       Nausea/Vomiting: no nausea    Post-operative hydration adequate.    Pain score (VAS): 0    Mental status & Level of consciousness: alert and oriented x 3    Neurological status: moves all extremities, sensation grossly intact    Pulmonary status: airway patent, no supplemental oxygen required    Complications related to anesthesia: none    Additional comments:

## 2015-06-18 NOTE — Progress Notes (Signed)
Hematology/Oncology Progress Note    Assessment/Plan:????   1) Coagulopathy with prolonged pt and ptt: Responded somewhat to vit k and FFP. Mixing studies corredted c/w factors deficiencies or vit k deficency.  -suspect severe vit k deficiency with recent multiple course of antibiotics. ??  -INR improve after additional dose of vit k.  -follow up factor level that has been ordered.  2) Hematochezia:  -work up per GI.  3) anemia: suspect chronic disease.  -known SLE.  4) R thigh hematoma  5) h/o DVT/PE and IVC filter  6) SLE    review of systems:  -positive for no bleeding, no GI symptoms, fatigue, SOBOE    Physical exam:  Gen: not acute distress on RA.  HEENT: Sclerae nonicteric. ??  CV: RRR ??  Resp:CTA  Abd: Soft, nontender, nondistended.  Extrem: Extremities are warm, no edema. ??  Neuro: alert and orientated x 3

## 2015-06-18 NOTE — Other (Signed)
0700- Bedside report received from Sydney Luna, Charity fundraiserN. Pt AA&Ox4.  0800- Morning medication passed. Hypoglycemia at 68. Asymptomatic. 1/2 amp D50 given. Will re-check

## 2015-06-18 NOTE — Procedures (Signed)
Endoscopy Procedure Note    Patient: Sydney Luna MRN: [id number]  SSN: [id number]    Date of Birth: 22-Aug-1966  Age: 49 y.o.  Sex: female      Date/Time:  06/18/2015 11:23 AM    Esophagogastroduodenoscopy (EGD) Procedure Note    Procedure: Esophagogastroduodenoscopy with biopsy    IMPRESSION:   1. Mild gastritis.  Biopsies taken for H. Pylori.   2. Otherwise normal EGD.  No bleeding source noted.     RECOMMENDATIONS:  1. Resume regular diet.   2. Will contact with biopsy results.   3. PPI orally daily.   4. Avoid NSAIDs.   5. Follow H/H.     Indication: Melena/hematochezia  Operator:  Juliane Lack, MD  Assistants: Endoscopy Technician-1: Roney Jaffe  Endoscopy RN-1: Leonides Sake, RN    Referring Provider:   [name], MD  History: The history and physical exam were reviewed and updated.   Endoscope: Olympus GIF-190 diagnostic endoscope  Extent of Exam: second portion of the duodenum  ASA: ASA 3 - Patient with moderate systemic disease with functional limitations  Anethesia/Sedation:  MAC anesthesia    Description of the procedure:   The procedure was discussed with the patient including risks, benefits, alternatives including risks of iv sedation, bleeding, perforation and aspiration.  A safety timeout was performed. The patient was placed in the left lateral decubitus position.  A bite block was placed.  The patient was given incremental doses of intravenous sedation until moderate sedation was achieved.  The patients vital signs were monitored at all times including heart rate/rhythm, blood pressure and oxygen saturation.  The endoscope was then passed under direct visualization to the second portion of the duodenum.  The endoscope was then slowly withdrawn while visualizing the mucosa.  In the stomach a retroflexion was performed and gastric fundus and cardia visualized.  The endoscope was then slowly withdrawn.  The patient was then transferred to recovery in stable condition.                 Findings:    Esophagus:The esophageal mucosa was normal with no ulceration, mass or stricture.  There was no evidence of Barrett's esophagus or reflux esophagitis.   Stomach: The gastric mucosa was mildly edematous and erythematous.  No ulceration, mass, stricture.  Two pinch biopsies taken for H. Pylori.    Duodenum: The duodenum mucosa was normal with no ulceration, mass, stricture and no evidence of villous atrophy.     Therapies:  None    Specimens:   ID Type Source Tests Collected by Time Destination   1 : Gastric Bx's Preservative Gastric  Juliane Lack, MD 06/18/2015 1104 Pathology               Complications:   None; patient tolerated the procedure well.    NWG:NFAO    Discharge disposition:  Return to regular floor.     Juliane Lack, MD  June 18, 2015  11:23 AM            Colonoscopy Report    Patient: Sydney Luna MRN: [id number]  SSN: [id number]    Date of Birth: May 05, 1966  Age: 49 y.o.  Sex: female      Date of Procedure: 06/18/2015    IMPRESSION:  1. Internal hemorrhoids.   2. Otherwise normal colonoscopy including terminal ileum.       RECOMMENDATIONS:  1. Resume regular diet, Recommend high fiber.  2.   Follow for signs of rebleeding.  Indication:  Hematochezia/melena  Procedure Performed: Colonoscopy   Endoscopist: Juliane Lack, MD  Assistant: Endoscopy Technician-1: Roney Jaffe  Endoscopy RN-1: Leonides Sake, RN  ASA: ASA 3 - Patient with moderate systemic disease with functional limitations  Mallampati Score: II (soft palate, uvula, fauces visible)  Anesthesia: MAC anesthesia  Endoscope:       CF H 190AL     PCF H190AL     GIF H 190    Extent of Examination:terminal ileum  Quality of Preparation:       Excellent     Very Good    Fair but adequate    Fair, inadequate     Poor      Technique: The procedure was discussed with the patient including risks, benefits, alternatives including risks of IV sedation, bleeding,  perforation and missed polyp.  A safety timeout was performed.  The patient was given incremental doses of intravenous sedation to achieve moderate sedation.  The patients vital signs were monitored at all times including heart rate, rhythm, blood pressure and oxygen saturation. The patient was placed in left lateral position.   When adequate sedation was achieved a perianal inspection and a digital rectal exam were performed. Video colonoscope was introduced into the rectum and advanced under direct vision up to the terminal ileum. The cecum was identified by IC valve, appendiceal orifice and crows foot. With adequate insufflation and maneuvering of the withdrawing scope, the colonic mucosa was visualized carefully. Retroflexion was performed in the rectum and the distal rectum visualized.  The patient tolerated the procedure very well and was transferred to recovery area.     Findings:  1. Normal rectal exam.   2. Normal terminal ileum with no ulceration, mass, stricture.  No blood or bleeding source noted.   3. There were small hemorrhoids in the anal canal. No active bleeding.   4. The colonic mucosa was otherwise normal with no polyps, masses, ulcerations, strictures or vascular abnormalities.  No blood or bleeding source noted.       ZOX:WRUE  Specimen:   ID Type Source Tests Collected by Time Destination   1 : Gastric Bx's Preservative Gastric  Juliane Lack, MD 06/18/2015 1104 Pathology       Heywood Tokunaga Sydell Axon, MD  June 18, 2015  11:25 AM

## 2015-06-18 NOTE — Addendum Note (Signed)
Addendum  created 06/18/15 1444 by Billy Fischer, MD    Actions taken from a BestPractice Advisory, Anesthesia Attestations filed

## 2015-06-18 NOTE — Anesthesia Pre-Procedure Evaluation (Signed)
Anesthetic History               Review of Systems / Medical History  Patient summary reviewed and pertinent labs reviewed    Pulmonary            Asthma : well controlled       Neuro/Psych         Headaches and psychiatric history     Cardiovascular    Hypertension: well controlled              Exercise tolerance: <4 METS     GI/Hepatic/Renal     GERD: poorly controlled           Endo/Other        Anemia     Other Findings              Physical Exam    Airway  Mallampati: III  TM Distance: 4 - 6 cm  Neck ROM: normal range of motion   Mouth opening: Normal     Cardiovascular    Rhythm: regular  Rate: normal         Dental    Dentition: Poor dentition     Pulmonary  Breath sounds clear to auscultation               Abdominal  GI exam deferred       Other Findings            Anesthetic Plan    ASA: 3  Anesthesia type: MAC            Anesthetic plan and risks discussed with: Patient

## 2015-06-18 NOTE — Other (Signed)
Dr. Sullivan at the bedside.

## 2015-06-18 NOTE — Other (Signed)
TRANSFER - OUT REPORT:    Verbal report given to Robin RN(name) on Sydney ProJennifer Luna  being transferred to 3000(unit) for routine post - op       Report consisted of patient???s Situation, Background, Assessment and   Recommendations(SBAR).     Information from the following report(s) SBAR, Procedure Summary and MAR was reviewed with the receiving nurse.    Lines:   Venous Access Device mediport Upper chest (subclavicular area, right (Active)   Central Line Being Utilized Yes 06/18/2015 10:21 AM   Criteria for Appropriate Use Other (comment) 06/18/2015 10:21 AM   Site Assessment Clean, dry, & intact 06/18/2015 10:21 AM   Date of Last Dressing Change 06/17/15 06/18/2015  8:00 AM   Dressing Status Clean, dry, & intact 06/18/2015 11:29 AM   Dressing Type Disk with Chlorhexadine Gluconate (CHG);Transparent 06/18/2015  8:00 AM   Action Taken Open ports on tubing capped 06/18/2015  8:00 AM   Date Accessed (Medial Site) 06/15/15 06/18/2015  8:00 AM   Positive Blood Return (Medial Site) Yes 06/18/2015  8:00 AM   Alcohol Cap Used Yes 06/18/2015  4:05 AM        Opportunity for questions and clarification was provided.      Patient transported with:   [employer]

## 2015-06-18 NOTE — Progress Notes (Signed)
[facility]  Hospitalist Division    Daily progress Note    Patient: Sydney ProJennifer Bonebrake MRN: [id number]  CSN: [id number]    Date of Birth: May 09, 1966  Age: 49 y.o.  Sex: female    DOA: 06/15/2015 LOS:  LOS: 3 days                    Interval Events:     INR down to 1.4 with ptt 29  Multiple factor studies remain pending    Assessment/Plan:     Chief Complaint:  Blood in urine and stool. Thigh hematoma     Patient Active Problem List   Diagnosis Code   ??? GI bleed K92.2   ??? 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 History of pulmonary embolism Z86.711   ??? Lupus (HCC) M32.9   ??? History of DVT (deep vein thrombosis) Z86.718   ??? Coagulopathy (HCC) D68.9   ??? Hematuria R31.9   ??? UTI (urinary tract infection) N39.0       A/P:  - Coagulopathy with INR of 5.6 and ptt of 80 - Primary problem seems to be coagulopathy with unknown cause at this point. Heme/ Onc following. Awaiting numerous factor studies. INR and ptt improving after FFP and multiple doses of Vitamin K  - GI Bleed - GI following. Continue Protonix. Plan for EGD and colonoscopy likely today  - Hematuria with possible UTI - Continue Levaquin. Urine culture negative.  - Epistaxis - ENT following. Continue Keflex with packing in place.   - Thigh Hematoma  - Acute blood loss anemia with possible chronic anemia - Follow H&H. No need for transfusion presently.   - AKI - Resolved. Stop IV fluids  - Hypokalemia - Replacement ordered.   - Asthma - continue Albuterol PRN/ Symbicort  - HTN - Continue Clonidine/ Norvasc/ Lisinopril  - Lupus - Continue Plaquenil/ Steroids  - Hx of GI Bleed - Appears to be the reason she was taken off of anticoagulation  - Hx of multiple PE's, DVT's, and an IVC Filter  - Right breast mass - Will need outpatient mammogram and follow-up  - Father with hx of hemophilia and she has hx of DIC in the past  ????  ????  Activity: OOB and Mobilize as able  DVT Prophylaxis: SCD's  GI Prophylaxis: Protonix  Care plan discussed     Subjective:      She feels well today. She notes she continues to have low abdominal discomfort and dysuria when she finishes urinating. Hematuria resolving. Denies any chest pain, shortness of breath, vomiting.     Objective:      Visit Vitals   ??? BP 151/88   ??? Pulse 89   ??? Temp 98.1 ??F (36.7 ??C)   ??? Resp 18   ??? Ht 5\' 6"  (1.676 m)   ??? Wt 122 kg (268 lb 14.4 oz)   ??? SpO2 98%   ??? BMI 43.4 kg/m2       Physical Exam:  Gen: In general, this is an obese female, in no acute distress on RA.  HEENT: Sclerae nonicteric. Oral mucous membranes moist. Holding pressure to nasal bridge  Neck: Supple with midline trachea. ??  CV: RRR without murmur or rub appreciated. ??  Resp:Respirations are unlabored without use of accessory muscles. Lung fields bilaterally without wheezes or rhonchi.  Abd: Soft, nontender, nondistended.  Extrem: Extremities are warm, without cyanosis or clubbing. No pitting pretibial edema. Palpable distal pulses X 4. ??  Skin: Warm, no visible rashes. + hematoma to right lateral thigh with small area of central oozing. Tender to palpation.  Neuro: Patient is alert, oriented, and cooperative. No obvious focal defects. Moves all 4 extremities    Intake and Output:  Current Shift:     Last three shifts:  08/16 1901 - 08/18 0700  In: 3838.3 [P.O.:240; I.V.:3598.3]  Out: 1050 [Urine:1050]    Recent Results (from the past 24 hour(s))   GLUCOSE, POC    Collection Time: 06/17/15  4:49 PM   Result Value Ref Range    Glucose (POC) 113 (H) 70 - 110 mg/dL   HGB & HCT    Collection Time: 06/17/15  8:55 PM   Result Value Ref Range    HGB 8.2 (L) 12.0 - 16.0 g/dL    HCT 98.1 (L) 19.1 - 45.0 %   GLUCOSE, POC    Collection Time: 06/17/15  9:38 PM   Result Value Ref Range    Glucose (POC) 107 70 - 110 mg/dL   PTT    Collection Time: 06/18/15 12:18 AM   Result Value Ref Range    aPTT 29.7 23.0 - 36.4 SEC   CBC WITH AUTOMATED DIFF    Collection Time: 06/18/15  5:30 AM   Result Value Ref Range    WBC 14.9 (H) 4.6 - 13.2 K/uL     RBC 3.51 (L) 4.20 - 5.30 M/uL    HGB 8.7 (L) 12.0 - 16.0 g/dL    HCT 47.8 (L) 29.5 - 45.0 %    MCV 78.3 74.0 - 97.0 FL    MCH 24.8 24.0 - 34.0 PG    MCHC 31.6 31.0 - 37.0 g/dL    RDW 62.1 (H) 30.8 - 14.5 %    PLATELET 347 135 - 420 K/uL    MPV 9.4 9.2 - 11.8 FL    NEUTROPHILS 61 40 - 73 %    LYMPHOCYTES 35 21 - 52 %    MONOCYTES 4 3 - 10 %    EOSINOPHILS 0 0 - 5 %    BASOPHILS 0 0 - 2 %    ABS. NEUTROPHILS 9.1 (H) 1.8 - 8.0 K/UL    ABS. LYMPHOCYTES 5.2 (H) 0.9 - 3.6 K/UL    ABS. MONOCYTES 0.6 0.05 - 1.2 K/UL    ABS. EOSINOPHILS 0.0 0.0 - 0.4 K/UL    ABS. BASOPHILS 0.0 0.0 - 0.06 K/UL    DF AUTOMATED     METABOLIC PANEL, BASIC    Collection Time: 06/18/15  5:30 AM   Result Value Ref Range    Sodium 143 136 - 145 mmol/L    Potassium 3.4 (L) 3.5 - 5.5 mmol/L    Chloride 107 100 - 108 mmol/L    CO2 26 21 - 32 mmol/L    Anion gap 10 3.0 - 18 mmol/L    Glucose 97 74 - 99 mg/dL    BUN 17 7.0 - 18 MG/DL    Creatinine 6.57 0.6 - 1.3 MG/DL    BUN/Creatinine ratio 15 12 - 20      GFR est AA >60 >60 ml/min/1.91m2    GFR est non-AA 50 (L) >60 ml/min/1.58m2    Calcium 9.0 8.5 - 10.1 MG/DL   PROTHROMBIN TIME + INR    Collection Time: 06/18/15  5:30 AM   Result Value Ref Range    Prothrombin time 16.6 (H) 11.5 - 15.2 sec    INR 1.4 (H) 0.8 - 1.2  PTT    Collection Time: 06/18/15  5:30 AM   Result Value Ref Range    aPTT 29.0 23.0 - 36.4 SEC   GLUCOSE, POC    Collection Time: 06/18/15  8:17 AM   Result Value Ref Range    Glucose (POC) 68 (L) 70 - 110 mg/dL       Lab Results   Component Value Date/Time    GLUCOSE 97 06/18/2015 05:30 AM    GLUCOSE 185 06/17/2015 04:00 AM    GLUCOSE 117 06/16/2015 06:25 AM    GLUCOSE 101 06/15/2015 11:33 PM    GLUCOSE 114 06/15/2015 11:30 AM        [facility] [place] [facility]  Hospitalist Division  Pager:  [phone]  Office:  [phone]

## 2015-06-18 NOTE — Addendum Note (Signed)
Addendum  created 06/18/15 1146 by Billy FischerWei Deslyn Cavenaugh, MD    Actions taken from a BestPractice Advisory, Anesthesia Review and Sign - Ready for Procedure, Anesthesia Review and Sign - Signed, Sign clinical note, Visit Navigator Flowsheet section accepted

## 2015-06-18 NOTE — Progress Notes (Signed)
Problem: Pain  Goal: *Control of Pain  Outcome: Progressing Towards Goal  Pt c/o pain 6/10 to RLQ of abd. On pain regimen of q4h. Pending colonoscopy and EGD. Will continue to monitor.

## 2015-06-18 NOTE — Progress Notes (Signed)
Problem: Self Care Deficits Care Plan (Adult)  Goal: *Acute Goals and Plan of Care (Insert Text)  Occupational Therapy Goals  Initiated 06/17/2015 within 7 day(s).    1. Patient will perform self-feeding and grooming seated in chair with modified independence  2. Patient will perform bathing with modified independence.  3. Patient will perform upper body dressing with modified independence.  4. Patient will perform lower body dressing with modified independence.  5. Patient will perform toilet transfers with modified independence.  6. Patient will perform all aspects of toileting with modified independence.  7. Patient will participate in upper extremity therapeutic exercise/activities with modified independence for 5-10 minutes.   8. Patient will utilize energy conservation techniques during functional activities with verbal cues.   Outcome: Progressing Towards Goal  OCCUPATIONAL THERAPY TREATMENT     Patient: Sydney Luna (96(49 y.o. female)  Date: 06/18/2015  Diagnosis: GI bleed  GI Bleed Coagulopathy (HCC)    Procedure(s) (LRB):  ESOPHAGOGASTRODUODENOSCOPY (EGD) (N/A)  COLONOSCOPY (N/A) Day of Surgery  Precautions: Fall  Chart, occupational therapy assessment, plan of care, and goals were reviewed.      ASSESSMENT:  Pt was bale to participate in therapeutic activity in/out of room. Pt was also able to participate in UB/LB dressing.     Patient Education:  Role of OT in acute care, safety awareness and fall prevention     Progression toward goals:  [X]           Improving appropriately and progressing toward goals  [ ]           Improving slowly and progressing toward goals  [ ]           Not making progress toward goals and plan of care will be adjusted       PLAN:  Patient continues to benefit from skilled intervention to address the above impairments. Continue treatment per established plan of care.  Discharge Recommendations:  Rehab, Home Health, Skilled Nursing Facility and To Be Determined   Further Equipment Recommendations for Discharge:  N/A       SUBJECTIVE:   Patient stated ???I ordered some chocolate chip cookies.???      OBJECTIVE DATA SUMMARY:   Cognitive/Behavioral Status:  Neurologic State: Alert  Orientation Level: Oriented X4  Cognition: Appropriate decision making, Appropriate for age attention/concentration, Appropriate safety awareness  Safety/Judgement: Awareness of environment     Balance:     ADL Intervention:   UB dressing: s/u (hospital gown)  LB dressing: s/u (socks)     Pain:  Pre-treatment pain: 3/10  Post Treatment pain: 3/10     Activity Tolerance:    Pt was able to participate in functional activity tolerance. Standing and marching and walking to the nursing station and back in preparation for functional ADL transfer     Please refer to the flowsheet for vital signs taken during this treatment.  After treatment:   [X]   Patient left in no apparent distress sitting up in chair  [X]   Patient left in no apparent distress in bed  [X]   Call bell left within reach  [ ]   Nursing notified  [ ]   Caregiver present  [ ]   Bed alarm activated     Theresa MulliganAmanda Harrison MS, OTR/L  Time Calculation: 53 mins

## 2015-06-19 LAB — FACTOR IX ACTIVITY: Factor IX Activity: 122 % (ref 55–150)

## 2015-06-19 LAB — PTT: aPTT: 29 s (ref 23.0–36.4)

## 2015-06-19 LAB — FACTOR X ACTIVITY: Factor X Activity: 150 % — ABNORMAL HIGH (ref 65–140)

## 2015-06-19 LAB — FACTOR V ACTIVITY: Factor V Activity: 64 % (ref 60–140)

## 2015-06-19 LAB — HGB & HCT
HCT: 28.1 % — ABNORMAL LOW (ref 35.0–45.0)
HCT: 28.6 % — ABNORMAL LOW (ref 35.0–45.0)
HGB: 8.9 g/dL — ABNORMAL LOW (ref 12.0–16.0)
HGB: 9.1 g/dL — ABNORMAL LOW (ref 12.0–16.0)

## 2015-06-19 LAB — GLUCOSE, POC
Glucose (POC): 96 mg/dL (ref 70–110)
Glucose (POC): 84 mg/dL (ref 70–110)
Glucose (POC): 167 mg/dL — ABNORMAL HIGH (ref 70–110)

## 2015-06-19 LAB — PROTHROMBIN TIME + INR
INR: 1.2 (ref 0.8–1.2)
Prothrombin time: 14.8 s (ref 11.5–15.2)

## 2015-06-19 LAB — FACTOR VII ACTIVITY: Factor VII Activity: 108 % (ref 50–150)

## 2015-06-19 LAB — FACTOR II ACTIVITY: Factor II Activity: 89 % (ref 75–130)

## 2015-06-19 MED ORDER — PANTOPRAZOLE 40 MG TAB, DELAYED RELEASE
40 mg | ORAL_TABLET | Freq: Two times a day (BID) | ORAL | 0 refills | Status: AC
Start: 2015-06-19 — End: ?

## 2015-06-19 MED ORDER — CEPHALEXIN 500 MG CAP
500 mg | ORAL_CAPSULE | Freq: Three times a day (TID) | ORAL | 0 refills | Status: AC
Start: 2015-06-19 — End: 2015-06-23

## 2015-06-19 MED ORDER — SODIUM CHLORIDE 0.65 % NASAL SPRAY AEROSOL
0.65 % | NASAL | 0 refills | Status: AC
Start: 2015-06-19 — End: ?

## 2015-06-19 MED ORDER — OXYCODONE-ACETAMINOPHEN 5 MG-325 MG TAB
5-325 mg | ORAL_TABLET | ORAL | 0 refills | Status: AC | PRN
Start: 2015-06-19 — End: ?

## 2015-06-19 MED ORDER — HEPARIN, PORCINE (PF) 100 UNIT/ML IV SYRINGE
100 unit/mL | INTRAVENOUS | Status: AC
Start: 2015-06-19 — End: 2015-06-19
  Administered 2015-06-19: 20:00:00

## 2015-06-19 MED FILL — OXYCODONE-ACETAMINOPHEN 5 MG-325 MG TAB: 5-325 mg | ORAL | Qty: 1

## 2015-06-19 MED FILL — PANTOPRAZOLE 40 MG TAB, DELAYED RELEASE: 40 mg | ORAL | Qty: 1

## 2015-06-19 MED FILL — HEPARIN LOCK FLUSH (PORCINE) (PF) 100 UNIT/ML INTRAVENOUS SYRINGE: 100 unit/mL | INTRAVENOUS | Qty: 5

## 2015-06-19 MED FILL — DIPHENHYDRAMINE HCL 50 MG/ML IJ SOLN: 50 mg/mL | INTRAMUSCULAR | Qty: 1

## 2015-06-19 MED FILL — ZOLPIDEM 5 MG TAB: 5 mg | ORAL | Qty: 2

## 2015-06-19 MED FILL — CEPHALEXIN 250 MG CAP: 250 mg | ORAL | Qty: 2

## 2015-06-19 MED FILL — GABAPENTIN 300 MG CAP: 300 mg | ORAL | Qty: 1

## 2015-06-19 MED FILL — BUDESONIDE 0.5 MG/2 ML NEB SUSPENSION: 0.5 mg/2 mL | RESPIRATORY_TRACT | Qty: 1

## 2015-06-19 MED FILL — THERA 400 MCG TABLET: 400 mcg | ORAL | Qty: 1

## 2015-06-19 MED FILL — CLONIDINE 0.1 MG TAB: 0.1 mg | ORAL | Qty: 2

## 2015-06-19 MED FILL — FERROUS SULFATE 325 MG (65 MG ELEMENTAL IRON) TAB: 325 mg (65 mg iron) | ORAL | Qty: 1

## 2015-06-19 MED FILL — HYDROMORPHONE (PF) 1 MG/ML IJ SOLN: 1 mg/mL | INTRAMUSCULAR | Qty: 1

## 2015-06-19 MED FILL — LISINOPRIL 20 MG TAB: 20 mg | ORAL | Qty: 1

## 2015-06-19 MED FILL — BROVANA 15 MCG/2 ML SOLUTION FOR NEBULIZATION: 15 mcg/2 mL | RESPIRATORY_TRACT | Qty: 2

## 2015-06-19 MED FILL — HYDROXYCHLOROQUINE 200 MG TAB: 200 mg | ORAL | Qty: 1

## 2015-06-19 MED FILL — METHYLPREDNISOLONE 4 MG TAB: 4 mg | ORAL | Qty: 3

## 2015-06-19 MED FILL — AMLODIPINE 10 MG TAB: 10 mg | ORAL | Qty: 1

## 2015-06-19 MED FILL — ONDANSETRON (PF) 4 MG/2 ML INJECTION: 4 mg/2 mL | INTRAMUSCULAR | Qty: 2

## 2015-06-19 MED FILL — MONTELUKAST 10 MG TAB: 10 mg | ORAL | Qty: 1

## 2015-06-19 NOTE — Progress Notes (Signed)
Spoke with patient . Her plan remains home at discharge. Lew Dawes RN ext [phone]

## 2015-06-19 NOTE — Progress Notes (Signed)
Problem: Self Care Deficits Care Plan (Adult)  Goal: *Acute Goals and Plan of Care (Insert Text)  Occupational Therapy Goals  Initiated 06/17/2015 within 7 day(s).    1. Patient will perform self-feeding and grooming seated in chair with modified independence  2. Patient will perform bathing with modified independence.  3. Patient will perform upper body dressing with modified independence.  4. Patient will perform lower body dressing with modified independence.  5. Patient will perform toilet transfers with modified independence.  6. Patient will perform all aspects of toileting with modified independence.  7. Patient will participate in upper extremity therapeutic exercise/activities with modified independence for 5-10 minutes.   8. Patient will utilize energy conservation techniques during functional activities with verbal cues.   Outcome: Progressing Towards Goal  OCCUPATIONAL THERAPY TREATMENT     Patient: Sydney Luna (49 y.o. female)  Date: 06/19/2015  Diagnosis: GI bleed  GI Bleed Coagulopathy (HCC)    Procedure(s) (LRB):  ESOPHAGOGASTRODUODENOSCOPY (EGD) (N/A)  COLONOSCOPY (N/A) 1 Day Post-Op  Precautions: Fall  Chart, occupational therapy assessment, plan of care, and goals were reviewed.      ASSESSMENT:  Pt is pleasant and cooperative, motivated for discharge home. Pt c/o discomfort RLE, agreeable to therapy. Pt progressing w/OT, meeting goals 4,5,6, and 8 this session. Pt presents w/RLE length discrepancy and reports consulting w/[facility] for AFO. Pt requires min vc's for safety w/RW and functional mobility to bathroom and tolerates standing sinkside performing hand hygiene. Pt demonstrates energy conservation techniques donning/doffing socks long sitting in bed. Pt tachycardiac 120s w/standing activity.  EDUCATION   Progression toward goals:  [X]           Improving appropriately and progressing toward goals  [ ]           Improving slowly and progressing toward goals   [ ]           Not making progress toward goals and plan of care will be adjusted       PLAN:  Patient continues to benefit from skilled intervention to address the above impairments. Continue treatment per established plan of care.  Discharge Recommendations:  Home Health  Further Equipment Recommendations for Discharge:  N/A, pt reports she has DME       SUBJECTIVE:   Patient stated ???I'm going home today at 3 o'clock.???      OBJECTIVE DATA SUMMARY:         Cognitive/Behavioral Status:  Neurologic State: Alert  Orientation Level: Oriented X4  Cognition: Appropriate for age attention/concentration, Follows commands  Safety/Judgement: Awareness of environment  Functional Mobility and Transfers for ADLs:              Bed Mobility:  Supine to Sit: Modified independent  Sit to Supine: Modified independent              Transfers:  Sit to Stand: Corporate investment banker : Modified independent (w/grab bar)              Bathroom Mobility: Stand-by assistance (w/RW)              Balance:  Sitting: Intact  Standing: With support  Standing - Static: Good  Standing - Dynamic : Fair (fair plus)  ADL Intervention:  Grooming  Washing Hands: Supervision/set-up (standing sinkside)     Lower Body Dressing Assistance  Socks: Modified independent  Leg Crossed Method Used: Yes  Position Performed: Long sitting on  bed  Cues: Don;Doff     Toileting  Toileting Assistance: Modified independent     Pain:  Pre Treatment:0  Post Treatment:0     Activity Tolerance:    Good     Please refer to the flowsheet for vital signs taken during this treatment.  After treatment:   [ ]   Patient left in no apparent distress sitting up in chair  [X]   Patient left in no apparent distress in bed  [X]   Call bell left within reach  [ ]   Nursing notified  [ ]   Caregiver present  [ ]   Bed alarm activated     Leann  Dextradeur, COTA  Time Calculation: 23 mins

## 2015-06-19 NOTE — Progress Notes (Signed)
0945: Pt refused scheduled Brovana and Pulmicort treatment. Stated she feels fine and doesn't want the treatments.

## 2015-06-19 NOTE — Progress Notes (Signed)
Offered the pt FOC for home care, she chose [facility].  Pt verified the contact information on the face sheet is correct.  Referral sent to intake via [facility] and called to the [facility] for f/u.

## 2015-06-19 NOTE — Progress Notes (Signed)
Pt awaiting ride for discharge. Paperwork reviewed with pt.
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
## 2015-06-19 NOTE — Other (Signed)
0700- Report received Bernardo Heater, RN. Pt AA&Ox4.

## 2015-06-19 NOTE — Progress Notes (Signed)
Care Management Interventions  PCP Verified by CM: Yes  Last Visit to PCP: 06/09/15  Mode of Transport at Discharge: Other (see comment) (friend)  Transition of Care Consult (CM Consult): Discharge Planning  Current Support Network: Other (lives in apt with friend)  Confirm Follow Up Transport: Self  Plan discussed with Pt/Family/Caregiver: Yes  Discharge Location  Discharge Placement: Home

## 2015-06-19 NOTE — Progress Notes (Signed)
Gastrointestinal Progress Note    Patient Name: Sydney Luna    ZOXWR'U Date: 06/19/2015    Admit Date: 06/15/2015    Subjective:     Sydney Luna is a 49 y.o. Female with coagulopathy related to vitamin K deficiency.  She had mild bleeding in setting and anemia.  She had INR corrected.  EGD noted gastritis.  Colonoscopy noted hemorrhoids.  No active bleeding source noted.  No further bleeding noted.  No new problems.           Current Facility-Administered Medications   Medication Dose Route Frequency   ??? pantoprazole (PROTONIX) tablet 40 mg  40 mg Oral BID   ??? levofloxacin (LEVAQUIN) 750 mg in D5W IVPB  750 mg IntraVENous Q24H   ??? HYDROmorphone (PF) (DILAUDID) injection 0.5 mg  0.5 mg IntraVENous Q4H PRN   ??? 0.9% sodium chloride infusion 250 mL  250 mL IntraVENous PRN   ??? diphenhydrAMINE (BENADRYL) injection 12.5 mg  12.5 mg IntraVENous Q6H PRN   ??? sodium chloride (OCEAN) 0.65 % nasal spray 2 Spray  2 Spray Both Nostrils Q2HWA   ??? cephALEXin (KEFLEX) capsule 500 mg  500 mg Oral TID   ??? bacitracin-polymyxin b (POLYSPORIN) 500-10,000 unit/gram ointment   Topical BID   ??? oxymetazoline (AFRIN) 0.05 % nasal spray 2 Spray  2 Spray Both Nostrils BID   ??? lisinopril (PRINIVIL, ZESTRIL) tablet 20 mg  20 mg Oral DAILY   ??? insulin lispro (HUMALOG) injection   SubCUTAneous AC&HS   ??? glucose chewable tablet 16 g  4 Tab Oral PRN   ??? glucagon (GLUCAGEN) injection 1 mg  1 mg IntraMUSCular PRN   ??? dextrose (D50W) injection syrg 12.5-25 g  25-50 mL IntraVENous PRN   ??? zolpidem (AMBIEN) tablet 10 mg  10 mg Oral QHS PRN   ??? acetaminophen (TYLENOL) tablet 650 mg  650 mg Oral Q4H PRN   ??? ondansetron (ZOFRAN) injection 4 mg  4 mg IntraVENous Q4H PRN   ??? albuterol (PROVENTIL VENTOLIN) nebulizer solution 2.5 mg  2.5 mg Nebulization Q4H PRN   ??? amLODIPine (NORVASC) tablet 10 mg  10 mg Oral DAILY   ??? cloNIDine HCl (CATAPRES) tablet 0.2 mg  0.2 mg Oral BID   ??? ferrous sulfate tablet 325 mg  325 mg Oral BID    ??? gabapentin (NEURONTIN) capsule 300 mg  300 mg Oral TID   ??? hydroxychloroquine (PLAQUENIL) tablet 200 mg  200 mg Oral DAILY   ??? methylPREDNISolone (MEDROL) tablet 12 mg  12 mg Oral DAILY WITH BREAKFAST   ??? montelukast (SINGULAIR) tablet 10 mg  10 mg Oral DAILY   ??? oxyCODONE-acetaminophen (PERCOCET) 5-325 mg per tablet 1 Tab  1 Tab Oral Q4H PRN   ??? therapeutic multivitamin (THERAGRAN) tablet 1 Tab  1 Tab Oral DAILY   ??? budesonide (PULMICORT) 500 mcg/2 ml nebulizer suspension  500 mcg Nebulization BID RT   ??? arformoterol (BROVANA) neb solution 15 mcg  15 mcg Nebulization BID RT          Objective:     Physical Exam:    Visit Vitals   ??? BP 141/85 (BP 1 Location: Left arm, BP Patient Position: At rest)   ??? Pulse 86   ??? Temp 98.1 ??F (36.7 ??C)   ??? Resp 16   ??? Ht 5\' 6"  (1.676 m)   ??? Wt 122 kg (268 lb 14.4 oz)   ??? SpO2 96%   ??? BMI 43.4 kg/m2     General  appearance: alert, cooperative, no distress, appears stated age  Abdomen: soft, non-tender. Bowel sounds normal. No masses,  no organomegaly    Data Review:    Labs: Results:       Chemistry Recent Labs      06/18/15   0530  06/17/15   0400   GLU  97  185*   NA  143  142   K  3.4*  3.9   CL  107  108   CO2  26  25   BUN  17  17   CREA  1.14  1.24   CA  9.0  8.7   AGAP  10  9   BUCR  15  14      CBC w/Diff Recent Labs      06/18/15   2250  06/18/15   1420  06/18/15   0530   06/17/15   0400   WBC   --    --   14.9*   --   10.3   RBC   --    --   3.51*   --   3.44*   HGB  8.9*  7.2*  8.7*   < >  8.4*   HCT  28.1*  22.7*  27.5*   < >  26.8*   PLT   --    --   347   --   355   GRANS   --    --   61   --   82*   LYMPH   --    --   35   --   16*   EOS   --    --   0   --   0    < > = values in this interval not displayed.      Coagulation Recent Labs      06/19/15   0450  06/18/15   0530   PTP  14.8  16.6*   INR  1.2  1.4*   APTT  29.0  29.0       Liver Enzymes No results for input(s): TP, ALB, TBIL, AP, SGOT, ALT in the last 72 hours.    No lab exists for component: DBIL        Assessment:   1. GI bleeding:  She had mild bleeding and anemia in setting of coagulopathy with vitamin K deficiency.   No further bleeding since corrected.  EGD noted mild gastritis and colonoscopy noted hemorrhoids.  No bleeding source noted.  Will sign off, please call with questions.     Recommendation:   1. Continue to observe for rebleeding.   2. Will sign off, please call with questions.         Juliane Lack, MD  June 19, 2015

## 2015-06-19 NOTE — Progress Notes (Signed)
Patient requested stretcher transport home Informed her will not [employer] insurance will cover & if not she would be billed. She stated she always goes that way transport per her request arranged for 4 pm D Blackwood RN ext [phone]

## 2015-06-19 NOTE — Other (Signed)
Bedside and Verbal shift change report given to Robin, RN (oncoming nurse) by ROGER LIVINGSTON   (offgoing nurse). Report included the following information SBAR, Kardex and MAR.

## 2015-06-19 NOTE — Progress Notes (Signed)
Problem: Pain  Goal: *Control of Pain  Outcome: Progressing Towards Goal  Pt c/o pain to abd. Well controlled with Dilaudid regimen.

## 2015-06-22 ENCOUNTER — Encounter: Primary: Family Medicine

## 2015-11-19 ENCOUNTER — Emergency Department (HOSPITAL_COMMUNITY): Payer: Medicare Other

## 2015-11-19 ENCOUNTER — Encounter (HOSPITAL_COMMUNITY): Payer: Self-pay | Admitting: Emergency Medicine

## 2015-11-19 ENCOUNTER — Inpatient Hospital Stay (HOSPITAL_COMMUNITY)
Admission: EM | Admit: 2015-11-19 | Discharge: 2015-12-01 | DRG: 571 | Disposition: A | Discharge status: Skilled Nursing Facility | Payer: Medicare Other | Attending: Internal Medicine | Admitting: Internal Medicine

## 2015-11-19 DIAGNOSIS — N390 Urinary tract infection, site not specified: Secondary | ICD-10-CM | POA: Diagnosis present

## 2015-11-19 DIAGNOSIS — M329 Systemic lupus erythematosus, unspecified: Secondary | ICD-10-CM | POA: Diagnosis present

## 2015-11-19 DIAGNOSIS — R32 Unspecified urinary incontinence: Secondary | ICD-10-CM | POA: Diagnosis present

## 2015-11-19 DIAGNOSIS — R079 Chest pain, unspecified: Secondary | ICD-10-CM | POA: Diagnosis not present

## 2015-11-19 DIAGNOSIS — E860 Dehydration: Secondary | ICD-10-CM

## 2015-11-19 DIAGNOSIS — Z9114 Patient's other noncompliance with medication regimen: Secondary | ICD-10-CM

## 2015-11-19 DIAGNOSIS — R7989 Other specified abnormal findings of blood chemistry: Secondary | ICD-10-CM

## 2015-11-19 DIAGNOSIS — Z7952 Long term (current) use of systemic steroids: Secondary | ICD-10-CM

## 2015-11-19 DIAGNOSIS — I10 Essential (primary) hypertension: Secondary | ICD-10-CM | POA: Diagnosis present

## 2015-11-19 DIAGNOSIS — M8668 Other chronic osteomyelitis, other site: Secondary | ICD-10-CM | POA: Diagnosis present

## 2015-11-19 DIAGNOSIS — J45909 Unspecified asthma, uncomplicated: Secondary | ICD-10-CM | POA: Diagnosis present

## 2015-11-19 DIAGNOSIS — Z6841 Body Mass Index (BMI) 40.0 and over, adult: Secondary | ICD-10-CM

## 2015-11-19 DIAGNOSIS — M8660 Other chronic osteomyelitis, unspecified site: Secondary | ICD-10-CM | POA: Diagnosis present

## 2015-11-19 DIAGNOSIS — IMO0002 Reserved for concepts with insufficient information to code with codable children: Secondary | ICD-10-CM | POA: Insufficient documentation

## 2015-11-19 DIAGNOSIS — M25551 Pain in right hip: Secondary | ICD-10-CM

## 2015-11-19 DIAGNOSIS — R700 Elevated erythrocyte sedimentation rate: Secondary | ICD-10-CM

## 2015-11-19 DIAGNOSIS — B9562 Methicillin resistant Staphylococcus aureus infection as the cause of diseases classified elsewhere: Secondary | ICD-10-CM | POA: Diagnosis present

## 2015-11-19 DIAGNOSIS — Z87891 Personal history of nicotine dependence: Secondary | ICD-10-CM

## 2015-11-19 DIAGNOSIS — Z95828 Presence of other vascular implants and grafts: Secondary | ICD-10-CM | POA: Diagnosis present

## 2015-11-19 DIAGNOSIS — Z955 Presence of coronary angioplasty implant and graft: Secondary | ICD-10-CM

## 2015-11-19 DIAGNOSIS — L02415 Cutaneous abscess of right lower limb: Secondary | ICD-10-CM | POA: Diagnosis not present

## 2015-11-19 DIAGNOSIS — T380X5A Adverse effect of glucocorticoids and synthetic analogues, initial encounter: Secondary | ICD-10-CM | POA: Diagnosis present

## 2015-11-19 DIAGNOSIS — M25559 Pain in unspecified hip: Secondary | ICD-10-CM

## 2015-11-19 DIAGNOSIS — E099 Drug or chemical induced diabetes mellitus without complications: Secondary | ICD-10-CM | POA: Diagnosis present

## 2015-11-19 DIAGNOSIS — H109 Unspecified conjunctivitis: Secondary | ICD-10-CM | POA: Diagnosis not present

## 2015-11-19 DIAGNOSIS — N179 Acute kidney failure, unspecified: Secondary | ICD-10-CM | POA: Diagnosis present

## 2015-11-19 DIAGNOSIS — Z86718 Personal history of other venous thrombosis and embolism: Secondary | ICD-10-CM

## 2015-11-19 DIAGNOSIS — N63 Unspecified lump in breast: Secondary | ICD-10-CM | POA: Diagnosis present

## 2015-11-19 DIAGNOSIS — Z8542 Personal history of malignant neoplasm of other parts of uterus: Secondary | ICD-10-CM

## 2015-11-19 HISTORY — DX: Malignant neoplasm of uterus, part unspecified: C55

## 2015-11-19 HISTORY — DX: Migraine, unspecified, not intractable, without status migrainosus: G43.909

## 2015-11-19 HISTORY — DX: Systemic lupus erythematosus, unspecified: M32.9

## 2015-11-19 HISTORY — DX: Other pulmonary embolism without acute cor pulmonale: I26.99

## 2015-11-19 HISTORY — DX: Urinary tract infection, site not specified: N39.0

## 2015-11-19 HISTORY — DX: Unspecified convulsions: R56.9

## 2015-11-19 HISTORY — DX: Acute embolism and thrombosis of unspecified deep veins of unspecified lower extremity: I82.409

## 2015-11-19 HISTORY — DX: Unspecified chronic bronchitis: J42

## 2015-11-19 HISTORY — DX: Personal history of other medical treatment: Z92.89

## 2015-11-19 HISTORY — DX: Essential (primary) hypertension: I10

## 2015-11-19 HISTORY — DX: Idiopathic aseptic necrosis of unspecified bone: M87.00

## 2015-11-19 HISTORY — DX: Hematuria, unspecified: R31.9

## 2015-11-19 HISTORY — DX: Unspecified lump in the right breast, unspecified quadrant: N63.10

## 2015-11-19 HISTORY — DX: Post-traumatic stress disorder, unspecified: F43.10

## 2015-11-19 HISTORY — DX: Anxiety disorder, unspecified: F41.9

## 2015-11-19 HISTORY — DX: Unspecified osteoarthritis, unspecified site: M19.90

## 2015-11-19 HISTORY — DX: Gastro-esophageal reflux disease without esophagitis: K21.9

## 2015-11-19 HISTORY — DX: Tubulo-interstitial nephritis, not specified as acute or chronic: N12

## 2015-11-19 HISTORY — DX: Adverse effect of unspecified anesthetic, initial encounter: T41.45XA

## 2015-11-19 HISTORY — DX: Unspecified asthma, uncomplicated: J45.909

## 2015-11-19 HISTORY — DX: Pneumonia, unspecified organism: J18.9

## 2015-11-19 HISTORY — DX: Other complications of anesthesia, initial encounter: T88.59XA

## 2015-11-19 LAB — BASIC METABOLIC PANEL
Anion gap: 17 — ABNORMAL HIGH (ref 5–15)
BUN: 24 mg/dL — AB (ref 6–20)
CALCIUM: 9.6 mg/dL (ref 8.9–10.3)
CO2: 21 mmol/L — AB (ref 22–32)
CREATININE: 1.28 mg/dL — AB (ref 0.44–1.00)
Chloride: 102 mmol/L (ref 101–111)
GFR calc non Af Amer: 48 mL/min — ABNORMAL LOW (ref >60)
GFR, EST AFRICAN AMERICAN: 55 mL/min — AB (ref >60)
GLUCOSE: 96 mg/dL (ref 65–99)
Potassium: 3.5 mmol/L (ref 3.5–5.1)
Sodium: 140 mmol/L (ref 135–145)

## 2015-11-19 LAB — URINALYSIS, ROUTINE W REFLEX MICROSCOPIC
Glucose, UA: NEGATIVE mg/dL
Hgb urine dipstick: NEGATIVE
Ketones, ur: 40 mg/dL — AB
LEUKOCYTES UA: NEGATIVE
NITRITE: NEGATIVE
PH: 6 (ref 5.0–8.0)
Protein, ur: 30 mg/dL — AB
SPECIFIC GRAVITY, URINE: 1.035 — AB (ref 1.005–1.030)

## 2015-11-19 LAB — I-STAT TROPONIN, ED: Troponin i, poc: 0.01 ng/mL (ref 0.00–0.08)

## 2015-11-19 LAB — CBC WITH DIFFERENTIAL/PLATELET
Basophils Absolute: 0 10*3/uL (ref 0.0–0.1)
Basophils Relative: 0 %
EOS PCT: 0 %
Eosinophils Absolute: 0 10*3/uL (ref 0.0–0.7)
HEMATOCRIT: 31.5 % — AB (ref 36.0–46.0)
Hemoglobin: 10.2 g/dL — ABNORMAL LOW (ref 12.0–15.0)
LYMPHS PCT: 14 %
Lymphs Abs: 2.3 10*3/uL (ref 0.7–4.0)
MCH: 24.9 pg — AB (ref 26.0–34.0)
MCHC: 32.4 g/dL (ref 30.0–36.0)
MCV: 76.8 fL — ABNORMAL LOW (ref 78.0–100.0)
MONO ABS: 0.8 10*3/uL (ref 0.1–1.0)
MONOS PCT: 5 %
NEUTROS ABS: 13.2 10*3/uL — AB (ref 1.7–7.7)
Neutrophils Relative %: 81 %
Platelets: 346 10*3/uL (ref 150–400)
RBC: 4.1 MIL/uL (ref 3.87–5.11)
RDW: 17.5 % — AB (ref 11.5–15.5)
WBC: 16.3 10*3/uL — ABNORMAL HIGH (ref 4.0–10.5)

## 2015-11-19 LAB — URINE MICROSCOPIC-ADD ON

## 2015-11-19 LAB — CBC
HCT: 39.7 % (ref 36.0–46.0)
Hemoglobin: 13 g/dL (ref 12.0–15.0)
MCH: 25.5 pg — AB (ref 26.0–34.0)
MCHC: 32.7 g/dL (ref 30.0–36.0)
MCV: 77.8 fL — AB (ref 78.0–100.0)
PLATELETS: 400 10*3/uL (ref 150–400)
RBC: 5.1 MIL/uL (ref 3.87–5.11)
RDW: 17.3 % — ABNORMAL HIGH (ref 11.5–15.5)
WBC: 18 10*3/uL — ABNORMAL HIGH (ref 4.0–10.5)

## 2015-11-19 LAB — SEDIMENTATION RATE: Sed Rate: 55 mm/hr — ABNORMAL HIGH (ref 0–22)

## 2015-11-19 LAB — TROPONIN I: Troponin I: <0.03 ng/mL (ref <0.031)

## 2015-11-19 LAB — I-STAT CG4 LACTIC ACID, ED: LACTIC ACID, VENOUS: 1.69 mmol/L (ref 0.5–2.0)

## 2015-11-19 MED ORDER — DIPHENHYDRAMINE HCL 25 MG PO CAPS: 25.0000 mg | ORAL_CAPSULE | Freq: Every evening | ORAL | Status: DC | PRN

## 2015-11-19 MED ORDER — CYCLOBENZAPRINE HCL 10 MG PO TABS
10.0000 mg | ORAL_TABLET | Freq: Three times a day (TID) | ORAL | Status: DC | PRN
  Administered 2015-11-24 – 2015-11-30 (×6): 10 mg via ORAL
  Filled 2015-11-19 (×7): qty 1

## 2015-11-19 MED ORDER — MONTELUKAST SODIUM 10 MG PO TABS
10.0000 mg | ORAL_TABLET | Freq: Every day | ORAL | Status: DC
  Administered 2015-11-20 – 2015-12-01 (×11): 10 mg via ORAL
  Filled 2015-11-19 (×11): qty 1

## 2015-11-19 MED ORDER — AMLODIPINE BESYLATE 5 MG PO TABS
5.0000 mg | ORAL_TABLET | Freq: Every day | ORAL | Status: DC
  Administered 2015-11-20 – 2015-11-30 (×8): 5 mg via ORAL
  Filled 2015-11-19 (×11): qty 1

## 2015-11-19 MED ORDER — PREDNISONE 50 MG PO TABS
60.0000 mg | ORAL_TABLET | Freq: Every day | ORAL | Status: DC
  Administered 2015-11-20 – 2015-11-27 (×7): 60 mg via ORAL
  Filled 2015-11-19 (×8): qty 1

## 2015-11-19 MED ORDER — CLONIDINE HCL 0.2 MG PO TABS
0.2000 mg | ORAL_TABLET | Freq: Two times a day (BID) | ORAL | Status: DC
  Administered 2015-11-19 – 2015-12-01 (×24): 0.2 mg via ORAL
  Filled 2015-11-19 (×24): qty 1

## 2015-11-19 MED ORDER — SODIUM CHLORIDE 0.9 % IJ SOLN
10.0000 mL | INTRAMUSCULAR | Status: DC | PRN
  Administered 2015-11-29 – 2015-12-01 (×3): 10 mL
  Filled 2015-11-19 (×3): qty 40

## 2015-11-19 MED ORDER — ENSURE ENLIVE PO LIQD: 237.0000 mL | Freq: Two times a day (BID) | ORAL | Status: DC

## 2015-11-19 MED ORDER — ONDANSETRON HCL 4 MG/2ML IJ SOLN
4.0000 mg | Freq: Once | INTRAMUSCULAR | Status: AC
  Administered 2015-11-19: 4 mg via INTRAVENOUS
  Filled 2015-11-19: qty 2

## 2015-11-19 MED ORDER — TRAMADOL HCL 50 MG PO TABS
50.0000 mg | ORAL_TABLET | ORAL | Status: DC | PRN
  Administered 2015-11-20 – 2015-11-24 (×9): 50 mg via ORAL
  Filled 2015-11-19 (×10): qty 1

## 2015-11-19 MED ORDER — OXYCODONE HCL 5 MG PO TABS
15.0000 mg | ORAL_TABLET | Freq: Four times a day (QID) | ORAL | Status: DC | PRN
  Administered 2015-11-19 – 2015-11-24 (×5): 15 mg via ORAL
  Filled 2015-11-19 (×7): qty 3

## 2015-11-19 MED ORDER — ALBUTEROL SULFATE (2.5 MG/3ML) 0.083% IN NEBU
2.5000 mg | INHALATION_SOLUTION | Freq: Three times a day (TID) | RESPIRATORY_TRACT | Status: DC
Start: 2015-11-19 — End: 2015-11-20
  Administered 2015-11-19: 2.5 mg via RESPIRATORY_TRACT
  Filled 2015-11-19: qty 3

## 2015-11-19 MED ORDER — HYDROMORPHONE HCL 1 MG/ML IJ SOLN
0.5000 mg | INTRAMUSCULAR | Status: DC | PRN
  Administered 2015-11-19 – 2015-11-22 (×13): 0.5 mg via INTRAVENOUS
  Filled 2015-11-19 (×13): qty 1

## 2015-11-19 MED ORDER — SODIUM CHLORIDE 0.9 % IV SOLN
INTRAVENOUS | Status: DC
  Administered 2015-11-20 (×3): via INTRAVENOUS

## 2015-11-19 MED ORDER — DICYCLOMINE HCL 20 MG PO TABS
20.0000 mg | ORAL_TABLET | Freq: Two times a day (BID) | ORAL | Status: DC | PRN
  Filled 2015-11-19: qty 1

## 2015-11-19 MED ORDER — SODIUM CHLORIDE 0.9 % IV BOLUS (SEPSIS)
1000.0000 mL | Freq: Once | INTRAVENOUS | Status: AC
  Administered 2015-11-19: 1000 mL via INTRAVENOUS

## 2015-11-19 MED ORDER — HYDROMORPHONE HCL 1 MG/ML IJ SOLN
1.0000 mg | Freq: Once | INTRAMUSCULAR | Status: AC
  Administered 2015-11-19: 1 mg via INTRAVENOUS
  Filled 2015-11-19: qty 1

## 2015-11-19 MED ORDER — HEPARIN SODIUM (PORCINE) 5000 UNIT/ML IJ SOLN
5000.0000 [IU] | Freq: Three times a day (TID) | INTRAMUSCULAR | Status: DC
  Administered 2015-11-19 – 2015-11-29 (×26): 5000 [IU] via SUBCUTANEOUS
  Filled 2015-11-19 (×26): qty 1

## 2015-11-19 MED ORDER — ONDANSETRON 4 MG PO TBDP
4.0000 mg | ORAL_TABLET | Freq: Three times a day (TID) | ORAL | Status: DC | PRN
  Administered 2015-11-19 – 2015-11-21 (×2): 4 mg via ORAL
  Filled 2015-11-19 (×4): qty 1

## 2015-11-19 MED ORDER — DIPHENHYDRAMINE HCL (SLEEP) 25 MG PO TABS: 25.0000 mg | ORAL_TABLET | Freq: Every evening | ORAL | Status: DC | PRN

## 2015-11-19 MED ORDER — ZOLPIDEM TARTRATE 5 MG PO TABS
10.0000 mg | ORAL_TABLET | Freq: Every day | ORAL | Status: DC
  Administered 2015-11-19 – 2015-11-30 (×12): 10 mg via ORAL
  Filled 2015-11-19 (×12): qty 2

## 2015-11-19 MED ORDER — HYDROXYCHLOROQUINE SULFATE 200 MG PO TABS
200.0000 mg | ORAL_TABLET | Freq: Two times a day (BID) | ORAL | Status: DC
  Administered 2015-11-19 – 2015-12-01 (×24): 200 mg via ORAL
  Filled 2015-11-19 (×24): qty 1

## 2015-11-19 MED ORDER — ADULT MULTIVITAMIN W/MINERALS CH
1.0000 | ORAL_TABLET | Freq: Every day | ORAL | Status: DC
  Administered 2015-11-25 – 2015-12-01 (×7): 1 via ORAL
  Filled 2015-11-19 (×12): qty 1

## 2015-11-19 MED ORDER — BUDESONIDE-FORMOTEROL FUMARATE 80-4.5 MCG/ACT IN AERO
2.0000 | INHALATION_SPRAY | Freq: Two times a day (BID) | RESPIRATORY_TRACT | Status: DC
  Administered 2015-11-20: 2 via RESPIRATORY_TRACT
  Filled 2015-11-19: qty 6.9

## 2015-11-19 MED ORDER — GABAPENTIN 300 MG PO CAPS
300.0000 mg | ORAL_CAPSULE | Freq: Three times a day (TID) | ORAL | Status: DC
  Administered 2015-11-19 – 2015-12-01 (×34): 300 mg via ORAL
  Filled 2015-11-19 (×34): qty 1

## 2015-11-19 MED ORDER — DICLOFENAC SODIUM 1 % TD GEL
2.0000 g | Freq: Four times a day (QID) | TRANSDERMAL | Status: DC | PRN
  Filled 2015-11-19: qty 100

## 2015-11-19 NOTE — Progress Notes (Signed)
Katherine Banks [id number] Admission Data: 11/19/2015 7:29 PM Attending Provider: Aldine Contes, MD  PCP:No primary care provider on file. Consults/ Treatment Team:    Katherine Banks is a 50 y.o. female patient admitted from ED awake, alert  & orientated  X 3,  Full Code, VSS - Blood pressure 155/79, pulse 110, temperature 98.4 F (36.9 C), temperature source Oral, resp. rate 18, SpO2 99 %., O2    Room air, no c/o shortness of breath, no c/o chest pain, no distress noted. Tele # 09 placed and pt is currently running: NSR   IV site WDL:  Pt has power right chest port with a transparent dsg that's clean dry and intact. NS running at 170mL/hour  Allergies:   Allergies  Allergen Reactions  . Compazine [Prochlorperazine Edisylate]   . Morphine And Related   . Phenergan [Promethazine Hcl]   . Toradol [Ketorolac Tromethamine]      Past Medical History  Diagnosis Date  . Lupus ([place])   . DVT (deep venous thrombosis) ([place])   . Hypertension   . Asthma   . Avascular necrosis ([place])    Pt orientation to unit, room and routine. Information packet given to patient/family and safety video watched.  Admission INP armband ID verified with patient/family, and in place. SR up x 2, fall risk assessment complete with Patient and family verbalizing understanding of risks associated with falls. Pt verbalizes an understanding of how to use the call bell and to call for help before getting out of bed.  Skin, clean-dry- intact without evidence of bruising, or skin tears.   No evidence of skin break down noted on exam. Skin exam done with Cassandra RN. Pt resting in bed. Bed low and locked. Call bell within reach. No further needs noted at this time.   Will cont to monitor and assist as needed.  Dorita Fray, RN 11/19/2015 7:29 PM

## 2015-11-19 NOTE — ED Notes (Signed)
Pt sts N/V/D x 4 days with CP and lupus flare with joint pain; pt tearful

## 2015-11-19 NOTE — ED Provider Notes (Signed)
CSN: [id number]     Arrival date & time 11/19/15  0844 History   First MD Initiated Contact with Patient 11/19/15 [phone]     Chief Complaint  Patient presents with  . Chest Pain  . Emesis  . Lupus     (Consider location/radiation/quality/duration/timing/severity/associated sxs/prior Treatment) The history is provided by the patient and medical records. No language interpreter was used.     Katherine Banks is a 50 y.o. female  with a hx of Lupus, DVT, HTN, asthma, AVN presents to the Emergency Department complaining of gradual, persistent, progressively worsening lupus flare onset 14 days ago.   Pt reports she is taking Plaquenil for her lupus.  Pt reports all over joint aches and pain.  Pt reports  She ran out of pain medications on 11/02/15.   She has been taking tramadol without relief.  Pt reports she also takes steroids regularly.    Pt reports she caught "a 24 hr GI bug" from her grandchildren over [place].  She reports intermittent episodes of vomiting and diarrhea since that time.  She reports her last episode of emesis was yesterday morning.  Pt reports no hematemesis. She has taken imodium with moderate relief.  She reports no melena but occasional  BRB on the toilet paper after numerous episodes of watery diarrhea.    Pt reports central chest pressure that began 4 days ago.  Pt reports she often has chest pain in association with her Lupus flares and this pain is the same as previous.  Laying back makes the symptoms worse and sitting up improves them; leaning forward does not continue to improve the symptoms.  Pt reports she has had some SOB, but these were limited to her asthma flare while her sister was smoking in her home on 11/01/15 and 11/10/15.  Pt denies SOB with her CP.    Pt reports she moved from [place] on Oct 14, 2015.  Pt was followed by [facility].  Pt reports she has not narcotic pain control as the  pharmacy here will not fill her paper Rx.  Pt has an Rx with her from [facility] for oxycodone 15mg  tabs.  Pt reports she does not yet have a PCP here.    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  Pt reports her last joint infection was last year.  Pt reports a recurrence of the pain and inability to walk since Nov 03, 2015.   Pt reports no fevers at home.  Movement makes the symptoms worse and nothing makes them better.    Pt reports dysuria, hematuria, dark urine and foul odor.  Pt also reports decreased urination, but some urgency.     Past Medical History  Diagnosis Date  . Lupus ([place])   . DVT (deep venous thrombosis) ([place])   . Hypertension   . Asthma   . Avascular necrosis [facility])    Past Surgical History  Procedure Laterality Date  . Abdominal hysterectomy     History reviewed. No pertinent family history. Social History  Substance Use Topics  . Smoking status: Never Smoker   . Smokeless tobacco: None  . Alcohol Use: No   OB History    No data available     Review of Systems  Constitutional: Positive for fatigue. Negative for fever, diaphoresis, appetite change and unexpected weight change.  HENT: Negative for mouth sores.   Eyes: Negative for visual disturbance.  Respiratory: Positive for chest tightness and shortness of breath (episodic and not current). Negative for cough and wheezing.   Cardiovascular: Positive for chest pain. Negative for leg swelling.  Gastrointestinal: Positive for nausea, vomiting, abdominal pain and diarrhea. Negative for constipation.  Endocrine: Negative for polydipsia, polyphagia and polyuria.  Genitourinary: Positive for dysuria, hematuria and decreased urine volume. Negative for urgency and frequency.  Musculoskeletal: Positive for arthralgias (right hip). Negative for back pain and neck stiffness.  Skin: Negative for rash.  Allergic/Immunologic: Negative for immunocompromised state.   Neurological: Negative for syncope, light-headedness and headaches.  Hematological: Does not bruise/bleed easily.  Psychiatric/Behavioral: Negative for sleep disturbance. The patient is not nervous/anxious.       Allergies  Compazine; Morphine and related; Phenergan; and Toradol  Home Medications   Prior to Admission medications   Medication Sig Start Date End Date Taking? Authorizing Provider  albuterol (PROVENTIL) (2.5 MG/3ML) 0.083% nebulizer solution Take 2.5 mg by nebulization every 8 (eight) hours.   Yes Historical Provider, MD  amLODipine (NORVASC) 5 MG tablet Take 5 mg by mouth daily. 09/24/15  Yes Historical Provider, MD  cloNIDine (CATAPRES) 0.2 MG tablet Take 0.2 mg by mouth 2 (two) times daily. 09/15/15  Yes Historical Provider, MD  clotrimazole-betamethasone (LOTRISONE) cream Apply 1 application topically 2 (two) times daily.   Yes Historical Provider, MD  colesevelam (WELCHOL) 625 MG tablet Take 1,250 mg by mouth daily as needed.   Yes Historical Provider, MD  cyanocobalamin 500 MCG tablet Take 500 mcg by mouth daily.   Yes Historical Provider, MD  cyclobenzaprine (FLEXERIL) 10 MG tablet Take 10 mg by mouth every 8 (eight) hours as needed for muscle spasms.  10/14/15  Yes Historical Provider, MD  diclofenac sodium (VOLTAREN) 1 % GEL Apply 1 application topically daily as needed. 11/15/15  Yes Historical Provider, MD  dicyclomine (BENTYL) 20 MG tablet Take 20 mg by mouth 2 (two) times daily as needed. 09/15/15  Yes Historical Provider, MD  diphenhydrAMINE (SOMINEX) 25 MG tablet Take 25 mg by mouth at bedtime as needed for allergies or sleep.   Yes Historical Provider, MD  docusate sodium (COLACE) 100 MG capsule Take 100 mg by mouth daily.   Yes Historical Provider, MD  fluticasone (FLONASE) 50 MCG/ACT nasal spray Place 1 spray into both nostrils daily.   Yes Historical Provider, MD  gabapentin (NEURONTIN) 300 MG capsule Take 300 mg by mouth 3 (three) times daily. 10/09/15  Yes  Historical Provider, MD  hydroxychloroquine (PLAQUENIL) 200 MG tablet Take 200 mg by mouth 2 (two) times daily.  09/14/15  Yes Historical Provider, MD  hyoscyamine (LEVSIN SL) 0.125 MG SL tablet Place 0.125 mg under the tongue every 6 (six) hours as needed.   Yes Historical Provider, MD  ibuprofen (ADVIL,MOTRIN) 800 MG tablet Take 800 mg by mouth every 8 (eight) hours as needed. 10/30/15  Yes Historical Provider, MD  lisinopril (PRINIVIL,ZESTRIL) 40 MG tablet Take 40 mg by mouth daily. 11/15/15  Yes Historical Provider, MD  montelukast (SINGULAIR) 10 MG tablet Take 10 mg by mouth daily. 09/13/15  Yes Historical Provider, MD  multivitamin-iron-minerals-folic acid (THERAPEUTIC-M) TABS tablet Take 1 tablet by mouth daily.   Yes Historical Provider, MD  omeprazole (PRILOSEC) 20 MG capsule Take 20 mg by mouth 2 (two) times daily before a meal.   Yes Historical Provider, MD  ondansetron (ZOFRAN-ODT) 4 MG disintegrating tablet Take 4 mg by mouth every 6 (six) hours as needed. 11/16/15  Yes Historical Provider, MD  oxyCODONE (ROXICODONE)  15 MG immediate release tablet Take 15 mg by mouth every 6 (six) hours as needed. 10/24/15  Yes Historical Provider, MD  phentermine (ADIPEX-P) 37.5 MG tablet Take 37.5 mg by mouth daily before breakfast.   Yes Historical Provider, MD  predniSONE (DELTASONE) 20 MG tablet Take 75 mg by mouth daily with breakfast.   Yes Historical Provider, MD  SYMBICORT 80-4.5 MCG/ACT inhaler Take 2 puffs by mouth 2 (two) times daily. 09/19/15  Yes Historical Provider, MD  traMADol (ULTRAM) 50 MG tablet Take 50 mg by mouth every 4 (four) hours as needed for moderate pain.   Yes Historical Provider, MD  triamcinolone cream (KENALOG) 0.1 % Apply 1 application topically 2 (two) times daily.   Yes Historical Provider, MD  zolpidem (AMBIEN) 10 MG tablet Take 10 mg by mouth at bedtime. 09/29/15  Yes Historical Provider, MD   BP 120/75 mmHg  Pulse 100  Temp(Src) 97.6 F (36.4 C) (Oral)  Resp 10   SpO2 100% Physical Exam  Constitutional: She appears well-developed and well-nourished. No distress.  Awake, alert, nontoxic appearance  HENT:  Head: Normocephalic and atraumatic.  Right Ear: External ear normal.  Left Ear: External ear normal.  Nose: Nose normal. No mucosal edema or rhinorrhea.  Mouth/Throat: Oropharynx is clear and moist. Mucous membranes are dry. No oropharyngeal exudate.  Mucous membranes are very dry  Eyes: Conjunctivae are normal. No scleral icterus.  Neck: Normal range of motion. Neck supple.  Cardiovascular: Normal rate, regular rhythm, normal heart sounds and intact distal pulses.   No murmur heard. Pulmonary/Chest: Effort normal and breath sounds normal. No respiratory distress. She has no wheezes.  Equal chest expansion  Abdominal: Soft. Bowel sounds are normal. She exhibits no mass. There is no tenderness. There is no rebound and no guarding.  Soft; tender in the RLQ  Musculoskeletal: Normal range of motion. She exhibits no edema.  Right hip: Multiple well-healed surgical scars with tenderness to palpation throughout the entire joint, no erythema or increased warmth; significantly decreased range of motion due to pain and lack of effort; no peripheral edema  Neurological: She is alert.  Speech is clear and goal oriented Moves extremities without ataxia  Skin: Skin is warm and dry. She is not diaphoretic.  Psychiatric: She has a normal mood and affect.  Nursing note and vitals reviewed.   ED Course  Procedures (including critical care time) Labs Review Labs Reviewed  BASIC METABOLIC PANEL - Abnormal; Notable for the following:    CO2 21 (*)    BUN 24 (*)    Creatinine, Ser 1.28 (*)    GFR calc non Af Amer 48 (*)    GFR calc Af Amer 55 (*)    Anion gap 17 (*)    All other components within normal limits  CBC - Abnormal; Notable for the following:    WBC 18.0 (*)    MCV 77.8 (*)    MCH 25.5 (*)    RDW 17.3 (*)    All other components within  normal limits  URINALYSIS, ROUTINE W REFLEX MICROSCOPIC (NOT AT [facility]) - Abnormal; Notable for the following:    Color, Urine AMBER (*)    Specific Gravity, Urine 1.035 (*)    Bilirubin Urine MODERATE (*)    Ketones, ur 40 (*)    Protein, ur 30 (*)    All other components within normal limits  SEDIMENTATION RATE - Abnormal; Notable for the following:    Sed Rate 55 (*)    All  other components within normal limits  URINE MICROSCOPIC-ADD ON - Abnormal; Notable for the following:    Squamous Epithelial / LPF 6-30 (*)    Bacteria, UA FEW (*)    Casts HYALINE CASTS (*)    All other components within normal limits  CULTURE, BLOOD (ROUTINE X 2)  CULTURE, BLOOD (ROUTINE X 2)  CBC WITH DIFFERENTIAL/PLATELET  I-STAT TROPOININ, ED  I-STAT CG4 LACTIC ACID, ED    Imaging Review Dg Chest 1 View  11/19/2015  CLINICAL DATA:  Port-A-Cath placement.  Systemic lupus erythematosus EXAM: CHEST 1 VIEW COMPARISON:  Study obtained earlier in the day FINDINGS: Port-A-Cath tip at cavoatrial junction. No pneumothorax. Lungs clear. Heart size and pulmonary vascularity normal. No adenopathy. No bone lesions. IMPRESSION: Port-A-Cath tip at cavoatrial junction. No pneumothorax. Lungs clear. Electronically Signed   By: Lowella Grip III M.D.   On: 11/19/2015 11:31   Dg Chest 2 View  11/19/2015  CLINICAL DATA:  Shortness of breath and chest pain for 2 days EXAM: CHEST  2 VIEW COMPARISON:  May 18, 2011 FINDINGS: Port-A-Cath tip is at the cavoatrial junction. No pneumothorax. There is mild scarring in the left upper lobe. The lungs elsewhere clear. Heart size and pulmonary vascularity are normal. No adenopathy. No bone lesions. IMPRESSION: Port-A-Cath tip at cavoatrial junction. No pneumothorax. No edema or consolidation. Mild scarring left upper lobe, stable. Electronically Signed   By: Lowella Grip III M.D.   On: 11/19/2015 09:21   Ct Hip Right Wo Contrast  11/19/2015  CLINICAL DATA:  Chronic right hip pain.  Patient reports 23 surgery since 2012. 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 EXAM: CT OF THE RIGHT HIP WITHOUT CONTRAST TECHNIQUE: Multidetector CT imaging of the right hip was performed according to the standard protocol. Multiplanar CT image reconstructions were also generated. COMPARISON:  Abdominal pelvic CT 07/31/2011. Right hip CT 05/12/2011. Right hip radiographs 11/19/2015. FINDINGS: As demonstrated on recent radiographs, the patient has undergone removal of the right total hip arthroplasty and resection of the right femoral head and neck compared with the prior CTs. There is extensive posttraumatic deformity of the proximal right femoral diaphysis consistent with a healed fracture. The right acetabulum is also diffusely eroded and sclerotic. No active bone destruction identified. The right femur is laterally displaced relative to the pelvis. The right sacroiliac joint and symphysis pubis appear unremarkable. There is no evidence of acute fracture or dislocation. Postsurgical changes are present within the subcutaneous fat anterior and lateral to the right hip. There is ill-defined mixed intermediate and low-density between the right femur and acetabulum with multiple small bone fragments. No inflammatory changes identified within the surrounding muscles or fat. IVC filter noted. IMPRESSION: 1. Posttraumatic deformity of the proximal right femur status post removal of right total hip arthroplasty and resection of the right femoral head and neck. The acetabulum is eroded. 2. No CT evidence of active osteomyelitis. 3. Nonspecific mixed soft tissue and low-density between the acetabulum and femur, likely synovitis. Ongoing soft tissue infection in this area cannot be completely excluded. Electronically Signed   By: Richardean Sale M.D.   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 Deformity of the proximal RIGHT  femur post fracture. Osseous demineralization. Pelvis appears intact. No acute fracture or bone destruction. IVC filter noted. 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 No definite acute bony abnormalities. Electronically Signed   By: Lavonia Dana M.D.   On: 11/19/2015 11:46   I have personally reviewed and evaluated these images and lab results as part of my medical decision-making.   MDM   Final diagnoses:  Dehydration  Lupus (systemic lupus erythematosus) ([facility])  Arthralgia of right hip  Elevated sed rate  Elevated serum creatinine   [name]  Presents with multiple complaints.  Primarily concern for right hip septic joint. Patient has chronic steroid usage and is somewhat immunocompromised. She complains of a lupus flare which has been treated with pain medication.  Lactic acid is normal, urinalysis evidence of urinary tract infection, no electrolyte abnormalities however elevated creatinine at 1.28.  Evidence of dehydration.  Patient even fluids in the emergency department.  Elevated white blood cell count to 18. Noted sedimentation rate 55. CT scan of the right pending to assess for fluid collection.    3:34 PM Discussed with internal medicine teaching service who will admit.  Consult to ortho.    Discussed with orthopedics, Dr. Ninfa Linden and have updated internal medicine teaching service on his recommendations.  BP 120/75 mmHg  Pulse 100  Temp(Src) 97.6 F (36.4 C) (Oral)  Resp 10  SpO2 100%  The patient was discussed with and seen by Dr. Oleta Mouse who agrees with the treatment plan.   Jarrett Soho Enrigue Hashimi,  PA-C 11/19/15 [place] Liu, MD 11/19/15 [phone]

## 2015-11-19 NOTE — Consult Note (Signed)
Reason for Consult:  R/O septic right hip Referring Physician:   EDP  Jinx Gilden is an 50 y.o. female.  HPI:   50 yo female from [place] who has recently relocated closer to here.  Has been feeling sick recently with a possible GI source with N/V.  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  She has chronic pain associated with this and says her pain has slowly worsened.  The ED asked for a consultation from Ortho to R/O a septic right hip joint - although I think they just want ortho to rule out residual infection or abscess since she does not have a right hip joint at this point.  Past Medical History  Diagnosis Date  . Lupus ([place])   . DVT (deep venous thrombosis) ([place])   . Hypertension   . Asthma   . Avascular necrosis [facility])     Past Surgical History  Procedure Laterality Date  . Abdominal hysterectomy      History reviewed. No pertinent family history.  Social History:  reports that she has never smoked. She does not have any smokeless tobacco history on file. She reports that she does not drink alcohol or use illicit drugs.  Allergies:  Allergies  Allergen Reactions  . Compazine [Prochlorperazine Edisylate]   . Morphine And Related   . Phenergan [Promethazine Hcl]   . Toradol [Ketorolac Tromethamine]     Medications: I have reviewed the patient's current medications.  Results for orders placed or performed during the hospital encounter of 11/19/15 (from the past 48 hour(s))  Basic metabolic panel     Status: Abnormal   Collection Time: 11/19/15  9:04 AM  Result Value Ref Range   Sodium 140 135 - 145 mmol/L   Potassium 3.5 3.5 - 5.1 mmol/L   Chloride 102 101 - 111 mmol/L   CO2 21 (L) 22 - 32 mmol/L   Glucose, Bld 96 65 - 99 mg/dL   BUN 24 (H) 6 - 20 mg/dL   Creatinine, Ser 1.28 (H) 0.44 - 1.00 mg/dL   Calcium 9.6 8.9 - 10.3  mg/dL   GFR calc non Af Amer 48 (L) >60 mL/min   GFR calc Af Amer 55 (L) >60 mL/min    Comment: (NOTE) The eGFR has been calculated using the CKD EPI equation. This calculation has not been validated in all clinical situations. eGFR's persistently <60 mL/min signify possible Chronic Kidney Disease.    Anion gap 17 (H) 5 - 15  CBC     Status: Abnormal   Collection Time: 11/19/15  9:04 AM  Result Value Ref Range   WBC 18.0 (H) 4.0 - 10.5 K/uL   RBC 5.10 3.87 - 5.11 MIL/uL   Hemoglobin 13.0 12.0 - 15.0 g/dL   HCT 39.7 36.0 - 46.0 %   MCV 77.8 (L) 78.0 - 100.0 fL   MCH 25.5 (L) 26.0 - 34.0 pg   MCHC 32.7 30.0 - 36.0 g/dL   RDW 17.3 (H) 11.5 - 15.5 %   Platelets 400 150 - 400 K/uL  I-stat troponin, ED (not at [facility])     Status: None   Collection Time: 11/19/15  9:07 AM  Result Value Ref Range   Troponin i, poc 0.01 0.00 - 0.08 ng/mL   Comment 3            Comment:  Due to the release kinetics of cTnI, a negative result within the first hours of the onset of symptoms does not rule out myocardial infarction with certainty. If myocardial infarction is still suspected, repeat the test at appropriate intervals.   Urinalysis, Routine w reflex microscopic (not at [facility])     Status: Abnormal   Collection Time: 11/19/15 10:07 AM  Result Value Ref Range   Color, Urine AMBER (A) YELLOW    Comment: BIOCHEMICALS MAY BE AFFECTED BY COLOR   APPearance CLEAR CLEAR   Specific Gravity, Urine 1.035 (H) 1.005 - 1.030   pH 6.0 5.0 - 8.0   Glucose, UA NEGATIVE NEGATIVE mg/dL   Hgb urine dipstick NEGATIVE NEGATIVE   Bilirubin Urine MODERATE (A) NEGATIVE   Ketones, ur 40 (A) NEGATIVE mg/dL   Protein, ur 30 (A) NEGATIVE mg/dL   Nitrite NEGATIVE NEGATIVE   Leukocytes, UA NEGATIVE NEGATIVE  Urine microscopic-add on     Status: Abnormal   Collection Time: 11/19/15 10:07 AM  Result Value Ref Range   Squamous Epithelial / LPF 6-30 (A) NONE SEEN   WBC, UA 0-5 0 - 5 WBC/hpf   RBC / HPF 0-5 0 - 5  RBC/hpf   Bacteria, UA FEW (A) NONE SEEN   Casts HYALINE CASTS (A) NEGATIVE   Urine-Other MUCOUS PRESENT   Sedimentation rate     Status: Abnormal   Collection Time: 11/19/15 10:55 AM  Result Value Ref Range   Sed Rate 55 (H) 0 - 22 mm/hr  I-Stat CG4 Lactic Acid, ED     Status: None   Collection Time: 11/19/15 11:11 AM  Result Value Ref Range   Lactic Acid, Venous 1.69 0.5 - 2.0 mmol/L    Dg Chest 1 View  11/19/2015  CLINICAL DATA:  Port-A-Cath placement.  Systemic lupus erythematosus EXAM: CHEST 1 VIEW COMPARISON:  Study obtained earlier in the day FINDINGS: Port-A-Cath tip at cavoatrial junction. No pneumothorax. Lungs clear. Heart size and pulmonary vascularity normal. No adenopathy. No bone lesions. IMPRESSION: Port-A-Cath tip at cavoatrial junction. No pneumothorax. Lungs clear. Electronically Signed   By: Lowella Grip III M.D.   On: 11/19/2015 11:31   Dg Chest 2 View  11/19/2015  CLINICAL DATA:  Shortness of breath and chest pain for 2 days EXAM: CHEST  2 VIEW COMPARISON:  May 18, 2011 FINDINGS: Port-A-Cath tip is at the cavoatrial junction. No pneumothorax. There is mild scarring in the left upper lobe. The lungs elsewhere clear. Heart size and pulmonary vascularity are normal. No adenopathy. No bone lesions. IMPRESSION: Port-A-Cath tip at cavoatrial junction. No pneumothorax. No edema or consolidation. Mild scarring left upper lobe, stable. Electronically Signed   By: Lowella Grip III M.D.   On: 11/19/2015 09:21   Ct Hip Right Wo Contrast  11/19/2015  CLINICAL DATA:  Chronic right hip pain. Patient reports 23 surgery since 2012. 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 EXAM: CT OF THE RIGHT HIP WITHOUT CONTRAST TECHNIQUE: Multidetector CT imaging of the right hip was performed according to the standard protocol. Multiplanar CT image reconstructions were also generated. COMPARISON:  Abdominal pelvic CT 07/31/2011. Right hip CT 05/12/2011.  Right hip radiographs 11/19/2015. FINDINGS: As demonstrated on recent radiographs, the patient has undergone removal of the right total hip arthroplasty and resection of the right femoral head and neck compared with the prior CTs. There is extensive posttraumatic deformity of the proximal right femoral diaphysis consistent with a healed fracture. The right acetabulum is also diffusely eroded  and sclerotic. No active bone destruction identified. The right femur is laterally displaced relative to the pelvis. The right sacroiliac joint and symphysis pubis appear unremarkable. There is no evidence of acute fracture or dislocation. Postsurgical changes are present within the subcutaneous fat anterior and lateral to the right hip. There is ill-defined mixed intermediate and low-density between the right femur and acetabulum with multiple small bone fragments. No inflammatory changes identified within the surrounding muscles or fat. IVC filter noted. IMPRESSION: 1. Posttraumatic deformity of the proximal right femur status post removal of right total hip arthroplasty and resection of the right femoral head and neck. The acetabulum is eroded. 2. No CT evidence of active osteomyelitis. 3. Nonspecific mixed soft tissue and low-density between the acetabulum and femur, likely synovitis. Ongoing soft tissue infection in this area cannot be completely excluded. Electronically Signed   By: Richardean Sale M.D.   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 Deformity of the proximal RIGHT femur post fracture. Osseous demineralization. Pelvis appears intact. No acute fracture or bone destruction. IVC filter noted. 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 No definite acute bony abnormalities. Electronically Signed   By: Lavonia Dana M.D.   On: 11/19/2015 11:46    ROS Blood pressure 155/79, pulse 110, temperature 98.4 F (36.9 C), temperature source Oral, resp. rate 18, SpO2 99 %. Physical Exam  Constitutional: She is oriented to person, place, and time. She appears well-developed and well-nourished.  HENT:  Head: Normocephalic and atraumatic.  Neck: Normal range of motion. Neck supple.  Respiratory: Effort normal.  GI: Soft.  Musculoskeletal:       Right hip: She exhibits decreased strength and tenderness.  Neurological: She is alert and oriented to person, place, and time.  Her skin is intact over her right hip.  There is no warmth or redness at all.  Her incisions are all well-healed.  There is no fluctuance.  There is no swelling.  He does have some pain to be expected, but I can examine her easily. She does not appear septic at all.  Assessment/Plan: Right hip with chronic pain post multiple surgeries including a girdelstone procedure ( removal of her prosthesis) 1) Her right hip does not appear to be acutely infected at all. The exam of her right hip/thigh and her CT scan are both underwhelming.  There is no evidence of an abscess at all.  She does exhibit chronic pain, but I do not see a need for a surgical intervention at all in regards to this right hip.  I think her inability to ambulate on this hip is due to her chronic condition.  If it is felt that there is infection present that is causing her current condition, then a CT guided aspiration would be recommended.  BLACKMAN,CHRISTOPHER Y 11/19/2015, 7:16 PM

## 2015-11-19 NOTE — H&P (Signed)
Date: 11/19/2015               Patient Name:  Katherine Banks MRN: [id number]  DOB: June 29, 1966 Age / Sex: 50 y.o., female   PCP: No primary care provider on file.         Medical Service: Internal Medicine Teaching Service         Attending Physician: Dr. Aldine Contes, MD    First Contact: Dr. [name] Pager: [phone]  Second Contact: Dr. Julious Oka  Pager: [phone]       After Hours (After 5p/  First Contact Pager: [phone]  weekends / holidays): Second Contact Pager: [phone]   Chief Complaint:  Right hip pain and lupus flare   History of Present Illness:   THis is a 50 yo woman with a very complicated past medical history, with SLE, DVTs sp IVC filter, HTN, asthma who came in with lupus flare, and hip pain. She also has history of cardiac stent in 2013, and history of uterine cancer  Patient says that this started on Christmas when she caught a GI bug from her grandkids in [place]. Then on January 2nd, she came here to [place], and continued to have n/v/d. On January 3rd, she noticed her joints were swollen and felt like she was getting a lupus flare , then on Jan 6th, she started to have a lot of pain in her right hip. Patient has had right hip replaced and has had 23 surgeries  And frequently gets joint infections. She is on chronic steroids for her lupus- prednisone 60 mg daily, and also plaquenil.  She says she is unable to put pressure on her hip or bear weight. Her pain is about a 6/10 and it is sharp.   Her diarrhea and vomiting has resolved since yesterday morning, and patient today drank '2 jugs of water' and she was able to keep all of that down, and she is eager to restart her diet. Before that she was having loose watery diarrhea up to 15 times per day. She says the imodium has helped.   She says her lupus was diagnosed 12 years ago, and she has frequently been on steroid tapers, however, for the past 2 years, she has been on continuous prednisone 60 mg  daily. She was diagnosed when she started noticing skin rash and arthralgias. Patient used to see [facility] in [place] before she moved here on January 3rd to live with her 87 year old mother so she has yet to find a PCP or rheumatologist.  She says that she frequently gets lupus flares, and in the past, she was treated with dilaudid and phenergan per paper charts she brought. She says that she frequently gets chest pain which is sharp, nonradiating and waxing and waning every time she gets a lupus flare. Currently she did endorse some chest pain and felt that this is the same exact pain she has whenever she has flare ups.  She denies any fevers, but says she never had any fevers as a manifestation of her illness since her 50 years old. She denies weight changes or night sweats. She denies hematuria or hematochezia. She used to have swallowing difficulties but since 8 months, it has been fine. She denies shortness of breath. She denies ocular manifestations of her lupus, and she reports normal vision with glasses, and no blurriness. She said at one point she had "polynephritis", but says her 'kidneys are fine right now'.  She also has a  port at which she receives an infusion- she is unable to provide details on that infusion for her lupus. 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 The IVC filter was placed in 2013.  She frequently gets blood transfusions and had '20 transfusions in the past year' as her hemoglobin tends to stay low.   For her lupus, since she has moved here, she has not been able to receive the infusion since November, and she has run out of the oxycodone and tramadol which she uses for flare ups at home. She has not taken her prednisone since 2 days ago.   She also had a 'cardiac stent' placed in 2013, and she is taking aspirin for that. Also  present is a history of uterine cancer 20 years ago.  Regarding her hip pain, she says she has had 23 surgeries on her hip, and it has gotten to the point when she is unable to bear any weight. She denies trauma, or drainage.   Of note, patient says that she was found to have a right breast mass back in November via a mammogram, and she was supposed to have a follow up mammogram, but she was not able to make it to the follow up appointment. At that time, she noticed discharge from her right nipple. She has not had discharge in few weeks. She also noted scaly skin changes.   In the ER, orthopedics was consulted for possible arthrocentesis.    FH: of lupus in father dx at age of 42, HTN and diabetes, breast and uterine cancer and strokes  SH: last smoked 27 years ago, no alcohol or illicit drug use. She currently lives with her mother who is 32 in [place]: Current Facility-Administered Medications  Medication Dose Route Frequency Provider Last Rate Last Dose  . 0.9 %  sodium chloride infusion   Intravenous Continuous Norman Herrlich, MD      . albuterol (PROVENTIL) (2.5 MG/3ML) 0.083% nebulizer solution 2.5 mg  2.5 mg Nebulization 3 times per day Norman Herrlich, MD      . budesonide-formoterol [facility]) 80-4.5 MCG/ACT inhaler 2 puff  2 puff Inhalation BID Norman Herrlich, MD      . cloNIDine (CATAPRES) tablet 0.2 mg  0.2 mg Oral BID Norman Herrlich, MD      . cyclobenzaprine (FLEXERIL) tablet 10 mg  10 mg Oral Q8H PRN Norman Herrlich, MD      . diclofenac sodium (VOLTAREN) 1 % transdermal gel 2 g  2 g Topical QID PRN Norman Herrlich, MD      . dicyclomine (BENTYL) tablet 20 mg  20 mg Oral BID PRN Norman Herrlich, MD      . diphenhydrAMINE [facility]) tablet 25 mg  25 mg Oral QHS PRN Norman Herrlich, MD      . gabapentin (NEURONTIN) capsule 300 mg  300 mg Oral TID Norman Herrlich, MD      . heparin injection 5,000 Units  5,000 Units Subcutaneous 3 times per day Norman Herrlich, MD      .  hydroxychloroquine (PLAQUENIL) tablet 200 mg  200 mg Oral BID Norman Herrlich, MD      . montelukast (SINGULAIR) tablet 10 mg  10 mg Oral Daily Norman Herrlich, MD      .  multivitamin-iron-minerals-folic acid (THERAPEUTIC-M) per tablet 1 tablet  1 tablet Oral Daily Norman Herrlich, MD      . ondansetron (ZOFRAN-ODT) disintegrating tablet 4 mg  4 mg Oral Q8H PRN Norman Herrlich, MD      . oxyCODONE (Oxy IR/ROXICODONE) immediate release tablet 15 mg  15 mg Oral Q6H PRN Norman Herrlich, MD      . predniSONE (DELTASONE) tablet 60 mg  60 mg Oral Q breakfast Norman Herrlich, MD      . traMADol Veatrice Bourbon) tablet 50 mg  50 mg Oral Q4H PRN Norman Herrlich, MD      . zolpidem Lorrin Mais) tablet 10 mg  10 mg Oral QHS Norman Herrlich, MD       Current Outpatient Prescriptions  Medication Sig Dispense Refill  . albuterol (PROVENTIL) (2.5 MG/3ML) 0.083% nebulizer solution Take 2.5 mg by nebulization every 8 (eight) hours.    Marland Kitchen amLODipine (NORVASC) 5 MG tablet Take 5 mg by mouth daily.    . cloNIDine (CATAPRES) 0.2 MG tablet Take 0.2 mg by mouth 2 (two) times daily.    . clotrimazole-betamethasone (LOTRISONE) cream Apply 1 application topically 2 (two) times daily.    . colesevelam (WELCHOL) 625 MG tablet Take 1,250 mg by mouth daily as needed.    . cyanocobalamin 500 MCG tablet Take 500 mcg by mouth daily.    . cyclobenzaprine (FLEXERIL) 10 MG tablet Take 10 mg by mouth every 8 (eight) hours as needed for muscle spasms.     . diclofenac sodium (VOLTAREN) 1 % GEL Apply 1 application topically daily as needed.    . dicyclomine (BENTYL) 20 MG tablet Take 20 mg by mouth 2 (two) times daily as needed.    . diphenhydrAMINE (SOMINEX) 25 MG tablet Take 25 mg by mouth at bedtime as needed for allergies or sleep.    Marland Kitchen docusate sodium (COLACE) 100 MG capsule Take 100 mg by mouth daily.    . fluticasone (FLONASE) 50 MCG/ACT nasal spray Place 1 spray into both nostrils daily.    Marland Kitchen gabapentin (NEURONTIN) 300 MG capsule Take 300 mg by  mouth 3 (three) times daily.    . hydroxychloroquine (PLAQUENIL) 200 MG tablet Take 200 mg by mouth 2 (two) times daily.     . hyoscyamine (LEVSIN SL) 0.125 MG SL tablet Place 0.125 mg under the tongue every 6 (six) hours as needed.    Marland Kitchen ibuprofen (ADVIL,MOTRIN) 800 MG tablet Take 800 mg by mouth every 8 (eight) hours as needed.  1  . lisinopril (PRINIVIL,ZESTRIL) 40 MG tablet Take 40 mg by mouth daily.    . montelukast (SINGULAIR) 10 MG tablet Take 10 mg by mouth daily.    . multivitamin-iron-minerals-folic acid (THERAPEUTIC-M) TABS tablet Take 1 tablet by mouth daily.    Marland Kitchen omeprazole (PRILOSEC) 20 MG capsule Take 20 mg by mouth 2 (two) times daily before a meal.    . ondansetron (ZOFRAN-ODT) 4 MG disintegrating tablet Take 4 mg by mouth every 6 (six) hours as needed.    Marland Kitchen oxyCODONE (ROXICODONE) 15 MG immediate release tablet Take 15 mg by mouth every 6 (six) hours as needed.    . phentermine (ADIPEX-P) 37.5 MG tablet Take 37.5 mg by mouth daily before breakfast.    . predniSONE (DELTASONE) 20 MG tablet Take 75 mg by mouth daily with breakfast.    . SYMBICORT 80-4.5 MCG/ACT inhaler Take 2 puffs by mouth 2 (two) times daily.    . traMADol (ULTRAM) 50 MG  tablet Take 50 mg by mouth every 4 (four) hours as needed for moderate pain.    Marland Kitchen triamcinolone cream (KENALOG) 0.1 % Apply 1 application topically 2 (two) times daily.    Marland Kitchen zolpidem (AMBIEN) 10 MG tablet Take 10 mg by mouth at bedtime.      Allergies: Allergies as of 11/19/2015 - Review Complete 11/19/2015  Allergen Reaction Noted  . Compazine [prochlorperazine edisylate]  11/19/2015  . Morphine and related  11/19/2015  . Phenergan [promethazine hcl]  11/19/2015  . Toradol [ketorolac tromethamine]  11/19/2015   Past Medical History  Diagnosis Date  . Lupus ([place])   . DVT (deep venous thrombosis) ([place])   . Hypertension   . Asthma   . Avascular necrosis [facility])    Past Surgical History  Procedure Laterality Date  . Abdominal  hysterectomy     History reviewed. No pertinent family history. Social History   Social History  . Marital Status: Single    Spouse Name: N/A  . Number of Children: N/A  . Years of Education: N/A   Occupational History  . Not on file.   Social History Main Topics  . Smoking status: Never Smoker   . Smokeless tobacco: Not on file  . Alcohol Use: No  . Drug Use: No  . Sexual Activity: Not on file   Other Topics Concern  . Not on file   Social History Narrative  . No narrative on file    Review of Systems: Pertinent items noted in HPI and remainder of comprehensive ROS otherwise negative.  Physical Exam: Blood pressure 148/70, pulse 101, temperature 97.6 F (36.4 C), temperature source Oral, resp. rate 20, SpO2 95 %.  General: Vital signs reviewed. Patient lying in bed Cardiovascular: tachycardic,  Reg rhythm, no murmur appreciated , chest wall nontender to palpation RIght port for her infusions, looks c/d/i Pulmonary/Chest: Clear to auscultation bilaterally, no wheezes Breast: a 2 cm mobile mass palpated in the right breast. Both breasts without discharge or skin changes.  Abdominal: Soft, obese, tender to palpation in RLQ and epigastric area Hip: Right hip has post surgical incisions, clean dry intact, no drainage or erythema, limited ROm of right hip  Extremities: pulses intact, right calf tender to palpation, both calf are grossly equal in size. Right knee is tender  Both hands joints are tender to palpation, no synovitis  Skin: no malar rash, or rash anywhere   Lab results: Results for orders placed or performed during the hospital encounter of 11/19/15 (from the past 24 hour(s))  Basic metabolic panel     Status: Abnormal   Collection Time: 11/19/15  9:04 AM  Result Value Ref Range   Sodium 140 135 - 145 mmol/L   Potassium 3.5 3.5 - 5.1 mmol/L   Chloride 102 101 - 111 mmol/L   CO2 21 (L) 22 - 32 mmol/L   Glucose, Bld 96 65 - 99 mg/dL   BUN 24 (H) 6 - 20  mg/dL   Creatinine, Ser 1.28 (H) 0.44 - 1.00 mg/dL   Calcium 9.6 8.9 - 10.3 mg/dL   GFR calc non Af Amer 48 (L) >60 mL/min   GFR calc Af Amer 55 (L) >60 mL/min   Anion gap 17 (H) 5 - 15  CBC     Status: Abnormal   Collection Time: 11/19/15  9:04 AM  Result Value Ref Range   WBC 18.0 (H) 4.0 - 10.5 K/uL   RBC 5.10 3.87 - 5.11 MIL/uL   Hemoglobin 13.0 12.0 -  15.0 g/dL   HCT 39.7 36.0 - 46.0 %   MCV 77.8 (L) 78.0 - 100.0 fL   MCH 25.5 (L) 26.0 - 34.0 pg   MCHC 32.7 30.0 - 36.0 g/dL   RDW 17.3 (H) 11.5 - 15.5 %   Platelets 400 150 - 400 K/uL  I-stat troponin, ED (not at [facility], [facility])     Status: None   Collection Time: 11/19/15  9:07 AM  Result Value Ref Range   Troponin i, poc 0.01 0.00 - 0.08 ng/mL   Comment 3          Urinalysis, Routine w reflex microscopic (not at [facility])     Status: Abnormal   Collection Time: 11/19/15 10:07 AM  Result Value Ref Range   Color, Urine AMBER (A) YELLOW   APPearance CLEAR CLEAR   Specific Gravity, Urine 1.035 (H) 1.005 - 1.030   pH 6.0 5.0 - 8.0   Glucose, UA NEGATIVE NEGATIVE mg/dL   Hgb urine dipstick NEGATIVE NEGATIVE   Bilirubin Urine MODERATE (A) NEGATIVE   Ketones, ur 40 (A) NEGATIVE mg/dL   Protein, ur 30 (A) NEGATIVE mg/dL   Nitrite NEGATIVE NEGATIVE   Leukocytes, UA NEGATIVE NEGATIVE  Urine microscopic-add on     Status: Abnormal   Collection Time: 11/19/15 10:07 AM  Result Value Ref Range   Squamous Epithelial / LPF 6-30 (A) NONE SEEN   WBC, UA 0-5 0 - 5 WBC/hpf   RBC / HPF 0-5 0 - 5 RBC/hpf   Bacteria, UA FEW (A) NONE SEEN   Casts HYALINE CASTS (A) NEGATIVE   Urine-Other MUCOUS PRESENT   Sedimentation rate     Status: Abnormal   Collection Time: 11/19/15 10:55 AM  Result Value Ref Range   Sed Rate 55 (H) 0 - 22 mm/hr  I-Stat CG4 Lactic Acid, ED     Status: None   Collection Time: 11/19/15 11:11 AM  Result Value Ref Range   Lactic Acid, Venous 1.69 0.5 - 2.0 mmol/L     Imaging results:  Dg Chest 1 View  11/19/2015   CLINICAL DATA:  Port-A-Cath placement.  Systemic lupus erythematosus EXAM: CHEST 1 VIEW COMPARISON:  Study obtained earlier in the day FINDINGS: Port-A-Cath tip at cavoatrial junction. No pneumothorax. Lungs clear. Heart size and pulmonary vascularity normal. No adenopathy. No bone lesions. IMPRESSION: Port-A-Cath tip at cavoatrial junction. No pneumothorax. Lungs clear. Electronically Signed   By: Lowella Grip III M.D.   On: 11/19/2015 11:31   Dg Chest 2 View  11/19/2015  CLINICAL DATA:  Shortness of breath and chest pain for 2 days EXAM: CHEST  2 VIEW COMPARISON:  May 18, 2011 FINDINGS: Port-A-Cath tip is at the cavoatrial junction. No pneumothorax. There is mild scarring in the left upper lobe. The lungs elsewhere clear. Heart size and pulmonary vascularity are normal. No adenopathy. No bone lesions. IMPRESSION: Port-A-Cath tip at cavoatrial junction. No pneumothorax. No edema or consolidation. Mild scarring left upper lobe, stable. Electronically Signed   By: Lowella Grip III M.D.   On: 11/19/2015 09:21   Ct Hip Right Wo Contrast  11/19/2015  CLINICAL DATA:  Chronic right hip pain. Patient reports 23 surgery since 2012. 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 EXAM: CT OF THE RIGHT HIP WITHOUT CONTRAST TECHNIQUE: Multidetector CT imaging of the right hip was performed according to the standard protocol. Multiplanar CT image reconstructions were also generated. COMPARISON:  Abdominal pelvic CT 07/31/2011. Right hip CT 05/12/2011. Right hip radiographs  11/19/2015. FINDINGS: As demonstrated on recent radiographs, the patient has undergone removal of the right total hip arthroplasty and resection of the right femoral head and neck compared with the prior CTs. There is extensive posttraumatic deformity of the proximal right femoral diaphysis consistent with a healed fracture. The right acetabulum is also diffusely eroded and sclerotic. No active bone destruction  identified. The right femur is laterally displaced relative to the pelvis. The right sacroiliac joint and symphysis pubis appear unremarkable. There is no evidence of acute fracture or dislocation. Postsurgical changes are present within the subcutaneous fat anterior and lateral to the right hip. There is ill-defined mixed intermediate and low-density between the right femur and acetabulum with multiple small bone fragments. No inflammatory changes identified within the surrounding muscles or fat. IVC filter noted. IMPRESSION: 1. Posttraumatic deformity of the proximal right femur status post removal of right total hip arthroplasty and resection of the right femoral head and neck. The acetabulum is eroded. 2. No CT evidence of active osteomyelitis. 3. Nonspecific mixed soft tissue and low-density between the acetabulum and femur, likely synovitis. Ongoing soft tissue infection in this area cannot be completely excluded. Electronically Signed   By: Carey Bullocks M.D.   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 Deformity of the proximal RIGHT femur post fracture. Osseous demineralization. Pelvis appears intact. No acute fracture or bone destruction. IVC filter noted. 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 No definite acute bony abnormalities. Electronically Signed   By: Ulyses Southward M.D.   On: 11/19/2015 11:46    Other results:   Assessment & Plan by Problem: Active Problems:   Lupus (HCC)   Lupus flare: Patient with lupus diagnosed since 12 years, with a 14 day history of  diffuse joint pain, and chest wall pain that happens when she has flare ups and pain notable in her right hip . Patient is chronically immuno suppressed with 60 mg prednisone daily for past 2 years (and more tapers in the past), and she also gets plaquenil and some weekly infusion which she has not been getting. The flare up most likely due to her not receiving her proper meds, and also because she was not able to refill her oxycodone and tramadol which she usually uses to treat her pain.  Labs are notable for ESR of 55, and WC of 18.  -pain control with oxycodone 15 mg q6 hours PRN, and tramadol 50 mg q4 hours PRN. Received 1 mg of dilaudid in ER. -prednisone 60 mg continued -plaquenil 200 mg bid  - voltaren gel -gabapentin  -repeat CBC and bmet   Right hip joint pain int he setting of multiple previous joint infections and surgeries (23 in total) s/p removal of right hip arthroplasty : As her pain has worsened and she is chronically immunosuppressed, there was a concern for septic joint , and avascular necrosis. So, orthopaedics was consulted   -consulted orthopaedics- who said that they do not recommend doing arthrocentesis as the site does not look inflamed and no drainage coming out. She basically does not have a right hip as all the hardware  has come out and she is status post removal of right hip total arthroplasty. CT confirmed this and said there was no osteomyelitis.  As on going soft tissue infection can not be excluded, they recommended considering an MRI.   -pain control with oxycodone and tramadol -PT OT eval -flexeril    AKI: patient with several days of n/v/d, resolved since yesterday morning. Pt able to have po intake and was eager to restart appetite. Her creatinine was elevated to 1.28 and baseline seems around 0.7. Could be due to dehydration or a manifestation of lupus   -IV hydration 100 ml/hr  -repeat bmet  N/v/d: resolved -trend BMET -zofran PRN, and bentyl  Chest  pain:  Pt with nonradiating waxing and waning chest pain that is just like her previous chest pains when she gets the flare ups. She denies pleuritic chest pain, orthopnea or PND.  I stat troponin was negative.  -trend troponins Repeat EKG in AM  Breast mass: -needs outpatient mammogram appointment that will be done once she goes to our clinic to establish as patient    HTN: Pt normotensive  -clonidine  Asthma: Continue albuterol, symbicort, and singulair   F: NS 100 cc/hr  E N: reg diet per pt preference   DVT ppx: heparin 5000 units tid Code- full   Dispo: Disposition is deferred at this time, awaiting improvement of current medical problems. Anticipated discharge in approximately 2 day(s).   The patient does not have a current PCP (No primary care provider on file.) and does need an [facility] hospital follow-up appointment after discharge.  The patient does not have transportation limitations that hinder transportation to clinic appointments.  Signed: [name], MD 11/19/2015, 5:42 PM

## 2015-11-19 NOTE — ED Notes (Signed)
admitting at bedside.  

## 2015-11-19 NOTE — ED Notes (Signed)
Pt in radiology 

## 2015-11-19 NOTE — ED Notes (Signed)
Report attempted. Charge nurse to assign bed and call back.

## 2015-11-19 NOTE — ED Notes (Signed)
Phlebotomy

## 2015-11-19 NOTE — ED Notes (Signed)
MD at bedside.Oleta Mouse

## 2015-11-20 ENCOUNTER — Observation Stay (HOSPITAL_COMMUNITY): Payer: Medicare Other

## 2015-11-20 DIAGNOSIS — M25551 Pain in right hip: Secondary | ICD-10-CM | POA: Diagnosis not present

## 2015-11-20 DIAGNOSIS — N179 Acute kidney failure, unspecified: Secondary | ICD-10-CM

## 2015-11-20 DIAGNOSIS — Z9889 Other specified postprocedural states: Secondary | ICD-10-CM | POA: Diagnosis not present

## 2015-11-20 DIAGNOSIS — M329 Systemic lupus erythematosus, unspecified: Secondary | ICD-10-CM | POA: Diagnosis not present

## 2015-11-20 LAB — CBC
HEMATOCRIT: 30.8 % — AB (ref 36.0–46.0)
HEMATOCRIT: 29.9 % — AB (ref 36.0–46.0)
HEMOGLOBIN: 9.9 g/dL — AB (ref 12.0–15.0)
HEMOGLOBIN: 9.6 g/dL — AB (ref 12.0–15.0)
MCH: 24.6 pg — AB (ref 26.0–34.0)
MCH: 24.8 pg — AB (ref 26.0–34.0)
MCHC: 32.1 g/dL (ref 30.0–36.0)
MCHC: 32.1 g/dL (ref 30.0–36.0)
MCV: 76.6 fL — AB (ref 78.0–100.0)
MCV: 77.3 fL — AB (ref 78.0–100.0)
Platelets: 351 10*3/uL (ref 150–400)
Platelets: 323 10*3/uL (ref 150–400)
RBC: 4.02 MIL/uL (ref 3.87–5.11)
RBC: 3.87 MIL/uL (ref 3.87–5.11)
RDW: 17.3 % — ABNORMAL HIGH (ref 11.5–15.5)
RDW: 17.6 % — ABNORMAL HIGH (ref 11.5–15.5)
WBC: 16.5 10*3/uL — ABNORMAL HIGH (ref 4.0–10.5)
WBC: 17.5 10*3/uL — ABNORMAL HIGH (ref 4.0–10.5)

## 2015-11-20 LAB — TROPONIN I: Troponin I: 0.17 ng/mL — ABNORMAL HIGH (ref <0.031)

## 2015-11-20 LAB — BASIC METABOLIC PANEL
Anion gap: 13 (ref 5–15)
BUN: 15 mg/dL (ref 6–20)
CHLORIDE: 102 mmol/L (ref 101–111)
CO2: 23 mmol/L (ref 22–32)
CREATININE: 0.97 mg/dL (ref 0.44–1.00)
Calcium: 8.7 mg/dL — ABNORMAL LOW (ref 8.9–10.3)
GFR calc Af Amer: >60 mL/min (ref >60)
GFR calc non Af Amer: >60 mL/min (ref >60)
GLUCOSE: 144 mg/dL — AB (ref 65–99)
POTASSIUM: 3.5 mmol/L (ref 3.5–5.1)
SODIUM: 138 mmol/L (ref 135–145)

## 2015-11-20 MED ORDER — GADOBENATE DIMEGLUMINE 529 MG/ML IV SOLN
20.0000 mL | Freq: Once | INTRAVENOUS | Status: AC
  Administered 2015-11-20: 20 mL via INTRAVENOUS

## 2015-11-20 MED ORDER — ALBUTEROL SULFATE (2.5 MG/3ML) 0.083% IN NEBU: 2.5000 mg | INHALATION_SOLUTION | Freq: Four times a day (QID) | RESPIRATORY_TRACT | Status: DC | PRN

## 2015-11-20 MED ORDER — ALBUTEROL SULFATE (2.5 MG/3ML) 0.083% IN NEBU
2.5000 mg | INHALATION_SOLUTION | Freq: Three times a day (TID) | RESPIRATORY_TRACT | Status: DC
  Administered 2015-11-20: 2.5 mg via RESPIRATORY_TRACT
  Filled 2015-11-20: qty 3

## 2015-11-20 MED ORDER — ENSURE ENLIVE PO LIQD
237.0000 mL | ORAL | Status: DC
  Administered 2015-11-25 – 2015-12-01 (×5): 237 mL via ORAL

## 2015-11-20 NOTE — Progress Notes (Signed)
Initial Nutrition Assessment  DOCUMENTATION CODES:   Morbid obesity  INTERVENTION:   -Decrease Ensure Enlive to daily (prefers strawberry flavor) -Snacks between meals TID (strawberry yogurt)  NUTRITION DIAGNOSIS:   Inadequate oral intake related to poor appetite as evidenced by per patient/family report.  GOAL:   Patient will meet greater than or equal to 90% of their needs  MONITOR:   PO intake, Supplement acceptance, Labs, Weight trends, Skin, I & O's  REASON FOR ASSESSMENT:   Malnutrition Screening Tool    ASSESSMENT:   THis is a 50 yo woman with a very complicated past medical history, with SLE, DVTs sp IVC filter, HTN, asthma who came in with lupus flare, and hip pain. She also has history of cardiac stent in 2013, and history of uterine cancer  Pt admitted with lupus flare.   Hx obtained from pt at bedside. She confirms poor appetite and weight loss, related to a GI bug that she developed around Christmas approximately 3 weeks ago. She reports that her appetite is slowly returning today, revealing she consumed approximately 50% of breakfast (meal completion records indicate PO: 75%).   Pt endorses a 20# weight loss over the past 3 weeks. Per her reports, her usual weight is between 250-260#, which she last weighed about 3 months ago. Wt verified on bedscale at 250#.   Nutrition-Focused physical exam completed (pt reluctantly agreed due to pain). Findings are no fat depletion, no muscle depletion, and no edema. She reports decreased functional status due to limited mobility and pain. Pt complains on rt hip pain; orthopedics following.   Discussed importance of good meal completion to promote healing. She is amenable to Ensure supplements and is requesting strawberry yogurt between meals- RD will order snacks through nutritional services department.   Labs reviewed.   Diet Order:  Diet regular Room service appropriate?: Yes; Fluid consistency:: Thin  Skin:   Reviewed, no issues  Last BM:  11/18/15  Height:   Ht Readings from Last 1 Encounters:  11/20/15 5\' 6"  (1.676 m)    Weight:   Wt Readings from Last 1 Encounters:  11/20/15 250 lb (113.399 kg)    Ideal Body Weight:  59.1 kg  BMI:  Body mass index is 40.37 kg/(m^2).  Estimated Nutritional Needs:   Kcal:  1700-1900  Protein:  70-85 grams  Fluid:  1.7-1.9 L  EDUCATION NEEDS:   No education needs identified at this time  Cristian Grieves A. Jimmye Norman, RD, LDN, CDE Pager: [phone] After hours Pager: [phone]

## 2015-11-20 NOTE — Progress Notes (Signed)
Subjective:  Patient seen and examined at bedside. No acute events overnight. Patient eating lunch when examined. She says her pain is somewhat better, but she has been receiving dilaudid.  She had underwent MRI this morning.  She says she never gets fevers. Denies n/v/d.   Objective: Vital signs in last 24 hours: Filed Vitals:   11/19/15 2221 11/20/15 0559 11/20/15 0930 11/20/15 1110  BP:  122/57 117/64   Pulse: 106 92    Temp:  98.5 F (36.9 C)    TempSrc:  Oral    Resp: 20 20    Height:    '5\' 6"'$  (1.676 m)  Weight:    250 lb (113.399 kg)  SpO2: 97% 96%     Weight change:   Intake/Output Summary (Last 24 hours) at 11/20/15 1513 Last data filed at 11/20/15 0900  Gross per 24 hour  Intake    240 ml  Output      0 ml  Net    240 ml   General: Vital signs reviewed. Patient lying in bed Cardiovascular: Reg rate , Reg rhythm, no murmur appreciated , chest wall nontender to palpation RIght port, looks c/d/i Pulmonary/Chest: Clear to auscultation bilaterally, no wheezes Abdominal: Soft, obese, tender to palpation in RLQ and epigastric area Hip: Right hip has post surgical incisions, clean dry intact, no drainage or erythema, limited ROm of right hip  Extremities: pulses intact    Lab Results: Results for orders placed or performed during the hospital encounter of 11/19/15 (from the past 24 hour(s))  CBC with Differential/Platelet     Status: Abnormal   Collection Time: 11/19/15  9:43 PM  Result Value Ref Range   WBC 16.3 (H) 4.0 - 10.5 K/uL   RBC 4.10 3.87 - 5.11 MIL/uL   Hemoglobin 10.2 (L) 12.0 - 15.0 g/dL   HCT 31.5 (L) 36.0 - 46.0 %   MCV 76.8 (L) 78.0 - 100.0 fL   MCH 24.9 (L) 26.0 - 34.0 pg   MCHC 32.4 30.0 - 36.0 g/dL   RDW 17.5 (H) 11.5 - 15.5 %   Platelets 346 150 - 400 K/uL   Neutrophils Relative % 81 %   Neutro Abs 13.2 (H) 1.7 - 7.7 K/uL   Lymphocytes Relative 14 %   Lymphs Abs 2.3 0.7 - 4.0 K/uL   Monocytes Relative 5 %   Monocytes Absolute  0.8 0.1 - 1.0 K/uL   Eosinophils Relative 0 %   Eosinophils Absolute 0.0 0.0 - 0.7 K/uL   Basophils Relative 0 %   Basophils Absolute 0.0 0.0 - 0.1 K/uL  Troponin I (q 6hr x 3)     Status: None   Collection Time: 11/19/15  9:43 PM  Result Value Ref Range   Troponin I <0.03 <0.031 ng/mL  CBC     Status: Abnormal   Collection Time: 11/20/15  1:20 AM  Result Value Ref Range   WBC 16.5 (H) 4.0 - 10.5 K/uL   RBC 4.02 3.87 - 5.11 MIL/uL   Hemoglobin 9.9 (L) 12.0 - 15.0 g/dL   HCT 30.8 (L) 36.0 - 46.0 %   MCV 76.6 (L) 78.0 - 100.0 fL   MCH 24.6 (L) 26.0 - 34.0 pg   MCHC 32.1 30.0 - 36.0 g/dL   RDW 17.3 (H) 11.5 - 15.5 %   Platelets 351 150 - 400 K/uL  Basic metabolic panel     Status: Abnormal   Collection Time: 11/20/15  1:20 AM  Result Value Ref Range  Sodium 138 135 - 145 mmol/L   Potassium 3.5 3.5 - 5.1 mmol/L   Chloride 102 101 - 111 mmol/L   CO2 23 22 - 32 mmol/L   Glucose, Bld 144 (H) 65 - 99 mg/dL   BUN 15 6 - 20 mg/dL   Creatinine, Ser 0.97 0.44 - 1.00 mg/dL   Calcium 8.7 (L) 8.9 - 10.3 mg/dL   GFR calc non Af Amer >60 >60 mL/min   GFR calc Af Amer >60 >60 mL/min   Anion gap 13 5 - 15  Troponin I (q 6hr x 3)     Status: None   Collection Time: 11/20/15  1:20 AM  Result Value Ref Range   Troponin I <0.03 <0.031 ng/mL  CBC     Status: Abnormal   Collection Time: 11/20/15  1:38 PM  Result Value Ref Range   WBC 17.5 (H) 4.0 - 10.5 K/uL   RBC 3.87 3.87 - 5.11 MIL/uL   Hemoglobin 9.6 (L) 12.0 - 15.0 g/dL   HCT 29.9 (L) 36.0 - 46.0 %   MCV 77.3 (L) 78.0 - 100.0 fL   MCH 24.8 (L) 26.0 - 34.0 pg   MCHC 32.1 30.0 - 36.0 g/dL   RDW 17.6 (H) 11.5 - 15.5 %   Platelets 323 150 - 400 K/uL  Troponin I (q 6hr x 3)     Status: Abnormal   Collection Time: 11/20/15  1:39 PM  Result Value Ref Range   Troponin I 0.17 (H) <0.031 ng/mL    Micro Results: Recent Results (from the past 240 hour(s))  Blood culture (routine x 2)     Status: None (Preliminary result)   Collection  Time: 11/19/15 10:50 AM  Result Value Ref Range Status   Specimen Description BLOOD RIGHT ANTECUBITAL  Final   Special Requests   Final    BOTTLES DRAWN AEROBIC AND ANAEROBIC 10CC AER 4 CC ANA   Culture NO GROWTH 1 DAY  Final   Report Status PENDING  Incomplete  Blood culture (routine x 2)     Status: None (Preliminary result)   Collection Time: 11/19/15 10:55 AM  Result Value Ref Range Status   Specimen Description BLOOD RIGHT HAND  Final   Special Requests BOTTLES DRAWN AEROBIC ONLY 10CC  Final   Culture NO GROWTH 1 DAY  Final   Report Status PENDING  Incomplete   Studies/Results: Dg Chest 1 View  11/19/2015  CLINICAL DATA:  Port-A-Cath placement.  Systemic lupus erythematosus EXAM: CHEST 1 VIEW COMPARISON:  Study obtained earlier in the day FINDINGS: Port-A-Cath tip at cavoatrial junction. No pneumothorax. Lungs clear. Heart size and pulmonary vascularity normal. No adenopathy. No bone lesions. IMPRESSION: Port-A-Cath tip at cavoatrial junction. No pneumothorax. Lungs clear. Electronically Signed   By: Lowella Grip III M.D.   On: 11/19/2015 11:31   Dg Chest 2 View  11/19/2015  CLINICAL DATA:  Shortness of breath and chest pain for 2 days EXAM: CHEST  2 VIEW COMPARISON:  May 18, 2011 FINDINGS: Port-A-Cath tip is at the cavoatrial junction. No pneumothorax. There is mild scarring in the left upper lobe. The lungs elsewhere clear. Heart size and pulmonary vascularity are normal. No adenopathy. No bone lesions. IMPRESSION: Port-A-Cath tip at cavoatrial junction. No pneumothorax. No edema or consolidation. Mild scarring left upper lobe, stable. Electronically Signed   By: Lowella Grip III M.D.   On: 11/19/2015 09:21   Mr Hip Right W Wo Contrast  11/20/2015  CLINICAL DATA:  Patient had right  hip replacement which was subsequently removed secondary to infection. Extreme pain in the right hip. EXAM: MRI OF THE RIGHT HIP WITHOUT AND WITH CONTRAST TECHNIQUE: Multiplanar, multisequence MR  imaging was performed both before and after administration of intravenous contrast. CONTRAST:  94m MULTIHANCE GADOBENATE DIMEGLUMINE 529 MG/ML IV SOLN COMPARISON:  CT right hip 11/19/2015 FINDINGS: Bones: Evidence of prior right arthroplasty with subsequent removal of the orthopedic hardware with postsurgical changes in the proximal right femur. There is a large amount of fluid in the residual right hip joint with a tract of fluid extending posterior to the greater trochanters towards the iliotibial band with thick peripheral enhancement and a fluid fluid level. No left hip fracture or dislocation. Subchondral serpiginous signal abnormality in the superior left femoral head most consistent with avascular necrosis without articular surface collapse or surrounding edema. Normal sacrum and sacroiliac joints. Articular cartilage Articular cartilage:  No focal chondral defect of the left hip. Joint or bursal effusion Joint effusion:  No left joint effusion. Bursae:  No bursa formation. Muscles and tendons Flexors: Normal. Extensors: Normal. Abductors: Normal. Adductors: Normal. Rotators: Normal. Hamstrings: Normal. Other findings Miscellaneous: No pelvic free fluid. No inguinal lymphadenopathy. No inguinal hernia. IMPRESSION: 1. Evidence of prior right arthroplasty with subsequent removal of the orthopedic hardware with postsurgical changes in the proximal right femur. Large amount of fluid in the residual right hip joint with a tract of fluid extending posterior to the greater trochanters towards the iliotibial band with thick peripheral enhancement and a fluid fluid level which may reflect a hematoma versus abscess. If there is concern regarding infection, recommend aspiration. 2. 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 Electronically Signed   By: HKathreen Devoid  On: 11/20/2015 09:17   Ct Hip Right Wo Contrast  11/19/2015  CLINICAL DATA:  Chronic right hip  pain. Patient reports 23 surgery since 2012. 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 EXAM: CT OF THE RIGHT HIP WITHOUT CONTRAST TECHNIQUE: Multidetector CT imaging of the right hip was performed according to the standard protocol. Multiplanar CT image reconstructions were also generated. COMPARISON:  Abdominal pelvic CT 07/31/2011. Right hip CT 05/12/2011. Right hip radiographs 11/19/2015. FINDINGS: As demonstrated on recent radiographs, the patient has undergone removal of the right total hip arthroplasty and resection of the right femoral head and neck compared with the prior CTs. There is extensive posttraumatic deformity of the proximal right femoral diaphysis consistent with a healed fracture. The right acetabulum is also diffusely eroded and sclerotic. No active bone destruction identified. The right femur is laterally displaced relative to the pelvis. The right sacroiliac joint and symphysis pubis appear unremarkable. There is no evidence of acute fracture or dislocation. Postsurgical changes are present within the subcutaneous fat anterior and lateral to the right hip. There is ill-defined mixed intermediate and low-density between the right femur and acetabulum with multiple small bone fragments. No inflammatory changes identified within the surrounding muscles or fat. IVC filter noted. IMPRESSION: 1. Posttraumatic deformity of the proximal right femur status post removal of right total hip arthroplasty and resection of the right femoral head and neck. The acetabulum is eroded. 2. No CT evidence of active osteomyelitis. 3. Nonspecific mixed soft tissue and low-density between the acetabulum and femur, likely synovitis. Ongoing soft tissue infection in this area cannot be completely excluded. Electronically Signed   By: WRichardean SaleM.D.   On: 11/19/2015 16:31   Dg Hip Unilat With Pelvis  2-3 Views Right  11/19/2015  CLINICAL DATA:  Extreme RIGHT hip pain worsening,  multiple prior RIGHT hip surgeries, failed hip replacement, infection EXAM: DG HIP (WITH OR WITHOUT PELVIS) 2-3V RIGHT COMPARISON:  None FINDINGS: Surgical absence of the RIGHT femoral head and neck without prosthesis. Deformity of the proximal RIGHT femur post fracture. Osseous demineralization. Pelvis appears intact. No acute fracture or bone destruction. IVC filter noted. 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 No definite acute bony abnormalities. Electronically Signed   By: Ulyses Southward M.D.   On: 11/19/2015 11:46   Medications: I have reviewed the patient's current medications. Scheduled Meds: . amLODipine  5 mg Oral Daily  . budesonide-formoterol  2 puff Inhalation BID  . cloNIDine  0.2 mg Oral BID  . [START ON 11/21/2015] feeding supplement (ENSURE ENLIVE)  237 mL Oral Q24H  . gabapentin  300 mg Oral TID  . heparin  5,000 Units Subcutaneous 3 times per day  . hydroxychloroquine  200 mg Oral BID  . montelukast  10 mg Oral Daily  . multivitamin with minerals  1 tablet Oral Daily  . predniSONE  60 mg Oral Q breakfast  . zolpidem  10 mg Oral QHS   Continuous Infusions: . sodium chloride 100 mL/hr at 11/20/15 1313   PRN Meds:.albuterol, cyclobenzaprine, diclofenac sodium, dicyclomine, HYDROmorphone (DILAUDID) injection, ondansetron, oxyCODONE, sodium chloride, traMADol Assessment/Plan: Active Problems:   Lupus (HCC)  Lupus flare: Patient with lupus diagnosed since 12 years, with a 14 day history of diffuse joint pain, and chest wall pain that happens when she has flare ups and pain notable in her right hip . Patient is chronically immuno suppressed with 60 mg prednisone daily for past 2 years (and more tapers in the past), and she also gets plaquenil and some weekly infusion which she has not been getting. The flare up most likely due to her not receiving her proper  meds, and also because she was not able to refill her oxycodone and tramadol which she usually uses to treat her pain. Labs are notable for ESR of 55, and WC of 18. Yesterday, pt refused her prednisone but is amenable to starting that today  Today, her white count is 17.5  I discussed with Dr. Nedra Hai her PCP in [place] [phone]), and she said that the pt frequently gets lupus flares, and is treated with prednisone and plaquenil, being managed by Rheumatology- whose notes she was able to pull up. She said that the pt is not on any weekly infusions for her Lupus. The Port was probably put in few months ago for her hip infection and avascular necrosis when she was receiving prolonged IV antibiotics . Back in August, she was admitted for GI bleed.  Back in October, she was admitted for right hip infection and the culture was positive for MRSA.   -pain control with oxycodone 15 mg q6 hours PRN, and tramadol 50 mg q4 hours PRN. , and dilaudid 0.5 mg IV q4 hours PRN.  -prednisone 60 mg continued -plaquenil 200 mg bid  - voltaren gel -gabapentin  -repeat CBC and bmet   -Her PORT MAy need to come out   Right hip joint pain int he setting of multiple previous joint infections and surgeries (23 in total) s/p removal of right hip arthroplasty : As her pain has worsened and she is chronically immunosuppressed, there was a concern for septic joint ,  and avascular necrosis. So, orthopaedics was consulted yesterday- who said that they do not recommend doing arthrocentesis as the site does not look inflamed and no drainage coming out. She basically does not have a right hip as all the hardware has come out and she is status post removal of right hip total arthroplasty. CT confirmed this and said there was no osteomyelitis. As on going soft tissue infection can not be excluded, they recommended considering an MRI.    MRI today showed a fluid collection, and an abscess could not be ruled out. - Ordered IR  guided aspiration of abscess   I discussed with DR. Yates in [facility] who agreed with ordering IR guided aspiration. He recommended holding off on antibiotics until the sample is obtained. Previously, her Right hip fluid collection was positive for MRSA and she was treated with Vanc, per Dr. Truman Hayward in [facility].   -pain control with oxycodone and tramadol and dilaudid PRN  -Ordered IR for evaluation regarding CT guided aspiration -PT OT eval -flexeril    AKI: patient with several days of n/v/d, resolved since yesterday morning. Pt able to have po intake and was eager to restart appetite. Her creatinine was elevated to 1.28 and baseline seems around 0.7. Could be due to dehydration or a manifestation of lupus  Creatinine has improved to 0.97 today   -IV hydration 100 ml/hr and discontinue tomorrow  -repeat bmet tomorrow   N/v/d: resolved -zofran PRN, and bentyl  Breast mass: -needs outpatient mammogram appointment that will be done once she goes to our clinic to establish as patient   - I talked with case manager about setting up appt at  [facility] - I set her a one time hospital follow up in our clinic   HTN: Pt normotensive  -clonidine  Asthma: Continue albuterol, symbicort, and singulair   F 100 cc/hr E N reg diet    Dispo: Disposition is deferred at this time, awaiting improvement of current medical problems.  Anticipated discharge in approximately 2 day(s).   The patient does not have a current PCP (No Pcp Per Patient) and does need an [facility] hospital follow-up appointment after discharge.  The patient does not have transportation limitations that hinder transportation to clinic appointments.  .Services Needed at time of discharge: Y = Yes, Blank = No PT:   OT:   RN:   Equipment:   Other:       [name], MD 11/20/2015, 3:13 PM
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
## 2015-11-20 NOTE — Progress Notes (Signed)
Patient seen and examined. Case d/w residents in detail.  HPI: 50 y/o female with PMH of SLE, DVTs s/p IVC filter, HTN, asthma, right hip AVN s/p arthroplasty complicated by multiple, recurrent infections who p/w worsening R hip pain. Patient recently moved to [facility] from [place] and has not been set up with doctors here. Patient initially developed n/v and diarrhea at the beginning of the month and then started having painful and swollen joints consistent with her lupus flares and then started having pain in her right hip. States pain in her hip is similar to previous infections she has had in that hip. She is unable  To bear weight on that leg and states that prior to this she was able to ambulate well with her walker. Patient restarted on her pain meds here and states pain is better controlled today. No fevers/chills, no CP, no sob, no abd pain, no syncope, no HA, no blurry vision and no focal weakness.  Physical Exam: Gen: AAO*3, NAD CVS: RRR, normal heart sounds Lungs: CTA b/l Abd: soft, non tender, BS + Ext: no edema, right hip scars +, no erythema, no local warmth, mild tenderness to palpation  Assessment and Plan: Patient is a 50 y/o female with worsening right hip pain. MRI right hip with large amount of fluid possibly hematoma v/s abscess. Case d/w ortho- will arrange for CT guided aspiration. Will hold off on starting abx for now. Previously patient had MRSA infection in her right hip. Will c/w pain control for now. Will get PT/OT eval/  AKI is resolving with IVF likely prerenal. Will monitor.  Patient with possible lupus flare likely secondary to missing her home meds. Resumed her plaquenil and prednisone here. Joint pain is improving. Patient is improving. Doubt worsening lupus. Will monitor closely. Does have a chest port- per resident discussion with patient old PCP there is no clear reason for port.

## 2015-11-20 NOTE — Progress Notes (Signed)
Pt is stable at this time sitting in bed. Refuse MDI

## 2015-11-21 DIAGNOSIS — M329 Systemic lupus erythematosus, unspecified: Secondary | ICD-10-CM | POA: Diagnosis present

## 2015-11-21 DIAGNOSIS — N179 Acute kidney failure, unspecified: Secondary | ICD-10-CM | POA: Diagnosis not present

## 2015-11-21 DIAGNOSIS — Z9889 Other specified postprocedural states: Secondary | ICD-10-CM | POA: Diagnosis not present

## 2015-11-21 DIAGNOSIS — M25559 Pain in unspecified hip: Secondary | ICD-10-CM | POA: Insufficient documentation

## 2015-11-21 DIAGNOSIS — M25551 Pain in right hip: Secondary | ICD-10-CM | POA: Diagnosis not present

## 2015-11-21 LAB — BASIC METABOLIC PANEL
Anion gap: 6 (ref 5–15)
BUN: 14 mg/dL (ref 6–20)
CHLORIDE: 107 mmol/L (ref 101–111)
CO2: 26 mmol/L (ref 22–32)
CREATININE: 1.01 mg/dL — AB (ref 0.44–1.00)
Calcium: 8.9 mg/dL (ref 8.9–10.3)
GFR calc Af Amer: >60 mL/min (ref >60)
GFR calc non Af Amer: >60 mL/min (ref >60)
GLUCOSE: 165 mg/dL — AB (ref 65–99)
POTASSIUM: 3.8 mmol/L (ref 3.5–5.1)
SODIUM: 139 mmol/L (ref 135–145)

## 2015-11-21 LAB — CBC
HCT: 26.1 % — ABNORMAL LOW (ref 36.0–46.0)
HEMOGLOBIN: 8.5 g/dL — AB (ref 12.0–15.0)
MCH: 25.1 pg — AB (ref 26.0–34.0)
MCHC: 32.6 g/dL (ref 30.0–36.0)
MCV: 77 fL — AB (ref 78.0–100.0)
PLATELETS: 301 10*3/uL (ref 150–400)
RBC: 3.39 MIL/uL — AB (ref 3.87–5.11)
RDW: 17.9 % — ABNORMAL HIGH (ref 11.5–15.5)
WBC: 17.6 10*3/uL — ABNORMAL HIGH (ref 4.0–10.5)

## 2015-11-21 MED ORDER — BACITRACIN ZINC 500 UNIT/GM EX OINT
TOPICAL_OINTMENT | Freq: Two times a day (BID) | CUTANEOUS | Status: DC
  Administered 2015-11-21 – 2015-11-23 (×5): via TOPICAL
  Administered 2015-11-24: 1 via TOPICAL
  Administered 2015-11-25 – 2015-12-01 (×6): via TOPICAL
  Filled 2015-11-21 (×2): qty 28.35

## 2015-11-21 NOTE — Progress Notes (Signed)
  Date: 11/21/2015  Patient name: Katherine Banks  Medical record number: [id number]  Date of birth: 10/26/1966   This patient has been seen and the plan of care was discussed with the house staff. Please see their note for complete details. I concur with their findings with the following additions/corrections:   Agree with holding off on Abx for now.  If any fever, would pan culture and start Abx.  It is not clear that fluid collection is indeed an abscess, but that would be one of the concerning differentials.  Continue to monitor closely.  She does not wish to be weight bearing today due to not wanting to "make things worse."  I advised her to tell PT that she wishes to do bed exercises only.   Sid Falcon, MD 11/21/2015, 10:49 AM

## 2015-11-21 NOTE — Progress Notes (Signed)
CM received consult:"no PCP in [place] to write RX.      Follow up with [facility] On 12/15/2015.   Why: Follow up hospital visit and to establish PCP scheduled appointment 12/15/2015 at 10am, [name] NP    Contact information:   [place] [facility]  CM made pt aware. Whitman Hero RN,BSN,CM [phone]

## 2015-11-21 NOTE — Progress Notes (Signed)
Subjective: Pt states she attempted to get out of bed to use the restroom but had difficulty with ambulation and required 2 person assist. She thinks she is getting a skin infection around her umbilicus and requesting something for it.     Objective: Vital signs in last 24 hours: Filed Vitals:   11/20/15 1110 11/20/15 1550 11/20/15 2139 11/21/15 0533  BP:  120/70 112/73 104/69  Pulse:  84 84 81  Temp:  98 F (36.7 C) 98.5 F (36.9 C) 99.1 F (37.3 C)  TempSrc:  Oral Oral Oral  Resp:  '18 18 18  '$ Height: '5\' 6"'$  (1.676 m)     Weight: 250 lb (113.399 kg)     SpO2:  95% 99% 97%   Weight change:   Intake/Output Summary (Last 24 hours) at 11/21/15 0859 Last data filed at 11/21/15 0526  Gross per 24 hour  Intake 2333.34 ml  Output    800 ml  Net 1533.34 ml   General: NAD. Patient lying in bed HEENT: moist mucous membranes Cardiovascular: Reg rate , Reg rhythm, no murmur appreciated  Pulmonary/Chest: Clear to auscultation bilaterally, no wheezesor crackles Abdominal: Soft, obese, tender to palpation in RLQ and epigastric area, umbilicus pink without drainage Hip: Right hip has post surgical incisions, clean dry intact, no drainage or erythema, limited ROm of right hip  Skin: dry and warm    Lab Results: Results for orders placed or performed during the hospital encounter of 11/19/15 (from the past 24 hour(s))  CBC     Status: Abnormal   Collection Time: 11/20/15  1:38 PM  Result Value Ref Range   WBC 17.5 (H) 4.0 - 10.5 K/uL   RBC 3.87 3.87 - 5.11 MIL/uL   Hemoglobin 9.6 (L) 12.0 - 15.0 g/dL   HCT 29.9 (L) 36.0 - 46.0 %   MCV 77.3 (L) 78.0 - 100.0 fL   MCH 24.8 (L) 26.0 - 34.0 pg   MCHC 32.1 30.0 - 36.0 g/dL   RDW 17.6 (H) 11.5 - 15.5 %   Platelets 323 150 - 400 K/uL  Troponin I (q 6hr x 3)     Status: Abnormal   Collection Time: 11/20/15  1:39 PM  Result Value Ref Range   Troponin I 0.17 (H) <0.031 ng/mL  CBC     Status: Abnormal   Collection Time: 11/21/15   4:28 AM  Result Value Ref Range   WBC 17.6 (H) 4.0 - 10.5 K/uL   RBC 3.39 (L) 3.87 - 5.11 MIL/uL   Hemoglobin 8.5 (L) 12.0 - 15.0 g/dL   HCT 26.1 (L) 36.0 - 46.0 %   MCV 77.0 (L) 78.0 - 100.0 fL   MCH 25.1 (L) 26.0 - 34.0 pg   MCHC 32.6 30.0 - 36.0 g/dL   RDW 17.9 (H) 11.5 - 15.5 %   Platelets 301 150 - 400 K/uL  Basic metabolic panel     Status: Abnormal   Collection Time: 11/21/15  4:28 AM  Result Value Ref Range   Sodium 139 135 - 145 mmol/L   Potassium 3.8 3.5 - 5.1 mmol/L   Chloride 107 101 - 111 mmol/L   CO2 26 22 - 32 mmol/L   Glucose, Bld 165 (H) 65 - 99 mg/dL   BUN 14 6 - 20 mg/dL   Creatinine, Ser 1.01 (H) 0.44 - 1.00 mg/dL   Calcium 8.9 8.9 - 10.3 mg/dL   GFR calc non Af Amer >60 >60 mL/min   GFR calc Af Amer >60 >  60 mL/min   Anion gap 6 5 - 15    Micro Results: Recent Results (from the past 240 hour(s))  Blood culture (routine x 2)     Status: None (Preliminary result)   Collection Time: 11/19/15 10:50 AM  Result Value Ref Range Status   Specimen Description BLOOD RIGHT ANTECUBITAL  Final   Special Requests   Final    BOTTLES DRAWN AEROBIC AND ANAEROBIC 10CC AER 4 CC ANA   Culture NO GROWTH 1 DAY  Final   Report Status PENDING  Incomplete  Blood culture (routine x 2)     Status: None (Preliminary result)   Collection Time: 11/19/15 10:55 AM  Result Value Ref Range Status   Specimen Description BLOOD RIGHT HAND  Final   Special Requests BOTTLES DRAWN AEROBIC ONLY 10CC  Final   Culture NO GROWTH 1 DAY  Final   Report Status PENDING  Incomplete   Studies/Results: Dg Chest 1 View  11/19/2015  CLINICAL DATA:  Port-A-Cath placement.  Systemic lupus erythematosus EXAM: CHEST 1 VIEW COMPARISON:  Study obtained earlier in the day FINDINGS: Port-A-Cath tip at cavoatrial junction. No pneumothorax. Lungs clear. Heart size and pulmonary vascularity normal. No adenopathy. No bone lesions. IMPRESSION: Port-A-Cath tip at cavoatrial junction. No pneumothorax. Lungs clear.  Electronically Signed   By: Lowella Grip III M.D.   On: 11/19/2015 11:31   Dg Chest 2 View  11/19/2015  CLINICAL DATA:  Shortness of breath and chest pain for 2 days EXAM: CHEST  2 VIEW COMPARISON:  May 18, 2011 FINDINGS: Port-A-Cath tip is at the cavoatrial junction. No pneumothorax. There is mild scarring in the left upper lobe. The lungs elsewhere clear. Heart size and pulmonary vascularity are normal. No adenopathy. No bone lesions. IMPRESSION: Port-A-Cath tip at cavoatrial junction. No pneumothorax. No edema or consolidation. Mild scarring left upper lobe, stable. Electronically Signed   By: Lowella Grip III M.D.   On: 11/19/2015 09:21   Mr Hip Right W Wo Contrast  11/20/2015  CLINICAL DATA:  Patient had right hip replacement which was subsequently removed secondary to infection. Extreme pain in the right hip. EXAM: MRI OF THE RIGHT HIP WITHOUT AND WITH CONTRAST TECHNIQUE: Multiplanar, multisequence MR imaging was performed both before and after administration of intravenous contrast. CONTRAST:  56m MULTIHANCE GADOBENATE DIMEGLUMINE 529 MG/ML IV SOLN COMPARISON:  CT right hip 11/19/2015 FINDINGS: Bones: Evidence of prior right arthroplasty with subsequent removal of the orthopedic hardware with postsurgical changes in the proximal right femur. There is a large amount of fluid in the residual right hip joint with a tract of fluid extending posterior to the greater trochanters towards the iliotibial band with thick peripheral enhancement and a fluid fluid level. No left hip fracture or dislocation. Subchondral serpiginous signal abnormality in the superior left femoral head most consistent with avascular necrosis without articular surface collapse or surrounding edema. Normal sacrum and sacroiliac joints. Articular cartilage Articular cartilage:  No focal chondral defect of the left hip. Joint or bursal effusion Joint effusion:  No left joint effusion. Bursae:  No bursa formation. Muscles and  tendons Flexors: Normal. Extensors: Normal. Abductors: Normal. Adductors: Normal. Rotators: Normal. Hamstrings: Normal. Other findings Miscellaneous: No pelvic free fluid. No inguinal lymphadenopathy. No inguinal hernia. IMPRESSION: 1. Evidence of prior right arthroplasty with subsequent removal of the orthopedic hardware with postsurgical changes in the proximal right femur. Large amount of fluid in the residual right hip joint with a tract of fluid extending posterior to the greater trochanters  towards the iliotibial band with thick peripheral enhancement and a fluid fluid level which may reflect a hematoma versus abscess. If there is concern regarding infection, recommend aspiration. 2. 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 Electronically Signed   By: Kathreen Devoid   On: 11/20/2015 09:17   Ct Hip Right Wo Contrast  11/19/2015  CLINICAL DATA:  Chronic right hip pain. Patient reports 23 surgery since 2012. 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 EXAM: CT OF THE RIGHT HIP WITHOUT CONTRAST TECHNIQUE: Multidetector CT imaging of the right hip was performed according to the standard protocol. Multiplanar CT image reconstructions were also generated. COMPARISON:  Abdominal pelvic CT 07/31/2011. Right hip CT 05/12/2011. Right hip radiographs 11/19/2015. FINDINGS: As demonstrated on recent radiographs, the patient has undergone removal of the right total hip arthroplasty and resection of the right femoral head and neck compared with the prior CTs. There is extensive posttraumatic deformity of the proximal right femoral diaphysis consistent with a healed fracture. The right acetabulum is also diffusely eroded and sclerotic. No active bone destruction identified. The right femur is laterally displaced relative to the pelvis. The right sacroiliac joint and symphysis pubis appear unremarkable. There is no evidence of acute  fracture or dislocation. Postsurgical changes are present within the subcutaneous fat anterior and lateral to the right hip. There is ill-defined mixed intermediate and low-density between the right femur and acetabulum with multiple small bone fragments. No inflammatory changes identified within the surrounding muscles or fat. IVC filter noted. IMPRESSION: 1. Posttraumatic deformity of the proximal right femur status post removal of right total hip arthroplasty and resection of the right femoral head and neck. The acetabulum is eroded. 2. No CT evidence of active osteomyelitis. 3. Nonspecific mixed soft tissue and low-density between the acetabulum and femur, likely synovitis. Ongoing soft tissue infection in this area cannot be completely excluded. Electronically Signed   By: Richardean Sale M.D.   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 Deformity of the proximal RIGHT femur post fracture. Osseous demineralization. Pelvis appears intact. No acute fracture or bone destruction. IVC filter noted. 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 No definite acute bony abnormalities. Electronically Signed   By: Lavonia Dana M.D.   On: 11/19/2015 11:46   Medications: I have reviewed the patient's current medications. Scheduled Meds: . amLODipine  5 mg Oral Daily  . bacitracin   Topical BID  . budesonide-formoterol  2 puff Inhalation BID  . cloNIDine  0.2 mg Oral BID  . feeding supplement (ENSURE ENLIVE)  237 mL Oral Q24H  . gabapentin  300 mg Oral TID  . heparin  5,000 Units Subcutaneous 3  times per day  . hydroxychloroquine  200 mg Oral BID  . montelukast  10 mg Oral Daily  . multivitamin with minerals  1 tablet Oral Daily  . predniSONE  60 mg Oral Q breakfast  . zolpidem  10 mg Oral QHS   Continuous Infusions: . sodium chloride 100 mL/hr at 11/20/15 2323   PRN Meds:.albuterol, cyclobenzaprine, diclofenac sodium, dicyclomine, HYDROmorphone (DILAUDID)  injection, ondansetron, oxyCODONE, sodium chloride, traMADol Assessment/Plan: Active Problems:   Lupus (HCC)  Lupus flare: likely due to not receiving her lupus meds. Labs are notable for ESR of 55, and WC of 18 on admission. All of her home medications were resumed, she is feeling better today with the exception of her right hip pain.   -pain control with oxycodone 15 mg q6 hours PRN, tramadol 50 mg q4 hours PRN, and dilaudid 0.5 mg IV q4 hours PRN.  -prednisone 60 mg continued -plaquenil 200 mg bid  - voltaren gel - gabapentin   Right hip joint pain in the setting of multiple previous joint infections and surgeries (23 in total) s/p removal of right hip arthroplasty : As her pain has worsened and she is chronically immunosuppressed, there was a concern for septic joint , and avascular necrosis. MRI + fluid collection, and an abscess could not be ruled out. - IR guided aspiration of abscess scheduled for Monday - pain control with oxycodone and tramadol and dilaudid PRN  - PT OT eval order placed today - flexeril  - can consider removing rt chest port if she does not require abx if fluid is neg for infxn  AKI: likely pre renal as patient presented w/ several days of n/v/d. No longer having those sx, tolerating reg diet. Creatinine stable today at 1.01 from 0.97 yesterday, last Cr was 0.73 in 2012 unclear what her baseline is.  -stopped IVFs now that she is tolerating po intake, repeat bmet tomorrow   Breast mass-- will need f/u mammogram as outpatient  HTN: Pt normotensive  -clonidine  Asthma: Continue albuterol,  symbicort, and singulair   FEN-- reg diet. D/c'd IVFs.   DVT ppx-- hep  Dispo: Disposition is deferred at this time, awaiting improvement of current medical problems.    The patient does not have a current PCP (No Pcp Per Patient) and does need an [facility] hospital follow-up appointment after discharge.  The patient does not have transportation limitations that hinder transportation to clinic appointments.  .Services Needed at time of discharge: Y = Yes, Blank = No PT:   OT:   RN:   Equipment:   Other:       Norman Herrlich, MD 11/21/2015, 8:59 AM

## 2015-11-22 DIAGNOSIS — E860 Dehydration: Secondary | ICD-10-CM | POA: Diagnosis present

## 2015-11-22 DIAGNOSIS — Z6841 Body Mass Index (BMI) 40.0 and over, adult: Secondary | ICD-10-CM | POA: Diagnosis not present

## 2015-11-22 DIAGNOSIS — M25551 Pain in right hip: Secondary | ICD-10-CM | POA: Diagnosis not present

## 2015-11-22 DIAGNOSIS — Y838 Other surgical procedures as the cause of abnormal reaction of the patient, or of later complication, without mention of misadventure at the time of the procedure: Secondary | ICD-10-CM | POA: Diagnosis not present

## 2015-11-22 DIAGNOSIS — E099 Drug or chemical induced diabetes mellitus without complications: Secondary | ICD-10-CM | POA: Diagnosis present

## 2015-11-22 DIAGNOSIS — I10 Essential (primary) hypertension: Secondary | ICD-10-CM | POA: Diagnosis present

## 2015-11-22 DIAGNOSIS — L02415 Cutaneous abscess of right lower limb: Secondary | ICD-10-CM | POA: Diagnosis not present

## 2015-11-22 DIAGNOSIS — Z955 Presence of coronary angioplasty implant and graft: Secondary | ICD-10-CM | POA: Diagnosis not present

## 2015-11-22 DIAGNOSIS — B9689 Other specified bacterial agents as the cause of diseases classified elsewhere: Secondary | ICD-10-CM | POA: Diagnosis not present

## 2015-11-22 DIAGNOSIS — B9562 Methicillin resistant Staphylococcus aureus infection as the cause of diseases classified elsewhere: Secondary | ICD-10-CM | POA: Diagnosis present

## 2015-11-22 DIAGNOSIS — M329 Systemic lupus erythematosus, unspecified: Secondary | ICD-10-CM | POA: Diagnosis not present

## 2015-11-22 DIAGNOSIS — R079 Chest pain, unspecified: Secondary | ICD-10-CM | POA: Diagnosis present

## 2015-11-22 DIAGNOSIS — H109 Unspecified conjunctivitis: Secondary | ICD-10-CM | POA: Diagnosis not present

## 2015-11-22 DIAGNOSIS — M868X8 Other osteomyelitis, other site: Secondary | ICD-10-CM | POA: Diagnosis not present

## 2015-11-22 DIAGNOSIS — T8451XD Infection and inflammatory reaction due to internal right hip prosthesis, subsequent encounter: Secondary | ICD-10-CM | POA: Diagnosis not present

## 2015-11-22 DIAGNOSIS — Z7952 Long term (current) use of systemic steroids: Secondary | ICD-10-CM | POA: Diagnosis not present

## 2015-11-22 DIAGNOSIS — Z86718 Personal history of other venous thrombosis and embolism: Secondary | ICD-10-CM | POA: Diagnosis not present

## 2015-11-22 DIAGNOSIS — N179 Acute kidney failure, unspecified: Secondary | ICD-10-CM | POA: Diagnosis not present

## 2015-11-22 DIAGNOSIS — R700 Elevated erythrocyte sedimentation rate: Secondary | ICD-10-CM | POA: Diagnosis present

## 2015-11-22 DIAGNOSIS — D72829 Elevated white blood cell count, unspecified: Secondary | ICD-10-CM | POA: Diagnosis not present

## 2015-11-22 DIAGNOSIS — N390 Urinary tract infection, site not specified: Secondary | ICD-10-CM | POA: Diagnosis present

## 2015-11-22 DIAGNOSIS — R32 Unspecified urinary incontinence: Secondary | ICD-10-CM | POA: Diagnosis present

## 2015-11-22 DIAGNOSIS — Z9889 Other specified postprocedural states: Secondary | ICD-10-CM | POA: Diagnosis not present

## 2015-11-22 DIAGNOSIS — J45909 Unspecified asthma, uncomplicated: Secondary | ICD-10-CM | POA: Diagnosis present

## 2015-11-22 DIAGNOSIS — M8660 Other chronic osteomyelitis, unspecified site: Secondary | ICD-10-CM | POA: Diagnosis present

## 2015-11-22 DIAGNOSIS — N63 Unspecified lump in breast: Secondary | ICD-10-CM | POA: Diagnosis present

## 2015-11-22 DIAGNOSIS — T380X5A Adverse effect of glucocorticoids and synthetic analogues, initial encounter: Secondary | ICD-10-CM | POA: Diagnosis present

## 2015-11-22 DIAGNOSIS — Z87891 Personal history of nicotine dependence: Secondary | ICD-10-CM | POA: Diagnosis not present

## 2015-11-22 DIAGNOSIS — Z9114 Patient's other noncompliance with medication regimen: Secondary | ICD-10-CM | POA: Diagnosis not present

## 2015-11-22 DIAGNOSIS — Z8542 Personal history of malignant neoplasm of other parts of uterus: Secondary | ICD-10-CM | POA: Diagnosis not present

## 2015-11-22 LAB — CBC
HEMATOCRIT: 25.6 % — AB (ref 36.0–46.0)
Hemoglobin: 8.3 g/dL — ABNORMAL LOW (ref 12.0–15.0)
MCH: 24.9 pg — AB (ref 26.0–34.0)
MCHC: 32.4 g/dL (ref 30.0–36.0)
MCV: 76.6 fL — AB (ref 78.0–100.0)
PLATELETS: 343 10*3/uL (ref 150–400)
RBC: 3.34 MIL/uL — ABNORMAL LOW (ref 3.87–5.11)
RDW: 18.2 % — AB (ref 11.5–15.5)
WBC: 19.3 10*3/uL — AB (ref 4.0–10.5)

## 2015-11-22 LAB — BASIC METABOLIC PANEL
Anion gap: 8 (ref 5–15)
BUN: 15 mg/dL (ref 6–20)
CHLORIDE: 108 mmol/L (ref 101–111)
CO2: 24 mmol/L (ref 22–32)
CREATININE: 0.83 mg/dL (ref 0.44–1.00)
Calcium: 9 mg/dL (ref 8.9–10.3)
GFR calc Af Amer: >60 mL/min (ref >60)
GFR calc non Af Amer: >60 mL/min (ref >60)
GLUCOSE: 180 mg/dL — AB (ref 65–99)
POTASSIUM: 3.7 mmol/L (ref 3.5–5.1)
SODIUM: 140 mmol/L (ref 135–145)

## 2015-11-22 LAB — TROPONIN I: Troponin I: 0.03 ng/mL (ref <0.031)

## 2015-11-22 MED ORDER — HYDROMORPHONE HCL 1 MG/ML IJ SOLN
0.5000 mg | Freq: Four times a day (QID) | INTRAMUSCULAR | Status: DC | PRN
  Administered 2015-11-22: 0.5 mg via INTRAVENOUS

## 2015-11-22 MED ORDER — HYDROMORPHONE HCL 1 MG/ML IJ SOLN
0.5000 mg | INTRAMUSCULAR | Status: DC | PRN
  Administered 2015-11-22 – 2015-11-24 (×14): 0.5 mg via INTRAVENOUS
  Filled 2015-11-22 (×14): qty 1

## 2015-11-22 NOTE — Progress Notes (Signed)
BP 114/42 at 9 pm, pt asymptomatic. MD paged about holding Clonidine. BP rechecked 132/67 at 9:40 pm. Clonidine given. Will continue to monitor.

## 2015-11-22 NOTE — Progress Notes (Signed)
  Date: 11/22/2015  Patient name: Katherine Banks  Medical record number: [id number]  Date of birth: Apr 09, 1966   This patient has been seen and the plan of care was discussed with the house staff. Please see their note for complete details. I concur with their findings.     Sid Falcon, MD 11/22/2015, 11:47 AM

## 2015-11-22 NOTE — Progress Notes (Signed)
Subjective: Pt reports having an episode of urinary incontinence while sleeping and another while up in the recliner. She states she could not feel herself urinating, this has happened in the past around 5-6 years ago.      Objective: Vital signs in last 24 hours: Filed Vitals:   11/21/15 2101 11/21/15 2141 11/22/15 0623 11/22/15 0900  BP: 114/42 132/67 131/75 119/71  Pulse: 97 93 79 84  Temp: 99 F (37.2 C)  98.4 F (36.9 C)   TempSrc: Oral  Oral   Resp: 16  16   Height:      Weight:      SpO2: 94%  100%    Weight change:   Intake/Output Summary (Last 24 hours) at 11/22/15 1037 Last data filed at 11/22/15 0200  Gross per 24 hour  Intake    540 ml  Output    300 ml  Net    240 ml   General: NAD. Patient lying in bed HEENT: moist mucous membranes Cardiovascular: Reg rate , Reg rhythm, no murmur appreciated  Pulmonary/Chest: Clear to auscultation bilaterally, no wheezesor crackles Abdominal: Soft, obese, tender to palpation in RLQ and epigastric area, mildly tender to suprapubic palpation GU: CVA tenderness b/l Hip: Right hip has post surgical incisions, clean dry intact, no drainage or erythema, limited ROm of right hip  Skin: dry and warm    Lab Results: Results for orders placed or performed during the hospital encounter of 11/19/15 (from the past 24 hour(s))  Basic metabolic panel     Status: Abnormal   Collection Time: 11/22/15  6:45 AM  Result Value Ref Range   Sodium 140 135 - 145 mmol/L   Potassium 3.7 3.5 - 5.1 mmol/L   Chloride 108 101 - 111 mmol/L   CO2 24 22 - 32 mmol/L   Glucose, Bld 180 (H) 65 - 99 mg/dL   BUN 15 6 - 20 mg/dL   Creatinine, Ser 0.83 0.44 - 1.00 mg/dL   Calcium 9.0 8.9 - 10.3 mg/dL   GFR calc non Af Amer >60 >60 mL/min   GFR calc Af Amer >60 >60 mL/min   Anion gap 8 5 - 15  CBC     Status: Abnormal   Collection Time: 11/22/15  6:45 AM  Result Value Ref Range   WBC 19.3 (H) 4.0 - 10.5 K/uL   RBC 3.34 (L) 3.87 - 5.11 MIL/uL     Hemoglobin 8.3 (L) 12.0 - 15.0 g/dL   HCT 25.6 (L) 36.0 - 46.0 %   MCV 76.6 (L) 78.0 - 100.0 fL   MCH 24.9 (L) 26.0 - 34.0 pg   MCHC 32.4 30.0 - 36.0 g/dL   RDW 18.2 (H) 11.5 - 15.5 %   Platelets 343 150 - 400 K/uL  Troponin I     Status: None   Collection Time: 11/22/15  6:45 AM  Result Value Ref Range   Troponin I 0.03 <0.031 ng/mL    Micro Results: Recent Results (from the past 240 hour(s))  Blood culture (routine x 2)     Status: None (Preliminary result)   Collection Time: 11/19/15 10:50 AM  Result Value Ref Range Status   Specimen Description BLOOD RIGHT ANTECUBITAL  Final   Special Requests   Final    BOTTLES DRAWN AEROBIC AND ANAEROBIC 10CC AER 4 CC ANA   Culture NO GROWTH 2 DAYS  Final   Report Status PENDING  Incomplete  Blood culture (routine x 2)     Status:  None (Preliminary result)   Collection Time: 11/19/15 10:55 AM  Result Value Ref Range Status   Specimen Description BLOOD RIGHT HAND  Final   Special Requests BOTTLES DRAWN AEROBIC ONLY 10CC  Final   Culture NO GROWTH 2 DAYS  Final   Report Status PENDING  Incomplete   Studies/Results: No results found. Medications: I have reviewed the patient's current medications. Scheduled Meds: . amLODipine  5 mg Oral Daily  . bacitracin   Topical BID  . budesonide-formoterol  2 puff Inhalation BID  . cloNIDine  0.2 mg Oral BID  . feeding supplement (ENSURE ENLIVE)  237 mL Oral Q24H  . gabapentin  300 mg Oral TID  . heparin  5,000 Units Subcutaneous 3 times per day  . hydroxychloroquine  200 mg Oral BID  . montelukast  10 mg Oral Daily  . multivitamin with minerals  1 tablet Oral Daily  . predniSONE  60 mg Oral Q breakfast  . zolpidem  10 mg Oral QHS   Continuous Infusions:   PRN Meds:.albuterol, cyclobenzaprine, diclofenac sodium, dicyclomine, HYDROmorphone (DILAUDID) injection, ondansetron, oxyCODONE, sodium chloride, traMADol Assessment/Plan: Active Problems:   Lupus (HCC)   Hip pain   Lupus (systemic  lupus erythematosus) (HCC)   Arthralgia of right hip  Lupus flare: likely 2/2 medication non compliance -pain control with oxycodone 15 mg q6 hours PRN, tramadol 50 mg q4 hours PRN, and dilaudid 0.5 mg IV q6 hours PRN, low threshold to discontinue dilaudid now that she is tolerating po and had 2 episodes of urinary incontinence which could be due to sedation from pain meds.  -prednisone 60 mg -plaquenil 200 mg bid  - voltaren gel - gabapentin   Right hip joint pain in the setting of multiple previous joint infections and surgeries (23 in total) s/p removal of right hip arthroplasty:  MRI + fluid collection, and an abscess could not be ruled out. Pt is on chronic prednisone, Tmax 99.2F overnight, WBC 19.3 up from previous day but stable with WBC of 18 on admission. Blood cultures NGTD. Will continue to hold off on abx at this time until IR guided aspiration is done tomorrow.  - IR guided aspiration of abscess scheduled for Monday - pain control with oxycodone and tramadol and dilaudid PRN  - PT OT eval ordered - flexeril  - can consider removing rt chest port if she does not require abx if fluid is neg for infxn  Urinary incontinence-- ddx include UTI and pain medications. Glucose elevated at 180 this morning. She had CVA tenderness b/l which is more likely 2/2 to rt hip pain.  - UA to ensure there is no infection - hemglobina1c ordered - no longer on IVFs since yesterday, dilaudid was changed to q6h prn from q4h. If UA is negative can consider fluid restriction and weaning down further on dilaudid.   AKI: resolved, Cr 0.83 today and pt is tolerating po intake. D/c'd IVFs yesterday.   Breast mass-- will need f/u mammogram as outpatient  HTN: Pt normotensive  -clonidine  Asthma: Continue albuterol, symbicort, and singulair   FEN-- reg diet, NPO after mn for rt hip aspiration tomorrow.   DVT ppx-- hep  Dispo: Disposition is deferred at this time, awaiting improvement of current  medical problems.    The patient does not have a current PCP (No Pcp Per Patient) and does need an [facility] hospital follow-up appointment after discharge.  The patient does not have transportation limitations that hinder transportation to clinic appointments.  .Services Needed  at time of discharge: Y = Yes, Blank = No PT:   OT:   RN:   Equipment:   Other:       Norman Herrlich, MD 11/22/2015, 10:37 AM

## 2015-11-22 NOTE — Evaluation (Signed)
Physical Therapy Evaluation Patient Details Name: Katherine Banks MRN: [id number] DOB: [birth year] Today's Date: 11/22/2015   History of Present Illness  50 yo woman with a very complicated past medical history, with SLE, DVTs sp IVC filter, HTN, asthma who came in with lupus flare, and hip pain. She also has history of cardiac stent in 2013, and history of uterine cancer. 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  Clinical Impression  Pt admitted with above diagnosis. Pt currently with functional limitations due to the deficits listed below (see PT Problem List). On eval, pt required min guard assist for bed mobility and min assist transfers. Unable to progress with gait due to RLE pain. Pt will benefit from skilled PT to increase their independence and safety with mobility to allow discharge to the venue listed below.       Follow Up Recommendations SNF    Equipment Recommendations  None recommended by PT    Recommendations for Other Services       Precautions / Restrictions Precautions Precautions: Fall      Mobility  Bed Mobility Overal bed mobility: Needs Assistance Bed Mobility: Supine to Sit     Supine to sit: HOB elevated;Min guard     General bed mobility comments: heavy use of bed rail  Transfers Overall transfer level: Needs assistance Equipment used: Rolling walker (2 wheeled) Transfers: Sit to/from [facility] Sit to Stand: Min assist Stand pivot transfers: Min assist       General transfer comment: verbal cues for hand placement. Pivot transfer toward left.  Ambulation/Gait             General Gait Details: Attempted x 2 without success. Pt unable to progress RLE forward or WB RLE due to pain.   Stairs            Wheelchair Mobility    Modified Rankin (Stroke Patients Only)       Balance                                              Pertinent Vitals/Pain Pain Assessment: 0-10 Pain Score: 6  Pain Location: RLE with weight bearing Pain Descriptors / Indicators: Sharp Pain Intervention(s): Monitored during session;Limited activity within patient's tolerance;Repositioned    Home Living Family/patient expects to be discharged to:: Unsure Living Arrangements: Parent               Additional Comments: Pt had been living in an apt with her mother for the past 3 week. She reports being unable to return.    Prior Function Level of Independence: Independent with assistive device(s)         Comments: Ambulated 10-15 feet max with RW. W/C used for primary mobility. Pt independent with ADLs and transfers.     Hand Dominance        Extremity/Trunk Assessment   Upper Extremity Assessment: Overall WFL for tasks assessed           Lower Extremity Assessment: RLE deficits/detail;Generalized weakness         Communication   Communication: No difficulties  Cognition Arousal/Alertness: Awake/alert Behavior During Therapy: WFL for tasks assessed/performed Overall Cognitive Status: Within Functional Limits for tasks assessed  General Comments      Exercises        Assessment/Plan    PT Assessment Patient needs continued PT services  PT Diagnosis Difficulty walking;Acute pain   PT Problem List Decreased strength;Decreased activity tolerance;Decreased balance;Decreased mobility;Pain  PT Treatment Interventions DME instruction;Gait training;Functional mobility training;Therapeutic activities;Therapeutic exercise;Wheelchair mobility training;Patient/family education;Balance training   PT Goals (Current goals can be found in the Care Plan section) Acute Rehab PT Goals Patient Stated Goal: to walk again PT Goal Formulation: With patient Time For Goal Achievement: 12/06/15 Potential to Achieve Goals: Good     Frequency Min 3X/week   Barriers to discharge Decreased caregiver support      Co-evaluation               End of Session Equipment Utilized During Treatment: Gait belt Activity Tolerance: Patient limited by pain Patient left: in chair;with call bell/phone within reach;with chair alarm set Nurse Communication: Mobility status    Functional Assessment Tool Used: clinical judgement Functional Limitation: Mobility: Walking and moving around Mobility: Walking and Moving Around Current Status [phone]): At least 20 percent but less than 40 percent impaired, limited or restricted Mobility: Walking and Moving Around Goal Status [phone]): At least 1 percent but less than 20 percent impaired, limited or restricted    Time: [id number] PT Time Calculation (min) (ACUTE ONLY): 23 min   Charges:   PT Evaluation $PT Eval Moderate Complexity: 1 Procedure PT Treatments $Therapeutic Activity: 8-22 mins   PT G Codes:   PT G-Codes **NOT FOR INPATIENT CLASS** Functional Assessment Tool Used: clinical judgement Functional Limitation: Mobility: Walking and moving around Mobility: Walking and Moving Around Current Status [id number]): At least 20 percent but less than 40 percent impaired, limited or restricted Mobility: Walking and Moving Around Goal Status [phone]): At least 1 percent but less than 20 percent impaired, limited or restricted    Lorriane Shire 11/22/2015, 2:08 PM

## 2015-11-23 ENCOUNTER — Inpatient Hospital Stay (HOSPITAL_COMMUNITY): Payer: Medicare Other

## 2015-11-23 DIAGNOSIS — R32 Unspecified urinary incontinence: Secondary | ICD-10-CM

## 2015-11-23 LAB — URINALYSIS, ROUTINE W REFLEX MICROSCOPIC
BILIRUBIN URINE: NEGATIVE
Glucose, UA: >1000 mg/dL — AB
Hgb urine dipstick: NEGATIVE
Ketones, ur: NEGATIVE mg/dL
Leukocytes, UA: NEGATIVE
NITRITE: NEGATIVE
PROTEIN: NEGATIVE mg/dL
SPECIFIC GRAVITY, URINE: 1.018 (ref 1.005–1.030)
pH: 7 (ref 5.0–8.0)

## 2015-11-23 LAB — GRAM STAIN

## 2015-11-23 LAB — CBC
HCT: 25.7 % — ABNORMAL LOW (ref 36.0–46.0)
HEMOGLOBIN: 8.5 g/dL — AB (ref 12.0–15.0)
MCH: 25.1 pg — ABNORMAL LOW (ref 26.0–34.0)
MCHC: 33.1 g/dL (ref 30.0–36.0)
MCV: 75.8 fL — ABNORMAL LOW (ref 78.0–100.0)
PLATELETS: 384 10*3/uL (ref 150–400)
RBC: 3.39 MIL/uL — AB (ref 3.87–5.11)
RDW: 18.2 % — ABNORMAL HIGH (ref 11.5–15.5)
WBC: 16.4 10*3/uL — ABNORMAL HIGH (ref 4.0–10.5)

## 2015-11-23 LAB — HEMOGLOBIN A1C
HEMOGLOBIN A1C: 6.7 % — AB (ref 4.8–5.6)
MEAN PLASMA GLUCOSE: 146 mg/dL

## 2015-11-23 LAB — URINE MICROSCOPIC-ADD ON: RBC / HPF: NONE SEEN RBC/hpf (ref 0–5)

## 2015-11-23 LAB — SYNOVIAL CELL COUNT + DIFF, W/ CRYSTALS
Crystals, Fluid: NONE SEEN
OTHER CELLS-SYN: UNDETERMINED

## 2015-11-23 MED ORDER — SODIUM CHLORIDE 0.9 % IJ SOLN
INTRAMUSCULAR | Status: AC
  Filled 2015-11-23: qty 10

## 2015-11-23 MED ORDER — VANCOMYCIN HCL 10 G IV SOLR
2000.0000 mg | Freq: Once | INTRAVENOUS | Status: AC
  Administered 2015-11-23: 2000 mg via INTRAVENOUS
  Filled 2015-11-23: qty 2000

## 2015-11-23 MED ORDER — VANCOMYCIN HCL IN DEXTROSE 1-5 GM/200ML-% IV SOLN
1000.0000 mg | Freq: Two times a day (BID) | INTRAVENOUS | Status: DC
  Administered 2015-11-24 – 2015-11-25 (×3): 1000 mg via INTRAVENOUS
  Filled 2015-11-23 (×5): qty 200

## 2015-11-23 MED ORDER — LIDOCAINE HCL (PF) 1 % IJ SOLN
INTRAMUSCULAR | Status: AC
  Administered 2015-11-23: 5 mL via INTRAMUSCULAR
  Filled 2015-11-23: qty 5

## 2015-11-23 NOTE — Evaluation (Addendum)
Occupational Therapy Evaluation Patient Details Name: Katherine Banks MRN: [id number] DOB: [birth year] Today's Date: 11/23/2015    History of Present Illness 50 y.o. woman with a very complicated past medical history, with SLE, DVTs sp IVC filter, lupus, cardiac stent, HTN, asthma who came in with lupus flare, and hip pain. She also has history of cardiac stent in 2013, and history of uterine cancer. 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   Clinical Impression   Pt admitted with above. Pt independent with ADLs, PTA. Feel pt will benefit from acute OT to increase independence prior to d/c. Plan is to go to SNF.    Follow Up Recommendations  SNF    Equipment Recommendations  Other (comment) (defer to next venue)    Recommendations for Other Services       Precautions / Restrictions Precautions Precautions: Fall Restrictions Weight Bearing Restrictions: No      Mobility Bed Mobility Overal bed mobility: Needs Assistance Bed Mobility: Supine to Sit;Sit to Supine     Supine to sit: Mod assist Sit to supine: Min assist   General bed mobility comments: assisted with bed pad and assisted with holding RLE when coming to EOB. Assist with RLE when returning to bed. Pt able to move in bed without physical assist needed, for OT to assist with bed pan and pt also able to perform hygiene in bed after urinating.  Transfers Overall transfer level: Needs assistance Equipment used: Rolling walker (2 wheeled)             General transfer comment: unable to clear bottom off of bed and stand upright with +1 assist    Balance    no LOB sitting EOB.                                        ADL Overall ADL's : Needs assistance/impaired     Grooming: Set up;Bed level (used sanitizer on hands)               Lower Body Dressing:  Total assistance;Sitting/lateral leans (EOB)     Toilet Transfer Details (indicate cue type and reason): unable to fully clear bottom off of bed with +1 assist Toileting- Clothing Manipulation and Hygiene: Bed level;Moderate assistance Toileting - Clothing Manipulation Details (indicate cue type and reason): pt able to manage wiping after urination; reports difficulty reaching to bottom for clean after BM; assisted with bed pan       General ADL Comments: Educated on what pt could use for toilet aid as she reports difficulty reaching to wipe about BM.     Vision     Perception     Praxis      Pertinent Vitals/Pain Pain Assessment: 0-10 Pain Score: 7  Pain Location: right hip; also reported eyes were burning; right side Pain Descriptors / Indicators: Burning;Other (Comment) (pulling; appeared to be crying) Pain Intervention(s): Monitored during session;Repositioned     Hand Dominance     Extremity/Trunk Assessment Upper Extremity Assessment Upper Extremity Assessment: Generalized weakness (pain with increased shoulder flexion in Rt shoulder)   Lower Extremity Assessment Lower Extremity Assessment: Defer to PT evaluation       Communication Communication Communication: No difficulties   Cognition Arousal/Alertness: Awake/alert Behavior During Therapy: [facility] for tasks assessed/performed  Overall Cognitive Status: Within Functional Limits for tasks assessed                     General Comments       Exercises       Shoulder Instructions      Home Living Family/patient expects to be discharged to:: Skilled nursing facility Living Arrangements: Parent                               Additional Comments: Pt had been living in an apartment with her mother for the past 3 week. She reports she has to be out of there at the end of the month.      Prior Functioning/Environment Level of Independence: Independent with assistive device(s)         Comments: Ambulated 10-15 feet max with RW. W/C used for primary mobility. Pt independent with ADLs and transfers.    OT Diagnosis: Generalized weakness;Acute pain   OT Problem List: Decreased strength;Decreased range of motion;Obesity;Pain;Decreased knowledge of use of DME or AE;Decreased activity tolerance   OT Treatment/Interventions: Self-care/ADL training;Therapeutic exercise;Therapeutic activities;Patient/family education;Balance training;DME and/or AE instruction    OT Goals(Current goals can be found in the care plan section) Acute Rehab OT Goals Patient Stated Goal: get back to normalcy OT Goal Formulation: With patient Time For Goal Achievement: 11/30/15 Potential to Achieve Goals: Good ADL Goals Pt Will Perform Lower Body Dressing: with min assist;with adaptive equipment;sit to/from stand Pt Will Transfer to Toilet: ambulating;with mod assist;stand pivot transfer;bedside commode Pt Will Perform Toileting - Clothing Manipulation and hygiene: with min assist;sit to/from stand;with adaptive equipment Additional ADL Goal #1: Pt will perform bed mobility at min assist level as precursor for ADLs.  OT Frequency: Min 2X/week   Barriers to D/C:            Co-evaluation              End of Session Equipment Utilized During Treatment: Gait belt;Rolling walker  Activity Tolerance: Patient limited by pain Patient left: in bed;with call bell/phone within reach;with bed alarm set   Time: 1358-1420 OT Time Calculation (min): 22 min Charges:  OT General Charges $OT Visit: 1 Procedure OT Evaluation $OT Eval Moderate Complexity: 1 Procedure G-CodesBenito Mccreedy OTR/L [id number] 11/23/2015, 3:02 PM

## 2015-11-23 NOTE — Progress Notes (Signed)
Patient seen and examined. Case d/w residents in detail. I agree with findings and plan as documented in Dr. Sherlynn Carbon note.  Patient with no fevers and mildly improved leukocytosis. She is now s/p aspiration of R hip fluid. Will fu fluid analysis for possible infection. Will hold off on abx for now per ortho. Will discuss results with ortho. PT eval appreciated. Patient for d/c to SNF once discharged. Patient is in agreement with plan.  Doubt active lupus flare. She is much improved on home doses of meds she had run out of. C/w prednisone and plaquenil.
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
## 2015-11-23 NOTE — Progress Notes (Signed)
OT Cancellation Note  Patient Details Name: Katherine Banks MRN: [id number] DOB: [birth year]   Cancelled Treatment:    Reason Eval/Treat Not Completed: Other (comment) (Pt leaving to go to test.)  Benito Mccreedy OTR/L [id number] 11/23/2015, 11:49 AM

## 2015-11-23 NOTE — Progress Notes (Signed)
Subjective:  Patient seen and examined at bedside. No acute events overnight. Pt is eager to have ct guided aspiration done today. Still requiring iv dilaudid. Said she will try to wean off post procedure  Denies f/c/n/v/d/c. Says sometimes she does not feel her urine, and this happens when she gets lupus flares. Says it has improved from yesterday.  Regarding her lupus flares, joint pain is better controlled after receiving prednisone.   We set her an appt at [facility] on Feb 14th and informed that to her      Objective: Vital signs in last 24 hours: Filed Vitals:   11/22/15 1314 11/22/15 2239 11/23/15 0521 11/23/15 0836  BP: 126/69 152/74 117/59 148/67  Pulse: 88 93 73 81  Temp: 98.6 F (37 C) 98.5 F (36.9 C) 97.8 F (36.6 C)   TempSrc: Oral Oral Oral   Resp: 19 16 16    Height:      Weight:      SpO2: 99% 98% 98%    Weight change:   Intake/Output Summary (Last 24 hours) at 11/23/15 1143 Last data filed at 11/23/15 0655  Gross per 24 hour  Intake    300 ml  Output   1200 ml  Net   -900 ml   General: Vital signs reviewed. Patient lying in bed Cardiovascular: Reg rate , Reg rhythm, no murmur appreciated  Pulmonary/Chest: Clear to auscultation bilaterally, no wheezes Abdominal: Soft, obese, nontender to palpation  Hip: Right hip has post surgical incisions, clean dry intact, no drainage or erythema, rechecked on 1/23  Extremities: pulses intact   Lab Results: No results found for this or any previous visit (from the past 24 hour(s)).  Micro Results: Recent Results (from the past 240 hour(s))  Blood culture (routine x 2)     Status: None (Preliminary result)   Collection Time: 11/19/15 10:50 AM  Result Value Ref Range Status   Specimen Description BLOOD RIGHT ANTECUBITAL  Final   Special Requests   Final    BOTTLES DRAWN AEROBIC AND ANAEROBIC 10CC AER 4 CC ANA   Culture NO GROWTH 3 DAYS  Final   Report Status PENDING  Incomplete  Blood  culture (routine x 2)     Status: None (Preliminary result)   Collection Time: 11/19/15 10:55 AM  Result Value Ref Range Status   Specimen Description BLOOD RIGHT HAND  Final   Special Requests BOTTLES DRAWN AEROBIC ONLY 10CC  Final   Culture NO GROWTH 3 DAYS  Final   Report Status PENDING  Incomplete   Studies/Results: No results found. Medications: I have reviewed the patient's current medications. Scheduled Meds: . amLODipine  5 mg Oral Daily  . bacitracin   Topical BID  . budesonide-formoterol  2 puff Inhalation BID  . cloNIDine  0.2 mg Oral BID  . feeding supplement (ENSURE ENLIVE)  237 mL Oral Q24H  . gabapentin  300 mg Oral TID  . heparin  5,000 Units Subcutaneous 3 times per day  . hydroxychloroquine  200 mg Oral BID  . lidocaine (PF)      . montelukast  10 mg Oral Daily  . multivitamin with minerals  1 tablet Oral Daily  . predniSONE  60 mg Oral Q breakfast  . sodium chloride      . zolpidem  10 mg Oral QHS   Continuous Infusions:   PRN Meds:.albuterol, cyclobenzaprine, diclofenac sodium, dicyclomine, HYDROmorphone (DILAUDID) injection, ondansetron, oxyCODONE, sodium chloride, traMADol Assessment/Plan: Active Problems:   Lupus ([place])  Hip pain   Lupus (systemic lupus erythematosus) ([facility])   Arthralgia of right hip  Lupus flare: -pain control with oxycodone 15 mg q6 hours PRN, tramadol 50 mg q4 hours PRN, and dilaudid 0.5 mg IV q6 hours PRN,  Still requiring all of dilaudid. Pt informed that we would like to wean her off from dilaudid and start the oral pain meds and pt amenable to this approach after the procedure  -prednisone 60 mg -plaquenil 200 mg bid  - voltaren gel - gabapentin   Right hip joint pain in the setting of multiple previous joint infections and surgeries (23 in total) s/p removal of right hip arthroplasty:  MRI + fluid collection, and an abscess could not be ruled out. Pt is on chronic prednisone, Tmax 98.6 F overnight, WC not checked today but  it has been around 18 since admission.  Blood cultures NGTD. Will continue to hold off on abx at this time until IR guided aspiration is done tomorrow.   - IR guided aspiration of abscess scheduled for today- culture, cell count, and anaerobic culture  - will re-consult orthopaedics.  - pain control with oxycodone and tramadol and dilaudid PRN  - PT OT eval ordered- recommend SNF--> consulted CSW  - can consider removing rt chest port if she does not require abx if fluid is neg for infxn  Urinary incontinence-- pt said her urinary incontinence improved on its own today but still having it sometimes. UA on admission was negative for infection  - repeated  UA to ensure there is no infection - hemoglobin a1c ordered   Breast mass-- will need f/u mammogram as outpatient  HTN: Pt 's bp has been 117/59 to 152/74 -clonidine  Asthma: Continue albuterol, symbicort, and singulair   FEN-- reg diet, NPO after breakfast on 1/23 (per IR)  for rt hip aspiration .   DVT ppx-- heparin (IR will hold heparin prior to procedure)   Dispo:  To SNF    The patient does not have a current PCP (No Pcp Per Patient) and does need an [facility] hospital follow-up appointment after discharge.  The patient does not have transportation limitations that hinder transportation to clinic appointments.  .Services Needed at time of discharge: Y = Yes, Blank = No PT:   OT:   RN:   Equipment:   Other:     LOS: 1 day   [name], MD 11/23/2015, 11:43 AM

## 2015-11-23 NOTE — NC FL2 (Signed)
[place] LEVEL OF CARE SCREENING TOOL     IDENTIFICATION  Patient Name: Katherine Banks Birthdate: [birth year] Sex: female Admission Date (Current Location): 11/19/2015  [facility] and [place] Number:  Herbalist and Address:  [facility] [place] [place] [address], [place], [place] [address]      Provider Number: [id number]  Attending Physician Name and Address:  Aldine Contes, MD  Relative Name and Phone Number:  Brayton Layman [phone]    Current Level of Care: Hospital Recommended Level of Care: [place] Prior Approval Number:    Date Approved/Denied:   PASRR Number: [id number]  Discharge Plan: SNF    Current Diagnoses: Patient Active Problem List   Diagnosis Date Noted  . Arthralgia of right hip   . Hip pain   . Lupus (systemic lupus erythematosus) ([place])   . Lupus ([place]) 11/19/2015    Orientation RESPIRATION BLADDER Height & Weight    Self, Time, Situation, Place  Normal Incontinent 5\' 6"  (167.6 cm) 250 lbs.  BEHAVIORAL SYMPTOMS/MOOD NEUROLOGICAL BOWEL NUTRITION STATUS   (N/A)   Continent  (Please see DC summary)  AMBULATORY STATUS COMMUNICATION OF NEEDS Skin   Extensive Assist Verbally Normal                       Personal Care Assistance Level of Assistance  Bathing, Feeding, Dressing Bathing Assistance: Limited assistance Feeding assistance: Independent Dressing Assistance: Limited assistance     Functional Limitations Info  Sight Sight Info: Impaired        SPECIAL CARE FACTORS FREQUENCY  PT (By licensed PT)     PT Frequency: min 3x/week              Contractures      Additional Factors Info  Code Status, Allergies Code Status Info: Full Allergies Info: Compazine, Morphine And Related, Phenergan, Toradol           Current Medications (11/23/2015):  This is the current hospital active medication list Current Facility-Administered Medications  Medication Dose Route  Frequency Provider Last Rate Last Dose  . albuterol (PROVENTIL) (2.5 MG/3ML) 0.083% nebulizer solution 2.5 mg  2.5 mg Nebulization Q6H PRN Nischal Narendra, MD      . amLODipine (NORVASC) tablet 5 mg  5 mg Oral Daily Norman Herrlich, MD   5 mg at 11/23/15 0917  . bacitracin ointment   Topical BID Norman Herrlich, MD      . budesonide-formoterol [facility]) 80-4.5 MCG/ACT inhaler 2 puff  2 puff Inhalation BID Norman Herrlich, MD   2 puff at 11/20/15 [phone]  . cloNIDine (CATAPRES) tablet 0.2 mg  0.2 mg Oral BID Norman Herrlich, MD   0.2 mg at 11/23/15 0916  . cyclobenzaprine (FLEXERIL) tablet 10 mg  10 mg Oral Q8H PRN Norman Herrlich, MD      . diclofenac sodium (VOLTAREN) 1 % transdermal gel 2 g  2 g Topical QID PRN Norman Herrlich, MD      . dicyclomine (BENTYL) tablet 20 mg  20 mg Oral BID PRN Norman Herrlich, MD      . feeding supplement (ENSURE ENLIVE) (ENSURE ENLIVE) liquid 237 mL  237 mL Oral Q24H Domenick Bookbinder, RD   Stopped at 11/21/15 1000  . gabapentin (NEURONTIN) capsule 300 mg  300 mg Oral TID Norman Herrlich, MD   300 mg at 11/23/15 0916  . heparin injection 5,000 Units  5,000 Units Subcutaneous 3 times  per day Norman Herrlich, MD   5,000 Units at 11/23/15 1321  . HYDROmorphone (DILAUDID) injection 0.5 mg  0.5 mg Intravenous Q4H PRN Norman Herrlich, MD   0.5 mg at 11/23/15 1321  . hydroxychloroquine (PLAQUENIL) tablet 200 mg  200 mg Oral BID Norman Herrlich, MD   200 mg at 11/23/15 0916  . montelukast (SINGULAIR) tablet 10 mg  10 mg Oral Daily Norman Herrlich, MD   10 mg at 11/23/15 [id number]  . multivitamin with minerals tablet 1 tablet  1 tablet Oral Daily Norman Herrlich, MD   1 tablet at 11/19/15 1825  . ondansetron (ZOFRAN-ODT) disintegrating tablet 4 mg  4 mg Oral Q8H PRN Norman Herrlich, MD   4 mg at 11/21/15 0539  . oxyCODONE (Oxy IR/ROXICODONE) immediate release tablet 15 mg  15 mg Oral Q6H PRN Norman Herrlich, MD   15 mg at 11/21/15 0537  . predniSONE (DELTASONE) tablet 60 mg  60 mg Oral Q  breakfast Norman Herrlich, MD   60 mg at 11/23/15 0916  . sodium chloride 0.9 % injection 10-40 mL  10-40 mL Intracatheter PRN Nischal Narendra, MD      . sodium chloride 0.9 % injection           . traMADol (ULTRAM) tablet 50 mg  50 mg Oral Q4H PRN Norman Herrlich, MD   50 mg at 11/23/15 [phone]  . zolpidem (AMBIEN) tablet 10 mg  10 mg Oral QHS Norman Herrlich, MD   10 mg at 11/22/15 2112     Discharge Medications: Please see discharge summary for a list of discharge medications.  Relevant Imaging Results:  Relevant Lab Results:   Additional Information SSN: [place] Hope [place], [place]

## 2015-11-23 NOTE — Progress Notes (Signed)
Patient ID: Katherine Banks, female   DOB: Feb 14, 1966, 50 y.o.   MRN: [id number] Given the right hip aspiration showing gram positive cocci, will plan on a surgical I&D in the OR tomorrow evening 1/24.  This is unfortunately a chronic process and all we can do is just wash out the fluid and hope for the best.  Will likely need at least 6 weeks of IV antibiotics once a final specimen and sensitivities obtained.  I spoke to her in length about this and she understands the recommendation for surgery.  Clinical, she does appear to be getting better.

## 2015-11-24 ENCOUNTER — Inpatient Hospital Stay (HOSPITAL_COMMUNITY): Payer: Medicare Other | Admitting: Anesthesiology

## 2015-11-24 ENCOUNTER — Encounter (HOSPITAL_COMMUNITY)
Admission: EM | Disposition: A | Discharge status: Skilled Nursing Facility | Payer: Self-pay | Source: Home / Self Care | Attending: Internal Medicine

## 2015-11-24 HISTORY — PX: I & D EXTREMITY: SHX5045

## 2015-11-24 LAB — CULTURE, BLOOD (ROUTINE X 2)
Culture: NO GROWTH
Culture: NO GROWTH

## 2015-11-24 LAB — BASIC METABOLIC PANEL
ANION GAP: 11 (ref 5–15)
BUN: 12 mg/dL (ref 6–20)
CHLORIDE: 107 mmol/L (ref 101–111)
CO2: 26 mmol/L (ref 22–32)
Calcium: 8.8 mg/dL — ABNORMAL LOW (ref 8.9–10.3)
Creatinine, Ser: 0.73 mg/dL (ref 0.44–1.00)
GFR calc Af Amer: >60 mL/min (ref >60)
GFR calc non Af Amer: >60 mL/min (ref >60)
GLUCOSE: 102 mg/dL — AB (ref 65–99)
POTASSIUM: 3.4 mmol/L — AB (ref 3.5–5.1)
Sodium: 144 mmol/L (ref 135–145)

## 2015-11-24 LAB — CBC
HEMATOCRIT: 27.5 % — AB (ref 36.0–46.0)
HEMOGLOBIN: 8.9 g/dL — AB (ref 12.0–15.0)
MCH: 24.6 pg — ABNORMAL LOW (ref 26.0–34.0)
MCHC: 32.4 g/dL (ref 30.0–36.0)
MCV: 76 fL — AB (ref 78.0–100.0)
Platelets: 396 10*3/uL (ref 150–400)
RBC: 3.62 MIL/uL — ABNORMAL LOW (ref 3.87–5.11)
RDW: 18.1 % — AB (ref 11.5–15.5)
WBC: 16.7 10*3/uL — AB (ref 4.0–10.5)

## 2015-11-24 LAB — SURGICAL PCR SCREEN
MRSA, PCR: POSITIVE — AB
Staphylococcus aureus: POSITIVE — AB

## 2015-11-24 SURGERY — IRRIGATION AND DEBRIDEMENT EXTREMITY
Anesthesia: General | Laterality: Right

## 2015-11-24 MED ORDER — PHENYLEPHRINE HCL 10 MG/ML IJ SOLN
INTRAMUSCULAR | Status: DC | PRN
  Administered 2015-11-24 (×2): 80 ug via INTRAVENOUS

## 2015-11-24 MED ORDER — POTASSIUM CHLORIDE 20 MEQ PO PACK: 20.0000 meq | PACK | Freq: Once | ORAL | Status: DC

## 2015-11-24 MED ORDER — SUFENTANIL CITRATE 50 MCG/ML IV SOLN
INTRAVENOUS | Status: AC
  Filled 2015-11-24: qty 1

## 2015-11-24 MED ORDER — OXYCODONE HCL 5 MG/5ML PO SOLN: 5.0000 mg | Freq: Once | ORAL | Status: AC | PRN

## 2015-11-24 MED ORDER — ONDANSETRON HCL 4 MG/2ML IJ SOLN
INTRAMUSCULAR | Status: DC | PRN
  Administered 2015-11-24: 4 mg via INTRAVENOUS

## 2015-11-24 MED ORDER — LIDOCAINE HCL (CARDIAC) 20 MG/ML IV SOLN
INTRAVENOUS | Status: DC | PRN
  Administered 2015-11-24: 60 mg via INTRAVENOUS

## 2015-11-24 MED ORDER — MIDAZOLAM HCL 5 MG/5ML IJ SOLN
INTRAMUSCULAR | Status: DC | PRN
  Administered 2015-11-24: 2 mg via INTRAVENOUS

## 2015-11-24 MED ORDER — LACTATED RINGERS IV SOLN
INTRAVENOUS | Status: DC | PRN
  Administered 2015-11-24: 20:00:00 via INTRAVENOUS

## 2015-11-24 MED ORDER — ONDANSETRON HCL 4 MG/2ML IJ SOLN
4.0000 mg | Freq: Once | INTRAMUSCULAR | Status: AC | PRN
  Administered 2015-11-24: 4 mg via INTRAVENOUS

## 2015-11-24 MED ORDER — HYDROMORPHONE HCL 1 MG/ML IJ SOLN
0.2500 mg | INTRAMUSCULAR | Status: DC | PRN
  Administered 2015-11-24 (×3): 0.5 mg via INTRAVENOUS

## 2015-11-24 MED ORDER — SODIUM CHLORIDE 0.9 % IR SOLN
Status: DC | PRN
Start: 2015-11-24 — End: 2015-11-24
  Administered 2015-11-24: 3000 mL

## 2015-11-24 MED ORDER — SUCCINYLCHOLINE CHLORIDE 20 MG/ML IJ SOLN
INTRAMUSCULAR | Status: AC
  Filled 2015-11-24: qty 1

## 2015-11-24 MED ORDER — HYDROMORPHONE HCL 1 MG/ML IJ SOLN
INTRAMUSCULAR | Status: AC
  Filled 2015-11-24: qty 1

## 2015-11-24 MED ORDER — OXYCODONE HCL 5 MG PO TABS
5.0000 mg | ORAL_TABLET | Freq: Once | ORAL | Status: AC | PRN
  Administered 2015-11-24: 5 mg via ORAL

## 2015-11-24 MED ORDER — ONDANSETRON HCL 4 MG/2ML IJ SOLN
INTRAMUSCULAR | Status: AC
  Filled 2015-11-24: qty 2

## 2015-11-24 MED ORDER — SODIUM CHLORIDE 0.9 % IJ SOLN
INTRAMUSCULAR | Status: AC
  Filled 2015-11-24: qty 10

## 2015-11-24 MED ORDER — PHENYLEPHRINE 40 MCG/ML (10ML) SYRINGE FOR IV PUSH (FOR BLOOD PRESSURE SUPPORT)
PREFILLED_SYRINGE | INTRAVENOUS | Status: AC
  Filled 2015-11-24: qty 10

## 2015-11-24 MED ORDER — MIDAZOLAM HCL 2 MG/2ML IJ SOLN
INTRAMUSCULAR | Status: AC
  Filled 2015-11-24: qty 2

## 2015-11-24 MED ORDER — SUFENTANIL CITRATE 50 MCG/ML IV SOLN
INTRAVENOUS | Status: DC | PRN
  Administered 2015-11-24: 5 ug via INTRAVENOUS
  Administered 2015-11-24: 12.5 ug via INTRAVENOUS
  Administered 2015-11-24 (×2): 10 ug via INTRAVENOUS
  Administered 2015-11-24: 2.5 ug via INTRAVENOUS

## 2015-11-24 MED ORDER — OXYCODONE HCL 5 MG PO TABS
ORAL_TABLET | ORAL | Status: AC
  Filled 2015-11-24: qty 1

## 2015-11-24 MED ORDER — POTASSIUM CHLORIDE CRYS ER 20 MEQ PO TBCR
40.0000 meq | EXTENDED_RELEASE_TABLET | Freq: Once | ORAL | Status: AC
  Administered 2015-11-24: 40 meq via ORAL
  Filled 2015-11-24: qty 2

## 2015-11-24 MED ORDER — LIDOCAINE HCL (CARDIAC) 20 MG/ML IV SOLN
INTRAVENOUS | Status: AC
  Filled 2015-11-24: qty 5

## 2015-11-24 MED ORDER — PROPOFOL 10 MG/ML IV BOLUS
INTRAVENOUS | Status: AC
  Filled 2015-11-24: qty 20

## 2015-11-24 MED ORDER — PROPOFOL 10 MG/ML IV BOLUS
INTRAVENOUS | Status: DC | PRN
  Administered 2015-11-24: 150 mg via INTRAVENOUS

## 2015-11-24 SURGICAL SUPPLY — 63 items
BANDAGE ELASTIC 3 VELCRO ST LF (GAUZE/BANDAGES/DRESSINGS) IMPLANT
BLADE SURG 10 STRL SS (BLADE) ×2 IMPLANT
BNDG COHESIVE 1X5 TAN STRL LF (GAUZE/BANDAGES/DRESSINGS) IMPLANT
BNDG COHESIVE 4X5 TAN STRL (GAUZE/BANDAGES/DRESSINGS) ×2 IMPLANT
BNDG COHESIVE 6X5 TAN STRL LF (GAUZE/BANDAGES/DRESSINGS) ×4 IMPLANT
BNDG CONFORM 3 STRL LF (GAUZE/BANDAGES/DRESSINGS) IMPLANT
BNDG GAUZE STRTCH 6 (GAUZE/BANDAGES/DRESSINGS) ×6 IMPLANT
COVER SURGICAL LIGHT HANDLE (MISCELLANEOUS) ×2 IMPLANT
DRAPE ORTHO SPLIT 77X108 STRL (DRAPES) ×2
DRAPE SURG 17X23 STRL (DRAPES) IMPLANT
DRAPE SURG ORHT 6 SPLT 77X108 (DRAPES) ×2 IMPLANT
DRAPE U-SHAPE 47X51 STRL (DRAPES) ×2 IMPLANT
DRESSING AQUACEL AQ EXTRA 4X5 (GAUZE/BANDAGES/DRESSINGS) IMPLANT
DRSG MEPILEX BORDER 4X4 (GAUZE/BANDAGES/DRESSINGS) ×2 IMPLANT
DURAPREP 26ML APPLICATOR (WOUND CARE) ×2 IMPLANT
ELECT BLADE 4.0 EZ CLEAN MEGAD (MISCELLANEOUS) ×2
ELECT CAUTERY BLADE 6.4 (BLADE) IMPLANT
ELECT REM PT RETURN 9FT ADLT (ELECTROSURGICAL)
ELECTRODE BLDE 4.0 EZ CLN MEGD (MISCELLANEOUS) ×1 IMPLANT
ELECTRODE REM PT RTRN 9FT ADLT (ELECTROSURGICAL) IMPLANT
FACESHIELD STD STERILE (MASK) ×2 IMPLANT
GAUZE SPONGE 4X4 12PLY STRL (GAUZE/BANDAGES/DRESSINGS) ×2 IMPLANT
GAUZE XEROFORM 1X8 LF (GAUZE/BANDAGES/DRESSINGS) ×2 IMPLANT
GAUZE XEROFORM 5X9 LF (GAUZE/BANDAGES/DRESSINGS) ×2 IMPLANT
GLOVE BIO SURGEON STRL SZ8 (GLOVE) ×2 IMPLANT
GLOVE BIOGEL M 7.0 STRL (GLOVE) ×2 IMPLANT
GLOVE BIOGEL PI IND STRL 7.5 (GLOVE) ×1 IMPLANT
GLOVE BIOGEL PI IND STRL 8 (GLOVE) ×3 IMPLANT
GLOVE BIOGEL PI INDICATOR 7.5 (GLOVE) ×1
GLOVE BIOGEL PI INDICATOR 8 (GLOVE) ×3
GLOVE BIOGEL PI ORTHO PRO SZ8 (GLOVE) ×1
GLOVE ORTHO TXT STRL SZ7.5 (GLOVE) ×2 IMPLANT
GLOVE PI ORTHO PRO STRL SZ8 (GLOVE) ×1 IMPLANT
GLOVE SURG SS PI 7.5 STRL IVOR (GLOVE) ×2 IMPLANT
GOWN STRL REUS W/ TWL LRG LVL3 (GOWN DISPOSABLE) ×3 IMPLANT
GOWN STRL REUS W/ TWL XL LVL3 (GOWN DISPOSABLE) IMPLANT
GOWN STRL REUS W/TWL LRG LVL3 (GOWN DISPOSABLE) ×3
GOWN STRL REUS W/TWL XL LVL3 (GOWN DISPOSABLE)
HANDPIECE INTERPULSE COAX TIP (DISPOSABLE) ×1
KIT BASIN OR (CUSTOM PROCEDURE TRAY) ×2 IMPLANT
KIT ROOM TURNOVER OR (KITS) ×2 IMPLANT
MANIFOLD NEPTUNE II (INSTRUMENTS) ×2 IMPLANT
NS IRRIG 1000ML POUR BTL (IV SOLUTION) ×2 IMPLANT
PACK ORTHO EXTREMITY (CUSTOM PROCEDURE TRAY) ×2 IMPLANT
PAD ARMBOARD 7.5X6 YLW CONV (MISCELLANEOUS) ×4 IMPLANT
SET HNDPC FAN SPRY TIP SCT (DISPOSABLE) ×1 IMPLANT
SPONGE LAP 18X18 X RAY DECT (DISPOSABLE) ×2 IMPLANT
STOCKINETTE IMPERVIOUS 9X36 MD (GAUZE/BANDAGES/DRESSINGS) ×4 IMPLANT
SUT ETHILON 2 0 PSLX (SUTURE) ×2 IMPLANT
SUT VIC AB 0 CT1 27 (SUTURE) ×1
SUT VIC AB 0 CT1 27XBRD ANBCTR (SUTURE) ×1 IMPLANT
SUT VIC AB 1 CT1 27 (SUTURE) ×2
SUT VIC AB 1 CT1 27XBRD ANBCTR (SUTURE) ×2 IMPLANT
SUT VIC AB 2-0 CT1 27 (SUTURE) ×1
SUT VIC AB 2-0 CT1 TAPERPNT 27 (SUTURE) ×1 IMPLANT
SYR CONTROL 10ML LL (SYRINGE) IMPLANT
TOWEL OR 17X24 6PK STRL BLUE (TOWEL DISPOSABLE) ×2 IMPLANT
TOWEL OR 17X26 10 PK STRL BLUE (TOWEL DISPOSABLE) ×2 IMPLANT
TRAY FOLEY CATH SILVER 14FR (SET/KITS/TRAYS/PACK) ×2 IMPLANT
TUBE ANAEROBIC SPECIMEN COL (MISCELLANEOUS) ×2 IMPLANT
TUBE CONNECTING 12X1/4 (SUCTIONS) ×2 IMPLANT
WATER STERILE IRR 1000ML POUR (IV SOLUTION) ×2 IMPLANT
YANKAUER SUCT BULB TIP NO VENT (SUCTIONS) ×2 IMPLANT

## 2015-11-24 NOTE — Transfer of Care (Signed)
Immediate Anesthesia Transfer of Care Note  Patient: Katherine Banks  Procedure(s) Performed: Procedure(s): IRRIGATION AND DEBRIDEMENT EXTREMITY/RIGHT HIP (Right)  Patient Location: PACU  Anesthesia Type:General  Level of Consciousness: oriented, patient cooperative and responds to stimulation  Airway & Oxygen Therapy: Patient Spontanous Breathing and Patient connected to nasal cannula oxygen  Post-op Assessment: Report given to RN, Post -op Vital signs reviewed and stable and Patient moving all extremities X 4  Post vital signs: Reviewed and stable  Last Vitals:  Filed Vitals:   11/24/15 0800 11/24/15 1246  BP: 140/72 142/76  Pulse: 78 88  Temp: 36.8 C 36.8 C  Resp: 18 18    Complications: No apparent anesthesia complications

## 2015-11-24 NOTE — Progress Notes (Signed)
Report given Elizabeth Sauer, RN

## 2015-11-24 NOTE — Progress Notes (Signed)
PT Cancellation Note  Patient Details Name: Katherine Banks MRN: [id number] DOB: 04-Aug-1966   Cancelled Treatment:    Reason Eval/Treat Not Completed: Medical issues which prohibited therapy (Pt for surgical I&D this afternoon and pt with rt hip pain.) Will follow up tomorrow.   [name] 11/24/2015, 4:10 PM [place]

## 2015-11-24 NOTE — Op Note (Signed)
NAMEMarland Kitchen  Katherine Banks, Katherine Banks NO.:  [id number]  MEDICAL RECORD NO.:  [id number]  LOCATION:  [facility]                         FACILITY:  [facility]  PHYSICIAN:  Lind Guest. Ninfa Linden, M.D.DATE OF BIRTH:  1965-12-15  DATE OF PROCEDURE:  11/24/2015 DATE OF DISCHARGE:                              OPERATIVE REPORT   PREOPERATIVE DIAGNOSIS:  Chronic infection, right hip with known deep abscess.  POSTOPERATIVE DIAGNOSIS:  Chronic infection, right hip with known deep abscess.  PROCEDURE:  Irrigation and debridement of deep abscess of right hip.  FINDINGS:  Gross purulence and deep soft tissues space at the right hip, status post previous Girdlestone procedure.  SURGEON:  Lind Guest. Ninfa Linden, M.D.  ASSISTING:  Erskine Emery, PA-C.  ANESTHESIA:  General.  COMPLICATIONS:  None.  BLOOD LOSS:  Less than 100 mL.  INDICATIONS:  Katherine Banks is a 50 year old female, who has recently moved from the [place] area to [place].  She has a complicated history involving her right hip and the fact that she had had a previous hip replacement years ago that eventually need revision due to dislocations and eventually got infected.  After what she claims to be 23 operations on that hip, she had received to have a Girdlestone procedure, so she has no hip in place.  She is able to somehow ambulate on that hip and then presented to the emergency room last week with worsening right hip pain, but also just feeling sick with the GI symptoms as well.  A CT scan was obtained of her pelvis and showed chronic changes from previous Girdlestone procedure and certainly, a dead space with questionable fluid.  An aspiration was done yesterday, did show gram-positive cocci, so we recommended an irrigation and debridement through an anterior approach.  The risks and benefits of surgery were explained to her in detail and she did understand the need to proceed.  PROCEDURE DESCRIPTION:  After  informed consent was obtained, appropriate right hip was marked.  She was brought to the operating room, general anesthesia was obtained while she was on her stretcher.  A Foley catheter was placed.  Next, she was placed supine on the Hana fracture table with the perineal post in place and both legs in inline skeletal traction devices, but no traction applied.  Her right operative hip was prepped and draped with DuraPrep and sterile drapes.  Time-out was called and she was identified as correct patient and correct right hip. Of note, there were no wounds on her hip both in her previous incisions and no draining areas.  I went through her previous incision that was done through an anterior approach previously and dissected down to the hip.  We found a dead space and was able to find a pocket of gross purulence.  We used a rongeur to debride necrotic soft tissue, but did not debride any bone.  We then irrigated the dead space with normal saline solution using pulsatile lavage.  We then closed the deep tissue with #1 Vicryl suture followed by 0 Vicryl in the next layer, 2-0 Vicryl in the subcutaneous tissue and interrupted 2-0 nylon on the skin. Xeroform and well-padded sterile dressing were applied.  She was taken  off the Hana table, awakened, extubated and taken to the recovery room in stable condition.  All final counts were correct.  There were no complications noted.  Postoperatively, she will likely to be on long- term IV antibiotics.     Lind Guest. Ninfa Linden, M.D.     CYB/MEDQ  D:  11/24/2015  T:  11/24/2015  Job:  [id number]

## 2015-11-24 NOTE — Progress Notes (Signed)
Patient seen and examined. Case d/w residents in detail. I agree with findings and plan as documented in Dr. Sherlynn Carbon note.  Patient s/p CT guided drainage of her right hip yesterday. Gram stain with abundant gram + cocci and MRSA swab is +ve. 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 Ortho f/u appreciated - likely chronic process and will require 6 weeks of abx. She is scheduled for an I and D today by ortho. Will f/u abscess cx. Blood cx with NGTD. Pt recommending SNF once medically stable for d/c

## 2015-11-24 NOTE — Progress Notes (Signed)
Subjective:  Patient seen and examined at bedside. No acute events overnight. Still having pain and requiring dilaudid   Informed her about the ct guided aspiration results, and that we started iv abx. She was seen by ortho this morning with plans for I&D in the OR in the afternoon.  She said 2 days ago she had urinary incontinence but that has improved. The UA is negative for infection.     Objective: Vital signs in last 24 hours: Filed Vitals:   11/23/15 1552 11/23/15 2109 11/24/15 0440 11/24/15 0800  BP: 128/64 153/71 146/81 140/72  Pulse: 81 90 71 78  Temp: 98.2 F (36.8 C) 98.4 F (36.9 C) 98.2 F (36.8 C) 98.2 F (36.8 C)  TempSrc: Oral Oral Oral Oral  Resp: 16 20 20 18   Height:      Weight:      SpO2: 95% 98% 97% 98%   Weight change:   Intake/Output Summary (Last 24 hours) at 11/24/15 0959 Last data filed at 11/23/15 1900  Gross per 24 hour  Intake   1300 ml  Output    700 ml  Net    600 ml   General: Vital signs reviewed. Patient lying in bed Cardiovascular: Reg rate , Reg rhythm, no murmur appreciated  Pulmonary/Chest: Clear to auscultation bilaterally, no wheezes Abdominal: Soft, obese, nontender to palpation  Extremities: pulses intact   Lab Results: Results for orders placed or performed during the hospital encounter of 11/19/15 (from the past 24 hour(s))  CBC     Status: Abnormal   Collection Time: 11/23/15 11:40 AM  Result Value Ref Range   WBC 16.4 (H) 4.0 - 10.5 K/uL   RBC 3.39 (L) 3.87 - 5.11 MIL/uL   Hemoglobin 8.5 (L) 12.0 - 15.0 g/dL   HCT 25.7 (L) 36.0 - 46.0 %   MCV 75.8 (L) 78.0 - 100.0 fL   MCH 25.1 (L) 26.0 - 34.0 pg   MCHC 33.1 30.0 - 36.0 g/dL   RDW 18.2 (H) 11.5 - 15.5 %   Platelets 384 150 - 400 K/uL  Anaerobic culture     Status: None (Preliminary result)   Collection Time: 11/23/15  1:03 PM  Result Value Ref Range   Specimen Description ABSCESS RIGHT HIP    Special Requests NONE    Gram Stain      ABUNDANT WBC  PRESENT,BOTH PMN AND MONONUCLEAR NO SQUAMOUS EPITHELIAL CELLS SEEN ABUNDANT GRAM POSITIVE COCCI IN PAIRS IN CLUSTERS Performed at [facility] Performed at [facility]    Culture PENDING    Report Status PENDING   Gram stain     Status: None   Collection Time: 11/23/15  1:03 PM  Result Value Ref Range   Specimen Description ABSCESS RIGHT HIP    Special Requests NONE    Gram Stain      ABUNDANT WBC PRESENT, PREDOMINANTLY PMN ABUNDANT GRAM POSITIVE COCCI IN PAIRS IN CLUSTERS    Report Status 11/23/2015 FINAL   Culture, routine-abscess     Status: None (Preliminary result)   Collection Time: 11/23/15  1:03 PM  Result Value Ref Range   Specimen Description ABSCESS RIGHT HIP    Special Requests NONE    Gram Stain      ABUNDANT WBC PRESENT,BOTH PMN AND MONONUCLEAR NO SQUAMOUS EPITHELIAL CELLS SEEN ABUNDANT GRAM POSITIVE COCCI IN PAIRS IN CLUSTERS Performed at [facility] Performed at [facility]    Culture PENDING    Report Status PENDING  Synovial cell count + diff, w/ crystals     Status: Abnormal   Collection Time: 11/23/15  1:03 PM  Result Value Ref Range   Color, Synovial BROWN (A) YELLOW   Appearance-Synovial TURBID (A) CLEAR   Crystals, Fluid NO CRYSTALS SEEN    Other Cells-SYN      UNABLE TO PERFORM DIFFERENTIAL DUE TO CELLULAR DEGENERATION  Urinalysis, Routine w reflex microscopic (not at [facility])     Status: Abnormal   Collection Time: 11/23/15  5:18 PM  Result Value Ref Range   Color, Urine YELLOW YELLOW   APPearance CLEAR CLEAR   Specific Gravity, Urine 1.018 1.005 - 1.030   pH 7.0 5.0 - 8.0   Glucose, UA >1000 (A) NEGATIVE mg/dL   Hgb urine dipstick NEGATIVE NEGATIVE   Bilirubin Urine NEGATIVE NEGATIVE   Ketones, ur NEGATIVE NEGATIVE mg/dL   Protein, ur NEGATIVE NEGATIVE mg/dL   Nitrite NEGATIVE NEGATIVE   Leukocytes, UA NEGATIVE NEGATIVE  Urine microscopic-add on     Status: Abnormal   Collection Time: 11/23/15  5:18 PM    Result Value Ref Range   Squamous Epithelial / LPF TOO NUMEROUS TO COUNT (A) NONE SEEN   WBC, UA 0-5 0 - 5 WBC/hpf   RBC / HPF NONE SEEN 0 - 5 RBC/hpf   Bacteria, UA RARE (A) NONE SEEN   Urine-Other YEAST PRESENT   Surgical pcr screen     Status: Abnormal   Collection Time: 11/24/15  5:00 AM  Result Value Ref Range   MRSA, PCR POSITIVE (A) NEGATIVE   Staphylococcus aureus POSITIVE (A) NEGATIVE    Micro Results: Recent Results (from the past 240 hour(s))  Blood culture (routine x 2)     Status: None (Preliminary result)   Collection Time: 11/19/15 10:50 AM  Result Value Ref Range Status   Specimen Description BLOOD RIGHT ANTECUBITAL  Final   Special Requests   Final    BOTTLES DRAWN AEROBIC AND ANAEROBIC 10CC AER 4 CC ANA   Culture NO GROWTH 4 DAYS  Final   Report Status PENDING  Incomplete  Blood culture (routine x 2)     Status: None (Preliminary result)   Collection Time: 11/19/15 10:55 AM  Result Value Ref Range Status   Specimen Description BLOOD RIGHT HAND  Final   Special Requests BOTTLES DRAWN AEROBIC ONLY 10CC  Final   Culture NO GROWTH 4 DAYS  Final   Report Status PENDING  Incomplete  Anaerobic culture     Status: None (Preliminary result)   Collection Time: 11/23/15  1:03 PM  Result Value Ref Range Status   Specimen Description ABSCESS RIGHT HIP  Final   Special Requests NONE  Final   Gram Stain   Final    ABUNDANT WBC PRESENT,BOTH PMN AND MONONUCLEAR NO SQUAMOUS EPITHELIAL CELLS SEEN ABUNDANT GRAM POSITIVE COCCI IN PAIRS IN CLUSTERS Performed at [facility] Performed at [facility]    Culture PENDING  Incomplete   Report Status PENDING  Incomplete  Gram stain     Status: None   Collection Time: 11/23/15  1:03 PM  Result Value Ref Range Status   Specimen Description ABSCESS RIGHT HIP  Final   Special Requests NONE  Final   Gram Stain   Final    ABUNDANT WBC PRESENT, PREDOMINANTLY PMN ABUNDANT GRAM POSITIVE COCCI IN PAIRS IN CLUSTERS     Report Status 11/23/2015 FINAL  Final  Culture, routine-abscess     Status: None (Preliminary result)  Collection Time: 11/23/15  1:03 PM  Result Value Ref Range Status   Specimen Description ABSCESS RIGHT HIP  Final   Special Requests NONE  Final   Gram Stain   Final    ABUNDANT WBC PRESENT,BOTH PMN AND MONONUCLEAR NO SQUAMOUS EPITHELIAL CELLS SEEN ABUNDANT GRAM POSITIVE COCCI IN PAIRS IN CLUSTERS Performed at [facility] Performed at [facility]    Culture PENDING  Incomplete   Report Status PENDING  Incomplete  Surgical pcr screen     Status: Abnormal   Collection Time: 11/24/15  5:00 AM  Result Value Ref Range Status   MRSA, PCR POSITIVE (A) NEGATIVE Final    Comment: RESULT CALLED TO, READ BACK BY AND VERIFIED WITH: KRUSSELL @0726  11/24/15 MKELLY    Staphylococcus aureus POSITIVE (A) NEGATIVE Final    Comment:        The Xpert SA Assay (FDA approved for NASAL specimens in patients over 38 years of age), is one component of a comprehensive surveillance program.  Test performance has been validated by [facility] for patients greater than or equal to 31 year old. It is not intended to diagnose infection nor to guide or monitor treatment.    Studies/Results: Dg Fluoro Guided Needle Plc Aspiration/injection Loc  11/23/2015  CLINICAL DATA:  50 year old female with history of lupus, right hip replacement which was subsequently removed secondary to infection in December 2015. Extreme pain in the right hip, unable to weightbear. Abnormal rim enhancing fluid collection on MRI. Initial encounter. EXAM: RIGHT HIP INJECTION UNDER FLUOROSCOPY FLUOROSCOPY TIME:  Radiation Exposure Index (as provided by the fluoroscopic device): If the device does not provide the exposure index: Fluoroscopy Time (in minutes and seconds):  0 minutes 30 seconds Number of Acquired Images:  None PROCEDURE: Informed consent was obtained from the patient after explanation of the risks and  benefits. A "time-out" was performed. Surgical absence of the right femoral head and neck re- demonstrated fluoroscopically. An appropriate skin entry site was localized. The right femoral artery was located by palpation. The overlying skin prepped with Betadine, draped in the usual sterile fashion, and infiltrated locally with buffered Lidocaine. A 20 gauge x 3.5 inch spinal needle was advanced to the fluid collection area demonstrated on the recent MRI. After briefly re-directing the needle, yellowish to light brown milky fluid was freely aspirated. In all 85 mL of such fluid was aspirated. The needle was withdrawn, direct pressure held, and hemostasis noted. The patient tolerated the procedure well. A sterile dressing was applied, and she was returned to the ward in stable condition for continued care. Aspirated fluid was sent to the laboratory for analysis. IMPRESSION: Fluoroscopic guided aspiration of the abnormal collection occupying the residual right hip joint. 85 mL of yellowish to light brown milky fluid was obtained and strongly suspected to be purulent. Electronically Signed   By: Genevie Ann M.D.   On: 11/23/2015 13:43   Medications: I have reviewed the patient's current medications. Scheduled Meds: . amLODipine  5 mg Oral Daily  . bacitracin   Topical BID  . budesonide-formoterol  2 puff Inhalation BID  . cloNIDine  0.2 mg Oral BID  . feeding supplement (ENSURE ENLIVE)  237 mL Oral Q24H  . gabapentin  300 mg Oral TID  . heparin  5,000 Units Subcutaneous 3 times per day  . hydroxychloroquine  200 mg Oral BID  . montelukast  10 mg Oral Daily  . multivitamin with minerals  1 tablet Oral Daily  .  predniSONE  60 mg Oral Q breakfast  . vancomycin  1,000 mg Intravenous Q12H  . zolpidem  10 mg Oral QHS   Continuous Infusions:   PRN Meds:.albuterol, cyclobenzaprine, diclofenac sodium, dicyclomine, HYDROmorphone (DILAUDID) injection, ondansetron, oxyCODONE, sodium chloride,  traMADol Assessment/Plan: Active Problems:   Lupus (HCC)   Hip pain   Lupus (systemic lupus erythematosus) (HCC)   Arthralgia of right hip  Right hip joint pain in the setting of multiple previous joint infections and surgeries (23 in total) s/p removal of right hip arthroplasty:  MRI + fluid collection, and an abscess could not be ruled out. Pt is on chronic prednisone, Tmax 98.2 F overnight, WC has been around 18 since admission.  Blood cultures NGTD.  Patient underwent ct guided aspiration of her right hip on 1/23, and the fluid analysis showed turbid and brown fluid. Gram stain showed abundant gram positive cocci in pairs in clusters.  She is MRSA positive.   -f/u culture and sensitivities  -F/U repeat CBC and BMET- still pending -on IV vancomycin per pharm  -consulted orthopedics and pt will have I&D later today -NPO for procedure -contact precautions - pain control with oxycodone and tramadol and dilaudid PRN   - PT OT eval ordered- recommend SNF--> consulted CSW --> may benefit from CIR    Lupus flare: -pain control with oxycodone 15 mg q6 hours PRN, tramadol 50 mg q4 hours PRN, and dilaudid 0.5 mg IV q6 hours PRN,  Still requiring all of dilaudid. She is not using tramadol.  -prednisone 60 mg -plaquenil 200 mg bid  - voltaren gel - gabapentin    Urinary incontinence-- pt said her urinary incontinence improved on its own today but still having it sometimes. UA on admission was negative for infection - repeated  UA was negative.  Unsure of the etiology   HTN: Pt 's bp has been normotensive -clonidine  Asthma: Continue albuterol, symbicort, and singulair   FEN--  NPO today for  I&D  DVT ppx-- heparin (IR will hold heparin prior to procedure)   Dispo:  To SNF    The patient does not have a current PCP (No Pcp Per Patient) and does need an [facility] hospital follow-up appointment after discharge.  The patient does not have transportation limitations that hinder  transportation to clinic appointments.  .Services Needed at time of discharge: Y = Yes, Blank = No PT:   OT:   RN:   Equipment:   Other:     LOS: 2 days   [name], MD 11/24/2015, 9:59 AM

## 2015-11-24 NOTE — Brief Op Note (Signed)
11/19/2015 - 11/24/2015  9:21 PM  PATIENT:  Katherine Banks  50 y.o. female  PRE-OPERATIVE DIAGNOSIS:  Right Hip Infection  POST-OPERATIVE DIAGNOSIS:  Right hip infection  PROCEDURE:  Procedure(s): IRRIGATION AND DEBRIDEMENT EXTREMITY/RIGHT HIP (Right)  SURGEON:  Surgeon(s) and Role:    * Mcarthur Rossetti, MD - Primary  PHYSICIAN ASSISTANT: Benita Stabile, PA-C  ANESTHESIA:   general  EBL:    less than 100 cc  DICTATION: .Other Dictation: Dictation Number [id number]  PLAN OF CARE: Admit to inpatient   PATIENT DISPOSITION:  PACU - hemodynamically stable.   Delay start of Pharmacological VTE agent (>24hrs) due to surgical blood loss or risk of bleeding: no

## 2015-11-24 NOTE — Anesthesia Procedure Notes (Signed)
Procedure Name: LMA Insertion Date/Time: 11/24/2015 8:31 PM Performed by: Claris Che Pre-anesthesia Checklist: Patient identified, Emergency Drugs available, Suction available, Patient being monitored and Timeout performed Patient Re-evaluated:Patient Re-evaluated prior to inductionOxygen Delivery Method: Circle system utilized Preoxygenation: Pre-oxygenation with 100% oxygen Intubation Type: IV induction Ventilation: Mask ventilation without difficulty LMA: LMA inserted LMA Size: 4.0 Number of attempts: 1 Placement Confirmation: positive ETCO2 and breath sounds checked- equal and bilateral Tube secured with: Tape Dental Injury: Teeth and Oropharynx as per pre-operative assessment

## 2015-11-24 NOTE — Anesthesia Preprocedure Evaluation (Addendum)
Anesthesia Evaluation  Patient identified by MRN, date of birth, ID band Patient awake    Reviewed: Allergy & Precautions, NPO status , Patient's Chart, lab work & pertinent test results  History of Anesthesia Complications (+) AWARENESS UNDER ANESTHESIA and history of anesthetic complications  Airway Mallampati: III  TM Distance: >3 FB Neck ROM: Full    Dental  (+) Dental Advisory Given   Pulmonary asthma , former smoker,    breath sounds clear to auscultation       Cardiovascular hypertension, Pt. on medications  Rhythm:Regular Rate:Normal     Neuro/Psych Anxiety    GI/Hepatic Neg liver ROS, GERD  ,  Endo/Other  Morbid obesity  Renal/GU negative Renal ROS     Musculoskeletal  (+) Arthritis ,   Abdominal   Peds  Hematology  (+) anemia ,   Anesthesia Other Findings   Reproductive/Obstetrics                            Lab Results  Component Value Date   WBC 16.7* 11/24/2015   HGB 8.9* 11/24/2015   HCT 27.5* 11/24/2015   MCV 76.0* 11/24/2015   PLT 396 11/24/2015   Lab Results  Component Value Date   CREATININE 0.73 11/24/2015   BUN 12 11/24/2015   NA 144 11/24/2015   K 3.4* 11/24/2015   CL 107 11/24/2015   CO2 26 11/24/2015    Anesthesia Physical Anesthesia Plan  ASA: III  Anesthesia Plan: General   Post-op Pain Management:    Induction: Intravenous  Airway Management Planned: Oral ETT  Additional Equipment:   Intra-op Plan:   Post-operative Plan: Extubation in OR  Informed Consent: I have reviewed the patients History and Physical, chart, labs and discussed the procedure including the risks, benefits and alternatives for the proposed anesthesia with the patient or authorized representative who has indicated his/her understanding and acceptance.   Dental advisory given  Plan Discussed with: CRNA  Anesthesia Plan Comments:         Anesthesia Quick  Evaluation

## 2015-11-24 NOTE — Progress Notes (Signed)
Per Dr. Ola Spurr ok to give PRN dilaudid dose

## 2015-11-25 ENCOUNTER — Encounter (HOSPITAL_COMMUNITY): Payer: Self-pay | Admitting: Orthopaedic Surgery

## 2015-11-25 ENCOUNTER — Ambulatory Visit: Payer: Medicare Other | Admitting: Pulmonary Disease

## 2015-11-25 DIAGNOSIS — B9689 Other specified bacterial agents as the cause of diseases classified elsewhere: Secondary | ICD-10-CM

## 2015-11-25 LAB — CBC
HCT: 24.6 % — ABNORMAL LOW (ref 36.0–46.0)
HEMOGLOBIN: 8.1 g/dL — AB (ref 12.0–15.0)
MCH: 25.2 pg — AB (ref 26.0–34.0)
MCHC: 32.9 g/dL (ref 30.0–36.0)
MCV: 76.6 fL — ABNORMAL LOW (ref 78.0–100.0)
Platelets: 411 10*3/uL — ABNORMAL HIGH (ref 150–400)
RBC: 3.21 MIL/uL — AB (ref 3.87–5.11)
RDW: 18.3 % — ABNORMAL HIGH (ref 11.5–15.5)
WBC: 13.9 10*3/uL — AB (ref 4.0–10.5)

## 2015-11-25 LAB — BASIC METABOLIC PANEL
ANION GAP: 9 (ref 5–15)
BUN: 14 mg/dL (ref 6–20)
CHLORIDE: 102 mmol/L (ref 101–111)
CO2: 25 mmol/L (ref 22–32)
Calcium: 8 mg/dL — ABNORMAL LOW (ref 8.9–10.3)
Creatinine, Ser: 0.96 mg/dL (ref 0.44–1.00)
GFR calc non Af Amer: >60 mL/min (ref >60)
Glucose, Bld: 156 mg/dL — ABNORMAL HIGH (ref 65–99)
POTASSIUM: 3.6 mmol/L (ref 3.5–5.1)
SODIUM: 136 mmol/L (ref 135–145)

## 2015-11-25 MED ORDER — CHLORHEXIDINE GLUCONATE CLOTH 2 % EX PADS
6.0000 | MEDICATED_PAD | Freq: Every day | CUTANEOUS | Status: AC
  Administered 2015-11-25 – 2015-11-29 (×4): 6 via TOPICAL

## 2015-11-25 MED ORDER — SENNOSIDES-DOCUSATE SODIUM 8.6-50 MG PO TABS
2.0000 | ORAL_TABLET | Freq: Two times a day (BID) | ORAL | Status: DC
  Administered 2015-11-25 – 2015-12-01 (×13): 2 via ORAL
  Filled 2015-11-25 (×13): qty 2

## 2015-11-25 MED ORDER — POLYETHYLENE GLYCOL 3350 17 G PO PACK
17.0000 g | PACK | Freq: Every day | ORAL | Status: DC
  Administered 2015-11-25 – 2015-11-30 (×5): 17 g via ORAL
  Filled 2015-11-25 (×7): qty 1

## 2015-11-25 MED ORDER — HYDROMORPHONE HCL 1 MG/ML IJ SOLN
0.5000 mg | INTRAMUSCULAR | Status: DC | PRN
  Administered 2015-11-25 – 2015-11-26 (×17): 0.5 mg via INTRAVENOUS
  Filled 2015-11-25 (×16): qty 1

## 2015-11-25 MED ORDER — VANCOMYCIN HCL IN DEXTROSE 1-5 GM/200ML-% IV SOLN
1000.0000 mg | Freq: Two times a day (BID) | INTRAVENOUS | Status: DC
  Administered 2015-11-25 – 2015-11-26 (×2): 1000 mg via INTRAVENOUS
  Filled 2015-11-25 (×3): qty 200

## 2015-11-25 MED ORDER — MUPIROCIN 2 % EX OINT
1.0000 "application " | TOPICAL_OINTMENT | Freq: Two times a day (BID) | CUTANEOUS | Status: AC
  Administered 2015-11-25 – 2015-11-28 (×6): 1 via NASAL
  Filled 2015-11-25 (×5): qty 22

## 2015-11-25 NOTE — Progress Notes (Signed)
Patient seen and examined. Case d/w residents in detail. I agree with findings and plan as documented in Dr. Sherlynn Carbon note.  Patient is now s/p I and D by ortho with drainage of pus from right hip. Cultures growing staph aureus from CT guided aspiration. Will c/w IV vanco. Will need abx for atleast 6 weeks and given recurrent infections may require chronic suppression therapy. ID consult appreciated. Leukocytosis improving. Will monitor. C/w pain control. Patient will be dc'd to SNF once stable for d/c.

## 2015-11-25 NOTE — Progress Notes (Signed)
Physical Therapy Treatment Patient Details Name: Katherine Banks MRN: [id number] DOB: [birth year] Today's Date: 11/25/2015    History of Present Illness 50 y.o. woman with a very complicated past medical history, with SLE, DVTs sp IVC filter, lupus, cardiac stent, HTN, asthma who came in with lupus flare, and hip pain. She also has history of cardiac stent in 2013, and history of uterine cancer. 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 s/p IRRIGATION AND DEBRIDEMENT EXTREMITY/RIGHT HIP 1/24    PT Comments    Progressing towards goals. Willing to get OOB today and practiced transfer training with min assist and support from a rolling walker. Declines to attempt short distance ambulation at this time. Tolerated exercises well. Pt motivated. Patient will continue to benefit from skilled physical therapy services to further improve independence with functional mobility.   Follow Up Recommendations  SNF     Equipment Recommendations  None recommended by PT    Recommendations for Other Services       Precautions / Restrictions Precautions Precautions: Fall Restrictions Weight Bearing Restrictions: No    Mobility  Bed Mobility Overal bed mobility: Needs Assistance Bed Mobility: Supine to Sit     Supine to sit: Mod assist     General bed mobility comments: Mod assist for pt to scoot to EOB with use of bed pad and for patient to pull through PTs hand. Difficulty scooting with RLE.  Transfers Overall transfer level: Needs assistance Equipment used: Rolling walker (2 wheeled) Transfers: Sit to/from Stand Sit to Stand: Min assist Stand pivot transfers: Min assist       General transfer comment: Min assist for boost from bed and for walker control with pivot to chair. Declines to ambulate. Cues for technique and walker placement for proximity with  transfer.  Ambulation/Gait                 Stairs            Wheelchair Mobility    Modified Rankin (Stroke Patients Only)       Balance                                    Cognition Arousal/Alertness: Awake/alert Behavior During Therapy: WFL for tasks assessed/performed Overall Cognitive Status: Within Functional Limits for tasks assessed                      Exercises General Exercises - Lower Extremity Ankle Circles/Pumps: AROM;Both;10 reps;Seated Long Arc Quad: Strengthening;Both;10 reps;Seated Hip ABduction/ADduction: Strengthening;Both;10 reps;Seated Hip Flexion/Marching: Strengthening;Both;10 reps;Seated    General Comments        Pertinent Vitals/Pain Pain Assessment: Faces Faces Pain Scale: Hurts even more Pain Location: Rt hip Pain Descriptors / Indicators: Constant Pain Intervention(s): Monitored during session;Repositioned    Home Living                      Prior Function            PT Goals (current goals can now be found in the care plan section) Acute Rehab PT Goals Patient Stated Goal: get back to normalcy PT Goal Formulation: With patient Time For Goal Achievement: 12/06/15 Potential to Achieve Goals: Good Progress towards PT goals: Progressing toward goals    Frequency  Min 3X/week    PT Plan Current plan remains appropriate    Co-evaluation             End of Session   Activity Tolerance: Patient limited by pain Patient left: in chair;with call bell/phone within reach;with chair alarm set;with nursing/sitter in room     Time: 1456-1510 PT Time Calculation (min) (ACUTE ONLY): 14 min  Charges:  $Therapeutic Activity: 8-22 mins                    G Codes:      Ellouise Newer 2015-12-10, 3:50 PM  Camille Bal [place], [place]

## 2015-11-25 NOTE — Anesthesia Postprocedure Evaluation (Signed)
Anesthesia Post Note  Patient: Katherine Banks  Procedure(s) Performed: Procedure(s) (LRB): IRRIGATION AND DEBRIDEMENT EXTREMITY/RIGHT HIP (Right)  Patient location during evaluation: PACU Anesthesia Type: General Level of consciousness: awake and alert Pain management: pain level controlled Vital Signs Assessment: post-procedure vital signs reviewed and stable Respiratory status: spontaneous breathing Cardiovascular status: blood pressure returned to baseline Anesthetic complications: no    Last Vitals:  Filed Vitals:   11/24/15 2230 11/24/15 2300  BP: 108/60 112/56  Pulse: 80 75  Temp: 36.4 C 36.5 C  Resp: 15 16    Last Pain:  Filed Vitals:   11/25/15 0213  PainSc: 9                  Tiajuana Amass

## 2015-11-25 NOTE — Progress Notes (Signed)
Patient ID: Katherine Banks, female   DOB: [birth year], 50 y.o.   MRN: [id number] I did find a pocket of infection in her right hip area last evening that was I&D'd.  She will certainly need at least 6 weeks of IV antibiotics as well as chronic suppression.  Likely needs ID consult and SNF placement.  Will follow.  Do not anticipate any further Ortho/surggical intervention during this hospitalization.

## 2015-11-25 NOTE — Progress Notes (Signed)
UR COMPLETED  

## 2015-11-25 NOTE — Care Management Note (Signed)
Case Management Note  Patient Details  Name: Cherolyn Weatherton MRN: [id number] Date of Birth: 1966/10/25  Subjective/Objective:                 Admitted with lupus flare and worsening R hip pain. Recently moved from [place] In Dec[place]2016. Resided with mom pta however can't return to mom's home @ d/c.Hx of SLE, DVTs sp IVC filter, HTN, asthma.   Action/Plan:  S/P I&D of R hip abcess 1/24.....culture growing staph aureus.Marland Kitchen...will need 6wks of iv antibiotics.  SNF....Marland KitchenMarland KitchenCM to f/u with d/c disposition.  Expected Discharge Date:                  Expected Discharge Plan:  [place]  In-House Referral:  Clinical Social Work  Discharge planning Services  CM Consult, Follow-up appt scheduled  Post Acute Care Choice:    Choice offered to:     DME Arranged:    DME Agency:     HH Arranged:    [place] Agency:     Status of Service:  In process, will continue to follow  Medicare Important Message Given:    Date Medicare IM Given:    Medicare IM give by:    Date Additional Medicare IM Given:    Additional Medicare Important Message give by:     If discussed at [place] of Stay Meetings, dates discussed:    Additional Comments:  Sharin Mons, [place] [phone] 11/25/2015, 10:24 PM

## 2015-11-25 NOTE — Progress Notes (Signed)
Patient refusing q2 turns. Patient lying supine in bed and got tearful when asked to turn and place pillow under to prevent pressure injury to skin. Education provided to patient about pressure injury prevention and that she is at a high risk to develop one. Patient still refusing.

## 2015-11-25 NOTE — Progress Notes (Signed)
Subjective:  Patient seen and examined at bedside. She is POD1 s/p i&D of right intra hip abscess  No acute events overnight. Still having pain and requiring dilaudid   Pt understands the rationale behind long term IV antibiotics and going to SNF after her pain is better controlled.      Objective: Vital signs in last 24 hours: Filed Vitals:   11/25/15 0433 11/25/15 0631 11/25/15 0634 11/25/15 0922  BP: 98/50 88/56 100/49 102/55  Pulse: 69 70 70 72  Temp: 97.8 F (36.6 C)     TempSrc: Oral     Resp: 18     Height:      Weight:      SpO2: 100% 100% 100%    Weight change:   Intake/Output Summary (Last 24 hours) at 11/25/15 1309 Last data filed at 11/25/15 0923  Gross per 24 hour  Intake    540 ml  Output    875 ml  Net   -335 ml   General: Vital signs reviewed. Patient lying in bed Cardiovascular: Reg rate , Reg rhythm, no murmur appreciated  Pulmonary/Chest: Clear to auscultation bilaterally, no wheezes Abdominal: Soft, obese, nontender to palpation  Hip: Right hip has a 4x4 on it- no drainage around it, appropriately tender to palpation Extremities: pulses intact   Lab Results: Results for orders placed or performed during the hospital encounter of 11/19/15 (from the past 24 hour(s))  CBC     Status: Abnormal   Collection Time: 11/25/15  4:40 AM  Result Value Ref Range   WBC 13.9 (H) 4.0 - 10.5 K/uL   RBC 3.21 (L) 3.87 - 5.11 MIL/uL   Hemoglobin 8.1 (L) 12.0 - 15.0 g/dL   HCT 24.6 (L) 36.0 - 46.0 %   MCV 76.6 (L) 78.0 - 100.0 fL   MCH 25.2 (L) 26.0 - 34.0 pg   MCHC 32.9 30.0 - 36.0 g/dL   RDW 18.3 (H) 11.5 - 15.5 %   Platelets 411 (H) 150 - 400 K/uL  Basic metabolic panel     Status: Abnormal   Collection Time: 11/25/15  4:40 AM  Result Value Ref Range   Sodium 136 135 - 145 mmol/L   Potassium 3.6 3.5 - 5.1 mmol/L   Chloride 102 101 - 111 mmol/L   CO2 25 22 - 32 mmol/L   Glucose, Bld 156 (H) 65 - 99 mg/dL   BUN 14 6 - 20 mg/dL   Creatinine, Ser  0.96 0.44 - 1.00 mg/dL   Calcium 8.0 (L) 8.9 - 10.3 mg/dL   GFR calc non Af Amer >60 >60 mL/min   GFR calc Af Amer >60 >60 mL/min   Anion gap 9 5 - 15    Micro Results: Recent Results (from the past 240 hour(s))  Blood culture (routine x 2)     Status: None   Collection Time: 11/19/15 10:50 AM  Result Value Ref Range Status   Specimen Description BLOOD RIGHT ANTECUBITAL  Final   Special Requests   Final    BOTTLES DRAWN AEROBIC AND ANAEROBIC 10CC AER 4 CC ANA   Culture NO GROWTH 5 DAYS  Final   Report Status 11/24/2015 FINAL  Final  Blood culture (routine x 2)     Status: None   Collection Time: 11/19/15 10:55 AM  Result Value Ref Range Status   Specimen Description BLOOD RIGHT HAND  Final   Special Requests BOTTLES DRAWN AEROBIC ONLY 10CC  Final   Culture NO GROWTH 5 DAYS  Final   Report Status 11/24/2015 FINAL  Final  Anaerobic culture     Status: None (Preliminary result)   Collection Time: 11/23/15  1:03 PM  Result Value Ref Range Status   Specimen Description ABSCESS RIGHT HIP  Final   Special Requests NONE  Final   Gram Stain   Final    ABUNDANT WBC PRESENT,BOTH PMN AND MONONUCLEAR NO SQUAMOUS EPITHELIAL CELLS SEEN ABUNDANT GRAM POSITIVE COCCI IN PAIRS IN CLUSTERS Performed at [facility] Performed at [facility]    Culture   Final    NO ANAEROBES ISOLATED; CULTURE IN PROGRESS FOR 5 DAYS Performed at [facility]    Report Status PENDING  Incomplete  Gram stain     Status: None   Collection Time: 11/23/15  1:03 PM  Result Value Ref Range Status   Specimen Description ABSCESS RIGHT HIP  Final   Special Requests NONE  Final   Gram Stain   Final    ABUNDANT WBC PRESENT, PREDOMINANTLY PMN ABUNDANT GRAM POSITIVE COCCI IN PAIRS IN CLUSTERS    Report Status 11/23/2015 FINAL  Final  Culture, routine-abscess     Status: None (Preliminary result)   Collection Time: 11/23/15  1:03 PM  Result Value Ref Range Status   Specimen Description  ABSCESS RIGHT HIP  Final   Special Requests NONE  Final   Gram Stain   Final    ABUNDANT WBC PRESENT,BOTH PMN AND MONONUCLEAR NO SQUAMOUS EPITHELIAL CELLS SEEN ABUNDANT GRAM POSITIVE COCCI IN PAIRS IN CLUSTERS Performed at [facility] Performed at [facility]    Culture PENDING  Incomplete   Report Status PENDING  Incomplete  Surgical pcr screen     Status: Abnormal   Collection Time: 11/24/15  5:00 AM  Result Value Ref Range Status   MRSA, PCR POSITIVE (A) NEGATIVE Final    Comment: RESULT CALLED TO, READ BACK BY AND VERIFIED WITH: KRUSSELL @0726  11/24/15 MKELLY    Staphylococcus aureus POSITIVE (A) NEGATIVE Final    Comment:        The Xpert SA Assay (FDA approved for NASAL specimens in patients over 39 years of age), is one component of a comprehensive surveillance program.  Test performance has been validated by [facility] for patients greater than or equal to 72 year old. It is not intended to diagnose infection nor to guide or monitor treatment.    Studies/Results: No results found. Medications: I have reviewed the patient's current medications. Scheduled Meds: . amLODipine  5 mg Oral Daily  . bacitracin   Topical BID  . budesonide-formoterol  2 puff Inhalation BID  . Chlorhexidine Gluconate Cloth  6 each Topical Q0600  . cloNIDine  0.2 mg Oral BID  . feeding supplement (ENSURE ENLIVE)  237 mL Oral Q24H  . gabapentin  300 mg Oral TID  . heparin  5,000 Units Subcutaneous 3 times per day  . hydroxychloroquine  200 mg Oral BID  . montelukast  10 mg Oral Daily  . multivitamin with minerals  1 tablet Oral Daily  . mupirocin ointment  1 application Nasal BID  . polyethylene glycol  17 g Oral Daily  . predniSONE  60 mg Oral Q breakfast  . senna-docusate  2 tablet Oral BID  . vancomycin  1,000 mg Intravenous Q12H  . zolpidem  10 mg Oral QHS   Continuous Infusions:   PRN Meds:.albuterol, cyclobenzaprine, diclofenac sodium, dicyclomine,  HYDROmorphone (DILAUDID) injection, ondansetron, sodium chloride Assessment/Plan: Active Problems:  Lupus (HCC)   Hip pain   Lupus (systemic lupus erythematosus) (HCC)   Arthralgia of right hip  Right hip joint pain in the setting of multiple previous joint infections and surgeries (23 in total) s/p removal of right hip arthroplasty:  MRI + fluid collection, and an abscess could not be ruled out. Pt is on chronic prednisone,  WC has been around 18 since admission.  Blood cultures NGTD.  Patient underwent ct guided aspiration of her right hip on 1/23, and the fluid analysis showed turbid and brown fluid. Gram stain showed abundant gram positive cocci in pairs in clusters.  She is MRSA positive.  She underwent I&d of right hip 11/24/2015.  CBC shows WC of 16-->  13.4 No anaerobic growth in culture, other culture and sensitivities pending   -ortho following and appreciate recs -consulted ID for management of long term antibiotics -consulted CSW regarding SNF -f/u culture and sensitivities  -on IV vancomycin per pharm  -pain control dilaudid 0.5 mg q2 hours PRN, resume tramadol and oxycodone 15 mg daily tomorrow - pt said she wanted to wait till tomorrow to resume oxycodone before decreasing dilaudid which is reasonable given this is POD1.  -contact precautions   Lupus: likely not a lupus flare. Her pain mainly from right hip which was I&Ded on 11/24/2015 -pain control with oxycodone 15 mg q6 hours PRN, tramadol 50 mg q4 hours prn -currently on dilaudid 0.5 mg IV q2 hours PRN,   Will resume above meds tomorrow once her pain is better controlled POD1 -prednisone 60 mg -plaquenil 200 mg bid  - voltaren gel - gabapentin   HTN: Pt 's bp has been normotensive -clonidine  Asthma: Continue albuterol, symbicort, and singulair   FEN--  Diet HH  DVT ppx-- heparin  Dispo:  To SNF    The patient does not have a current PCP (No Pcp Per Patient) and does need an [facility] hospital follow-up  appointment after discharge.  The patient does not have transportation limitations that hinder transportation to clinic appointments.  .Services Needed at time of discharge: Y = Yes, Blank = No PT:   OT:   RN:   Equipment:   Other:     LOS: 3 days   Katherine Estelle, MD 11/25/2015, 1:09 PM
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
## 2015-11-26 DIAGNOSIS — M25551 Pain in right hip: Secondary | ICD-10-CM

## 2015-11-26 DIAGNOSIS — M329 Systemic lupus erythematosus, unspecified: Secondary | ICD-10-CM

## 2015-11-26 DIAGNOSIS — J45909 Unspecified asthma, uncomplicated: Secondary | ICD-10-CM

## 2015-11-26 DIAGNOSIS — Z9889 Other specified postprocedural states: Secondary | ICD-10-CM

## 2015-11-26 DIAGNOSIS — Z7951 Long term (current) use of inhaled steroids: Secondary | ICD-10-CM

## 2015-11-26 DIAGNOSIS — D72829 Elevated white blood cell count, unspecified: Secondary | ICD-10-CM

## 2015-11-26 DIAGNOSIS — Z7952 Long term (current) use of systemic steroids: Secondary | ICD-10-CM

## 2015-11-26 DIAGNOSIS — B9562 Methicillin resistant Staphylococcus aureus infection as the cause of diseases classified elsewhere: Secondary | ICD-10-CM

## 2015-11-26 DIAGNOSIS — L02415 Cutaneous abscess of right lower limb: Principal | ICD-10-CM

## 2015-11-26 DIAGNOSIS — I10 Essential (primary) hypertension: Secondary | ICD-10-CM

## 2015-11-26 LAB — CBC
HEMATOCRIT: 25.8 % — AB (ref 36.0–46.0)
HEMOGLOBIN: 8.4 g/dL — AB (ref 12.0–15.0)
MCH: 24.6 pg — AB (ref 26.0–34.0)
MCHC: 32.6 g/dL (ref 30.0–36.0)
MCV: 75.4 fL — ABNORMAL LOW (ref 78.0–100.0)
Platelets: 413 10*3/uL — ABNORMAL HIGH (ref 150–400)
RBC: 3.42 MIL/uL — ABNORMAL LOW (ref 3.87–5.11)
RDW: 17.7 % — AB (ref 11.5–15.5)
WBC: 18.3 10*3/uL — ABNORMAL HIGH (ref 4.0–10.5)

## 2015-11-26 LAB — BASIC METABOLIC PANEL
Anion gap: 9 (ref 5–15)
BUN: 16 mg/dL (ref 6–20)
CALCIUM: 8.5 mg/dL — AB (ref 8.9–10.3)
CHLORIDE: 106 mmol/L (ref 101–111)
CO2: 24 mmol/L (ref 22–32)
CREATININE: 0.89 mg/dL (ref 0.44–1.00)
GFR calc non Af Amer: >60 mL/min (ref >60)
GLUCOSE: 196 mg/dL — AB (ref 65–99)
Potassium: 3.5 mmol/L (ref 3.5–5.1)
Sodium: 139 mmol/L (ref 135–145)

## 2015-11-26 LAB — CULTURE, ROUTINE-ABSCESS

## 2015-11-26 LAB — VANCOMYCIN, TROUGH: VANCOMYCIN TR: 21 ug/mL — AB (ref 10.0–20.0)

## 2015-11-26 MED ORDER — OXYCODONE HCL 5 MG PO TABS
15.0000 mg | ORAL_TABLET | Freq: Four times a day (QID) | ORAL | Status: DC | PRN
  Administered 2015-11-26 – 2015-11-27 (×3): 15 mg via ORAL
  Filled 2015-11-26 (×4): qty 3

## 2015-11-26 MED ORDER — HYDROMORPHONE HCL 1 MG/ML IJ SOLN
0.5000 mg | INTRAMUSCULAR | Status: DC | PRN
  Administered 2015-11-26 – 2015-11-27 (×5): 0.5 mg via INTRAVENOUS
  Filled 2015-11-26 (×5): qty 1

## 2015-11-26 MED ORDER — TRAMADOL HCL 50 MG PO TABS
50.0000 mg | ORAL_TABLET | Freq: Four times a day (QID) | ORAL | Status: DC
  Administered 2015-11-26 – 2015-11-27 (×4): 50 mg via ORAL
  Filled 2015-11-26 (×4): qty 1

## 2015-11-26 MED ORDER — VANCOMYCIN HCL IN DEXTROSE 750-5 MG/150ML-% IV SOLN
750.0000 mg | Freq: Two times a day (BID) | INTRAVENOUS | Status: DC
  Administered 2015-11-26 – 2015-11-28 (×4): 750 mg via INTRAVENOUS
  Filled 2015-11-26 (×4): qty 150

## 2015-11-26 NOTE — Progress Notes (Signed)
Paged Dr. Tiburcio Pea regarding positive MRSA culture to right hip.

## 2015-11-26 NOTE — Progress Notes (Signed)
Subjective:  Patient seen and examined at bedside. She is POD2 s/p i&D of right intra hip abscess  No acute events overnight. Still having pain and requiring dilaudid. She said she tried to sit in the chair yesterday. We encouraged her to ambulate in the room.   Talked with CSW who said, pt is not eligible for CIR, but she can go to SNF- plan is for SNF tomorrow     Objective: Vital signs in last 24 hours: Filed Vitals:   11/25/15 0922 11/25/15 1340 11/25/15 2109 11/26/15 0638  BP: 102/55 102/64 124/58 121/60  Pulse: 72 75 79 69  Temp:  99 F (37.2 C) 98.5 F (36.9 C) 98.3 F (36.8 C)  TempSrc:  Oral Oral Oral  Resp:  20 20 18   Height:      Weight:      SpO2:  99% 96% 100%   Weight change:   Intake/Output Summary (Last 24 hours) at 11/26/15 1327 Last data filed at 11/25/15 2130  Gross per 24 hour  Intake    462 ml  Output    500 ml  Net    -38 ml   General: Vital signs reviewed. Patient lying in bed Cardiovascular: Reg rate , Reg rhythm, no murmur appreciated  Pulmonary/Chest: Clear to auscultation bilaterally, no wheezes Abdominal: Soft, obese, nontender to palpation  Hip: Right hip has a 4x4 on it- no drainage around it, appropriately tender to palpation Extremities: pulses intact   Lab Results: Results for orders placed or performed during the hospital encounter of 11/19/15 (from the past 24 hour(s))  Vancomycin, trough     Status: Abnormal   Collection Time: 11/26/15  6:00 AM  Result Value Ref Range   Vancomycin Tr 21 (H) 10.0 - 20.0 ug/mL  CBC     Status: Abnormal   Collection Time: 11/26/15  9:30 AM  Result Value Ref Range   WBC 18.3 (H) 4.0 - 10.5 K/uL   RBC 3.42 (L) 3.87 - 5.11 MIL/uL   Hemoglobin 8.4 (L) 12.0 - 15.0 g/dL   HCT 25.8 (L) 36.0 - 46.0 %   MCV 75.4 (L) 78.0 - 100.0 fL   MCH 24.6 (L) 26.0 - 34.0 pg   MCHC 32.6 30.0 - 36.0 g/dL   RDW 17.7 (H) 11.5 - 15.5 %   Platelets 413 (H) 150 - 400 K/uL  Basic metabolic panel     Status:  Abnormal   Collection Time: 11/26/15  9:30 AM  Result Value Ref Range   Sodium 139 135 - 145 mmol/L   Potassium 3.5 3.5 - 5.1 mmol/L   Chloride 106 101 - 111 mmol/L   CO2 24 22 - 32 mmol/L   Glucose, Bld 196 (H) 65 - 99 mg/dL   BUN 16 6 - 20 mg/dL   Creatinine, Ser 0.89 0.44 - 1.00 mg/dL   Calcium 8.5 (L) 8.9 - 10.3 mg/dL   GFR calc non Af Amer >60 >60 mL/min   GFR calc Af Amer >60 >60 mL/min   Anion gap 9 5 - 15    Micro Results: Recent Results (from the past 240 hour(s))  Blood culture (routine x 2)     Status: None   Collection Time: 11/19/15 10:50 AM  Result Value Ref Range Status   Specimen Description BLOOD RIGHT ANTECUBITAL  Final   Special Requests   Final    BOTTLES DRAWN AEROBIC AND ANAEROBIC 10CC AER 4 CC ANA   Culture NO GROWTH 5 DAYS  Final  Report Status 11/24/2015 FINAL  Final  Blood culture (routine x 2)     Status: None   Collection Time: 11/19/15 10:55 AM  Result Value Ref Range Status   Specimen Description BLOOD RIGHT HAND  Final   Special Requests BOTTLES DRAWN AEROBIC ONLY 10CC  Final   Culture NO GROWTH 5 DAYS  Final   Report Status 11/24/2015 FINAL  Final  Anaerobic culture     Status: None (Preliminary result)   Collection Time: 11/23/15  1:03 PM  Result Value Ref Range Status   Specimen Description ABSCESS RIGHT HIP  Final   Special Requests NONE  Final   Gram Stain   Final    ABUNDANT WBC PRESENT,BOTH PMN AND MONONUCLEAR NO SQUAMOUS EPITHELIAL CELLS SEEN ABUNDANT GRAM POSITIVE COCCI IN PAIRS IN CLUSTERS Performed at [facility] Performed at [facility] [facility]    Culture   Final    NO ANAEROBES ISOLATED; CULTURE IN PROGRESS FOR 5 DAYS Performed at [facility]    Report Status PENDING  Incomplete  Gram stain     Status: None   Collection Time: 11/23/15  1:03 PM  Result Value Ref Range Status   Specimen Description ABSCESS RIGHT HIP  Final   Special Requests NONE  Final   Gram Stain   Final    ABUNDANT WBC PRESENT,  PREDOMINANTLY PMN ABUNDANT GRAM POSITIVE COCCI IN PAIRS IN CLUSTERS    Report Status 11/23/2015 FINAL  Final  Culture, routine-abscess     Status: None   Collection Time: 11/23/15  1:03 PM  Result Value Ref Range Status   Specimen Description ABSCESS RIGHT HIP  Final   Special Requests NONE  Final   Gram Stain   Final    ABUNDANT WBC PRESENT,BOTH PMN AND MONONUCLEAR NO SQUAMOUS EPITHELIAL CELLS SEEN ABUNDANT GRAM POSITIVE COCCI IN PAIRS IN CLUSTERS Performed at [facility] Performed at [facility]    Culture   Final    ABUNDANT METHICILLIN RESISTANT STAPHYLOCOCCUS AUREUS Note: RIFAMPIN AND GENTAMICIN SHOULD NOT BE USED AS SINGLE DRUGS FOR TREATMENT OF STAPH INFECTIONS. CRITICAL RESULT CALLED TO, READ BACK BY AND VERIFIED WITH: STEPHANIE ROGERS 11/26/15 0955 BY SMITHERSJ Performed at [facility]    Report Status 11/26/2015 FINAL  Final   Organism ID, Bacteria METHICILLIN RESISTANT STAPHYLOCOCCUS AUREUS  Final      Susceptibility   Methicillin resistant staphylococcus aureus - MIC*    CLINDAMYCIN <=0.25 SENSITIVE Sensitive     ERYTHROMYCIN 0.5 SENSITIVE Sensitive     GENTAMICIN <=0.5 SENSITIVE Sensitive     LEVOFLOXACIN 0.25 SENSITIVE Sensitive     OXACILLIN >=4 RESISTANT Resistant     RIFAMPIN <=0.5 SENSITIVE Sensitive     TRIMETH/SULFA <=10 SENSITIVE Sensitive     VANCOMYCIN 1 SENSITIVE Sensitive     TETRACYCLINE <=1 SENSITIVE Sensitive     * ABUNDANT METHICILLIN RESISTANT STAPHYLOCOCCUS AUREUS  Surgical pcr screen     Status: Abnormal   Collection Time: 11/24/15  5:00 AM  Result Value Ref Range Status   MRSA, PCR POSITIVE (A) NEGATIVE Final    Comment: RESULT CALLED TO, READ BACK BY AND VERIFIED WITH: KRUSSELL @0726  11/24/15 MKELLY    Staphylococcus aureus POSITIVE (A) NEGATIVE Final    Comment:        The Xpert SA Assay (FDA approved for NASAL specimens in patients over 42 years of age), is one component of a comprehensive  surveillance program.  Test performance has been validated by [facility]  for patients greater than or equal to 43 year old. It is not intended to diagnose infection nor to guide or monitor treatment.    Studies/Results: No results found. Medications: I have reviewed the patient's current medications. Scheduled Meds: . amLODipine  5 mg Oral Daily  . bacitracin   Topical BID  . budesonide-formoterol  2 puff Inhalation BID  . Chlorhexidine Gluconate Cloth  6 each Topical Q0600  . cloNIDine  0.2 mg Oral BID  . feeding supplement (ENSURE ENLIVE)  237 mL Oral Q24H  . gabapentin  300 mg Oral TID  . heparin  5,000 Units Subcutaneous 3 times per day  . hydroxychloroquine  200 mg Oral BID  . montelukast  10 mg Oral Daily  . multivitamin with minerals  1 tablet Oral Daily  . mupirocin ointment  1 application Nasal BID  . polyethylene glycol  17 g Oral Daily  . predniSONE  60 mg Oral Q breakfast  . senna-docusate  2 tablet Oral BID  . traMADol  50 mg Oral 4 times per day  . vancomycin  750 mg Intravenous Q12H  . zolpidem  10 mg Oral QHS   Continuous Infusions:   PRN Meds:.albuterol, cyclobenzaprine, diclofenac sodium, dicyclomine, HYDROmorphone (DILAUDID) injection, ondansetron, oxyCODONE, sodium chloride Assessment/Plan: Active Problems:   Lupus (HCC)   Hip pain   Lupus (systemic lupus erythematosus) (HCC)   Arthralgia of right hip  Right hip joint pain in the setting of multiple previous joint infections and surgeries (23 in total) s/p removal of right hip arthroplasty:  MRI + fluid collection, and an abscess could not be ruled out. Pt is on chronic prednisone,  WC has been around 18 since admission.  Blood cultures NGTD.  Patient underwent ct guided aspiration of her right hip on 1/23, and the fluid analysis showed turbid and brown fluid. Gram stain showed abundant gram positive cocci in pairs in clusters.  She is MRSA positive.  She underwent I&d of right hip 11/24/2015.  CBC  shows WC of 16-->  13.4--> 18 Culture growing methicillin resistant Staph aureus, sensitive to both vancomycin and bactrim.  No anaerobic growth.  -ortho following and appreciate recs -consulted ID and following for management of long term antibiotics -consulted CSW regarding SNF -on IV vancomycin per pharm  -pain control dilaudid 0.5 mg q4 hours PRN, resumed tramadol and oxycodone 15 mg daily- we encouraged the pt to use the oxycodone and tramadol first before using dilaudid. Plan is to wean the dilaudid. -contact precautions   Lupus: likely not a lupus flare. Her pain mainly from right hip which was I&Ded on 11/24/2015 -pain control with oxycodone 15 mg q6 hours PRN, tramadol 50 mg q4 hours prn -currently on dilaudid 0.5 mg IV q4 hours PRN,    -prednisone 60 mg -plaquenil 200 mg bid  - voltaren gel - gabapentin   HTN: Pt 's bp has been normotensive -clonidine  Asthma: Continue albuterol, symbicort, and singulair   FEN--  Diet HH  DVT ppx-- heparin  Dispo:  To SNF    The patient does not have a current PCP (No Pcp Per Patient) and does need an [facility] hospital follow-up appointment after discharge.  The patient does not have transportation limitations that hinder transportation to clinic appointments.  .Services Needed at time of discharge: Y = Yes, Blank = No PT:   OT:   RN:   Equipment:   Other:     LOS: 4 days   [name], MD 11/26/2015, 1:27 PM

## 2015-11-26 NOTE — Progress Notes (Signed)
Patient seen and examined. Case d/w residents in detail. I agree with findings and plan as documented in Dr. Sherlynn Carbon note.  Patient is now POD *2 s/p R hip I and D. Cultures from CT guided aspiration growing MRSA. C/w IV vancomycin for now. Ortho f/u appreciated. Leukocytosis mildly worsened today but patient remains afebrile. Will monitor closely.  Will c/w pain control and PT. Likely d/c to SNF in AM.

## 2015-11-26 NOTE — Progress Notes (Signed)
Nutrition Follow-up  DOCUMENTATION CODES:   Morbid obesity  INTERVENTION:   -Continue Ensure Enlive po daily, each supplement provides 350 kcal and 20 grams of protein -Continue snacks TID  NUTRITION DIAGNOSIS:   Inadequate oral intake related to poor appetite as evidenced by per patient/family report.  Progressing  GOAL:   Patient will meet greater than or equal to 90% of their needs  [place]  MONITOR:   PO intake, Supplement acceptance, Labs, Weight trends, Skin, I & O's  REASON FOR ASSESSMENT:   Malnutrition Screening Tool    ASSESSMENT:   THis is a 50 yo woman with a very complicated past medical history, with SLE, DVTs sp IVC filter, HTN, asthma who came in with lupus flare, and hip pain. She also has history of cardiac stent in 2013, and history of uterine cancer  Pt s/p surgical I&D of rt hip on 11/24/15. Pt with rt hip abccess with gram positive cocci. Pt awaiting ID consult; currently on IV antibiotics.   Spoke with pt at bedside. She is minimally conversant secondary to pain. She reports her appetite is improving. Lunch tray at bedside revealed 75% completion. Meal completion variable; PO: 10-100% per doc flowsheets. Pt reports consuming her Ensure supplements and snacks and would like to continue with these.   CSW following. Plan in to d/c to SNF once medically stable.  Labs reviewed.  Diet Order:  Diet regular Room service appropriate?: Yes; Fluid consistency:: Thin  Skin:  Reviewed, no issues  Last BM:  11/25/15  Height:   Ht Readings from Last 1 Encounters:  11/20/15 5\' 6"  (1.676 m)    Weight:   Wt Readings from Last 1 Encounters:  11/20/15 250 lb (113.399 kg)    Ideal Body Weight:  59.1 kg  BMI:  Body mass index is 40.37 kg/(m^2).  Estimated Nutritional Needs:   Kcal:  1700-1900  Protein:  70-85 grams  Fluid:  1.7-1.9 L  EDUCATION NEEDS:   No education needs identified at this time  Garth Diffley A. Jimmye Norman, RD, LDN,  CDE Pager: [phone] After hours Pager: [phone]

## 2015-11-26 NOTE — Progress Notes (Signed)
Patient ID: Katherine Banks, female   DOB: [birth year], 50 y.o.   MRN: [id number] No acute changes.  Right hip incision clean and dry.  No redness.  New dressing applied.  Continue IV antibiotcs.  Mobilize as tolerated.

## 2015-11-26 NOTE — Progress Notes (Signed)
Pharmacy Antibiotic Follow-up Note  Katherine Banks is a 50 y.o. year-old female admitted on 11/19/2015.  The patient is currently on day 4 of vancomycin for osteomyelitis.  Assessment/Plan: The dose of vancomycin will be adjusted to 750mg  IV q12h based on renal function/trough level  Temp (24hrs), Avg:98.6 F (37 C), Min:98.3 F (36.8 C), Max:99 F (37.2 C)   Recent Labs Lab 11/22/15 0645 11/23/15 1140 11/24/15 1225 11/25/15 0440 11/26/15 0930  WBC 19.3* 16.4* 16.7* 13.9* 18.3*    Recent Labs Lab 11/21/15 0428 11/22/15 0645 11/24/15 1225 11/25/15 0440 11/26/15 0930  CREATININE 1.01* 0.83 0.73 0.96 0.89   Estimated Creatinine Clearance: 95.5 mL/min (by C-G formula based on Cr of 0.89).    Allergies  Allergen Reactions  . Compazine [Prochlorperazine Edisylate]   . Morphine And Related   . Phenergan [Promethazine Hcl]   . Toradol [Ketorolac Tromethamine]     Antimicrobials this admission: Vanc 1/23>>  Levels/dose changes this admission: 1/26 VT = 21 on 1g IV q12h- changed to 750mg  IV q12h  Microbiology results: 1/19 Blood: Neg 1/23 R hip abscess: abundant MRSA 1/24 MRSA PCR: pos  Thank you for allowing pharmacy to be a part of this patient's care.  Luciano Cinquemani D. Drema Eddington, PharmD, BCPS Clinical Pharmacist Pager: [phone] 11/26/2015 11:19 AM

## 2015-11-27 DIAGNOSIS — M868X8 Other osteomyelitis, other site: Secondary | ICD-10-CM

## 2015-11-27 DIAGNOSIS — H1031 Unspecified acute conjunctivitis, right eye: Secondary | ICD-10-CM

## 2015-11-27 LAB — CBC
HEMATOCRIT: 25.4 % — AB (ref 36.0–46.0)
HEMOGLOBIN: 8.2 g/dL — AB (ref 12.0–15.0)
MCH: 24.6 pg — ABNORMAL LOW (ref 26.0–34.0)
MCHC: 32.3 g/dL (ref 30.0–36.0)
MCV: 76.3 fL — ABNORMAL LOW (ref 78.0–100.0)
Platelets: 446 10*3/uL — ABNORMAL HIGH (ref 150–400)
RBC: 3.33 MIL/uL — ABNORMAL LOW (ref 3.87–5.11)
RDW: 18 % — AB (ref 11.5–15.5)
WBC: 19.6 10*3/uL — AB (ref 4.0–10.5)

## 2015-11-27 LAB — BASIC METABOLIC PANEL
ANION GAP: 8 (ref 5–15)
BUN: 16 mg/dL (ref 6–20)
CALCIUM: 8.5 mg/dL — AB (ref 8.9–10.3)
CO2: 26 mmol/L (ref 22–32)
CREATININE: 0.87 mg/dL (ref 0.44–1.00)
Chloride: 107 mmol/L (ref 101–111)
Glucose, Bld: 179 mg/dL — ABNORMAL HIGH (ref 65–99)
Potassium: 3.5 mmol/L (ref 3.5–5.1)
SODIUM: 141 mmol/L (ref 135–145)

## 2015-11-27 MED ORDER — HYDROMORPHONE HCL 1 MG/ML IJ SOLN
0.5000 mg | INTRAMUSCULAR | Status: DC | PRN
  Administered 2015-11-27 – 2015-11-28 (×8): 0.5 mg via INTRAVENOUS
  Filled 2015-11-27 (×8): qty 1

## 2015-11-27 MED ORDER — ERYTHROMYCIN 5 MG/GM OP OINT
TOPICAL_OINTMENT | Freq: Three times a day (TID) | OPHTHALMIC | Status: DC
  Administered 2015-11-27 (×2): via OPHTHALMIC
  Filled 2015-11-27: qty 3.5

## 2015-11-27 NOTE — Progress Notes (Signed)
UR COMPLETED  

## 2015-11-27 NOTE — Progress Notes (Signed)
Subjective:  Patient seen and examined at bedside. She is POD3 s/p i&D of right intra hip abscess. Pt said that she was having more pain and was not able to sleep overnight. Her pain was 8/10 and she said 'something is not right' and that her pain has now localized to her right groin area.   She was also complaining of some left eye discharge.  She required her dilaudid and today given her level of pain, we increased it to q2 prn.      Objective: Vital signs in last 24 hours: Filed Vitals:   11/26/15 1451 11/26/15 2250 11/27/15 0632 11/27/15 0847  BP: 126/67 148/59 123/68 154/88  Pulse: 72  60   Temp: 98.3 F (36.8 C)  98.2 F (36.8 C)   TempSrc: Oral  Oral   Resp: 20  18   Height:      Weight:      SpO2: 100%  100%    Weight change:   Intake/Output Summary (Last 24 hours) at 11/27/15 1231 Last data filed at 11/27/15 1059  Gross per 24 hour  Intake      0 ml  Output   2400 ml  Net  -2400 ml   General: Vital signs reviewed. Patient lying in bed, has a foley with yellow urine.  HEENT: right eye has some redness, no purulent drainage noted but pt said there was some earlier  Cardiovascular: Reg rate , Reg rhythm, no murmur appreciated  Pulmonary/Chest: Clear to auscultation bilaterally, no wheezes Abdominal: Soft, obese, nontender to palpation  Hip: Right hip has a 4x4 on it- no drainage around it. After opening the bandage, the sutures are c/d/i. No drainage noted.  Extremities: pulses intact   Lab Results: Results for orders placed or performed during the hospital encounter of 11/19/15 (from the past 24 hour(s))  CBC     Status: Abnormal   Collection Time: 11/27/15  6:57 AM  Result Value Ref Range   WBC 19.6 (H) 4.0 - 10.5 K/uL   RBC 3.33 (L) 3.87 - 5.11 MIL/uL   Hemoglobin 8.2 (L) 12.0 - 15.0 g/dL   HCT 25.4 (L) 36.0 - 46.0 %   MCV 76.3 (L) 78.0 - 100.0 fL   MCH 24.6 (L) 26.0 - 34.0 pg   MCHC 32.3 30.0 - 36.0 g/dL   RDW 18.0 (H) 11.5 - 15.5 %   Platelets 446 (H) 150 - 400 K/uL  Basic metabolic panel     Status: Abnormal   Collection Time: 11/27/15  6:57 AM  Result Value Ref Range   Sodium 141 135 - 145 mmol/L   Potassium 3.5 3.5 - 5.1 mmol/L   Chloride 107 101 - 111 mmol/L   CO2 26 22 - 32 mmol/L   Glucose, Bld 179 (H) 65 - 99 mg/dL   BUN 16 6 - 20 mg/dL   Creatinine, Ser 0.87 0.44 - 1.00 mg/dL   Calcium 8.5 (L) 8.9 - 10.3 mg/dL   GFR calc non Af Amer >60 >60 mL/min   GFR calc Af Amer >60 >60 mL/min   Anion gap 8 5 - 15    Micro Results: Recent Results (from the past 240 hour(s))  Blood culture (routine x 2)     Status: None   Collection Time: 11/19/15 10:50 AM  Result Value Ref Range Status   Specimen Description BLOOD RIGHT ANTECUBITAL  Final   Special Requests   Final    BOTTLES DRAWN AEROBIC AND ANAEROBIC 10CC AER 4 CC  ANA   Culture NO GROWTH 5 DAYS  Final   Report Status 11/24/2015 FINAL  Final  Blood culture (routine x 2)     Status: None   Collection Time: 11/19/15 10:55 AM  Result Value Ref Range Status   Specimen Description BLOOD RIGHT HAND  Final   Special Requests BOTTLES DRAWN AEROBIC ONLY 10CC  Final   Culture NO GROWTH 5 DAYS  Final   Report Status 11/24/2015 FINAL  Final  Anaerobic culture     Status: None (Preliminary result)   Collection Time: 11/23/15  1:03 PM  Result Value Ref Range Status   Specimen Description ABSCESS RIGHT HIP  Final   Special Requests NONE  Final   Gram Stain   Final    ABUNDANT WBC PRESENT,BOTH PMN AND MONONUCLEAR NO SQUAMOUS EPITHELIAL CELLS SEEN ABUNDANT GRAM POSITIVE COCCI IN PAIRS IN CLUSTERS Performed at [facility] Performed at [employer]    Culture   Final    NO ANAEROBES ISOLATED; CULTURE IN PROGRESS FOR 5 DAYS Performed at [facility]    Report Status PENDING  Incomplete  Gram stain     Status: None   Collection Time: 11/23/15  1:03 PM  Result Value Ref Range Status   Specimen Description ABSCESS RIGHT HIP  Final   Special  Requests NONE  Final   Gram Stain   Final    ABUNDANT WBC PRESENT, PREDOMINANTLY PMN ABUNDANT GRAM POSITIVE COCCI IN PAIRS IN CLUSTERS    Report Status 11/23/2015 FINAL  Final  Culture, routine-abscess     Status: None   Collection Time: 11/23/15  1:03 PM  Result Value Ref Range Status   Specimen Description ABSCESS RIGHT HIP  Final   Special Requests NONE  Final   Gram Stain   Final    ABUNDANT WBC PRESENT,BOTH PMN AND MONONUCLEAR NO SQUAMOUS EPITHELIAL CELLS SEEN ABUNDANT GRAM POSITIVE COCCI IN PAIRS IN CLUSTERS Performed at [facility] Performed at [employer] [facility]    Culture   Final    ABUNDANT METHICILLIN RESISTANT STAPHYLOCOCCUS AUREUS Note: RIFAMPIN AND GENTAMICIN SHOULD NOT BE USED AS SINGLE DRUGS FOR TREATMENT OF STAPH INFECTIONS. CRITICAL RESULT CALLED TO, READ BACK BY AND VERIFIED WITH: STEPHANIE ROGERS 11/26/15 0955 BY SMITHERSJ Performed at [facility]    Report Status 11/26/2015 FINAL  Final   Organism ID, Bacteria METHICILLIN RESISTANT STAPHYLOCOCCUS AUREUS  Final      Susceptibility   Methicillin resistant staphylococcus aureus - MIC*    CLINDAMYCIN <=0.25 SENSITIVE Sensitive     ERYTHROMYCIN 0.5 SENSITIVE Sensitive     GENTAMICIN <=0.5 SENSITIVE Sensitive     LEVOFLOXACIN 0.25 SENSITIVE Sensitive     OXACILLIN >=4 RESISTANT Resistant     RIFAMPIN <=0.5 SENSITIVE Sensitive     TRIMETH/SULFA <=10 SENSITIVE Sensitive     VANCOMYCIN 1 SENSITIVE Sensitive     TETRACYCLINE <=1 SENSITIVE Sensitive     * ABUNDANT METHICILLIN RESISTANT STAPHYLOCOCCUS AUREUS  Surgical pcr screen     Status: Abnormal   Collection Time: 11/24/15  5:00 AM  Result Value Ref Range Status   MRSA, PCR POSITIVE (A) NEGATIVE Final    Comment: RESULT CALLED TO, READ BACK BY AND VERIFIED WITH: KRUSSELL @0726  11/24/15 MKELLY    Staphylococcus aureus POSITIVE (A) NEGATIVE Final    Comment:        The Xpert SA Assay (FDA approved for NASAL specimens in patients over 88  years of age), is one component of a  comprehensive surveillance program.  Test performance has been validated by [facility] for patients greater than or equal to 70 year old. It is not intended to diagnose infection nor to guide or monitor treatment.    Studies/Results: No results found. Medications: I have reviewed the patient's current medications. Scheduled Meds: . amLODipine  5 mg Oral Daily  . bacitracin   Topical BID  . budesonide-formoterol  2 puff Inhalation BID  . Chlorhexidine Gluconate Cloth  6 each Topical Q0600  . cloNIDine  0.2 mg Oral BID  . erythromycin   Right Eye 3 times per day  . feeding supplement (ENSURE ENLIVE)  237 mL Oral Q24H  . gabapentin  300 mg Oral TID  . heparin  5,000 Units Subcutaneous 3 times per day  . hydroxychloroquine  200 mg Oral BID  . montelukast  10 mg Oral Daily  . multivitamin with minerals  1 tablet Oral Daily  . mupirocin ointment  1 application Nasal BID  . polyethylene glycol  17 g Oral Daily  . predniSONE  60 mg Oral Q breakfast  . senna-docusate  2 tablet Oral BID  . vancomycin  750 mg Intravenous Q12H  . zolpidem  10 mg Oral QHS   Continuous Infusions:   PRN Meds:.albuterol, cyclobenzaprine, diclofenac sodium, dicyclomine, HYDROmorphone (DILAUDID) injection, ondansetron, oxyCODONE, sodium chloride Assessment/Plan: Active Problems:   Lupus (HCC)   Hip pain   Lupus (systemic lupus erythematosus) (HCC)   Arthralgia of right hip  Right hip joint pain in the setting of multiple previous joint infections and surgeries (23 in total) s/p removal of right hip arthroplasty:  MRI + fluid collection, and an abscess could not be ruled out. Pt is on chronic prednisone,  WC has been around 18 since admission.  Blood cultures NGTD.  Patient underwent ct guided aspiration of her right hip on 1/23, and the fluid analysis showed turbid and brown fluid. Gram stain showed abundant gram positive cocci in pairs in clusters.  She is MRSA  positive.  She underwent I&d of right hip 11/24/2015.  CBC shows WC of 16-->  13.4--> 18--> 19.6  Culture growing methicillin resistant Staph aureus, sensitive to both vancomycin and bactrim.  No anaerobic growth.  As pt complaining of more pain today and worsening leukocytosis, we re-consulted orthopaedics Dr Ninfa Linden who said will come by and see the pt. Said no imaging needed currently as it will just show post-surgical changes. The leukocytosis may be from the chronic prednisone use. The sutures look c/d/i. -consulted ID and following for management of long term antibiotics -consulted CSW regarding SNF -on IV vancomycin per pharm  -d/c foley today   -pain control dilaudid 0.5 mg q2 hours PRN, and oxycodone 15 mg daily- we encouraged the pt to use the oxycodone first before using dilaudid. Plan is to wean the dilaudid. -contact precautions  Conjunctivitis: right eye redness with some earlier purulent drainage  -erythromycin ointment  -advised pt to use precautions like not touching her left eye which had no infection    Lupus: likely not a lupus flare. Her pain mainly from right hip which was I&Ded on 11/24/2015 -pain control with oxycodone 15 mg q6 hours PRN -currently on dilaudid 0.5 mg IV q2 hours PRN,    -prednisone 60 mg -plaquenil 200 mg bid  - voltaren gel - gabapentin   HTN: Pt 's bp has been mildly elevated -clonidine  Asthma: Continue albuterol, symbicort, and singulair   FEN--  Diet HH  DVT ppx-- heparin  Dispo:  To SNF    The patient does not have a current PCP (No Pcp Per Patient) and does need an [facility] hospital follow-up appointment after discharge.  The patient does not have transportation limitations that hinder transportation to clinic appointments.  .Services Needed at time of discharge: Y = Yes, Blank = No PT:   OT:   RN:   Equipment:   Other:     LOS: 5 days   [name], MD 11/27/2015, 12:31 PM

## 2015-11-27 NOTE — Progress Notes (Signed)
   11/27/15 0930  PT Visit Information  Reason Eval/Treat Not Completed Medical issues which prohibited therapy;Pain limiting ability to participate (Pt reports her white count is elevated and she does not wish to participate with therapy until she has spoken with MD.  will defer for now.  )

## 2015-11-27 NOTE — Progress Notes (Signed)
Patient ID: Katherine Banks, female   DOB: 1965/12/02, 50 y.o.   MRN: [id number] I came by to the bedside to assess [name] and her right hip.  She is status-post I&D of her right hip space 3 days ago.  She is on steroids and chronic pain meds as well as IV antibiotics.  Her WBC has increased and she states that her right hip hurts worse over the last day.  Her incision looks good and there is no drainage or redness.  Of course, given the obesity of this area, residual infection would be difficult to assess.  I'm not sure if a second I&D of this area is warranted.  A MRI would likely not be useful given the fact that it would show fluid from her recent surgery and would be difficult to interpret.  Another CBC to assess her WBC in the am will be helpful as well as continued close follow-up to determine whether or not further surgery is needed.  However, she is on strong IV antibiotics, which may be enough given her recent surgery.

## 2015-11-27 NOTE — Progress Notes (Addendum)
[facility] for Infectious Disease   Reason for visit: Follow up on osteomyelitis of hip[  Interval History: culture now with MRSA - CA.  Afebrile.  More pain today.    Physical Exam: Constitutional:  Filed Vitals:   11/27/15 0632 11/27/15 0847  BP: 123/68 154/88  Pulse: 60   Temp: 98.2 F (36.8 C)   Resp: 18    patient appears in NAD Respiratory: Normal respiratory effort; CTA B Cardiovascular: RRR  Review of Systems: Constitutional: negative for fevers and chills Gastrointestinal: negative for diarrhea  Lab Results  Component Value Date   WBC 19.6* 11/27/2015   HGB 8.2* 11/27/2015   HCT 25.4* 11/27/2015   MCV 76.3* 11/27/2015   PLT 446* 11/27/2015    Lab Results  Component Value Date   CREATININE 0.87 11/27/2015   BUN 16 11/27/2015   NA 141 11/27/2015   K 3.5 11/27/2015   CL 107 11/27/2015   CO2 26 11/27/2015    Lab Results  Component Value Date   ALT 21 07/31/2011   AST 18 07/31/2011   ALKPHOS 93 07/31/2011     Microbiology: Recent Results (from the past 240 hour(s))  Blood culture (routine x 2)     Status: None   Collection Time: 11/19/15 10:50 AM  Result Value Ref Range Status   Specimen Description BLOOD RIGHT ANTECUBITAL  Final   Special Requests   Final    BOTTLES DRAWN AEROBIC AND ANAEROBIC 10CC AER 4 CC ANA   Culture NO GROWTH 5 DAYS  Final   Report Status 11/24/2015 FINAL  Final  Blood culture (routine x 2)     Status: None   Collection Time: 11/19/15 10:55 AM  Result Value Ref Range Status   Specimen Description BLOOD RIGHT HAND  Final   Special Requests BOTTLES DRAWN AEROBIC ONLY 10CC  Final   Culture NO GROWTH 5 DAYS  Final   Report Status 11/24/2015 FINAL  Final  Anaerobic culture     Status: None (Preliminary result)   Collection Time: 11/23/15  1:03 PM  Result Value Ref Range Status   Specimen Description ABSCESS RIGHT HIP  Final   Special Requests NONE  Final   Gram Stain   Final    ABUNDANT WBC PRESENT,BOTH PMN AND  MONONUCLEAR NO SQUAMOUS EPITHELIAL CELLS SEEN ABUNDANT GRAM POSITIVE COCCI IN PAIRS IN CLUSTERS Performed at [facility] Performed at [facility]    Culture   Final    NO ANAEROBES ISOLATED; CULTURE IN PROGRESS FOR 5 DAYS Performed at [facility]    Report Status PENDING  Incomplete  Gram stain     Status: None   Collection Time: 11/23/15  1:03 PM  Result Value Ref Range Status   Specimen Description ABSCESS RIGHT HIP  Final   Special Requests NONE  Final   Gram Stain   Final    ABUNDANT WBC PRESENT, PREDOMINANTLY PMN ABUNDANT GRAM POSITIVE COCCI IN PAIRS IN CLUSTERS    Report Status 11/23/2015 FINAL  Final  Culture, routine-abscess     Status: None   Collection Time: 11/23/15  1:03 PM  Result Value Ref Range Status   Specimen Description ABSCESS RIGHT HIP  Final   Special Requests NONE  Final   Gram Stain   Final    ABUNDANT WBC PRESENT,BOTH PMN AND MONONUCLEAR NO SQUAMOUS EPITHELIAL CELLS SEEN ABUNDANT GRAM POSITIVE COCCI IN PAIRS IN CLUSTERS Performed at [facility] Performed at [facility]  Culture   Final    ABUNDANT METHICILLIN RESISTANT STAPHYLOCOCCUS AUREUS Note: RIFAMPIN AND GENTAMICIN SHOULD NOT BE USED AS SINGLE DRUGS FOR TREATMENT OF STAPH INFECTIONS. CRITICAL RESULT CALLED TO, READ BACK BY AND VERIFIED WITH: STEPHANIE ROGERS 11/26/15 0955 BY SMITHERSJ Performed at [facility]    Report Status 11/26/2015 FINAL  Final   Organism ID, Bacteria METHICILLIN RESISTANT STAPHYLOCOCCUS AUREUS  Final      Susceptibility   Methicillin resistant staphylococcus aureus - MIC*    CLINDAMYCIN <=0.25 SENSITIVE Sensitive     ERYTHROMYCIN 0.5 SENSITIVE Sensitive     GENTAMICIN <=0.5 SENSITIVE Sensitive     LEVOFLOXACIN 0.25 SENSITIVE Sensitive     OXACILLIN >=4 RESISTANT Resistant     RIFAMPIN <=0.5 SENSITIVE Sensitive     TRIMETH/SULFA <=10 SENSITIVE Sensitive     VANCOMYCIN 1 SENSITIVE Sensitive     TETRACYCLINE <=1  SENSITIVE Sensitive     * ABUNDANT METHICILLIN RESISTANT STAPHYLOCOCCUS AUREUS  Surgical pcr screen     Status: Abnormal   Collection Time: 11/24/15  5:00 AM  Result Value Ref Range Status   MRSA, PCR POSITIVE (A) NEGATIVE Final    Comment: RESULT CALLED TO, READ BACK BY AND VERIFIED WITH: KRUSSELL @0726  11/24/15 MKELLY    Staphylococcus aureus POSITIVE (A) NEGATIVE Final    Comment:        The Xpert SA Assay (FDA approved for NASAL specimens in patients over 30 years of age), is one component of a comprehensive surveillance program.  Test performance has been validated by [facility] for patients greater than or equal to 19 year old. It is not intended to diagnose infection nor to guide or monitor treatment.     Impression/Plan:  1. Osteomyelitis - fluid to greater trochanters and growth with MRSA.  History of [place] and so will remain with out hip.   Treat for 6 weeks with IV vancomycin with trough goal of 15-20 Weekly labs per SNF Can follow up with [facility] in 4-5 weeks, we will arrange follow up  Will also consider 3-6 months of oral continuation therapy and then long term suppressive antibiotics since she has had multiple infections and surgeries.  Sensitive to Bactrim or doxycycline.    2. Leukocytosis - on steroids, no signs of new infection.    I will sign off, please call with questions. thanks

## 2015-11-27 NOTE — Progress Notes (Addendum)
Patient seen and examined. Case d/w residents in detail. I agree with findings and plan as documented in Dr. Sherlynn Carbon note.  Patient still with R hip pain. No erythema or drainage around site. Will c/w pain control. Will need SNF placement on d/c. Will c/w IV vanc to complete 6 week course of R hip MRSA +ve abscess and patient to f/u with ID as outpatient to pursue chronic suppressive therapy given recurrent infections. Patient also noted to have mildly worsening leukocytosis (possibly secondary to steroids) and worsening pain - will get ortho to re evaluate. No imaging required currently per ortho.   Patient also noted to have right eye conjunctivitis. Will start erythromycin eye ointment.  Likely d/c to SNF in AM

## 2015-11-28 ENCOUNTER — Inpatient Hospital Stay (HOSPITAL_COMMUNITY): Payer: Medicare Other

## 2015-11-28 DIAGNOSIS — N63 Unspecified lump in breast: Secondary | ICD-10-CM

## 2015-11-28 DIAGNOSIS — Z79899 Other long term (current) drug therapy: Secondary | ICD-10-CM

## 2015-11-28 DIAGNOSIS — T8451XD Infection and inflammatory reaction due to internal right hip prosthesis, subsequent encounter: Secondary | ICD-10-CM

## 2015-11-28 DIAGNOSIS — Y838 Other surgical procedures as the cause of abnormal reaction of the patient, or of later complication, without mention of misadventure at the time of the procedure: Secondary | ICD-10-CM

## 2015-11-28 DIAGNOSIS — L0291 Cutaneous abscess, unspecified: Secondary | ICD-10-CM | POA: Insufficient documentation

## 2015-11-28 DIAGNOSIS — Z95828 Presence of other vascular implants and grafts: Secondary | ICD-10-CM | POA: Diagnosis present

## 2015-11-28 DIAGNOSIS — N6310 Unspecified lump in the right breast, unspecified quadrant: Secondary | ICD-10-CM | POA: Insufficient documentation

## 2015-11-28 LAB — BASIC METABOLIC PANEL
ANION GAP: 10 (ref 5–15)
BUN: 18 mg/dL (ref 6–20)
CHLORIDE: 106 mmol/L (ref 101–111)
CO2: 26 mmol/L (ref 22–32)
Calcium: 8.8 mg/dL — ABNORMAL LOW (ref 8.9–10.3)
Creatinine, Ser: 0.87 mg/dL (ref 0.44–1.00)
GFR calc Af Amer: >60 mL/min (ref >60)
Glucose, Bld: 170 mg/dL — ABNORMAL HIGH (ref 65–99)
POTASSIUM: 3.4 mmol/L — AB (ref 3.5–5.1)
SODIUM: 142 mmol/L (ref 135–145)

## 2015-11-28 LAB — CBC
HCT: 27.3 % — ABNORMAL LOW (ref 36.0–46.0)
HEMOGLOBIN: 8.5 g/dL — AB (ref 12.0–15.0)
MCH: 23.9 pg — AB (ref 26.0–34.0)
MCHC: 31.1 g/dL (ref 30.0–36.0)
MCV: 76.7 fL — ABNORMAL LOW (ref 78.0–100.0)
PLATELETS: 474 10*3/uL — AB (ref 150–400)
RBC: 3.56 MIL/uL — AB (ref 3.87–5.11)
RDW: 17.9 % — ABNORMAL HIGH (ref 11.5–15.5)
WBC: 19.6 10*3/uL — AB (ref 4.0–10.5)

## 2015-11-28 LAB — VANCOMYCIN, TROUGH: Vancomycin Tr: 8 ug/mL — ABNORMAL LOW (ref 10.0–20.0)

## 2015-11-28 LAB — ANAEROBIC CULTURE

## 2015-11-28 MED ORDER — VANCOMYCIN HCL 10 G IV SOLR
1250.0000 mg | Freq: Two times a day (BID) | INTRAVENOUS | Status: DC
  Administered 2015-11-28 – 2015-12-01 (×6): 1250 mg via INTRAVENOUS
  Filled 2015-11-28 (×8): qty 1250

## 2015-11-28 MED ORDER — PREDNISONE 20 MG PO TABS
40.0000 mg | ORAL_TABLET | Freq: Every day | ORAL | Status: DC
  Administered 2015-11-28 – 2015-12-01 (×4): 40 mg via ORAL
  Filled 2015-11-28 (×4): qty 2

## 2015-11-28 MED ORDER — HYDROMORPHONE HCL 1 MG/ML IJ SOLN
0.5000 mg | Freq: Once | INTRAMUSCULAR | Status: AC
  Administered 2015-11-28: 0.5 mg via INTRAVENOUS
  Filled 2015-11-28: qty 1

## 2015-11-28 MED ORDER — PREDNISONE 20 MG PO TABS
40.0000 mg | ORAL_TABLET | Freq: Every day | ORAL | Status: DC
Start: 2015-11-29 — End: 2015-11-28

## 2015-11-28 MED ORDER — VANCOMYCIN HCL IN DEXTROSE 750-5 MG/150ML-% IV SOLN
750.0000 mg | Freq: Three times a day (TID) | INTRAVENOUS | Status: DC
  Filled 2015-11-28 (×2): qty 150

## 2015-11-28 MED ORDER — OXYCODONE HCL 5 MG PO TABS: 10.0000 mg | ORAL_TABLET | ORAL | Status: DC | PRN

## 2015-11-28 MED ORDER — HYDROMORPHONE HCL 1 MG/ML IJ SOLN
0.5000 mg | INTRAMUSCULAR | Status: DC | PRN
  Administered 2015-11-29 – 2015-11-30 (×10): 0.5 mg via INTRAVENOUS
  Filled 2015-11-28 (×13): qty 1

## 2015-11-28 MED ORDER — PREDNISONE 20 MG PO TABS: 40.0000 mg | ORAL_TABLET | Freq: Every day | ORAL | Status: DC

## 2015-11-28 MED ORDER — POTASSIUM CHLORIDE 20 MEQ/15ML (10%) PO SOLN
20.0000 meq | Freq: Once | ORAL | Status: AC
  Administered 2015-11-28: 20 meq via ORAL
  Filled 2015-11-28: qty 15

## 2015-11-28 MED ORDER — OXYCODONE HCL 5 MG PO TABS
5.0000 mg | ORAL_TABLET | ORAL | Status: DC | PRN
  Administered 2015-11-28 (×2): 5 mg via ORAL
  Filled 2015-11-28 (×2): qty 1

## 2015-11-28 MED ORDER — OXYCODONE HCL ER 15 MG PO T12A
30.0000 mg | EXTENDED_RELEASE_TABLET | Freq: Two times a day (BID) | ORAL | Status: DC
  Administered 2015-11-28 – 2015-11-29 (×4): 30 mg via ORAL
  Filled 2015-11-28 (×4): qty 2

## 2015-11-28 NOTE — Progress Notes (Signed)
Patient ID: Katherine Banks, female   DOB: [birth year], 50 y.o.   MRN: [id number] I reviewed the CT scan of her right hip.  Not sure if there is still residual infection.  I also spoke with her and the attending on-call and decided to make a return trip to the OR tomorrow for a repeat I&D of her right hip space.  She understands this fully and does wish to proceed with surgery.

## 2015-11-28 NOTE — Progress Notes (Signed)
Pharmacy Antibiotic Follow-up Note  Katherine Banks is a 50 y.o. year-old female admitted on 11/19/2015.  The patient is currently on day 6 of vancomycin for osteomyelitis.  Assessment/Plan: Vanc trough today 8, below goal 15-20; last trough was 21 on vanc 1g Q12H but looking back on records it looks as though dose had been hung and finished running when lab was drawn.  Will change vanc to 750mg  IV Q8H for calculated trough ~16 and continue to monitor.  Temp (24hrs), Avg:98.5 F (36.9 C), Min:98.3 F (36.8 C), Max:98.8 F (37.1 C)   Recent Labs Lab 11/24/15 1225 11/25/15 0440 11/26/15 0930 11/27/15 0657 11/28/15 0520  WBC 16.7* 13.9* 18.3* 19.6* 19.6*     Recent Labs Lab 11/24/15 1225 11/25/15 0440 11/26/15 0930 11/27/15 0657 11/28/15 0520  CREATININE 0.73 0.96 0.89 0.87 0.87   Estimated Creatinine Clearance: 97.7 mL/min (by C-G formula based on Cr of 0.87).     Antimicrobials this admission: Vanc 1/23>>  Levels/dose changes this admission: 1/26 VT = 21 on 1g IV q12h- changed to 750mg  IV q12h  Microbiology results: 1/19 Blood: Neg 1/23 R hip abscess: abundant MRSA 1/24 MRSA PCR: pos  Thank you for allowing pharmacy to be a part of this patient's care.  Wynona Neat, PharmD, BCPS  11/28/2015 6:38 AM

## 2015-11-28 NOTE — Progress Notes (Signed)
Pt refused her erythromycin eye ointment, will continue to monitor

## 2015-11-28 NOTE — Progress Notes (Signed)
Pharmacy Antibiotic Follow-up Note  Katherine Banks is a 50 y.o. year-old female admitted on 11/19/2015.  The patient is currently on day 6 of vancomycin for osteomyelitis.  Assessment/Plan: Change vanc to 1.25g IV q12 Trough on Monday  Temp (24hrs), Avg:98.5 F (36.9 C), Min:98.3 F (36.8 C), Max:98.8 F (37.1 C)   Recent Labs Lab 11/24/15 1225 11/25/15 0440 11/26/15 0930 11/27/15 0657 11/28/15 0520  WBC 16.7* 13.9* 18.3* 19.6* 19.6*     Recent Labs Lab 11/24/15 1225 11/25/15 0440 11/26/15 0930 11/27/15 0657 11/28/15 0520  CREATININE 0.73 0.96 0.89 0.87 0.87   Estimated Creatinine Clearance: 97.7 mL/min (by C-G formula based on Cr of 0.87).     Antimicrobials this admission: Vanc 1/23>>  Levels/dose changes this admission: 1/26 VT = 21 on 1g IV q12h. A repeat trough was done last PM and came back at 8. I have my doubts about the level the other day. She is obese with normal scr. Will try to keep q12 dosing schedule since she will be on this for 6 wks and it would facilitate an easier schedule outpt when she is discharged.   Microbiology results: 1/19 Blood: Neg 1/23 R hip abscess: abundant MRSA 1/24 MRSA PCR: pos  Onnie Boer, PharmD Pager: [phone] 11/28/2015 12:04 PM

## 2015-11-28 NOTE — Progress Notes (Signed)
Subjective:  Patient seen and examined at bedside. She is POD4 s/p i&D of right intra hip abscess.  Pt said her pain has been about the same. She took all of dilaudid PRN. She is hoping for an answer to her persistent pain and infection. Orthopedics Dr Ninfa Linden came in while we were in the room, and we decided to do a CT, and he may do an I&D tomorrow.  We also decided to change her to oral oxycontin in lieu of dilaudid.      Objective: Vital signs in last 24 hours: Filed Vitals:   11/27/15 0847 11/27/15 1519 11/27/15 2137 11/28/15 0507  BP: 154/88 144/70 155/78 163/82  Pulse:  66 85 67  Temp:  98.3 F (36.8 C) 98.8 F (37.1 C) 98.4 F (36.9 C)  TempSrc:  Oral Oral Oral  Resp:  20 20 19   Height:      Weight:      SpO2:  100% 100% 100%   Weight change:   Intake/Output Summary (Last 24 hours) at 11/28/15 0937 Last data filed at 11/28/15 0900  Gross per 24 hour  Intake   1680 ml  Output   1875 ml  Net   -195 ml   General: Vital signs reviewed. Patient lying in bed  Cardiovascular: Reg rate , Reg rhythm, no murmur appreciated  Pulmonary/Chest: Clear to auscultation bilaterally, no wheezes Abdominal: Soft, obese, nontender to palpation  Hip: Right hip has a 4x4 on it- no drainage around it.  Extremities: pulses intact   Lab Results: Results for orders placed or performed during the hospital encounter of 11/19/15 (from the past 24 hour(s))  Vancomycin, trough     Status: Abnormal   Collection Time: 11/28/15  5:20 AM  Result Value Ref Range   Vancomycin Tr 8 (L) 10.0 - 20.0 ug/mL  CBC     Status: Abnormal   Collection Time: 11/28/15  5:20 AM  Result Value Ref Range   WBC 19.6 (H) 4.0 - 10.5 K/uL   RBC 3.56 (L) 3.87 - 5.11 MIL/uL   Hemoglobin 8.5 (L) 12.0 - 15.0 g/dL   HCT 27.3 (L) 36.0 - 46.0 %   MCV 76.7 (L) 78.0 - 100.0 fL   MCH 23.9 (L) 26.0 - 34.0 pg   MCHC 31.1 30.0 - 36.0 g/dL   RDW 17.9 (H) 11.5 - 15.5 %   Platelets 474 (H) 150 - 400 K/uL  Basic  metabolic panel     Status: Abnormal   Collection Time: 11/28/15  5:20 AM  Result Value Ref Range   Sodium 142 135 - 145 mmol/L   Potassium 3.4 (L) 3.5 - 5.1 mmol/L   Chloride 106 101 - 111 mmol/L   CO2 26 22 - 32 mmol/L   Glucose, Bld 170 (H) 65 - 99 mg/dL   BUN 18 6 - 20 mg/dL   Creatinine, Ser 0.87 0.44 - 1.00 mg/dL   Calcium 8.8 (L) 8.9 - 10.3 mg/dL   GFR calc non Af Amer >60 >60 mL/min   GFR calc Af Amer >60 >60 mL/min   Anion gap 10 5 - 15    Micro Results: Recent Results (from the past 240 hour(s))  Blood culture (routine x 2)     Status: None   Collection Time: 11/19/15 10:50 AM  Result Value Ref Range Status   Specimen Description BLOOD RIGHT ANTECUBITAL  Final   Special Requests   Final    BOTTLES DRAWN AEROBIC AND ANAEROBIC 10CC AER 4 CC  ANA   Culture NO GROWTH 5 DAYS  Final   Report Status 11/24/2015 FINAL  Final  Blood culture (routine x 2)     Status: None   Collection Time: 11/19/15 10:55 AM  Result Value Ref Range Status   Specimen Description BLOOD RIGHT HAND  Final   Special Requests BOTTLES DRAWN AEROBIC ONLY 10CC  Final   Culture NO GROWTH 5 DAYS  Final   Report Status 11/24/2015 FINAL  Final  Anaerobic culture     Status: None (Preliminary result)   Collection Time: 11/23/15  1:03 PM  Result Value Ref Range Status   Specimen Description ABSCESS RIGHT HIP  Final   Special Requests NONE  Final   Gram Stain   Final    ABUNDANT WBC PRESENT,BOTH PMN AND MONONUCLEAR NO SQUAMOUS EPITHELIAL CELLS SEEN ABUNDANT GRAM POSITIVE COCCI IN PAIRS IN CLUSTERS Performed at [facility] Performed at [employer]    Culture   Final    NO ANAEROBES ISOLATED; CULTURE IN PROGRESS FOR 5 DAYS Performed at [facility]    Report Status PENDING  Incomplete  Gram stain     Status: None   Collection Time: 11/23/15  1:03 PM  Result Value Ref Range Status   Specimen Description ABSCESS RIGHT HIP  Final   Special Requests NONE  Final   Gram Stain    Final    ABUNDANT WBC PRESENT, PREDOMINANTLY PMN ABUNDANT GRAM POSITIVE COCCI IN PAIRS IN CLUSTERS    Report Status 11/23/2015 FINAL  Final  Culture, routine-abscess     Status: None   Collection Time: 11/23/15  1:03 PM  Result Value Ref Range Status   Specimen Description ABSCESS RIGHT HIP  Final   Special Requests NONE  Final   Gram Stain   Final    ABUNDANT WBC PRESENT,BOTH PMN AND MONONUCLEAR NO SQUAMOUS EPITHELIAL CELLS SEEN ABUNDANT GRAM POSITIVE COCCI IN PAIRS IN CLUSTERS Performed at [facility] Performed at [employer]    Culture   Final    ABUNDANT METHICILLIN RESISTANT STAPHYLOCOCCUS AUREUS Note: RIFAMPIN AND GENTAMICIN SHOULD NOT BE USED AS SINGLE DRUGS FOR TREATMENT OF STAPH INFECTIONS. CRITICAL RESULT CALLED TO, READ BACK BY AND VERIFIED WITH: STEPHANIE ROGERS 11/26/15 0955 BY SMITHERSJ Performed at [facility]    Report Status 11/26/2015 FINAL  Final   Organism ID, Bacteria METHICILLIN RESISTANT STAPHYLOCOCCUS AUREUS  Final      Susceptibility   Methicillin resistant staphylococcus aureus - MIC*    CLINDAMYCIN <=0.25 SENSITIVE Sensitive     ERYTHROMYCIN 0.5 SENSITIVE Sensitive     GENTAMICIN <=0.5 SENSITIVE Sensitive     LEVOFLOXACIN 0.25 SENSITIVE Sensitive     OXACILLIN >=4 RESISTANT Resistant     RIFAMPIN <=0.5 SENSITIVE Sensitive     TRIMETH/SULFA <=10 SENSITIVE Sensitive     VANCOMYCIN 1 SENSITIVE Sensitive     TETRACYCLINE <=1 SENSITIVE Sensitive     * ABUNDANT METHICILLIN RESISTANT STAPHYLOCOCCUS AUREUS  Surgical pcr screen     Status: Abnormal   Collection Time: 11/24/15  5:00 AM  Result Value Ref Range Status   MRSA, PCR POSITIVE (A) NEGATIVE Final    Comment: RESULT CALLED TO, READ BACK BY AND VERIFIED WITH: KRUSSELL @0726  11/24/15 MKELLY    Staphylococcus aureus POSITIVE (A) NEGATIVE Final    Comment:        The Xpert SA Assay (FDA approved for NASAL specimens in patients over 62 years of age), is one component of a  comprehensive surveillance program.  Test performance has been validated by [facility] for patients greater than or equal to 78 year old. It is not intended to diagnose infection nor to guide or monitor treatment.    Studies/Results: No results found. Medications: I have reviewed the patient's current medications. Scheduled Meds: . amLODipine  5 mg Oral Daily  . bacitracin   Topical BID  . budesonide-formoterol  2 puff Inhalation BID  . Chlorhexidine Gluconate Cloth  6 each Topical Q0600  . cloNIDine  0.2 mg Oral BID  . erythromycin   Right Eye 3 times per day  . feeding supplement (ENSURE ENLIVE)  237 mL Oral Q24H  . gabapentin  300 mg Oral TID  . heparin  5,000 Units Subcutaneous 3 times per day  . hydroxychloroquine  200 mg Oral BID  . montelukast  10 mg Oral Daily  . multivitamin with minerals  1 tablet Oral Daily  . mupirocin ointment  1 application Nasal BID  . oxyCODONE  30 mg Oral Q12H  . polyethylene glycol  17 g Oral Daily  . predniSONE  40 mg Oral Q breakfast  . senna-docusate  2 tablet Oral BID  . vancomycin  750 mg Intravenous Q8H  . zolpidem  10 mg Oral QHS   Continuous Infusions:   PRN Meds:.albuterol, cyclobenzaprine, diclofenac sodium, dicyclomine, ondansetron, oxyCODONE, sodium chloride Assessment/Plan: Active Problems:   Lupus (HCC)   Hip pain   Lupus (systemic lupus erythematosus) (HCC)   Arthralgia of right hip  Right hip joint pain in the setting of multiple previous joint infections and surgeries (23 in total) s/p removal of right hip arthroplasty:  MRI + fluid collection, and an abscess could not be ruled out. Pt is on chronic prednisone,  WC has been around 18 since admission.  Blood cultures NGTD.  Patient underwent ct guided aspiration of her right hip on 1/23, and the fluid analysis showed turbid and brown fluid. Gram stain showed abundant gram positive cocci in pairs in clusters.  She is MRSA positive.  She underwent I&d of right hip  11/24/2015.  CBC shows WC of 16-->  13.4--> 18--> 19.6 --> 19 Culture growing methicillin resistant Staph aureus, sensitive to both vancomycin and bactrim.  No anaerobic growth.  As pt complaining of more pain on 1/27 and worsening leukocytosis, we re-consulted orthopaedics Dr Ninfa Linden. The leukocytosis may be from the chronic prednisone use.  On 1/28, the WC was stable at 97, and we decided to order a CT to show if there is any abscess present   -follow up CT  -orthopedics consulted and following  -consulted ID and following for management of long term antibiotics -consulted CSW regarding SNF -on IV vancomycin per pharm - we started to taper the prednisone off and will begin prednisone 40 mg daily instead of 60 mg daily    -pain control Oxycontin 30 mg BID, and Oxy 5 mg IR for breakthrough pain -discontinued oxycodone 15 mg and dilaudid   -contact precautions  Conjunctivitis: right eye redness with some earlier purulent drainage  -erythromycin ointment  -advised pt to use precautions like not touching her left eye which had no infection    Lupus: likely not a lupus flare. Her pain mainly from right hip which was I&Ded on 11/24/2015  -pain control with oxycontin 30 mg bid and oxy 5 mg IR for breakthrough pain -prednisone 40 mg -plaquenil 200 mg bid  - voltaren gel - gabapentin   HTN: Pt 's bp has been mildly  elevated -clonidine  Asthma: Continue albuterol, symbicort, and singulair   FEN--  Diet HH  DVT ppx-- heparin  Dispo:  To SNF    The patient does not have a current PCP (No Pcp Per Patient) and does need an [facility] hospital follow-up appointment after discharge.  The patient does not have transportation limitations that hinder transportation to clinic appointments.  .Services Needed at time of discharge: Y = Yes, Blank = No PT:   OT:   RN:   Equipment:   Other:     LOS: 6 days   [name], MD 11/28/2015, 9:37 AM

## 2015-11-28 NOTE — Progress Notes (Signed)
Patient ID: Katherine Banks, female   DOB: 07/06/1966, 50 y.o.   MRN: [id number] Medicine attending: I examined this patient this morning together with resident physician Dr. [name] and I concur with his evaluation and management plan which we discussed together. 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 She was admitted here on January 19 with nausea, vomiting, and diarrhea. She was afebrile but had an elevated white blood count of 18,000 with 81% neutrophils. She appears to have a chronic leukocytosis in the 11-14000 range recorded in 2012. No interim value since she moved to [place]. In view of her chronic hip pains and recurrent infection, CT scan was done on January 19 which showed changes from previous removal of the right hip prosthesis. No obvious osteomyelitis. Erosion of the acetabulum. Nonspecific changes between the acetabulum and femur. Soft tissue infection in this area could not be excluded. She was seen in consultation by orthopedic surgery. She had open exploration debridement and lavage with drainage of a right hip abscess on January 24. Operative findings were gross purulence in deep soft tissue space at the right hip. Although she has been afebrile on vancomycin, she has developed progressive pain in the area of the recent surgery and white blood count which initially was trending down one day postop at 13,900 is now elevated again at 19,600.  We had a discussion at the bedside with the patient, the orthopedic surgeon, and our medical team. Much of her pain is likely from the aggressive debridement of the abscess on January 24. It is not clear how much the chronic steroids and nonspecific inflammation is contributing to her leukocytosis. Although it may be low yield, we decided to get a noncontrast CT scan of the right hip area to see if there has  been reaccumulation of fluid which potentially would  need to be drained again. We are going to start to taper her steroids. We are going to transition her to an oral narcotic regimen with long-acting OxyContin using short acting oxycodone for breakthrough. She is moving back to the [place] area and at time of discharge will need to get established with a local rheumatologist. She also needs further evaluation of a palpable right breast mass.

## 2015-11-28 NOTE — Progress Notes (Signed)
Patient ID: Katherine Banks, female   DOB: 13-Jul-1966, 50 y.o.   MRN: [id number] WBC has stayed the same at 19, but her right groin pain is still persistent.  She remains afebrile.  I spoke with her primary team and we decided a CT scan today of her right hip may be helpful.  Will possibly schedule her for a repeat I&D of her right hip tomorrow.

## 2015-11-29 ENCOUNTER — Encounter (HOSPITAL_COMMUNITY)
Admission: EM | Disposition: A | Discharge status: Skilled Nursing Facility | Payer: Self-pay | Source: Home / Self Care | Attending: Internal Medicine

## 2015-11-29 ENCOUNTER — Inpatient Hospital Stay (HOSPITAL_COMMUNITY): Payer: Medicare Other | Admitting: Anesthesiology

## 2015-11-29 DIAGNOSIS — D72829 Elevated white blood cell count, unspecified: Secondary | ICD-10-CM | POA: Insufficient documentation

## 2015-11-29 HISTORY — PX: INCISION AND DRAINAGE HIP: SHX1801

## 2015-11-29 LAB — BASIC METABOLIC PANEL
ANION GAP: 4 — AB (ref 5–15)
BUN: 19 mg/dL (ref 6–20)
CALCIUM: 8.4 mg/dL — AB (ref 8.9–10.3)
CO2: 27 mmol/L (ref 22–32)
CREATININE: 0.84 mg/dL (ref 0.44–1.00)
Chloride: 107 mmol/L (ref 101–111)
GFR calc Af Amer: >60 mL/min (ref >60)
Glucose, Bld: 130 mg/dL — ABNORMAL HIGH (ref 65–99)
Potassium: 3.7 mmol/L (ref 3.5–5.1)
Sodium: 138 mmol/L (ref 135–145)

## 2015-11-29 LAB — SURGICAL PCR SCREEN
MRSA, PCR: POSITIVE — AB
STAPHYLOCOCCUS AUREUS: POSITIVE — AB

## 2015-11-29 LAB — CBC
HEMATOCRIT: 27.3 % — AB (ref 36.0–46.0)
Hemoglobin: 8.6 g/dL — ABNORMAL LOW (ref 12.0–15.0)
MCH: 24.2 pg — ABNORMAL LOW (ref 26.0–34.0)
MCHC: 31.5 g/dL (ref 30.0–36.0)
MCV: 76.7 fL — AB (ref 78.0–100.0)
Platelets: 467 10*3/uL — ABNORMAL HIGH (ref 150–400)
RBC: 3.56 MIL/uL — AB (ref 3.87–5.11)
RDW: 18.1 % — ABNORMAL HIGH (ref 11.5–15.5)
WBC: 19.3 10*3/uL — AB (ref 4.0–10.5)

## 2015-11-29 SURGERY — IRRIGATION AND DEBRIDEMENT HIP
Anesthesia: General | Site: Hip | Laterality: Right

## 2015-11-29 MED ORDER — HYDROCORTISONE NA SUCCINATE PF 1000 MG IJ SOLR
INTRAMUSCULAR | Status: DC | PRN
  Administered 2015-11-29: 100 mg via INTRAVENOUS

## 2015-11-29 MED ORDER — PHENYLEPHRINE 40 MCG/ML (10ML) SYRINGE FOR IV PUSH (FOR BLOOD PRESSURE SUPPORT)
PREFILLED_SYRINGE | INTRAVENOUS | Status: AC
  Filled 2015-11-29: qty 10

## 2015-11-29 MED ORDER — FENTANYL CITRATE (PF) 100 MCG/2ML IJ SOLN
INTRAMUSCULAR | Status: DC | PRN
  Administered 2015-11-29: 100 ug via INTRAVENOUS
  Administered 2015-11-29 (×2): 50 ug via INTRAVENOUS

## 2015-11-29 MED ORDER — PROPOFOL 10 MG/ML IV BOLUS
INTRAVENOUS | Status: DC | PRN
  Administered 2015-11-29: 160 mg via INTRAVENOUS

## 2015-11-29 MED ORDER — MIDAZOLAM HCL 2 MG/2ML IJ SOLN
INTRAMUSCULAR | Status: AC
  Filled 2015-11-29: qty 2

## 2015-11-29 MED ORDER — SODIUM CHLORIDE 0.9 % IR SOLN
Status: DC | PRN
  Administered 2015-11-29 (×2): 3000 mL

## 2015-11-29 MED ORDER — PROPOFOL 10 MG/ML IV BOLUS
INTRAVENOUS | Status: AC
  Filled 2015-11-29: qty 20

## 2015-11-29 MED ORDER — LIDOCAINE HCL (CARDIAC) 20 MG/ML IV SOLN
INTRAVENOUS | Status: AC
  Filled 2015-11-29: qty 5

## 2015-11-29 MED ORDER — HYDROCORTISONE NA SUCCINATE PF 100 MG IJ SOLR
INTRAMUSCULAR | Status: AC
  Filled 2015-11-29: qty 2

## 2015-11-29 MED ORDER — FENTANYL CITRATE (PF) 100 MCG/2ML IJ SOLN
25.0000 ug | INTRAMUSCULAR | Status: DC | PRN
  Administered 2015-11-29 (×3): 50 ug via INTRAVENOUS

## 2015-11-29 MED ORDER — ALBUMIN HUMAN 5 % IV SOLN
INTRAVENOUS | Status: DC | PRN
  Administered 2015-11-29: 08:00:00 via INTRAVENOUS

## 2015-11-29 MED ORDER — SUCCINYLCHOLINE CHLORIDE 20 MG/ML IJ SOLN
INTRAMUSCULAR | Status: DC | PRN
  Administered 2015-11-29: 120 mg via INTRAVENOUS

## 2015-11-29 MED ORDER — FENTANYL CITRATE (PF) 250 MCG/5ML IJ SOLN
INTRAMUSCULAR | Status: AC
  Filled 2015-11-29: qty 5

## 2015-11-29 MED ORDER — LIDOCAINE HCL (CARDIAC) 20 MG/ML IV SOLN
INTRAVENOUS | Status: DC | PRN
  Administered 2015-11-29: 50 mg via INTRAVENOUS

## 2015-11-29 MED ORDER — FENTANYL CITRATE (PF) 100 MCG/2ML IJ SOLN
INTRAMUSCULAR | Status: AC
  Administered 2015-11-29: 50 ug via INTRAVENOUS
  Filled 2015-11-29: qty 4

## 2015-11-29 MED ORDER — ROCURONIUM BROMIDE 50 MG/5ML IV SOLN
INTRAVENOUS | Status: AC
  Filled 2015-11-29: qty 1

## 2015-11-29 MED ORDER — GLYCOPYRROLATE 0.2 MG/ML IJ SOLN
INTRAMUSCULAR | Status: AC
  Filled 2015-11-29: qty 1

## 2015-11-29 MED ORDER — LACTATED RINGERS IV SOLN
INTRAVENOUS | Status: DC | PRN
  Administered 2015-11-29: 07:00:00 via INTRAVENOUS

## 2015-11-29 MED ORDER — SUCCINYLCHOLINE CHLORIDE 20 MG/ML IJ SOLN
INTRAMUSCULAR | Status: AC
  Filled 2015-11-29: qty 1

## 2015-11-29 MED ORDER — MIDAZOLAM HCL 5 MG/5ML IJ SOLN
INTRAMUSCULAR | Status: DC | PRN
  Administered 2015-11-29: 2 mg via INTRAVENOUS

## 2015-11-29 SURGICAL SUPPLY — 51 items
BAG DECANTER FOR FLEXI CONT (MISCELLANEOUS) IMPLANT
COVER SURGICAL LIGHT HANDLE (MISCELLANEOUS) ×2 IMPLANT
DRAPE IMP U-DRAPE 54X76 (DRAPES) ×6 IMPLANT
DRAPE ORTHO SPLIT 77X108 STRL (DRAPES) ×2
DRAPE SURG ORHT 6 SPLT 77X108 (DRAPES) ×2 IMPLANT
DRAPE U-SHAPE 47X51 STRL (DRAPES) ×4 IMPLANT
DRSG ADAPTIC 3X8 NADH LF (GAUZE/BANDAGES/DRESSINGS) ×2 IMPLANT
DRSG MEPILEX BORDER 4X8 (GAUZE/BANDAGES/DRESSINGS) ×2 IMPLANT
DRSG PAD ABDOMINAL 8X10 ST (GAUZE/BANDAGES/DRESSINGS) ×2 IMPLANT
DURAPREP 26ML APPLICATOR (WOUND CARE) ×2 IMPLANT
ELECT BLADE 4.0 EZ CLEAN MEGAD (MISCELLANEOUS) ×2
ELECT CAUTERY BLADE 6.4 (BLADE) IMPLANT
ELECT REM PT RETURN 9FT ADLT (ELECTROSURGICAL)
ELECTRODE BLDE 4.0 EZ CLN MEGD (MISCELLANEOUS) ×1 IMPLANT
ELECTRODE REM PT RTRN 9FT ADLT (ELECTROSURGICAL) IMPLANT
GAUZE SPONGE 4X4 12PLY STRL (GAUZE/BANDAGES/DRESSINGS) ×2 IMPLANT
GAUZE XEROFORM 5X9 LF (GAUZE/BANDAGES/DRESSINGS) ×2 IMPLANT
GLOVE BIO SURGEON STRL SZ8 (GLOVE) ×2 IMPLANT
GLOVE BIOGEL PI IND STRL 8 (GLOVE) ×1 IMPLANT
GLOVE BIOGEL PI INDICATOR 8 (GLOVE) ×1
GLOVE ORTHO TXT STRL SZ7.5 (GLOVE) ×2 IMPLANT
GOWN STRL REUS W/ TWL LRG LVL3 (GOWN DISPOSABLE) IMPLANT
GOWN STRL REUS W/ TWL XL LVL3 (GOWN DISPOSABLE) ×2 IMPLANT
GOWN STRL REUS W/TWL LRG LVL3 (GOWN DISPOSABLE)
GOWN STRL REUS W/TWL XL LVL3 (GOWN DISPOSABLE) ×2
HANDPIECE INTERPULSE COAX TIP (DISPOSABLE)
KIT BASIN OR (CUSTOM PROCEDURE TRAY) ×2 IMPLANT
KIT ROOM TURNOVER OR (KITS) ×2 IMPLANT
MANIFOLD NEPTUNE II (INSTRUMENTS) ×2 IMPLANT
NS IRRIG 1000ML POUR BTL (IV SOLUTION) ×2 IMPLANT
PACK GENERAL/GYN (CUSTOM PROCEDURE TRAY) ×2 IMPLANT
PACK TOTAL JOINT (CUSTOM PROCEDURE TRAY) IMPLANT
PACK UNIVERSAL I (CUSTOM PROCEDURE TRAY) IMPLANT
PAD ARMBOARD 7.5X6 YLW CONV (MISCELLANEOUS) ×4 IMPLANT
SET HNDPC FAN SPRY TIP SCT (DISPOSABLE) IMPLANT
SPONGE LAP 18X18 X RAY DECT (DISPOSABLE) ×2 IMPLANT
STAPLER VISISTAT 35W (STAPLE) ×2 IMPLANT
SUT ETHILON 2 0 FS 18 (SUTURE) ×4 IMPLANT
SUT VIC AB 0 CT1 27 (SUTURE) ×1
SUT VIC AB 0 CT1 27XBRD ANBCTR (SUTURE) ×1 IMPLANT
SUT VIC AB 1 CT1 27 (SUTURE) ×1
SUT VIC AB 1 CT1 27XBRD ANBCTR (SUTURE) ×1 IMPLANT
SUT VIC AB 1 CTB1 27 (SUTURE) ×4 IMPLANT
SUT VIC AB 2-0 CT1 27 (SUTURE) ×2
SUT VIC AB 2-0 CT1 TAPERPNT 27 (SUTURE) ×2 IMPLANT
SUT VICRYL 0 CT 1 36IN (SUTURE) IMPLANT
TOWEL OR 17X24 6PK STRL BLUE (TOWEL DISPOSABLE) ×2 IMPLANT
TOWEL OR 17X26 10 PK STRL BLUE (TOWEL DISPOSABLE) ×2 IMPLANT
TUBE ANAEROBIC SPECIMEN COL (MISCELLANEOUS) IMPLANT
UNDERPAD 30X30 INCONTINENT (UNDERPADS AND DIAPERS) ×2 IMPLANT
WATER STERILE IRR 1000ML POUR (IV SOLUTION) ×2 IMPLANT

## 2015-11-29 NOTE — Anesthesia Procedure Notes (Signed)
Procedure Name: Intubation Date/Time: 11/29/2015 7:48 AM Performed by: Eligha Bridegroom Pre-anesthesia Checklist: Emergency Drugs available, Patient identified, Timeout performed, Suction available and Patient being monitored Patient Re-evaluated:Patient Re-evaluated prior to inductionOxygen Delivery Method: Circle system utilized Preoxygenation: Pre-oxygenation with 100% oxygen Intubation Type: IV induction Ventilation: Mask ventilation without difficulty Laryngoscope Size: Mac and 4 Tube size: 7.0 mm Number of attempts: 1 Airway Equipment and Method: Stylet and LTA kit utilized Placement Confirmation: ETT inserted through vocal cords under direct vision,  breath sounds checked- equal and bilateral and positive ETCO2 Secured at: 22 cm Tube secured with: Tape Dental Injury: Teeth and Oropharynx as per pre-operative assessment

## 2015-11-29 NOTE — Anesthesia Postprocedure Evaluation (Signed)
Anesthesia Post Note  Patient: Katherine Banks  Procedure(s) Performed: Procedure(s) (LRB): IRRIGATION AND DEBRIDEMENT HIP RECURRENT (Right)  Patient location during evaluation: PACU Anesthesia Type: General Level of consciousness: awake Pain management: pain level controlled Respiratory status: spontaneous breathing Cardiovascular status: stable Anesthetic complications: no    Last Vitals:  Filed Vitals:   11/29/15 0930 11/29/15 0945  BP: 158/74 139/74  Pulse: 80 78  Temp:  36.4 C  Resp: 12 15    Last Pain:  Filed Vitals:   11/29/15 0955  PainSc: 7                  EDWARDS,Farid Grigorian

## 2015-11-29 NOTE — Brief Op Note (Signed)
11/19/2015 - 11/29/2015  8:47 AM  PATIENT:  Katherine Banks  50 y.o. female  PRE-OPERATIVE DIAGNOSIS:  RIGHT HIP INFECTION  POST-OPERATIVE DIAGNOSIS:  RIGHT HIP INFECTION  PROCEDURE:  Procedure(s): IRRIGATION AND DEBRIDEMENT HIP RECURRENT (Right)  SURGEON:  Surgeon(s) and Role:    * Mcarthur Rossetti, MD - Primary  ANESTHESIA:   general  EBL:  Total I/O In: 250 [IV Piggyback:250] Out: -   COUNTS:  YES  DICTATION: .Other Dictation: Dictation Number [id number]  PLAN OF CARE: Admit to inpatient   PATIENT DISPOSITION:  PACU - hemodynamically stable.   Delay start of Pharmacological VTE agent (>24hrs) due to surgical blood loss or risk of bleeding: not applicable

## 2015-11-29 NOTE — Transfer of Care (Signed)
Immediate Anesthesia Transfer of Care Note  Patient: Katherine Banks  Procedure(s) Performed: Procedure(s): IRRIGATION AND DEBRIDEMENT HIP RECURRENT (Right)  Patient Location: PACU  Anesthesia Type:General  Level of Consciousness: awake, alert  and oriented  Airway & Oxygen Therapy: Patient Spontanous Breathing and Patient connected to nasal cannula oxygen  Post-op Assessment: Report given to RN and Post -op Vital signs reviewed and stable  Post vital signs: Reviewed and stable  Last Vitals:  Filed Vitals:   11/28/15 2214 11/29/15 0632  BP: 161/71 149/77  Pulse: 72 65  Temp: 37.1 C 36.6 C  Resp: 18 18    Complications: No apparent anesthesia complications

## 2015-11-29 NOTE — Progress Notes (Signed)
Pt refuse her bacitracin, erythromycin eye ointment, and mupirocin, pt complain with tears that her pain med oxycodone dont work with her pain, MD on call from internal medicine was informed about pt complain, he came over to speak with the pt and put her back on iv delauded Q2, pt seems ok since she is getting her pain medicine every two hour as orderd, will continue to monitor

## 2015-11-29 NOTE — Op Note (Signed)
NAMEMarland Banks  ADANYA, KITTRELL NO.:  [id number]  MEDICAL RECORD NO.:  [id number]  LOCATION:  [facility]                         FACILITY:  [facility]  PHYSICIAN:  Lind Guest. 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  PROCEDURE:  Repeat irrigation and debridement of right hip space.  SURGEON:  Lind Guest. Ninfa Linden, MD  ANESTHESIA:  General.  BLOOD LOSS:  Less than 100 mL.  COMPLICATIONS:  None.  INDICATIONS:  [name] is a 50 year old, who has had multiple surgeries on her right hip done up in [place].  She originally had a total hip arthroplasty, then had several revisions, eventually became infected.  She developed a chronic infection and eventually had everything removed completely.  Her last surgery was in 2015, since then she was moved down to [place].  She basically has no hip on the right side at all, but she does have a residual leg.  She is able to somehow ambulate on this.  She developed some sickness and we did find a pocket of infection in the right hip space.  We took her to the operating room last week for an irrigation and debridement.  Her white count as stated high, but she is on prednisone.  We felt that secondary I and D would be useful to determine if there were any other areas of purulence.  The risks and benefits of surgery were explained to her in detail and she and her primary team did feel that surgery would be necessary as did I.  PROCEDURE DESCRIPTION:  After informed consent was obtained, appropriate right  hip was marked.  She was brought to the operating room.  General anesthesia was obtained while she was on her stretcher.  She was placed supine on the Hana fracture table.  The perineal post in place and both legs in inline skeletal traction devices.  Her right operative hip was then prepped and draped with DuraPrep and sterile drapes.  A time-out was called and she was identified as correct patient, correct right hip. I then made a lateral based incision proximal to the greater trochanter, so I could then dissect down into the deep tissues.  I was able to open up the iliotibial band and get behind the trochanter and anterior to this to see if there were any residual pockets of infection.  I did find serous drainage and serous fluid, but did not find anything that appeared to be grossly purulent.  I spent some time using a rongeur removing any type of necrotic soft tissue.  No bone was removed at all. I then irrigated 6 L normal saline solution through the dead space in her hip.  I then irrigated everything out, but I could not close the deep tissue with #1 Vicryl followed by 0 Vicryl into the next layer, 2-0 Vicryl subcutaneous tissue, interrupted 2-0  nylon on the skin. Xeroform and well-padded sterile dressing was applied.  She was taken off the Hana table, awakened, extubated, and taken to the recovery room in stable condition.  All final counts were correct.  There were no complications noted.     Lind Guest. Ninfa Linden, M.D.     CYB/MEDQ  D:  11/29/2015  T:  11/29/2015  Job:  [id number]

## 2015-11-29 NOTE — Progress Notes (Signed)
Subjective: Tearful and frustrated over pain control. Requesting reg diet and foley.    Objective: Vital signs in last 24 hours: Filed Vitals:   11/29/15 0900 11/29/15 0915 11/29/15 0930 11/29/15 0945  BP: 166/68 165/85 158/74 139/74  Pulse: 100 88 80 78  Temp:    97.6 F (36.4 C)  TempSrc:      Resp: 12 14 12 15   Height:      Weight:      SpO2: 93% 99% 100% 100%   Weight change:   Intake/Output Summary (Last 24 hours) at 11/29/15 1130 Last data filed at 11/29/15 0852  Gross per 24 hour  Intake   2090 ml  Output   1750 ml  Net    340 ml   General:tearful. Patient lying in bed  Cardiovascular: tachy , Reg rhythm, no murmur appreciated  Pulm: normal effort Abdominal: Soft, obese, nontender to palpation  Neuro: CN 2-12 grossly intact   Lab Results: Results for orders placed or performed during the hospital encounter of 11/19/15 (from the past 24 hour(s))  CBC     Status: Abnormal   Collection Time: 11/29/15  5:19 AM  Result Value Ref Range   WBC 19.3 (H) 4.0 - 10.5 K/uL   RBC 3.56 (L) 3.87 - 5.11 MIL/uL   Hemoglobin 8.6 (L) 12.0 - 15.0 g/dL   HCT 27.3 (L) 36.0 - 46.0 %   MCV 76.7 (L) 78.0 - 100.0 fL   MCH 24.2 (L) 26.0 - 34.0 pg   MCHC 31.5 30.0 - 36.0 g/dL   RDW 18.1 (H) 11.5 - 15.5 %   Platelets 467 (H) 150 - 400 K/uL  Basic metabolic panel     Status: Abnormal   Collection Time: 11/29/15  5:19 AM  Result Value Ref Range   Sodium 138 135 - 145 mmol/L   Potassium 3.7 3.5 - 5.1 mmol/L   Chloride 107 101 - 111 mmol/L   CO2 27 22 - 32 mmol/L   Glucose, Bld 130 (H) 65 - 99 mg/dL   BUN 19 6 - 20 mg/dL   Creatinine, Ser 0.84 0.44 - 1.00 mg/dL   Calcium 8.4 (L) 8.9 - 10.3 mg/dL   GFR calc non Af Amer >60 >60 mL/min   GFR calc Af Amer >60 >60 mL/min   Anion gap 4 (L) 5 - 15  Surgical pcr screen     Status: Abnormal   Collection Time: 11/29/15  5:54 AM  Result Value Ref Range   MRSA, PCR POSITIVE (A) NEGATIVE   Staphylococcus aureus POSITIVE (A) NEGATIVE     Micro Results: Recent Results (from the past 240 hour(s))  Anaerobic culture     Status: None   Collection Time: 11/23/15  1:03 PM  Result Value Ref Range Status   Specimen Description ABSCESS RIGHT HIP  Final   Special Requests NONE  Final   Gram Stain   Final    ABUNDANT WBC PRESENT,BOTH PMN AND MONONUCLEAR NO SQUAMOUS EPITHELIAL CELLS SEEN ABUNDANT GRAM POSITIVE COCCI IN PAIRS IN CLUSTERS Performed at [facility] Performed at [place]   Final    NO ANAEROBES ISOLATED Performed at [facility]    Report Status 11/28/2015 FINAL  Final  Gram stain     Status: None   Collection Time: 11/23/15  1:03 PM  Result Value Ref Range Status   Specimen Description ABSCESS RIGHT HIP  Final   Special Requests NONE  Final   Gram  Stain   Final    ABUNDANT WBC PRESENT, PREDOMINANTLY PMN ABUNDANT GRAM POSITIVE COCCI IN PAIRS IN CLUSTERS    Report Status 11/23/2015 FINAL  Final  Culture, routine-abscess     Status: None   Collection Time: 11/23/15  1:03 PM  Result Value Ref Range Status   Specimen Description ABSCESS RIGHT HIP  Final   Special Requests NONE  Final   Gram Stain   Final    ABUNDANT WBC PRESENT,BOTH PMN AND MONONUCLEAR NO SQUAMOUS EPITHELIAL CELLS SEEN ABUNDANT GRAM POSITIVE COCCI IN PAIRS IN CLUSTERS Performed at [facility] Performed at [facility]    Culture   Final    ABUNDANT METHICILLIN RESISTANT STAPHYLOCOCCUS AUREUS Note: RIFAMPIN AND GENTAMICIN SHOULD NOT BE USED AS SINGLE DRUGS FOR TREATMENT OF STAPH INFECTIONS. CRITICAL RESULT CALLED TO, READ BACK BY AND VERIFIED WITH: STEPHANIE ROGERS 11/26/15 0955 BY SMITHERSJ Performed at [facility]    Report Status 11/26/2015 FINAL  Final   Organism ID, Bacteria METHICILLIN RESISTANT STAPHYLOCOCCUS AUREUS  Final      Susceptibility   Methicillin resistant staphylococcus aureus - MIC*    CLINDAMYCIN <=0.25 SENSITIVE Sensitive     ERYTHROMYCIN 0.5  SENSITIVE Sensitive     GENTAMICIN <=0.5 SENSITIVE Sensitive     LEVOFLOXACIN 0.25 SENSITIVE Sensitive     OXACILLIN >=4 RESISTANT Resistant     RIFAMPIN <=0.5 SENSITIVE Sensitive     TRIMETH/SULFA <=10 SENSITIVE Sensitive     VANCOMYCIN 1 SENSITIVE Sensitive     TETRACYCLINE <=1 SENSITIVE Sensitive     * ABUNDANT METHICILLIN RESISTANT STAPHYLOCOCCUS AUREUS  Surgical pcr screen     Status: Abnormal   Collection Time: 11/24/15  5:00 AM  Result Value Ref Range Status   MRSA, PCR POSITIVE (A) NEGATIVE Final    Comment: RESULT CALLED TO, READ BACK BY AND VERIFIED WITH: KRUSSELL @0726  11/24/15 MKELLY    Staphylococcus aureus POSITIVE (A) NEGATIVE Final    Comment:        The Xpert SA Assay (FDA approved for NASAL specimens in patients over 58 years of age), is one component of a comprehensive surveillance program.  Test performance has been validated by [facility] for patients greater than or equal to 71 year old. It is not intended to diagnose infection nor to guide or monitor treatment.   Surgical pcr screen     Status: Abnormal   Collection Time: 11/29/15  5:54 AM  Result Value Ref Range Status   MRSA, PCR POSITIVE (A) NEGATIVE Final    Comment: RESULT CALLED TO, READ BACK BY AND VERIFIED WITH: T JETER 11/29/15 @ 0825 M VESTAL    Staphylococcus aureus POSITIVE (A) NEGATIVE Final    Comment:        The Xpert SA Assay (FDA approved for NASAL specimens in patients over 67 years of age), is one component of a comprehensive surveillance program.  Test performance has been validated by [facility] for patients greater than or equal to 13 year old. It is not intended to diagnose infection nor to guide or monitor treatment.    Studies/Results: Ct Hip Right Wo Contrast  11/28/2015  CLINICAL DATA:  Right hip pain. History of lupus with numerous right hip surgeries due to recurrent infection. EXAM: CT OF THE RIGHT HIP WITHOUT CONTRAST TECHNIQUE: Multidetector CT imaging of  the right hip was performed according to the standard protocol. Multiplanar CT image reconstructions were also generated. COMPARISON:  11/19/2015 FINDINGS: The patient had incision and drainage  of the right hip joint yielding gross purulent material and deep soft tissue space infection on 11/24/2015. Prior Girdlestone procedure. If on today' s exam in the subcutaneous tissues there is some gas and fluid along the wound site, collection measuring 3.9 by 3.4 cm on image 20 series 4. Tiny locules of gas are also present along the deeper gluteus medius muscle and not inconsistent with recent operative intervention. There is reduction in the fluid density in the right hip joint as well as reduction in the space between the femur and the acetabulum, previously 8.8 cm and currently 7.2 cm, reflecting removal of some of the volume of material within the joint. There is still a band of low density posteriorly in the joint for example on image 35 series 4, and extensive soft tissue density within the joint itself compatible with phlegmon. Several small locules of gas are present posterolateral to the proximal femur as on image 43 of series 4. There is some increase in the soft tissue density posterolateral to the right proximal femur for example on image 41 series 4, compared to 11/19/15, probably connecting to the joint and possibly reflecting trochanteric bursa. My understanding from the operative note is that the anterior approach was utilized but not the prior posterior incision site. 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 No new bony abnormality. Reactive right external iliac nodes are present. No sciatic notch impingement. No definite obturator impingement although there is some indistinctness of the tissues along the hip adductor musculature similar to prior. 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 There is a curvilinear 1.3 by 0.5 cm bony fragment in the central cavity along the proximal right femur, bony sequestrum not excluded. IMPRESSION: 1. Purulent collection from the right hip joint has been drained, with resulting reduced volume in the right hip joint. Small collections of gas and fluid track along the operative wound, at 4 days postop these could be incidental non infected collections and are not considered specific for abscess. Small loculations of gas are also noted along the gluteus medius muscle and posterolateral to the right hip. There is some increase in the soft tissue density posterolateral to the right hip likely reflecting phlegmon or possibly blood products distending this space. I suspect that there is continuity of the joint and this space posterolateral to the remaining proximal femur. 2. Prior Girdlestone procedure, with stable bony deformities. Proximal femoral appearance suggests chronic osteomyelitis. Possible bony sequestrum in the proximal right femur. Electronically Signed   By: Van Clines M.D.   On: 11/28/2015 11:52   Medications: I have reviewed the patient's current medications. Scheduled Meds: . amLODipine  5 mg Oral Daily  . bacitracin   Topical BID  . budesonide-formoterol  2 puff Inhalation BID  . Chlorhexidine Gluconate Cloth  6 each Topical Q0600  . cloNIDine  0.2 mg Oral BID  . erythromycin   Right Eye 3 times per day  . feeding supplement (ENSURE ENLIVE)  237 mL Oral Q24H  . gabapentin  300 mg Oral TID  . heparin  5,000 Units Subcutaneous 3 times per day  . hydroxychloroquine  200 mg Oral BID  . montelukast  10 mg Oral Daily  . multivitamin with minerals  1 tablet Oral Daily  . mupirocin ointment  1 application Nasal BID  . oxyCODONE  30 mg Oral Q12H  .  polyethylene glycol  17 g Oral Daily  . predniSONE  40 mg Oral Q breakfast  . senna-docusate  2 tablet Oral BID  .  vancomycin  1,250 mg Intravenous Q12H  . zolpidem  10 mg Oral QHS   Continuous Infusions:   PRN Meds:.albuterol, cyclobenzaprine, diclofenac sodium, dicyclomine, HYDROmorphone (DILAUDID) injection, ondansetron, sodium chloride Assessment/Plan: Active Problems:   Lupus (HCC)   Hip pain   Lupus (systemic lupus erythematosus) (HCC)   Arthralgia of right hip   Soft tissue abscess   Breast mass, right   History of inferior vena caval filter placement  Right hip joint pain in the setting of multiple previous joint infections and surgeries (23 in total) s/p removal of right hip arthroplasty:  Ortho took pt to OR for I&D today w/o any overt signs of infection noted.  - orthopedics following - dilaudid 0.5mg  q2h for pain, informed patient that eventually she will need to be transitioned to oral meds -  CSW following regarding SNF and pt has a SNF willing to accept her - continue IV vancomycin per pharm - ordered foley cath - WBC unchanged today  Lupus:  -prednisone 40 mg -plaquenil 200 mg bid  - voltaren gel - gabapentin   HTN: Pt 's bp has been mildly elevated -clonidine & norvasc  Asthma: Continue albuterol, symbicort, and singulair   FEN--  Reg diet  DVT ppx-- holding 24 hours after sx  Dispo:  To SNF    The patient does not have a current PCP (No Pcp Per Patient) and does need an [facility] hospital follow-up appointment after discharge.  The patient does not have transportation limitations that hinder transportation to clinic appointments.  .Services Needed at time of discharge: Y = Yes, Blank = No PT:   OT:   RN:   Equipment:   Other:     LOS: 7 days   Norman Herrlich, MD 11/29/2015, 11:30 AM

## 2015-11-29 NOTE — Anesthesia Preprocedure Evaluation (Addendum)
Anesthesia Evaluation  Patient identified by MRN, date of birth, ID band Patient awake    Airway Mallampati: II   Neck ROM: Full    Dental   Pulmonary asthma , pneumonia, former smoker,    breath sounds clear to auscultation       Cardiovascular hypertension,  Rhythm:Regular Rate:Normal     Neuro/Psych    GI/Hepatic GERD  ,  Endo/Other  negative endocrine ROS  Renal/GU Renal disease     Musculoskeletal   Abdominal   Peds  Hematology   Anesthesia Other Findings   Reproductive/Obstetrics                            Anesthesia Physical Anesthesia Plan  ASA: III  Anesthesia Plan: General   Post-op Pain Management:    Induction: Intravenous  Airway Management Planned: Oral ETT  Additional Equipment:   Intra-op Plan:   Post-operative Plan: Extubation in OR  Informed Consent: I have reviewed the patients History and Physical, chart, labs and discussed the procedure including the risks, benefits and alternatives for the proposed anesthesia with the patient or authorized representative who has indicated his/her understanding and acceptance.   Dental advisory given  Plan Discussed with: CRNA and Anesthesiologist  Anesthesia Plan Comments:        Anesthesia Quick Evaluation

## 2015-11-29 NOTE — Progress Notes (Signed)
Patient ID: Katherine Banks, female   DOB: 1966/07/28, 50 y.o.   MRN: [id number] Medicine attending: Database reviewed. I concur with the evaluation and management plan as recorded by resident physician Dr. Julious Oka which we discussed together. Follow-up CT scan of the right hip area done yesterday was not diagnostic for recurrent abscess formation however, given clinical progression of pain and persistent leukocytosis, orthopedic surgeon elected to reexplore the area. She was taken to the operating room this morning and area of previous abscess drainage was thoroughly explored. No purulent material was encountered. The wound was washed and debrided. Patient tolerated the procedure well. White count remains elevated but unchanged. She is afebrile on vancomycin which will be continued. We began to taper her chronic high-dose steroids yesterday. Current dose 40 mg daily.

## 2015-11-30 ENCOUNTER — Encounter (HOSPITAL_COMMUNITY): Payer: Self-pay | Admitting: Orthopaedic Surgery

## 2015-11-30 DIAGNOSIS — E099 Drug or chemical induced diabetes mellitus without complications: Secondary | ICD-10-CM

## 2015-11-30 DIAGNOSIS — J45909 Unspecified asthma, uncomplicated: Secondary | ICD-10-CM | POA: Diagnosis present

## 2015-11-30 DIAGNOSIS — M8668 Other chronic osteomyelitis, other site: Secondary | ICD-10-CM | POA: Diagnosis present

## 2015-11-30 DIAGNOSIS — I10 Essential (primary) hypertension: Secondary | ICD-10-CM | POA: Diagnosis present

## 2015-11-30 DIAGNOSIS — T380X5A Adverse effect of glucocorticoids and synthetic analogues, initial encounter: Secondary | ICD-10-CM

## 2015-11-30 LAB — CBC
HCT: 26 % — ABNORMAL LOW (ref 36.0–46.0)
HEMOGLOBIN: 8.1 g/dL — AB (ref 12.0–15.0)
MCH: 24.1 pg — AB (ref 26.0–34.0)
MCHC: 31.2 g/dL (ref 30.0–36.0)
MCV: 77.4 fL — ABNORMAL LOW (ref 78.0–100.0)
Platelets: 427 10*3/uL — ABNORMAL HIGH (ref 150–400)
RBC: 3.36 MIL/uL — ABNORMAL LOW (ref 3.87–5.11)
RDW: 18.1 % — AB (ref 11.5–15.5)
WBC: 20.6 10*3/uL — ABNORMAL HIGH (ref 4.0–10.5)

## 2015-11-30 LAB — GLUCOSE, CAPILLARY
GLUCOSE-CAPILLARY: 247 mg/dL — AB (ref 65–99)
Glucose-Capillary: 211 mg/dL — ABNORMAL HIGH (ref 65–99)
Glucose-Capillary: 144 mg/dL — ABNORMAL HIGH (ref 65–99)

## 2015-11-30 MED ORDER — OXYCODONE HCL ER 15 MG PO T12A
15.0000 mg | EXTENDED_RELEASE_TABLET | Freq: Two times a day (BID) | ORAL | Status: DC
  Administered 2015-11-30 – 2015-12-01 (×3): 15 mg via ORAL
  Filled 2015-11-30 (×3): qty 1

## 2015-11-30 MED ORDER — OXYCODONE HCL 5 MG PO TABS
10.0000 mg | ORAL_TABLET | ORAL | Status: DC | PRN
Start: 2015-11-30 — End: 2015-12-01
  Administered 2015-11-30 (×2): 10 mg via ORAL
  Filled 2015-11-30 (×2): qty 2

## 2015-11-30 MED ORDER — AMLODIPINE BESYLATE 10 MG PO TABS
10.0000 mg | ORAL_TABLET | Freq: Every day | ORAL | Status: DC
  Filled 2015-11-30: qty 1

## 2015-11-30 MED ORDER — INSULIN ASPART 100 UNIT/ML ~~LOC~~ SOLN
0.0000 [IU] | Freq: Three times a day (TID) | SUBCUTANEOUS | Status: DC
  Administered 2015-11-30 – 2015-12-01 (×3): 3 [IU] via SUBCUTANEOUS

## 2015-11-30 MED ORDER — HYDROMORPHONE HCL 1 MG/ML IJ SOLN
0.5000 mg | INTRAMUSCULAR | Status: DC | PRN
  Administered 2015-11-30 – 2015-12-01 (×6): 0.5 mg via INTRAVENOUS
  Filled 2015-11-30 (×6): qty 1

## 2015-11-30 NOTE — Progress Notes (Signed)
Physical Therapy Treatment Patient Details Name: Katherine Banks MRN: [id number] DOB: Feb 02, 1966 Today's Date: 11/30/2015    History of Present Illness 50 y.o. woman with a very complicated past medical history, with SLE, DVTs sp IVC filter, lupus, cardiac stent, HTN, asthma who came in with lupus flare, and hip pain. She also has history of cardiac stent in 2013, and history of uterine cancer. 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 s/p IRRIGATION AND DEBRIDEMENT EXTREMITY/RIGHT HIP 1/24    PT Comments    Pt declined mobility OOB but agreeable to participate with supine LE therapeutic exercise. Pt remains to refuse OOB mobility secondary to pain.      Follow Up Recommendations  SNF     Equipment Recommendations  None recommended by PT    Recommendations for Other Services       Precautions / Restrictions Precautions Precautions: Fall Restrictions Weight Bearing Restrictions: No    Mobility  Bed Mobility Overal bed mobility:  (Pt refused bed mobility)                Transfers Overall transfer level:  (Pt refused transfers secondary to increased pain. )                  Ambulation/Gait Ambulation/Gait assistance:  (remains to refuse.)               Stairs            Wheelchair Mobility    Modified Rankin (Stroke Patients Only)       Balance                                    Cognition Arousal/Alertness: Awake/alert Behavior During Therapy: WFL for tasks assessed/performed Overall Cognitive Status: Within Functional Limits for tasks assessed                      Exercises General Exercises - Lower Extremity Ankle Circles/Pumps: Both;10 reps;Supine;AROM Quad Sets: Left;10 reps;Supine;AROM Heel Slides: AROM;Left;Supine Hip ABduction/ADduction: AAROM;Left;Supine Straight Leg  Raises: AAROM;Left;Supine    General Comments        Pertinent Vitals/Pain Pain Assessment: 0-10 Pain Score: 8  Pain Location: R hip Pain Descriptors / Indicators: Constant Pain Intervention(s): Monitored during session    Home Living                      Prior Function            PT Goals (current goals can now be found in the care plan section) Acute Rehab PT Goals Patient Stated Goal: get back to normalcy Potential to Achieve Goals: Fair Progress towards PT goals: Not progressing toward goals - comment (Pt remains to self limit mobility secondary to increased pain.  )    Frequency  Min 3X/week    PT Plan Current plan remains appropriate (will continue current plan pending patient participation.  )    Co-evaluation             End of Session   Activity Tolerance: Patient limited by pain Patient left: in bed;with bed alarm set;with call bell/phone within reach     Time: [id number] PT Time Calculation (min) (ACUTE ONLY): 15 min  Charges:  $Therapeutic Exercise: 8-22 mins  G Codes:      Cristela Blue 11/30/2015, 12:24 PM  Nickolai Rinks Stann Mainland, PTA

## 2015-11-30 NOTE — Progress Notes (Signed)
Inpatient Diabetes Program Recommendations  AACE/ADA: New Consensus Statement on Inpatient Glycemic Control (2015)  Target Ranges:  Prepandial:   less than 140 mg/dL      Peak postprandial:   less than 180 mg/dL (1-2 hours)      Critically ill patients:  140 - 180 mg/dL  Results for Katherine Banks, Katherine Banks (MRN [id number]) as of 11/30/2015 10:39  Ref. Range 11/25/2015 04:40 11/26/2015 09:30 11/27/2015 06:57 11/28/2015 05:20 11/29/2015 05:19  Glucose Latest Ref Range: 65-99 mg/dL 156 (H) 196 (H) 179 (H) 170 (H) 130 (H)   Results for ZYON, COZZENS (MRN [id number]) as of 11/30/2015 10:39  Ref. Range 11/22/2015 11:40  Hemoglobin A1C Latest Ref Range: 4.8-5.6 % 6.7 (H)   Review of Glycemic Control  Diabetes history: No Outpatient Diabetes medications: NA Current orders for Inpatient glycemic control: None  Inpatient Diabetes Program Recommendations: Insulin-Correction: While inpatient and ordered steroids, please consider ordering CBGs with Novolog correction scale. HgbA1C: Patient does not have a docomented history of diabetes. A1C 6.7% on 11/22/15 which meets ADA criteria for DM dx. Patient takes steroids as an outpatient which could have caused steroid induced diabetes. MD, please indicate if patient will be diagnosed with DM at this time or if patient will be asked to follow up as an outpatient regarding glycemic control.   Thanks, Barnie Alderman, RN, MSN, CDE Diabetes Coordinator Inpatient Diabetes Program [phone] (Team Pager from [place] to [place]) [phone] ([facility] office) [phone] [facility] office) [phone] [facility] office)

## 2015-11-30 NOTE — Progress Notes (Signed)
Patient seen and examined. Case d/w residents in detail. I agree with findings and plan as documented in Dr. Sherlynn Carbon note.  Patient still with persistent R hip pain. No fevers. Leukocytosis is persistent as well. Will c/w IV vanco for now. Ortho f/u appreciated. She is s/p repeat I and D of R hip yesterday with no evidence of purulence. Wash out of joint space was done again. Will c/w pain control and PT for now. Will attempt to wean off long acting oxy and dilaudid over the next 1-2 days and control pain with oxy IR alone. C/w home meds for lupus. Likely d/c to SNF later this week.

## 2015-11-30 NOTE — Progress Notes (Signed)
Attempted to see pt but pt stated she was in way too much pain to do anything at all.  Nurse present giving IV pain meds and pt cont to refuse.  Pt asked for therapist to return after lunch to get her into chair.  Will attempt back as schedule allows. Katherine Banks, [place] [id number]

## 2015-11-30 NOTE — Progress Notes (Signed)
Physical Therapy Treatment Patient Details Name: Katherine Banks MRN: [id number] DOB: [birth year] Today's Date: 11/30/2015    History of Present Illness 50 y.o. woman with a very complicated past medical history, with SLE, DVTs sp IVC filter, lupus, cardiac stent, HTN, asthma who came in with lupus flare, and hip pain. She also has history of cardiac stent in 2013, and history of uterine cancer. 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 s/p IRRIGATION AND DEBRIDEMENT EXTREMITY/RIGHT HIP 1/24    PT Comments    Pt making progress with mobility.  Follow Up Recommendations  SNF     Equipment Recommendations  None recommended by PT    Recommendations for Other Services       Precautions / Restrictions Precautions Precautions: Fall Restrictions Weight Bearing Restrictions: No    Mobility  Bed Mobility Overal bed mobility: Needs Assistance Bed Mobility: Supine to Sit     Supine to sit: Min assist;HOB elevated     General bed mobility comments: Assist to bring RLE off bed. Pt with heavy use of rail.  Transfers Overall transfer level: Needs assistance Equipment used: Rolling walker (2 wheeled) Transfers: Sit to/from Stand Sit to Stand: Min assist         General transfer comment: Assist to bring hips up.  Ambulation/Gait Ambulation/Gait assistance: Min assist Ambulation Distance (Feet): 10 Feet (10' x 1, 5' x 1) Assistive device: Rolling walker (2 wheeled) Gait Pattern/deviations: Step-to pattern (hop to) Gait velocity: decr Gait velocity interpretation: Below normal speed for age/gender General Gait Details: Pt using hop to gait with RLE only used for balance. Sliding rt foot forward   Stairs            Wheelchair Mobility    Modified Rankin (Stroke Patients Only)       Balance Overall balance assessment: Needs  assistance Sitting-balance support: No upper extremity supported;Feet supported Sitting balance-Leahy Scale: Normal     Standing balance support: Bilateral upper extremity supported Standing balance-Leahy Scale: Poor Standing balance comment: Support of walker                     Cognition Arousal/Alertness: Awake/alert Behavior During Therapy: WFL for tasks assessed/performed Overall Cognitive Status: Within Functional Limits for tasks assessed                      Exercises      General Comments        Pertinent Vitals/Pain Pain Assessment: Faces Faces Pain Scale: Hurts even more Pain Location: rt hip Pain Descriptors / Indicators: Constant Pain Intervention(s): Limited activity within patient's tolerance;Monitored during session    Home Living                      Prior Function            PT Goals (current goals can now be found in the care plan section) Progress towards PT goals: Progressing toward goals    Frequency  Min 3X/week    PT Plan Current plan remains appropriate    Co-evaluation             End of Session   Activity Tolerance: Patient limited by pain Patient left: in chair;with call bell/phone within reach (pt sitting at sink starting bath)     Time: [id number] PT Time Calculation (min) (ACUTE  ONLY): 15 min  Charges:  $Gait Training: 8-22 mins                    G Codes:      Felisha Claytor December 08, 2015, 4:33 PM [facility] PT [phone]

## 2015-11-30 NOTE — Progress Notes (Signed)
Patient ID: Katherine Banks, female   DOB: 28-Jul-1966, 50 y.o.   MRN: [id number] Repeat I&D of right hip went well yesterday morning.  I did not find any other pockets of abscess, just serous-anginous fluid and I was able to thoroughly wash out the space again.  I do not anticipate the need for any further surgery on the right hip during this hospitalization.

## 2015-11-30 NOTE — Progress Notes (Addendum)
UR COMPLETED  

## 2015-11-30 NOTE — Progress Notes (Signed)
Subjective:  Patient seen and examined at bedside. She underwent re-exploration and wash out of right hip yesterday, and no clear site of abscess was found  Says her pain is better than yesterday, and she was satisfied about current management. She knows that she needs prolonged course of IV antibiotics.  We changed some of her pain med regimen and pt agreeable to that.   Objective: Vital signs in last 24 hours: Filed Vitals:   11/29/15 1354 11/29/15 2111 11/30/15 0637 11/30/15 0842  BP: 155/72 156/72 147/78 150/82  Pulse: 82 77 62   Temp: 97.5 F (36.4 C) 97.9 F (36.6 C) 98 F (36.7 C)   TempSrc:  Oral    Resp: 19 18 16    Height:      Weight:      SpO2: 100% 100% 100%    Weight change:   Intake/Output Summary (Last 24 hours) at 11/30/15 1125 Last data filed at 11/30/15 1012  Gross per 24 hour  Intake    540 ml  Output   2750 ml  Net  -2210 ml   General: Vital signs reviewed. Patient lying in bed  Cardiovascular: Reg rate , Reg rhythm, no murmur appreciated  Pulmonary/Chest: Clear to auscultation bilaterally, no wheezes Abdominal: Soft, obese, nontender to palpation  Hip: Right hip has a 4x4 on it- no drainage around it.  Extremities: pulses intact   Lab Results: Results for orders placed or performed during the hospital encounter of 11/19/15 (from the past 24 hour(s))  CBC     Status: Abnormal   Collection Time: 11/30/15  5:31 AM  Result Value Ref Range   WBC 20.6 (H) 4.0 - 10.5 K/uL   RBC 3.36 (L) 3.87 - 5.11 MIL/uL   Hemoglobin 8.1 (L) 12.0 - 15.0 g/dL   HCT 26.0 (L) 36.0 - 46.0 %   MCV 77.4 (L) 78.0 - 100.0 fL   MCH 24.1 (L) 26.0 - 34.0 pg   MCHC 31.2 30.0 - 36.0 g/dL   RDW 18.1 (H) 11.5 - 15.5 %   Platelets 427 (H) 150 - 400 K/uL    Micro Results: Recent Results (from the past 240 hour(s))  Anaerobic culture     Status: None   Collection Time: 11/23/15  1:03 PM  Result Value Ref Range Status   Specimen Description ABSCESS RIGHT HIP  Final     Special Requests NONE  Final   Gram Stain   Final    ABUNDANT WBC PRESENT,BOTH PMN AND MONONUCLEAR NO SQUAMOUS EPITHELIAL CELLS SEEN ABUNDANT GRAM POSITIVE COCCI IN PAIRS IN CLUSTERS Performed at [facility] Performed at [facility]    Culture   Final    NO ANAEROBES ISOLATED Performed at [facility]    Report Status 11/28/2015 FINAL  Final  Gram stain     Status: None   Collection Time: 11/23/15  1:03 PM  Result Value Ref Range Status   Specimen Description ABSCESS RIGHT HIP  Final   Special Requests NONE  Final   Gram Stain   Final    ABUNDANT WBC PRESENT, PREDOMINANTLY PMN ABUNDANT GRAM POSITIVE COCCI IN PAIRS IN CLUSTERS    Report Status 11/23/2015 FINAL  Final  Culture, routine-abscess     Status: None   Collection Time: 11/23/15  1:03 PM  Result Value Ref Range Status   Specimen Description ABSCESS RIGHT HIP  Final   Special Requests NONE  Final   Gram Stain   Final    ABUNDANT  WBC PRESENT,BOTH PMN AND MONONUCLEAR NO SQUAMOUS EPITHELIAL CELLS SEEN ABUNDANT GRAM POSITIVE COCCI IN PAIRS IN CLUSTERS Performed at [facility] Performed at [facility]    Culture   Final    ABUNDANT METHICILLIN RESISTANT STAPHYLOCOCCUS AUREUS Note: RIFAMPIN AND GENTAMICIN SHOULD NOT BE USED AS SINGLE DRUGS FOR TREATMENT OF STAPH INFECTIONS. CRITICAL RESULT CALLED TO, READ BACK BY AND VERIFIED WITH: STEPHANIE ROGERS 11/26/15 0955 BY SMITHERSJ Performed at [facility]    Report Status 11/26/2015 FINAL  Final   Organism ID, Bacteria METHICILLIN RESISTANT STAPHYLOCOCCUS AUREUS  Final      Susceptibility   Methicillin resistant staphylococcus aureus - MIC*    CLINDAMYCIN <=0.25 SENSITIVE Sensitive     ERYTHROMYCIN 0.5 SENSITIVE Sensitive     GENTAMICIN <=0.5 SENSITIVE Sensitive     LEVOFLOXACIN 0.25 SENSITIVE Sensitive     OXACILLIN >=4 RESISTANT Resistant     RIFAMPIN <=0.5 SENSITIVE Sensitive     TRIMETH/SULFA <=10 SENSITIVE Sensitive      VANCOMYCIN 1 SENSITIVE Sensitive     TETRACYCLINE <=1 SENSITIVE Sensitive     * ABUNDANT METHICILLIN RESISTANT STAPHYLOCOCCUS AUREUS  Surgical pcr screen     Status: Abnormal   Collection Time: 11/24/15  5:00 AM  Result Value Ref Range Status   MRSA, PCR POSITIVE (A) NEGATIVE Final    Comment: RESULT CALLED TO, READ BACK BY AND VERIFIED WITH: KRUSSELL @0726  11/24/15 MKELLY    Staphylococcus aureus POSITIVE (A) NEGATIVE Final    Comment:        The Xpert SA Assay (FDA approved for NASAL specimens in patients over 33 years of age), is one component of a comprehensive surveillance program.  Test performance has been validated by [facility] for patients greater than or equal to 60 year old. It is not intended to diagnose infection nor to guide or monitor treatment.   Surgical pcr screen     Status: Abnormal   Collection Time: 11/29/15  5:54 AM  Result Value Ref Range Status   MRSA, PCR POSITIVE (A) NEGATIVE Final    Comment: RESULT CALLED TO, READ BACK BY AND VERIFIED WITH: T JETER 11/29/15 @ 0825 M VESTAL    Staphylococcus aureus POSITIVE (A) NEGATIVE Final    Comment:        The Xpert SA Assay (FDA approved for NASAL specimens in patients over 46 years of age), is one component of a comprehensive surveillance program.  Test performance has been validated by [facility] for patients greater than or equal to 76 year old. It is not intended to diagnose infection nor to guide or monitor treatment.    Studies/Results: Ct Hip Right Wo Contrast  11/28/2015  CLINICAL DATA:  Right hip pain. History of lupus with numerous right hip surgeries due to recurrent infection. EXAM: CT OF THE RIGHT HIP WITHOUT CONTRAST TECHNIQUE: Multidetector CT imaging of the right hip was performed according to the standard protocol. Multiplanar CT image reconstructions were also generated. COMPARISON:  11/19/2015 FINDINGS: The patient had incision and drainage of the right hip joint yielding  gross purulent material and deep soft tissue space infection on 11/24/2015. Prior Girdlestone procedure. If on today' s exam in the subcutaneous tissues there is some gas and fluid along the wound site, collection measuring 3.9 by 3.4 cm on image 20 series 4. Tiny locules of gas are also present along the deeper gluteus medius muscle and not inconsistent with recent operative intervention. There is reduction in the fluid density in the  right hip joint as well as reduction in the space between the femur and the acetabulum, previously 8.8 cm and currently 7.2 cm, reflecting removal of some of the volume of material within the joint. There is still a band of low density posteriorly in the joint for example on image 35 series 4, and extensive soft tissue density within the joint itself compatible with phlegmon. Several small locules of gas are present posterolateral to the proximal femur as on image 43 of series 4. There is some increase in the soft tissue density posterolateral to the right proximal femur for example on image 41 series 4, compared to 11/19/15, probably connecting to the joint and possibly reflecting trochanteric bursa. My understanding from the operative note is that the anterior approach was utilized but not the prior posterior incision site. 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 No new bony abnormality. Reactive right external iliac nodes are present. No sciatic notch impingement. No definite obturator impingement although there is some indistinctness of the tissues along the hip adductor musculature similar to prior. 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 There is a curvilinear 1.3 by 0.5 cm bony fragment in the central cavity along the proximal right femur, bony sequestrum not excluded. IMPRESSION: 1. Purulent  collection from the right hip joint has been drained, with resulting reduced volume in the right hip joint. Small collections of gas and fluid track along the operative wound, at 4 days postop these could be incidental non infected collections and are not considered specific for abscess. Small loculations of gas are also noted along the gluteus medius muscle and posterolateral to the right hip. There is some increase in the soft tissue density posterolateral to the right hip likely reflecting phlegmon or possibly blood products distending this space. I suspect that there is continuity of the joint and this space posterolateral to the remaining proximal femur. 2. Prior Girdlestone procedure, with stable bony deformities. Proximal femoral appearance suggests chronic osteomyelitis. Possible bony sequestrum in the proximal right femur. Electronically Signed   By: Van Clines M.D.   On: 11/28/2015 11:52   Medications: I have reviewed the patient's current medications. Scheduled Meds: . amLODipine  5 mg Oral Daily  . bacitracin   Topical BID  . budesonide-formoterol  2 puff Inhalation BID  . cloNIDine  0.2 mg Oral BID  . erythromycin   Right Eye 3 times per day  . feeding supplement (ENSURE ENLIVE)  237 mL Oral Q24H  . gabapentin  300 mg Oral TID  . hydroxychloroquine  200 mg Oral BID  . montelukast  10 mg Oral Daily  . multivitamin with minerals  1 tablet Oral Daily  . oxyCODONE  15 mg Oral Q12H  . polyethylene glycol  17 g Oral Daily  . predniSONE  40 mg Oral Q breakfast  . senna-docusate  2 tablet Oral BID  . vancomycin  1,250 mg Intravenous Q12H  . 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 Pt is on chronic prednisone,  WC has been around 18 since admission.  Blood cultures NGTD.  Patient underwent ct guided aspiration of her right hip on 1/23, and the fluid analysis showed turbid and brown fluid. Gram stain showed abundant gram positive cocci in pairs in clusters.  She is MRSA positive.  She underwent I&d of right hip 11/24/2015. Culture growing methicillin resistant Staph aureus, sensitive to both vancomycin and bactrim.  No anaerobic growth. White count has been persistently elevated. As pt complaining of more pain on 1/27 and worsening leukocytosis, we re-consulted orthopaedics Dr Ninfa Linden. The leukocytosis may be from the chronic prednisone use.  On 1/28, the WC was stable at 40, and we decided to order a CT to show if there is any abscess present. CT did not reveal any source. 1/29, pt went for re-exploration and washout of the area and no clear source was found.  -IV vanc per pharm -pain control Oxycontin 15 mg BID, and Oxy 10 mg IR q4 hours for breakthrough pain -dilaudid 0.5 mg q4 PRN, will eventually wean her off from dilaudid  -orthopedics consulted and following  -consulted ID and following for management of long term antibiotics -consulted CSW regarding SNF    Lupus: likely not a lupus flare. 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  The patient does not have transportation limitations that hinder transportation to clinic appointments.  .Services Needed at time of discharge: Y = Yes, Blank = No PT:   OT:   RN:   Equipment:   Other:     LOS: 8 days   [name], MD 11/30/2015, 11:25 AM

## 2015-12-01 LAB — VANCOMYCIN, TROUGH: Vancomycin Tr: 37 ug/mL (ref 10.0–20.0)

## 2015-12-01 LAB — CREATININE, SERUM
CREATININE: 1.02 mg/dL — AB (ref 0.44–1.00)
GFR calc Af Amer: >60 mL/min (ref >60)

## 2015-12-01 LAB — GLUCOSE, CAPILLARY
Glucose-Capillary: 81 mg/dL (ref 65–99)
Glucose-Capillary: 215 mg/dL — ABNORMAL HIGH (ref 65–99)

## 2015-12-01 MED ORDER — HEPARIN SOD (PORK) LOCK FLUSH 100 UNIT/ML IV SOLN
500.0000 [IU] | INTRAVENOUS | Status: DC | PRN
  Administered 2015-12-01: 500 [IU]
  Filled 2015-12-01 (×2): qty 5

## 2015-12-01 MED ORDER — VANCOMYCIN HCL 10 G IV SOLR: 1250.0000 mg | Freq: Two times a day (BID) | INTRAVENOUS | Status: AC

## 2015-12-01 MED ORDER — AMLODIPINE BESYLATE 10 MG PO TABS: 10.0000 mg | ORAL_TABLET | Freq: Every day | ORAL | Status: DC

## 2015-12-01 MED ORDER — HEPARIN SOD (PORK) LOCK FLUSH 100 UNIT/ML IV SOLN
500.0000 [IU] | INTRAVENOUS | Status: DC
  Filled 2015-12-01: qty 5

## 2015-12-01 MED ORDER — OXYCODONE HCL 10 MG PO TABS: 10.0000 mg | ORAL_TABLET | ORAL | Status: DC | PRN

## 2015-12-01 MED ORDER — PREDNISONE 20 MG PO TABS: 40.0000 mg | ORAL_TABLET | Freq: Every day | ORAL | Status: DC

## 2015-12-01 MED ORDER — OXYCODONE HCL 5 MG PO TABS
10.0000 mg | ORAL_TABLET | ORAL | Status: DC | PRN
  Administered 2015-12-01: 10 mg via ORAL
  Filled 2015-12-01: qty 2

## 2015-12-01 MED ORDER — MENTHOL 3 MG MT LOZG: 1.0000 | LOZENGE | OROMUCOSAL | Status: DC | PRN

## 2015-12-01 MED ORDER — VANCOMYCIN HCL 10 G IV SOLR: 1250.0000 mg | Freq: Two times a day (BID) | INTRAVENOUS | Status: DC

## 2015-12-01 MED ORDER — SENNOSIDES-DOCUSATE SODIUM 8.6-50 MG PO TABS: 2.0000 | ORAL_TABLET | Freq: Two times a day (BID) | ORAL | Status: DC

## 2015-12-01 MED ORDER — LIVING WELL WITH DIABETES BOOK
Freq: Once | Status: AC
  Administered 2015-12-01: 13:00:00
  Filled 2015-12-01: qty 1

## 2015-12-01 NOTE — Clinical Social Work Placement (Signed)
   CLINICAL SOCIAL WORK PLACEMENT  NOTE  Date:  12/01/2015  Patient Details  Name: Katherine Banks MRN: [id number] Date of Birth: 1966/05/03  Clinical Social Work is seeking post-discharge placement for this patient at the [place] level of care (*CSW will initial, date and re-position this form in  chart as items are completed):  Yes   Patient/family provided with [facility] Work Department's list of facilities offering this level of care within the geographic area requested by the patient (or if unable, by the patient's family).  Yes   Patient/family informed of their freedom to choose among providers that offer the needed level of care, that participate in Medicare, Medicaid or managed care program needed by the patient, have an available bed and are willing to accept the patient.  Yes   Patient/family informed of [facility]'s ownership interest in [facility] and [facility], as well as of the fact that they are under no obligation to receive care at these facilities.  PASRR submitted to EDS on 11/24/15     PASRR number received on 11/24/15     Existing PASRR number confirmed on       FL2 transmitted to all facilities in geographic area requested by pt/family on 11/24/15     FL2 transmitted to all facilities within larger geographic area on       Patient informed that his/her managed care company has contracts with or will negotiate with certain facilities, including the following:        Yes   Patient/family informed of bed offers received.  Patient chooses bed at [facility]     Physician recommends and patient chooses bed at      Patient to be transferred to [facility] on 12/01/15.  Patient to be transferred to facility by [facility]     Patient family notified on 12/01/15 of transfer.  Name of family member notified:        PHYSICIAN       Additional Comment:     _______________________________________________ Benard Halsted, [place] 12/01/2015, 1:37 PM

## 2015-12-01 NOTE — Progress Notes (Signed)
Patient seen and examined. Case d/w residents in detail. I agree with findings and plan as documented in Dr. Sherlynn Carbon note.  Patient feels much better today. Pain is better controlled. C/w IV vancomycin to complete 6 week course. Patient to f/u with ID as an outpatient to determine if she will need chronic suppressive therapy given recurrent MRSA hip infections. Ortho f/u as outpatient. D/c planning to SNF once bed available.  C/w home meds for lupus.

## 2015-12-01 NOTE — Progress Notes (Signed)
Informed Dr Tiburcio Pea of vanc trough 37

## 2015-12-01 NOTE — Progress Notes (Signed)
Pharmacy Antibiotic Follow-up Note  Katherine Banks is a 50 y.o. year-old female admitted on 11/19/2015.  The patient is currently on day 8 of vancomycin for osteomyelitis. A trough drawn today was 37 mcg/mL, which is supratherapeutic. The trough was drawn at an appropriate time. The patient's current dose of 1250 mg q12h has been discontinued. Patient is to be discharged to [facility] today for 6 weeks of vancomycin. A stat creatinine drawn was 1.02 mg/dL (previous was 0.84) so pt does not meet AKI criteria.   Assessment/Plan: Hold further vanc Will need random vanc level on 2/1 to determine when dosing is appropriate - Patient probably needs 1g q12h or 1500 mg q24h Spoke with Dr. Marvel Plan of IMTS to discuss changing the discharge orders to hold further vanc and obtain a random level at the SNF Spoke with Dillard Essex, IV pharmacist at [facility] to discuss case  Temp (24hrs), Avg:97.8 F (36.6 C), Min:97.4 F (36.3 C), Max:98.1 F (36.7 C)   Recent Labs Lab 11/26/15 0930 11/27/15 0657 11/28/15 0520 11/29/15 0519 11/30/15 0531  WBC 18.3* 19.6* 19.6* 19.3* 20.6*     Recent Labs Lab 11/26/15 0930 11/27/15 0657 11/28/15 0520 11/29/15 0519 12/01/15 1500  CREATININE 0.89 0.87 0.87 0.84 1.02*   Estimated Creatinine Clearance: 84.5 mL/min (by C-G formula based on Cr of 1.02).     Antimicrobials this admission: Vanc 1/23>>  Levels/dose changes this admission: 1/26 VT = 21 on 1g IV q12h. 1/28 VT = 8 on 750 mg q12h   Microbiology results: 1/19 Blood: Neg 1/23 R hip abscess: abundant MRSA 1/24 MRSA PCR: pos  Levester Fresh, PharmD, BCPS, [facility] Clinical Pharmacist Pager [phone] 12/01/2015 4:09 PM

## 2015-12-01 NOTE — Progress Notes (Addendum)
Inpatient Diabetes Program Recommendations  AACE/ADA: New Consensus Statement on Inpatient Glycemic Control (2015)  Target Ranges:  Prepandial:   less than 140 mg/dL      Peak postprandial:   less than 180 mg/dL (1-2 hours)      Critically ill patients:  140 - 180 mg/dL  Results for JAMIA, ABAYA (MRN [id number]) as of 12/01/2015 15:29  Ref. Range 11/22/2015 11:40  Hemoglobin A1C Latest Ref Range: 4.8-5.6 % 6.7 (H)   Note that patient has elevated A1C and has a diagnosis of "steroid" induced diabetes.  Spoke with patient briefly and she states that she is aware of high blood sugars from her steroids noting that "this is not new".  Patient was given "Living Well with Diabetes" booklet.  Patient was appreciative of information.  May need oral medications started as outpatient due to elevated A1C and blood sugars.  Encouraged patient to follow-up with PCP regarding A1C.  She verbalized understanding and states, I am used to my blood sugars going up in the hospital.  Thanks, Adah Perl, RN, BC-ADM Inpatient Diabetes Coordinator Pager [phone] (8a-5p)

## 2015-12-01 NOTE — Progress Notes (Signed)
Occupational Therapy Treatment Patient Details Name: Katherine Banks MRN: [id number] DOB: Jul 23, 1966 Today's Date: 12/01/2015    History of present illness 50 y.o. woman with a very complicated past medical history, with SLE, DVTs sp IVC filter, lupus, cardiac stent, HTN, asthma who came in with lupus flare, and hip pain. She also has history of cardiac stent in 2013, and history of uterine cancer. 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 s/p IRRIGATION AND DEBRIDEMENT EXTREMITY/RIGHT HIP 1/24   OT comments  Pt making steady gains in ADL transfers for toileting, grooming and LB dressing. Pt is motivated to return to being able to live independently in her own home eventually.   Follow Up Recommendations  SNF    Equipment Recommendations       Recommendations for Other Services      Precautions / Restrictions Precautions Precautions: Fall Restrictions Weight Bearing Restrictions: No       Mobility Bed Mobility      General bed mobility comments: pt in chair  Transfers Overall transfer level: Needs assistance Equipment used: Rolling walker (2 wheeled) Transfers: Sit to/from Stand Sit to Stand: Min guard         General transfer comment: min guard from recliner, min from toilet    Balance Overall balance assessment: Needs assistance Sitting-balance support: No upper extremity supported Sitting balance-Leahy Scale: Normal     Standing balance support: Bilateral upper extremity supported Standing balance-Leahy Scale: Poor Standing balance comment: using rw                   ADL Overall ADL's : Needs assistance/impaired     Grooming: Min guard;Wash/dry hands;Standing               Lower Body Dressing: Sit to/from stand;Minimal assistance Lower Body Dressing Details (indicate cue type and reason): pt is able to don and  doff socks and start pants over feet in long sitting in recliner without AE  Toilet Transfer: Ambulation;RW;Minimal assistance   Toileting- Clothing Manipulation and Hygiene: Sit to/from stand;Minimal assistance Toileting - Clothing Manipulation Details (indicate cue type and reason): assisted to keep gown out of toilet     Functional mobility during ADLs: Min guard;Rolling walker General ADL Comments: Pt is eager to get out of hospital. Pt is due to have a new w/c in February.  Recommended pt consider a motorized w/c .      Vision                     Perception     Praxis      Cognition   Behavior During Therapy: WFL for tasks assessed/performed Overall Cognitive Status: Within Functional Limits for tasks assessed                       Extremity/Trunk Assessment               Exercises     Shoulder Instructions       General Comments      Pertinent Vitals/ Pain       Pain Assessment: Faces Faces Pain Scale: Hurts even more Pain Location: R hip Pain Descriptors / Indicators: Grimacing;Guarding Pain Intervention(s): Monitored during session;Premedicated before session;Repositioned  Home Living  Prior Functioning/Environment              Frequency Min 2X/week     Progress Toward Goals  OT Goals(current goals can now be found in the care plan section)  Progress towards OT goals: Progressing toward goals  Acute Rehab OT Goals Patient Stated Goal: live on her own Time For Goal Achievement: 12/08/15  Plan Discharge plan remains appropriate    Co-evaluation                 End of Session Equipment Utilized During Treatment: Gait belt;Rolling walker   Activity Tolerance Patient tolerated treatment well   Patient Left in chair;with call bell/phone within reach;with nursing/sitter in room   Nurse Communication          Time: [id number] OT Time Calculation (min):  23 min  Charges: OT General Charges $OT Visit: 1 Procedure OT Treatments $Self Care/Home Management : 23-37 mins  Malka So 12/01/2015, 1:13 PM  [phone]

## 2015-12-01 NOTE — Discharge Summary (Signed)
Name: Katherine Banks MRN: [id number] DOB: [birth year] 50 y.o. PCP: No Pcp Per Patient  Date of Admission: 11/19/2015  9:20 AM Date of Discharge: 12/01/2015 Attending Physician: Aldine Contes, MD  Discharge  Diagnosis: 1. 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 ([name]) Active Problems:   Lupus (systemic lupus erythematosus) ([name])   History of inferior vena caval filter placement   HTN (hypertension)   Asthma  Discharge Medications:   Medication List    STOP taking these medications        hyoscyamine 0.125 MG SL tablet  Commonly known as:  LEVSIN SL     ibuprofen 800 MG tablet  Commonly known as:  ADVIL,MOTRIN     lisinopril 40 MG tablet  Commonly known as:  PRINIVIL,ZESTRIL     phentermine 37.5 MG tablet  Commonly known as:  ADIPEX-P     traMADol 50 MG tablet  Commonly known as:  ULTRAM     triamcinolone cream 0.1 %  Commonly known as:  KENALOG      TAKE these medications        albuterol (2.5 MG/3ML) 0.083% nebulizer solution  Commonly known as:  PROVENTIL  Take 2.5 mg by nebulization every 8 (eight) hours.     amLODipine 10 MG tablet  Commonly known as:  NORVASC  Take 1 tablet (10 mg total) by mouth daily.     cloNIDine 0.2 MG tablet  Commonly known as:  CATAPRES  Take 0.2 mg by mouth 2 (two) times daily.     clotrimazole-betamethasone cream  Commonly known as:  LOTRISONE  Apply 1 application topically 2 (two) times daily.     colesevelam 625 MG tablet  Commonly known as:  WELCHOL  Take 1,250 mg by mouth daily as needed.     cyanocobalamin 500 MCG tablet  Take 500 mcg by mouth daily.     cyclobenzaprine 10 MG tablet  Commonly known as:  FLEXERIL  Take 10 mg by mouth every 8 (eight) hours as needed for muscle spasms.     diclofenac sodium 1 % Gel  Commonly known as:  VOLTAREN  Apply 1 application topically daily as needed.     dicyclomine 20 MG tablet    Commonly known as:  BENTYL  Take 20 mg by mouth 2 (two) times daily as needed.     diphenhydrAMINE 25 MG tablet  Commonly known as:  SOMINEX  Take 25 mg by mouth at bedtime as needed for allergies or sleep.     docusate sodium 100 MG capsule  Commonly known as:  COLACE  Take 100 mg by mouth daily.     fluticasone 50 MCG/ACT nasal spray  Commonly known as:  FLONASE  Place 1 spray into both nostrils daily.     gabapentin 300 MG capsule  Commonly known as:  NEURONTIN  Take 300 mg by mouth 3 (three) times daily.     hydroxychloroquine 200 MG tablet  Commonly known as:  PLAQUENIL  Take 200 mg by mouth 2 (two) times daily.     montelukast 10 MG tablet  Commonly known as:  SINGULAIR  Take 10 mg by mouth daily.     multivitamin-iron-minerals-folic acid Tabs tablet  Take 1 tablet by mouth daily.     omeprazole 20 MG capsule  Commonly known as:  PRILOSEC  Take 20 mg by mouth 2 (two) times daily before a meal.     ondansetron 4 MG  disintegrating tablet  Commonly known as:  ZOFRAN-ODT  Take 4 mg by mouth every 6 (six) hours as needed.     Oxycodone HCl 10 MG Tabs  Take 1 tablet (10 mg total) by mouth every 3 (three) hours as needed for moderate pain, severe pain or breakthrough pain.     predniSONE 20 MG tablet  Commonly known as:  DELTASONE  Take 2 tablets (40 mg total) by mouth daily with breakfast.     senna-docusate 8.6-50 MG tablet  Commonly known as:  Senokot-S  Take 2 tablets by mouth 2 (two) times daily.     SYMBICORT 80-4.5 MCG/ACT inhaler  Generic drug:  budesonide-formoterol  Take 2 puffs by mouth 2 (two) times daily.     vancomycin 1,250 mg in sodium chloride 0.9 % 250 mL  Inject 1,250 mg into the vein every 12 (twelve) hours. End date 2/27, or per Infectious disease protocol     zolpidem 10 MG tablet  Commonly known as:  AMBIEN  Take 10 mg by mouth at bedtime.        Disposition and follow-up:   [name] was discharged from [facility] in Good condition.  At the hospital follow up visit please address:  1.  Right hip abscess- needs prolonged IV vancomycin, and referral to ID for management of long term antibiotics  IV vanc start date 1/23, for 6 weeks and stop date 2/27, or per ID     [name], the ID clinic will contact the pt to set up an appt   Lupus flare- A prednisone taper has been started - she was on 60 mg daily, and on 1/28 she has been put on 40 mg daily  She needs rheumatology referral  Left breast mass: needs obgyn referral and follow up mammogram   IVC Filter: needs removal of existing IVC filter   Anemia: outpatient follow up      2.  Labs / imaging needed at time of follow-up: CBC, anti ds-DNA, complement levels   3.  Pending labs/ test needing follow-up:   Follow-up Appointments:     Follow-up Information    Follow up with [name], MD On 12/07/2015.   Specialty:  Internal Medicine   Why:  one time hospital follow up appt at 3:15 PM   Contact information:   [place] [address] [phone]       Follow up with [facility] On 12/15/2015.   Why:    Follow up hospital visit and to establish PCP scheduled appointment  12/15/2015 at 10am, [name] NP    Contact information:   [place] [phone]       [place] [facility]. THEY ARE FOLLOWING REGARDING IV ANTIBIOTICS               Discharge Instructions: Discharge Instructions    Diet - low sodium heart healthy    Complete by:  As directed      Increase activity slowly    Complete by:  As directed            Consultations:    Procedures Performed:  Dg Chest 1 View  11/19/2015  CLINICAL DATA:  Port-A-Cath placement.  Systemic lupus erythematosus EXAM: CHEST 1 VIEW COMPARISON:  Study obtained earlier in the day FINDINGS: Port-A-Cath tip at cavoatrial  junction. No pneumothorax. Lungs clear. Heart size and pulmonary vascularity  normal. No adenopathy. No bone lesions. IMPRESSION: Port-A-Cath tip at cavoatrial junction. No pneumothorax. Lungs clear. Electronically Signed   By: Lowella Grip III M.D.   On: 11/19/2015 11:31   Dg Chest 2 View  11/19/2015  CLINICAL DATA:  Shortness of breath and chest pain for 2 days EXAM: CHEST  2 VIEW COMPARISON:  May 18, 2011 FINDINGS: Port-A-Cath tip is at the cavoatrial junction. No pneumothorax. There is mild scarring in the left upper lobe. The lungs elsewhere clear. Heart size and pulmonary vascularity are normal. No adenopathy. No bone lesions. IMPRESSION: Port-A-Cath tip at cavoatrial junction. No pneumothorax. No edema or consolidation. Mild scarring left upper lobe, stable. Electronically Signed   By: Lowella Grip III M.D.   On: 11/19/2015 09:21   Mr Hip Right W Wo Contrast  11/20/2015  CLINICAL DATA:  Patient had right hip replacement which was subsequently removed secondary to infection. Extreme pain in the right hip. EXAM: MRI OF THE RIGHT HIP WITHOUT AND WITH CONTRAST TECHNIQUE: Multiplanar, multisequence MR imaging was performed both before and after administration of intravenous contrast. CONTRAST:  5mL MULTIHANCE GADOBENATE DIMEGLUMINE 529 MG/ML IV SOLN COMPARISON:  CT right hip 11/19/2015 FINDINGS: Bones: Evidence of prior right arthroplasty with subsequent removal of the orthopedic hardware with postsurgical changes in the proximal right femur. There is a large amount of fluid in the residual right hip joint with a tract of fluid extending posterior to the greater trochanters towards the iliotibial band with thick peripheral enhancement and a fluid fluid level. No left hip fracture or dislocation. Subchondral serpiginous signal abnormality in the superior left femoral head most consistent with avascular necrosis without articular surface collapse or surrounding edema. Normal sacrum and sacroiliac  joints. Articular cartilage Articular cartilage:  No focal chondral defect of the left hip. Joint or bursal effusion Joint effusion:  No left joint effusion. Bursae:  No bursa formation. Muscles and tendons Flexors: Normal. Extensors: Normal. Abductors: Normal. Adductors: Normal. Rotators: Normal. Hamstrings: Normal. Other findings Miscellaneous: No pelvic free fluid. No inguinal lymphadenopathy. No inguinal hernia. IMPRESSION: 1. Evidence of prior right arthroplasty with subsequent removal of the orthopedic hardware with postsurgical changes in the proximal right femur. Large amount of fluid in the residual right hip joint with a tract of fluid extending posterior to the greater trochanters towards the iliotibial band with thick peripheral enhancement and a fluid fluid level which may reflect a hematoma versus abscess. If there is concern regarding infection, recommend aspiration. 2. 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 Electronically Signed   By: Kathreen Devoid   On: 11/20/2015 09:17   Ct Hip Right Wo Contrast  11/28/2015  CLINICAL DATA:  Right hip pain. History of lupus with numerous right hip surgeries due to recurrent infection. EXAM: CT OF THE RIGHT HIP WITHOUT CONTRAST TECHNIQUE: Multidetector CT imaging of the right hip was performed according to the standard protocol. Multiplanar CT image reconstructions were also generated. COMPARISON:  11/19/2015 FINDINGS: The patient had incision and drainage of the right hip joint yielding gross purulent material and deep soft tissue space infection on 11/24/2015. Prior Girdlestone procedure. If on today' s exam in the subcutaneous tissues there is some gas and fluid along the wound site, collection measuring 3.9 by 3.4 cm on image 20 series 4. Tiny locules of gas are also present along the deeper gluteus medius muscle and not inconsistent with recent operative intervention. There is reduction in the fluid  density in the right  hip joint as well as reduction in the space between the femur and the acetabulum, previously 8.8 cm and currently 7.2 cm, reflecting removal of some of the volume of material within the joint. There is still a band of low density posteriorly in the joint for example on image 35 series 4, and extensive soft tissue density within the joint itself compatible with phlegmon. Several small locules of gas are present posterolateral to the proximal femur as on image 43 of series 4. There is some increase in the soft tissue density posterolateral to the right proximal femur for example on image 41 series 4, compared to 11/19/15, probably connecting to the joint and possibly reflecting trochanteric bursa. My understanding from the operative note is that the anterior approach was utilized but not the prior posterior incision site. 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 No new bony abnormality. Reactive right external iliac nodes are present. No sciatic notch impingement. No definite obturator impingement although there is some indistinctness of the tissues along the hip adductor musculature similar to prior. 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 There is a curvilinear 1.3 by 0.5 cm bony fragment in the central cavity along the proximal right femur, bony sequestrum not excluded. IMPRESSION: 1. Purulent collection from the right hip joint has been drained, with resulting reduced volume in the right hip joint. Small collections of gas and fluid track along the operative wound, at 4 days postop these could be incidental non infected collections and are not considered specific for abscess. Small loculations of gas are also noted along the gluteus medius muscle and posterolateral to the right hip. There is some increase in the  soft tissue density posterolateral to the right hip likely reflecting phlegmon or possibly blood products distending this space. I suspect that there is continuity of the joint and this space posterolateral to the remaining proximal femur. 2. Prior Girdlestone procedure, with stable bony deformities. Proximal femoral appearance suggests chronic osteomyelitis. Possible bony sequestrum in the proximal right femur. Electronically Signed   By: Van Clines M.D.   On: 11/28/2015 11:52   Ct Hip Right Wo Contrast  11/19/2015  CLINICAL DATA:  Chronic right hip pain. Patient reports 23 surgery since 2012. 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 EXAM: CT OF THE RIGHT HIP WITHOUT CONTRAST TECHNIQUE: Multidetector CT imaging of the right hip was performed according to the standard protocol. Multiplanar CT image reconstructions were also generated. COMPARISON:  Abdominal pelvic CT 07/31/2011. Right hip CT 05/12/2011. Right hip radiographs 11/19/2015. FINDINGS: As demonstrated on recent radiographs, the patient has undergone removal of the right total hip arthroplasty and resection of the right femoral head and neck compared with the prior CTs. There is extensive posttraumatic deformity of the proximal right femoral diaphysis consistent with a healed fracture. The right acetabulum is also diffusely eroded and sclerotic. No active bone destruction identified. The right femur is laterally displaced relative to the pelvis. The right sacroiliac joint and symphysis pubis appear unremarkable. There is no evidence of acute fracture or dislocation. Postsurgical changes are present within the subcutaneous fat anterior and lateral to the right hip. There is ill-defined mixed intermediate and low-density between the right femur and acetabulum with multiple small bone fragments. No inflammatory changes identified within the surrounding muscles or fat. IVC filter noted. IMPRESSION: 1. Posttraumatic  deformity of the proximal right femur status post removal of right total hip arthroplasty and resection of the right femoral head and neck. The acetabulum is eroded. 2. No CT evidence of active osteomyelitis. 3. Nonspecific mixed soft tissue and low-density between the acetabulum and femur, likely synovitis. Ongoing soft tissue infection in this area cannot be completely excluded. Electronically Signed   By: Richardean Sale M.D.   On: 11/19/2015 16:31   Dg Fluoro Guided Needle Plc Aspiration/injection Loc  11/23/2015  CLINICAL DATA:  50 year old female with history of lupus, right hip replacement which was subsequently removed secondary to infection in December 2015. Extreme pain in the right hip, unable to weightbear. Abnormal rim enhancing fluid collection on MRI. Initial encounter. EXAM: RIGHT HIP INJECTION UNDER FLUOROSCOPY FLUOROSCOPY TIME:  Radiation Exposure Index (as provided by the fluoroscopic device): If the device does not provide the exposure index: Fluoroscopy Time (in minutes and seconds):  0 minutes 30 seconds Number of Acquired Images:  None PROCEDURE: Informed consent was obtained from the patient after explanation of the risks and benefits. A "time-out" was performed. Surgical absence of the right femoral head and neck re- demonstrated fluoroscopically. An appropriate skin entry site was localized. The right femoral artery was located by palpation. The overlying skin prepped with Betadine, draped in the usual sterile fashion, and infiltrated locally with buffered Lidocaine. A 20 gauge x 3.5 inch spinal needle was advanced to the fluid collection area demonstrated on the recent MRI. After briefly re-directing the needle, yellowish to light brown milky fluid was freely aspirated. In all 85 mL of such fluid was aspirated. The needle was withdrawn, direct pressure held, and hemostasis noted. The patient tolerated the procedure well. A sterile dressing was applied, and she was returned to the ward  in stable condition for continued care. Aspirated fluid was sent to the laboratory for analysis. IMPRESSION: Fluoroscopic guided aspiration of the abnormal collection occupying the residual right hip joint. 85 mL of yellowish to light brown milky fluid was obtained and strongly suspected to be purulent. Electronically Signed   By: Genevie Ann M.D.   On: 11/23/2015 13:43   Dg Hip Unilat With Pelvis 2-3 Views Right  11/19/2015  CLINICAL DATA:  Extreme RIGHT hip pain worsening, multiple prior RIGHT hip surgeries, failed hip replacement, infection EXAM: DG HIP (WITH OR WITHOUT PELVIS) 2-3V RIGHT COMPARISON:  None FINDINGS: Surgical absence of the RIGHT femoral head and neck without prosthesis. Deformity of the proximal RIGHT femur post fracture. Osseous demineralization. Pelvis appears intact. No acute fracture or bone destruction. IVC filter noted. 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 No definite acute bony abnormalities. Electronically Signed   By: Lavonia Dana M.D.   On: 11/19/2015 11:46    2D Echo:   Cardiac Cath:   Admission HPI:   THis is a 50 yo woman with a very complicated past medical history, with SLE, DVTs sp IVC filter, HTN, asthma who came in with lupus flare, and hip pain. She also has history of cardiac stent in 2013, and history of uterine cancer  Patient says that this started on Christmas when she caught a GI bug from her grandkids in [place]. Then on January 2nd, she came here to [place], and continued to have n/v/d. On January 3rd, she noticed her joints were swollen and felt like she was getting a lupus flare , then on Jan 6th, she started  to have a lot of pain in her right hip. Patient has had right hip replaced and has had 23 surgeries And frequently gets joint infections. She is on chronic steroids for her lupus- prednisone 60 mg daily, and also  plaquenil.  She says she is unable to put pressure on her hip or bear weight. Her pain is about a 6/10 and it is sharp.   Her diarrhea and vomiting has resolved since yesterday morning, and patient today drank '2 jugs of water' and she was able to keep all of that down, and she is eager to restart her diet. Before that she was having loose watery diarrhea up to 15 times per day. She says the imodium has helped.   She says her lupus was diagnosed 12 years ago, and she has frequently been on steroid tapers, however, for the past 2 years, she has been on continuous prednisone 60 mg daily. She was diagnosed when she started noticing skin rash and arthralgias. Patient used to see [facility] in [place] before she moved here on January 3rd to live with her 22 year old mother so she has yet to find a PCP or rheumatologist. She says that she frequently gets lupus flares, and in the past, she was treated with dilaudid and phenergan per paper charts she brought. She says that she frequently gets chest pain which is sharp, nonradiating and waxing and waning every time she gets a lupus flare. Currently she did endorse some chest pain and felt that this is the same exact pain she has whenever she has flare ups. She denies any fevers, but says she never had any fevers as a manifestation of her illness since her young age. She denies weight changes or night sweats. She denies hematuria or hematochezia. She used to have swallowing difficulties but since 8 months, it has been fine. She denies shortness of breath. She denies ocular manifestations of her lupus, and she reports normal vision with glasses, and no blurriness. She said at one point she had "polynephritis", but says her 'kidneys are fine right now'. She also has a port at which she receives an infusion- she is unable to provide details on that infusion for her lupus. 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 The IVC filter was placed in 2013.  She frequently gets blood transfusions and had '20 transfusions in the past year' as her hemoglobin tends to stay low.   For her lupus, since she has moved here, she has not been able to receive the infusion since November, and she has run out of the oxycodone and tramadol which she uses for flare ups at home. She has not taken her prednisone since 2 days ago.   She also had a 'cardiac stent' placed in 2013, and she is taking aspirin for that. Also present is a history of uterine cancer 20 years ago.  Regarding her hip pain, she says she has had 23 surgeries on her hip, and it has gotten to the point when she is unable to bear any weight. She denies trauma, or drainage.   Of note, patient says that she was found to have a right breast mass back in November via a mammogram, and she was supposed to have a follow up mammogram, but she was not able to make it  to the follow up appointment. At that time, she noticed discharge from her right nipple. She has not had discharge in few weeks. She also noted scaly skin changes.   In the ER, orthopedics was consulted for possible arthrocentesis.    FH: of lupus in father dx at age of 63, HTN and diabetes, breast and uterine cancer and strokes  SH: last smoked 27 years ago, no alcohol or illicit drug use. 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 Right hip joint pain in the setting of multiple previous joint infections and surgeries (23 in total) s/p removal of right hip arthroplasty: MRI + fluid collection, and an abscess could not be ruled out. Pt  is on chronic prednisone, WC has been around 18 since admission. Blood cultures NGTD. Patient underwent ct guided aspiration of her right hip on 1/23, and the fluid analysis showed turbid and brown fluid. Gram stain showed abundant gram positive cocci in pairs in clusters. She is MRSA positive. She underwent I&d of right hip 11/24/2015. Culture growing methicillin resistant Staph aureus, sensitive to both vancomycin and bactrim. No anaerobic growth. She was started on IV vancomycin and has been on that ever since. CBC shows WC of 16--> 13.4--> 18--> 19.6 --> 19 on 1/28//2017. As pt complaining of more pain on 1/27 and worsening leukocytosis, we re-consulted orthopaedics Dr Ninfa Linden. The leukocytosis may be from the chronic prednisone use. On 1/28, the WC was stable at 47, and we decided to order a CT to show if there is any abscess present The CT did not show any abscess but orthopedics decided to re-explore the area anyway, so a second re-exploration was done on Sunday 1/29 during that time they were not able to find any further sites of infection.  ID was also consulted and is following regarding her long term antibiotic therapy. Due to her persistent leukocytosis, a decision was made to taper her prednisone to 40 gm daily from 60 mg daily. We have made her an outpatient appt at [facility] to establish with new PCP and from there they will refer for new rheumatologist. Regarding her pain management, Initially she was kept on IV dilaudid but on 1/28, a decision was made to switch to oral oxycontin 30 mg BID and oxy 5 mg IR for breakthrough pain. And patient was agreeable to that. After that, the long acting oxycontin was tapered off, and patient is being sent home with Oxycodone IR 10 mg q3 hours as needed for pain, with a close follow up in our clinic, and at the wellness centre.  Pt was able to ambulate to the bathroom with assistance day before discharge, and she was looking forward to  going to [facility] to continue to work on rehabilitation.    Lupus: likely not a lupus flare. Her pain mainly from right hip which was I&Ded on 11/24/2015. Prednisone was continued at 60 mg daily and on 1/28 a taper was started at 40 mg daily. She has outpt appt scheduled. Plaquenil and volteren and gabapentin were continued.   Conjunctivitis: right eye redness with some earlier purulent drainage. She was given erythromycin ointment  and advised pt to use precautions like not touching  her left eye which had no infection. Her conjunctivitis resolved after that.    HTN: Pt 's bp has been mildly elevated and she has been on clonidine  Asthma: We continued her albuterol, symbicort, and singulair   Urinary incontinence: had one episode of urinary incontinence- repeat UA was normal   Discharge Vitals:   BP 138/80 mmHg  Pulse 67  Temp(Src) 98.1 F (36.7 C) (Oral)  Resp 16  Ht 5\' 6"  (1.676 m)  Wt 255 lb 12.8 oz (116.03 kg)  BMI 41.31 kg/m2  SpO2 100%  Discharge Labs:  Results for orders placed or performed during the hospital encounter of 11/19/15 (from the past 24 hour(s))  Glucose, capillary     Status: Abnormal   Collection Time: 11/30/15  1:05 PM  Result Value Ref Range   Glucose-Capillary 247 (H) 65 - 99 mg/dL  Glucose, capillary     Status: Abnormal   Collection Time: 11/30/15  5:31 PM  Result Value Ref Range   Glucose-Capillary 211 (H) 65 - 99 mg/dL  Glucose, capillary     Status: Abnormal   Collection Time: 11/30/15 10:11 PM  Result Value Ref Range   Glucose-Capillary 144 (H) 65 - 99 mg/dL   Comment 1 Notify RN    Comment 2 Document in Chart   Glucose, capillary     Status: None   Collection Time: 12/01/15  8:04 AM  Result Value Ref Range   Glucose-Capillary 81 65 - 99 mg/dL    Signed: [name], MD 12/01/2015, 11:52 AM    Services Ordered on Discharge:  Equipment Ordered on Discharge:

## 2015-12-01 NOTE — Progress Notes (Addendum)
1340: Attempted to call report to [facility]. 1600: Attempted to call report again.

## 2015-12-01 NOTE — Progress Notes (Signed)
Patient will DC to: [facility] Anticipated DC date:  12/01/15 Family notified: N/A Transport by: [facility]  CSW signing off.  Cedric Fishman, [place] Social Worker [phone]

## 2015-12-01 NOTE — Discharge Instructions (Signed)
VANCOMYCINE: End date 2/27, or per Infectious disease protocol. HOLD tonight 12/01/15. Check Vanc Trough level tomorrow 12/02/15. Dose per pharmacy.   Can get right hip incisions wet daily in the shower; then new dry dressing as needed   Please follow up in our clinic, and then on February 14th with your new PCP Please continue IV antibiotics (Dr. Linus Salmons and their infectious disease team is following your case and Ms [name] will make appointment for you), once you are out of [facility].

## 2015-12-01 NOTE — Progress Notes (Signed)
Subjective:  Patient seen and examined at bedside. She is POD2 s/p 2nd re-exploration of right hip area.  Her pain is much better and she is feeling much better. She was able to ambulate yesterday and is looking forward to going to SNF She received 81 morphine miliequivalent dosing of pain medicines yesterday.   Patient is agreeable to stopping IV dilaudid and long acting oxycodone and transitioning to existing short acting oxycodone, in order to simplify her pain medicine regimen.    Objective: Vital signs in last 24 hours: Filed Vitals:   11/30/15 1416 11/30/15 2214 12/01/15 0427 12/01/15 0512  BP: 155/71 152/75 138/80   Pulse: 81 81 67   Temp: 99 F (37.2 C) 97.8 F (36.6 C) 98.1 F (36.7 C)   TempSrc: Oral Oral Oral   Resp: 16 16    Height:      Weight:    255 lb 12.8 oz (116.03 kg)  SpO2: 97% 100% 100%    Weight change:   Intake/Output Summary (Last 24 hours) at 12/01/15 1112 Last data filed at 12/01/15 0900  Gross per 24 hour  Intake   1240 ml  Output   1675 ml  Net   -435 ml   General: Vital signs reviewed. Patient lying in bed  Cardiovascular: Reg rate , Reg rhythm, no murmur appreciated  Pulmonary/Chest: Clear to auscultation bilaterally, no wheezes Abdominal: Soft, obese, nontender to palpation  Hip: Right hip has a 4x4 on it- no drainage around it and no erythema  Extremities: pulses intact   Lab Results: Results for orders placed or performed during the hospital encounter of 11/19/15 (from the past 24 hour(s))  Glucose, capillary     Status: Abnormal   Collection Time: 11/30/15  1:05 PM  Result Value Ref Range   Glucose-Capillary 247 (H) 65 - 99 mg/dL  Glucose, capillary     Status: Abnormal   Collection Time: 11/30/15  5:31 PM  Result Value Ref Range   Glucose-Capillary 211 (H) 65 - 99 mg/dL  Glucose, capillary     Status: Abnormal   Collection Time: 11/30/15 10:11 PM  Result Value Ref Range   Glucose-Capillary 144 (H) 65 - 99 mg/dL   Comment 1 Notify RN    Comment 2 Document in Chart   Glucose, capillary     Status: None   Collection Time: 12/01/15  8:04 AM  Result Value Ref Range   Glucose-Capillary 81 65 - 99 mg/dL    Micro Results: Recent Results (from the past 240 hour(s))  Anaerobic culture     Status: None   Collection Time: 11/23/15  1:03 PM  Result Value Ref Range Status   Specimen Description ABSCESS RIGHT HIP  Final   Special Requests NONE  Final   Gram Stain   Final    ABUNDANT WBC PRESENT,BOTH PMN AND MONONUCLEAR NO SQUAMOUS EPITHELIAL CELLS SEEN ABUNDANT GRAM POSITIVE COCCI IN PAIRS IN CLUSTERS Performed at [facility] Performed at [facility]    Culture   Final    NO ANAEROBES ISOLATED Performed at [facility]    Report Status 11/28/2015 FINAL  Final  Gram stain     Status: None   Collection Time: 11/23/15  1:03 PM  Result Value Ref Range Status   Specimen Description ABSCESS RIGHT HIP  Final   Special Requests NONE  Final   Gram Stain   Final    ABUNDANT WBC PRESENT, PREDOMINANTLY PMN ABUNDANT GRAM POSITIVE COCCI IN PAIRS IN  CLUSTERS    Report Status 11/23/2015 FINAL  Final  Culture, routine-abscess     Status: None   Collection Time: 11/23/15  1:03 PM  Result Value Ref Range Status   Specimen Description ABSCESS RIGHT HIP  Final   Special Requests NONE  Final   Gram Stain   Final    ABUNDANT WBC PRESENT,BOTH PMN AND MONONUCLEAR NO SQUAMOUS EPITHELIAL CELLS SEEN ABUNDANT GRAM POSITIVE COCCI IN PAIRS IN CLUSTERS Performed at [facility] Performed at [facility]    Culture   Final    ABUNDANT METHICILLIN RESISTANT STAPHYLOCOCCUS AUREUS Note: RIFAMPIN AND GENTAMICIN SHOULD NOT BE USED AS SINGLE DRUGS FOR TREATMENT OF STAPH INFECTIONS. CRITICAL RESULT CALLED TO, READ BACK BY AND VERIFIED WITH: STEPHANIE ROGERS 11/26/15 0955 BY SMITHERSJ Performed at [facility]    Report Status 11/26/2015 FINAL  Final   Organism ID, Bacteria  METHICILLIN RESISTANT STAPHYLOCOCCUS AUREUS  Final      Susceptibility   Methicillin resistant staphylococcus aureus - MIC*    CLINDAMYCIN <=0.25 SENSITIVE Sensitive     ERYTHROMYCIN 0.5 SENSITIVE Sensitive     GENTAMICIN <=0.5 SENSITIVE Sensitive     LEVOFLOXACIN 0.25 SENSITIVE Sensitive     OXACILLIN >=4 RESISTANT Resistant     RIFAMPIN <=0.5 SENSITIVE Sensitive     TRIMETH/SULFA <=10 SENSITIVE Sensitive     VANCOMYCIN 1 SENSITIVE Sensitive     TETRACYCLINE <=1 SENSITIVE Sensitive     * ABUNDANT METHICILLIN RESISTANT STAPHYLOCOCCUS AUREUS  Surgical pcr screen     Status: Abnormal   Collection Time: 11/24/15  5:00 AM  Result Value Ref Range Status   MRSA, PCR POSITIVE (A) NEGATIVE Final    Comment: RESULT CALLED TO, READ BACK BY AND VERIFIED WITH: KRUSSELL @0726  11/24/15 MKELLY    Staphylococcus aureus POSITIVE (A) NEGATIVE Final    Comment:        The Xpert SA Assay (FDA approved for NASAL specimens in patients over 79 years of age), is one component of a comprehensive surveillance program.  Test performance has been validated by [facility] for patients greater than or equal to 29 year old. It is not intended to diagnose infection nor to guide or monitor treatment.   Surgical pcr screen     Status: Abnormal   Collection Time: 11/29/15  5:54 AM  Result Value Ref Range Status   MRSA, PCR POSITIVE (A) NEGATIVE Final    Comment: RESULT CALLED TO, READ BACK BY AND VERIFIED WITH: T JETER 11/29/15 @ 0825 M VESTAL    Staphylococcus aureus POSITIVE (A) NEGATIVE Final    Comment:        The Xpert SA Assay (FDA approved for NASAL specimens in patients over 19 years of age), is one component of a comprehensive surveillance program.  Test performance has been validated by [facility] for patients greater than or equal to 73 year old. It is not intended to diagnose infection nor to guide or monitor treatment.    Studies/Results: No results found. Medications: I have  reviewed the patient's current medications. Scheduled Meds: . amLODipine  10 mg Oral Daily  . bacitracin   Topical BID  . budesonide-formoterol  2 puff Inhalation BID  . cloNIDine  0.2 mg Oral BID  . erythromycin   Right Eye 3 times per day  . feeding supplement (ENSURE ENLIVE)  237 mL Oral Q24H  . gabapentin  300 mg Oral TID  . hydroxychloroquine  200 mg Oral BID  . insulin aspart  0-9 Units Subcutaneous TID WC  . montelukast  10 mg Oral Daily  . multivitamin with minerals  1 tablet Oral Daily  . polyethylene glycol  17 g Oral Daily  . predniSONE  40 mg Oral Q breakfast  . senna-docusate  2 tablet Oral BID  . vancomycin  1,250 mg Intravenous Q12H  . 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) Active Problems:   Lupus (systemic lupus erythematosus) (HCC)   History of inferior vena caval filter placement   HTN (hypertension)   Asthma  Right hip joint pain in the setting of multiple previous joint infections and surgeries (23 in total) s/p removal of right hip arthroplasty:    S/p 2 I&Ds during this hospitalization   -IV vanc per pharm, will need atleast 6 weeks, and start date was 1/23  - we changed her pain medicine regimen to Oxycodone IR 10 mg q3 hours  (stopped IV dilaudid and stopped oxycontin)  -ID is following -CSW following regarding SNF placement     Lupus: likely not a lupus flare. 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 Blood sugars have been stable.  -prednisone tapering 40 mg daily currently (taper started 1/28) -started SSI  -follow up outpt for management of diabetes   FEN--  Diet HH  DVT ppx-- heparin  Dispo:  To SNF    The patient does not have a current PCP (No Pcp Per Patient) and does need an [facility] hospital follow-up appointment after discharge.  The patient does not have transportation limitations that hinder transportation to clinic appointments.  .Services Needed at time of discharge: Y = Yes, Blank = No PT:   OT:   RN:   Equipment:   Other:     LOS: 9 days   [name], MD 12/01/2015, 11:12 AM

## 2015-12-01 NOTE — Progress Notes (Signed)
Physical Therapy Treatment Patient Details Name: Katherine Banks MRN: [id number] DOB: 08-18-1966 Today's Date: 12/01/2015    History of Present Illness 50 y.o. woman with a very complicated past medical history, with SLE, DVTs sp IVC filter, lupus, cardiac stent, HTN, asthma who came in with lupus flare, and hip pain. She also has history of cardiac stent in 2013, and history of uterine cancer. 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 s/p IRRIGATION AND DEBRIDEMENT EXTREMITY/RIGHT HIP 1/24    PT Comments    Patient making gradual progress with PT. Able to ambulate 30 feet total in room (12'X1, 18' x1). PT to continue to follow and progress as tolerated.   Follow Up Recommendations  SNF     Equipment Recommendations  None recommended by PT    Recommendations for Other Services       Precautions / Restrictions Precautions Precautions: Fall Restrictions Weight Bearing Restrictions: No    Mobility  Bed Mobility Overal bed mobility: Needs Assistance Bed Mobility: Supine to Sit     Supine to sit: Supervision;HOB elevated     General bed mobility comments: using rail to assist  Transfers Overall transfer level: Needs assistance Equipment used: Rolling walker (2 wheeled) Transfers: Sit to/from Stand Sit to Stand: Min assist;Min guard         General transfer comment: min guard from edge of bed and min assist from toilet.   Ambulation/Gait Ambulation/Gait assistance: Min guard Ambulation Distance (Feet): 30 Feet (12' X1, 18' X1) Assistive device: Rolling walker (2 wheeled) Gait Pattern/deviations: Step-to pattern;Decreased step length - right;Decreased weight shift to right Gait velocity: decreased   General Gait Details: Patient placing partial weight through Rt LE during ambulation today.    Stairs            Wheelchair  Mobility    Modified Rankin (Stroke Patients Only)       Balance Overall balance assessment: Needs assistance Sitting-balance support: No upper extremity supported Sitting balance-Leahy Scale: Normal     Standing balance support: Bilateral upper extremity supported Standing balance-Leahy Scale: Poor Standing balance comment: using rw                    Cognition Arousal/Alertness: Awake/alert Behavior During Therapy: WFL for tasks assessed/performed Overall Cognitive Status: Within Functional Limits for tasks assessed                      Exercises      General Comments        Pertinent Vitals/Pain Pain Assessment: Faces Faces Pain Scale: Hurts even more Pain Location: Rt hip Pain Descriptors / Indicators: Guarding;Grimacing Pain Intervention(s): Limited activity within patient's tolerance;Monitored during session;Premedicated before session    Home Living                      Prior Function            PT Goals (current goals can now be found in the care plan section) Acute Rehab PT Goals Patient Stated Goal: be able to get back to being herself PT Goal Formulation: With patient Time For Goal Achievement: 12/06/15 Potential to Achieve Goals: Fair Progress towards PT goals: Progressing toward goals    Frequency  Min 3X/week    PT Plan      Co-evaluation  End of Session Equipment Utilized During Treatment: Gait belt Activity Tolerance: Patient tolerated treatment well Patient left: in chair;with call bell/phone within reach     Time: 1031-1058 PT Time Calculation (min) (ACUTE ONLY): 27 min  Charges:  $Gait Training: 23-37 mins                    G CodesLinard Millers 2015-12-09, 11:09 AM

## 2015-12-04 ENCOUNTER — Telehealth: Payer: Self-pay | Admitting: General Practice

## 2015-12-04 NOTE — Telephone Encounter (Signed)
APPT REMINDER CALL/NO ANSWER/LMTCB IF SHE NEEDS TO CANCEL

## 2015-12-07 ENCOUNTER — Encounter: Payer: Self-pay | Admitting: Internal Medicine

## 2015-12-07 ENCOUNTER — Ambulatory Visit (INDEPENDENT_AMBULATORY_CARE_PROVIDER_SITE_OTHER): Payer: Medicare Other | Admitting: Internal Medicine

## 2015-12-07 VITALS — BP 100/55 | HR 77 | Temp 98.4°F | Wt 262.3 lb

## 2015-12-07 DIAGNOSIS — M329 Systemic lupus erythematosus, unspecified: Secondary | ICD-10-CM | POA: Diagnosis not present

## 2015-12-07 DIAGNOSIS — M00051 Staphylococcal arthritis, right hip: Secondary | ICD-10-CM | POA: Diagnosis not present

## 2015-12-07 DIAGNOSIS — M009 Pyogenic arthritis, unspecified: Secondary | ICD-10-CM

## 2015-12-07 DIAGNOSIS — B9562 Methicillin resistant Staphylococcus aureus infection as the cause of diseases classified elsewhere: Secondary | ICD-10-CM

## 2015-12-07 DIAGNOSIS — R300 Dysuria: Secondary | ICD-10-CM

## 2015-12-07 DIAGNOSIS — M869 Osteomyelitis, unspecified: Secondary | ICD-10-CM

## 2015-12-07 NOTE — Assessment & Plan Note (Signed)
She has chronic pain and is on prednisone taper , and plaquenil and endorses compliance. She will be referred to follow up with rheumatology. Her joint do not look inflamed and she has no rash, or fever. Cont meds. Followed with a rheumatologist before move to [place] to be close to family from [place].  Plan- refer to rheum
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
## 2015-12-07 NOTE — Assessment & Plan Note (Signed)
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 On Vancomycin for 6 weeks. End date- 12/27/2014. Appears to be healing well. Now in rehab. She has no sign of infection, and area appears to be healing well. Surgical site looks clean.  Plan- Cont Vanc - Cont Physical therapy - Refer to ortho for follo wup - Follo wup with [facility] for primary care.

## 2015-12-07 NOTE — Patient Instructions (Signed)
Please follow up with your [facility] care doctor [facility]. We will arrange for you to follow up with your orthopedist and rheumatologist.  It was nice seeing you today.

## 2015-12-08 LAB — URINALYSIS, ROUTINE W REFLEX MICROSCOPIC
BILIRUBIN UA: NEGATIVE
GLUCOSE, UA: NEGATIVE
KETONES UA: NEGATIVE
LEUKOCYTES UA: NEGATIVE
Nitrite, UA: NEGATIVE
PROTEIN UA: NEGATIVE
RBC UA: NEGATIVE
UUROB: 0.2 mg/dL (ref 0.2–1.0)
pH, UA: 5.5 (ref 5.0–7.5)

## 2015-12-10 ENCOUNTER — Telehealth: Payer: Self-pay | Admitting: Internal Medicine

## 2015-12-10 NOTE — Telephone Encounter (Signed)
Calling to get results for urine that was tested. Asked them to be faxed to [phone]

## 2015-12-10 NOTE — Progress Notes (Signed)
Internal Medicine Clinic Attending  I saw and evaluated the patient.  I personally confirmed the key portions of the history and exam documented by Dr. Emokpae and I reviewed pertinent patient test results.  The assessment, diagnosis, and plan were formulated together and I agree with the documentation in the resident's note. 

## 2015-12-11 NOTE — Telephone Encounter (Signed)
Faxed urinalysis results over. Her UA was fine, not suggesting a urinary tract infection.  Thanks [employer].  Ejiro.

## 2015-12-15 ENCOUNTER — Encounter: Payer: Medicare Other | Admitting: Family Medicine

## 2015-12-15 NOTE — Progress Notes (Signed)
This encounter was created in error - please disregard.

## 2015-12-18 ENCOUNTER — Emergency Department (HOSPITAL_COMMUNITY)
Admission: EM | Admit: 2015-12-18 | Discharge: 2015-12-18 | Disposition: A | Discharge status: Home / Self Care | Payer: Medicare Other | Attending: Emergency Medicine | Admitting: Emergency Medicine

## 2015-12-18 ENCOUNTER — Encounter (HOSPITAL_COMMUNITY): Payer: Self-pay | Admitting: Family Medicine

## 2015-12-18 DIAGNOSIS — Z7952 Long term (current) use of systemic steroids: Secondary | ICD-10-CM | POA: Insufficient documentation

## 2015-12-18 DIAGNOSIS — Z452 Encounter for adjustment and management of vascular access device: Secondary | ICD-10-CM | POA: Diagnosis not present

## 2015-12-18 DIAGNOSIS — Z87448 Personal history of other diseases of urinary system: Secondary | ICD-10-CM | POA: Insufficient documentation

## 2015-12-18 DIAGNOSIS — Z86718 Personal history of other venous thrombosis and embolism: Secondary | ICD-10-CM | POA: Diagnosis not present

## 2015-12-18 DIAGNOSIS — J45909 Unspecified asthma, uncomplicated: Secondary | ICD-10-CM | POA: Diagnosis not present

## 2015-12-18 DIAGNOSIS — M329 Systemic lupus erythematosus, unspecified: Secondary | ICD-10-CM | POA: Diagnosis not present

## 2015-12-18 DIAGNOSIS — Z79899 Other long term (current) drug therapy: Secondary | ICD-10-CM | POA: Insufficient documentation

## 2015-12-18 DIAGNOSIS — Z86711 Personal history of pulmonary embolism: Secondary | ICD-10-CM | POA: Insufficient documentation

## 2015-12-18 DIAGNOSIS — G43909 Migraine, unspecified, not intractable, without status migrainosus: Secondary | ICD-10-CM | POA: Diagnosis not present

## 2015-12-18 DIAGNOSIS — F419 Anxiety disorder, unspecified: Secondary | ICD-10-CM | POA: Diagnosis not present

## 2015-12-18 DIAGNOSIS — Z87891 Personal history of nicotine dependence: Secondary | ICD-10-CM | POA: Diagnosis not present

## 2015-12-18 DIAGNOSIS — M1990 Unspecified osteoarthritis, unspecified site: Secondary | ICD-10-CM | POA: Diagnosis not present

## 2015-12-18 DIAGNOSIS — Z8701 Personal history of pneumonia (recurrent): Secondary | ICD-10-CM | POA: Insufficient documentation

## 2015-12-18 DIAGNOSIS — Z7951 Long term (current) use of inhaled steroids: Secondary | ICD-10-CM | POA: Diagnosis not present

## 2015-12-18 DIAGNOSIS — Z87440 Personal history of urinary (tract) infections: Secondary | ICD-10-CM | POA: Diagnosis not present

## 2015-12-18 DIAGNOSIS — I10 Essential (primary) hypertension: Secondary | ICD-10-CM | POA: Diagnosis not present

## 2015-12-18 DIAGNOSIS — K219 Gastro-esophageal reflux disease without esophagitis: Secondary | ICD-10-CM | POA: Insufficient documentation

## 2015-12-18 MED ORDER — ONDANSETRON 4 MG PO TBDP
4.0000 mg | ORAL_TABLET | Freq: Once | ORAL | Status: AC
  Administered 2015-12-18: 4 mg via ORAL
  Filled 2015-12-18: qty 1

## 2015-12-18 MED ORDER — HEPARIN SOD (PORK) LOCK FLUSH 100 UNIT/ML IV SOLN
500.0000 [IU] | INTRAVENOUS | Status: AC | PRN
  Administered 2015-12-18: 500 [IU]

## 2015-12-18 MED ORDER — ALTEPLASE 2 MG IJ SOLR
2.0000 mg | Freq: Once | INTRAMUSCULAR | Status: AC
  Administered 2015-12-18: 2 mg
  Filled 2015-12-18: qty 2

## 2015-12-18 MED ORDER — VANCOMYCIN HCL 10 G IV SOLR
1250.0000 mg | Freq: Once | INTRAVENOUS | Status: DC
  Filled 2015-12-18: qty 1250

## 2015-12-18 MED ORDER — HYDROMORPHONE HCL 1 MG/ML IJ SOLN
2.0000 mg | Freq: Once | INTRAMUSCULAR | Status: AC
  Administered 2015-12-18: 2 mg via INTRAMUSCULAR
  Filled 2015-12-18: qty 2
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
## 2015-12-18 NOTE — ED Notes (Signed)
Pt here with problems with port cath. sts will flush but not draw back. sts getting IV abx through port. [place] care.

## 2015-12-23 ENCOUNTER — Other Ambulatory Visit: Payer: Self-pay | Admitting: Internal Medicine

## 2015-12-23 DIAGNOSIS — M25551 Pain in right hip: Secondary | ICD-10-CM

## 2016-01-04 ENCOUNTER — Ambulatory Visit (INDEPENDENT_AMBULATORY_CARE_PROVIDER_SITE_OTHER): Payer: Medicare Other | Admitting: Internal Medicine

## 2016-01-04 ENCOUNTER — Encounter: Payer: Self-pay | Admitting: Internal Medicine

## 2016-01-04 ENCOUNTER — Other Ambulatory Visit: Payer: Medicare Other

## 2016-01-04 VITALS — BP 103/68 | HR 69 | Temp 98.1°F | Ht 66.0 in | Wt 270.0 lb

## 2016-01-04 DIAGNOSIS — M869 Osteomyelitis, unspecified: Secondary | ICD-10-CM

## 2016-01-04 DIAGNOSIS — M009 Pyogenic arthritis, unspecified: Secondary | ICD-10-CM

## 2016-01-04 LAB — CBC WITH DIFFERENTIAL/PLATELET
BASOS PCT: 0 % (ref 0–1)
Basophils Absolute: 0 10*3/uL (ref 0.0–0.1)
EOS PCT: 2 % (ref 0–5)
Eosinophils Absolute: 0.2 10*3/uL (ref 0.0–0.7)
HCT: 28.9 % — ABNORMAL LOW (ref 36.0–46.0)
HEMOGLOBIN: 8.9 g/dL — AB (ref 12.0–15.0)
Lymphocytes Relative: 35 % (ref 12–46)
Lymphs Abs: 2.9 10*3/uL (ref 0.7–4.0)
MCH: 23.3 pg — AB (ref 26.0–34.0)
MCHC: 30.8 g/dL (ref 30.0–36.0)
MCV: 75.7 fL — ABNORMAL LOW (ref 78.0–100.0)
MONO ABS: 0.3 10*3/uL (ref 0.1–1.0)
MONOS PCT: 4 % (ref 3–12)
MPV: 8.3 fL — ABNORMAL LOW (ref 8.6–12.4)
NEUTROS ABS: 5 10*3/uL (ref 1.7–7.7)
Neutrophils Relative %: 59 % (ref 43–77)
Platelets: 364 10*3/uL (ref 150–400)
RBC: 3.82 MIL/uL — AB (ref 3.87–5.11)
RDW: 17.5 % — AB (ref 11.5–15.5)
WBC: 8.4 10*3/uL (ref 4.0–10.5)

## 2016-01-04 MED ORDER — DOXYCYCLINE HYCLATE 100 MG PO TABS: 100.0000 mg | ORAL_TABLET | Freq: Two times a day (BID) | ORAL | Status: DC

## 2016-01-04 NOTE — Assessment & Plan Note (Signed)
This has been an ongoing process so likely needs long term suppression.

## 2016-01-04 NOTE — Assessment & Plan Note (Signed)
Much improved.  Completed IV therapy.  I will start her on doxycycline for continuation for 6 months and then consider suppressive therapy with her long history of recurrent infections.

## 2016-01-04 NOTE — Progress Notes (Signed)
   Subjective:    Patient ID: Katherine Banks, female    DOB: [birth year], 50 y.o.   MRN: [id number]  HPI Here for follow up of hip infection.    Katherine Banks is a 50 y.o. female with history of lupus and chronic steroid treatment, history of right THA complicated by infection, resection arthroplasty and girdelstone procedure who noted fatigue and more difficulty walking for about 2-3 weeks. Had not been able to put pressure on leg, which she is normally able to walk on, and increased pain. Some n/v the day before admission. MRI noted a fluid collection and pus in the OR I and Ded.  Grew MRSA.  Now last week completed 6 weeks of IV vancomycin via her port.  She feels much better, much less pain more at her baseline.  No fever, no chills.  Pleased with how she feels now.    Review of Systems  Constitutional: Negative for fatigue.  Gastrointestinal: Negative for diarrhea.  Skin: Negative for rash.       Objective:   Physical Exam  Constitutional: She appears well-developed and well-nourished. No distress.  Eyes: No scleral icterus.  Cardiovascular: Normal rate, regular rhythm and normal heart sounds.   No murmur heard. Pulmonary/Chest: Effort normal and breath sounds normal. No respiratory distress.  Skin: No rash noted.          Assessment & Plan:

## 2016-01-05 ENCOUNTER — Inpatient Hospital Stay: Payer: Medicare Other | Admitting: Internal Medicine

## 2016-01-05 LAB — COMPREHENSIVE METABOLIC PANEL
ALBUMIN: 3.5 g/dL — AB (ref 3.6–5.1)
ALT: 17 U/L (ref 6–29)
AST: 14 U/L (ref 10–35)
Alkaline Phosphatase: 110 U/L (ref 33–130)
BUN: 13 mg/dL (ref 7–25)
CALCIUM: 9 mg/dL (ref 8.6–10.4)
CHLORIDE: 105 mmol/L (ref 98–110)
CO2: 25 mmol/L (ref 20–31)
CREATININE: 0.82 mg/dL (ref 0.50–1.05)
GLUCOSE: 122 mg/dL — AB (ref 65–99)
Potassium: 4.2 mmol/L (ref 3.5–5.3)
SODIUM: 140 mmol/L (ref 135–146)
Total Bilirubin: 0.2 mg/dL (ref 0.2–1.2)
Total Protein: 6.4 g/dL (ref 6.1–8.1)

## 2016-01-05 LAB — SEDIMENTATION RATE: SED RATE: 53 mm/h — AB (ref 0–30)

## 2016-01-07 ENCOUNTER — Telehealth: Payer: Self-pay | Admitting: Internal Medicine

## 2016-01-07 NOTE — Telephone Encounter (Signed)
Per charsetta this pt refused to see [facility] for her pcp, she told charsetta and it is documented that it was her choice, Estill Bamberg was informed

## 2016-01-07 NOTE — Telephone Encounter (Signed)
Rec'd call from [facility] requesting VO.  Please contact her @ [phone].

## 2016-01-13 ENCOUNTER — Other Ambulatory Visit: Payer: Self-pay | Admitting: Internal Medicine

## 2016-01-13 ENCOUNTER — Other Ambulatory Visit: Payer: Self-pay | Admitting: *Deleted

## 2016-01-13 NOTE — Telephone Encounter (Signed)
Dr Tiburcio Pea, i wanted to ask you to consider refilling this short term, pt was seen for HFU 2/6, there was not a definite return visit date on the visit, she does have one of your first visits that was available 5/8, that will be right at 3 months from her last visit. Thank you for your consideration

## 2016-01-13 NOTE — Telephone Encounter (Signed)
Calling asking for a refill for pt for dicyclomine (BENTYL) 20 MG tablet [facility] [address]

## 2016-01-13 NOTE — Telephone Encounter (Signed)
Hello Pt needs an appt to discuss the use of bentyl I have seen her on the inpatient side, but I have never seen her in the clinic. She needs an appt before this can be prescribed.  Thanks [employer]

## 2016-01-15 ENCOUNTER — Telehealth: Payer: Self-pay | Admitting: Internal Medicine

## 2016-01-15 MED ORDER — DICYCLOMINE HCL 20 MG PO TABS: 20.0000 mg | ORAL_TABLET | Freq: Two times a day (BID) | ORAL | Status: DC | PRN

## 2016-01-15 NOTE — Telephone Encounter (Signed)
I have refilled for 3 months, until she is seen in May Thanks !

## 2016-01-15 NOTE — Telephone Encounter (Signed)
Pt requesting the nurse to call back regarding oxycodone to be changed to 15 mg. Please call pt back at [phone] or [phone].

## 2016-01-18 ENCOUNTER — Encounter: Payer: Self-pay | Admitting: Internal Medicine

## 2016-01-18 ENCOUNTER — Ambulatory Visit: Payer: Medicare Other | Admitting: Internal Medicine

## 2016-01-18 ENCOUNTER — Ambulatory Visit (INDEPENDENT_AMBULATORY_CARE_PROVIDER_SITE_OTHER): Payer: Medicare Other | Admitting: Internal Medicine

## 2016-01-18 VITALS — BP 141/65 | HR 97 | Temp 97.8°F | Wt 276.2 lb

## 2016-01-18 DIAGNOSIS — N6310 Unspecified lump in the right breast, unspecified quadrant: Secondary | ICD-10-CM

## 2016-01-18 DIAGNOSIS — Z95828 Presence of other vascular implants and grafts: Secondary | ICD-10-CM

## 2016-01-18 DIAGNOSIS — M009 Pyogenic arthritis, unspecified: Secondary | ICD-10-CM

## 2016-01-18 DIAGNOSIS — I10 Essential (primary) hypertension: Secondary | ICD-10-CM

## 2016-01-18 DIAGNOSIS — Z86718 Personal history of other venous thrombosis and embolism: Secondary | ICD-10-CM

## 2016-01-18 DIAGNOSIS — M25551 Pain in right hip: Secondary | ICD-10-CM

## 2016-01-18 DIAGNOSIS — M329 Systemic lupus erythematosus, unspecified: Secondary | ICD-10-CM

## 2016-01-18 DIAGNOSIS — N63 Unspecified lump in breast: Secondary | ICD-10-CM

## 2016-01-18 MED ORDER — GABAPENTIN 400 MG PO CAPS: 800.0000 mg | ORAL_CAPSULE | Freq: Three times a day (TID) | ORAL | Status: DC

## 2016-01-18 MED ORDER — SYMBICORT 80-4.5 MCG/ACT IN AERO: 2.0000 | INHALATION_SPRAY | Freq: Two times a day (BID) | RESPIRATORY_TRACT | Status: DC

## 2016-01-18 MED ORDER — DICLOFENAC SODIUM 1 % TD GEL: 1.0000 "application " | Freq: Every day | TRANSDERMAL | Status: DC | PRN

## 2016-01-18 MED ORDER — HYDROXYCHLOROQUINE SULFATE 200 MG PO TABS: 200.0000 mg | ORAL_TABLET | Freq: Two times a day (BID) | ORAL | Status: DC

## 2016-01-18 MED ORDER — ZOLPIDEM TARTRATE 10 MG PO TABS: 10.0000 mg | ORAL_TABLET | Freq: Every day | ORAL | Status: DC

## 2016-01-18 MED ORDER — OMEPRAZOLE 20 MG PO CPDR: 20.0000 mg | DELAYED_RELEASE_CAPSULE | Freq: Two times a day (BID) | ORAL | Status: DC

## 2016-01-18 MED ORDER — LISINOPRIL 40 MG PO TABS: ORAL_TABLET | ORAL | Status: DC

## 2016-01-18 MED ORDER — ALBUTEROL SULFATE (2.5 MG/3ML) 0.083% IN NEBU: 2.5000 mg | INHALATION_SOLUTION | Freq: Three times a day (TID) | RESPIRATORY_TRACT | Status: DC

## 2016-01-18 MED ORDER — AMLODIPINE BESYLATE 10 MG PO TABS: 10.0000 mg | ORAL_TABLET | Freq: Every day | ORAL | Status: DC

## 2016-01-18 MED ORDER — OXYCODONE HCL 15 MG PO TABS: 15.0000 mg | ORAL_TABLET | Freq: Four times a day (QID) | ORAL | Status: DC | PRN

## 2016-01-18 MED ORDER — CLONIDINE HCL 0.2 MG PO TABS: 0.2000 mg | ORAL_TABLET | Freq: Two times a day (BID) | ORAL | Status: DC

## 2016-01-18 MED ORDER — CYANOCOBALAMIN 500 MCG PO TABS: 500.0000 ug | ORAL_TABLET | Freq: Every day | ORAL | Status: DC

## 2016-01-18 MED ORDER — DICYCLOMINE HCL 20 MG PO TABS: 20.0000 mg | ORAL_TABLET | Freq: Two times a day (BID) | ORAL | Status: DC | PRN

## 2016-01-18 MED ORDER — FLUTICASONE PROPIONATE 50 MCG/ACT NA SUSP: 1.0000 | Freq: Every day | NASAL | Status: DC

## 2016-01-18 NOTE — Assessment & Plan Note (Signed)
Pt has an appointment with Rheumatologist at [facility] on April 10, and I encouraged her to go to that appointment to start the tapering of prednisone and determine her DMARD regimen.

## 2016-01-18 NOTE — Assessment & Plan Note (Signed)
I have referred her to the breast center for diagnostic mammogram, and explained the need to get this done to the patient. Patient will go to the appointment

## 2016-01-18 NOTE — Telephone Encounter (Signed)
appt today, changed to dr saraiya's schedule, she wants to increase pain med
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
## 2016-01-18 NOTE — Assessment & Plan Note (Addendum)
Pt says that her current pain regimen is not controlling her right hip pain.  She says her baseline pain is 4-5 in the last few years as she has had lot of surgeries done on her right hip. She was previously taking oxycodone 15 mg q6 hours as needed and [facility] the same which was prescribed in [place]. However since she was discharged and sent to the rehab place, her pain regimen was changed to oxycodone 5 mg IR q4 hours and Oxycontin 10 mg q12 hours. She says that this regimen is not giving her adequate pain relief and currently her pain is about a 8/10 and is interfering with her quality of life. She has also been following with Orthopaedics and they asked her to walk with a walker, however patient was still in the wheelchair.   I have given her a month supply of oxycodone 15 mg q6 hours PRN #120 tablets to her, and she has an appointment here in May with [place]. This prescription will be refilled 1 more time next month if needed before she has an appt to visit here.  We also have initiated a pain contract with her,. Patient's transportation was about to leave so UDS will be performed at the next visit.   I have also explained to her that we will not be prescribing the same dose of oxycodone to her long-term and the eventual goal is to taper the oxycodone down.  I also explained the need for her to mobilize and work intensively with physical therapy to gain her strength back and this was a prerequisite if we were to continue giving her pain medicine.  I have also asked her to continue the voltaren gel, and asked her to stop taking the daily 800 mg ibuprofen.   Patient also wanted ambien, and I gave her 30 day supply with no further refills, and we agreed that patient will wean the ambien. Currently, she has 15 pills of ambien left, and patient will try to take it no more than 3 times a week and taper it down.

## 2016-01-18 NOTE — Assessment & Plan Note (Signed)
Pt has history of DVTs and last one was in 2016 and subsequently had an IVC filter which may need to be removed. .  I referred to vascular for further management.

## 2016-01-18 NOTE — Assessment & Plan Note (Signed)
Pt follows with ID and is compliant with doxycycline  She will follow up with ID as scheduled

## 2016-01-18 NOTE — Patient Instructions (Signed)
Thank you for your visit today Please follow up with rheumatologist next month Please follow up with the vascular doctor for your IVC filter Please follow up with the breast center for your mammogram Please follow up with your infectious disease doctor as scheduled   We have given you refill for oxycodone 15 mg q6 hours, and the goal is to eventually wean this medication off once your pain is better controlled. Also, i have given you a month supply of ambien, but we can not prescribe it any further as you are taking high dose of the pain medication and taking both ambien and pain medications is not advised  Please work on physical therapy and out of the wheelchair- especially when orthopedics has advised you to do that- and please follow up with them also.

## 2016-01-18 NOTE — Assessment & Plan Note (Addendum)
Pt is currently on lisinopril, amlodipine and clonidine. She thinks that her blood pressure is well controlled and at home it is between [id number] and diastolic 80 to 90.  She would like to continue taking all 3 medications for now. I explained her that I would like to taper her clonidine down int he future and put her on a different class of drug for her hypertension and patient is amenable to this approach. For next 3 months, I have refilled her lisinopril, amlodipine and clonidine.

## 2016-01-18 NOTE — Addendum Note (Signed)
Addended by: Orson Gear on: 01/18/2016 05:08 PM   Modules accepted: Orders

## 2016-01-19 NOTE — Progress Notes (Signed)
Internal Medicine Clinic Attending  Case discussed with Dr. Saraiya at the time of the visit.  We reviewed the resident's history and exam and pertinent patient test results.  I agree with the assessment, diagnosis, and plan of care documented in the resident's note.  

## 2016-01-25 NOTE — Addendum Note (Signed)
Addended by: [name] A on: 01/25/2016 01:14 PM   Modules accepted: Orders

## 2016-01-25 NOTE — Addendum Note (Signed)
Addended by: Hulan Fray on: 01/25/2016 06:01 PM   Modules accepted: Orders

## 2016-01-26 NOTE — Addendum Note (Signed)
Addended by: [name] A on: 01/26/2016 02:20 PM   Modules accepted: Orders

## 2016-02-05 ENCOUNTER — Other Ambulatory Visit: Payer: Medicare Other

## 2016-02-09 ENCOUNTER — Telehealth: Payer: Self-pay | Admitting: *Deleted

## 2016-02-09 NOTE — Telephone Encounter (Signed)
CALLED [facility] -[phone] TO FOLLOW UP ON REFERRAL IN WORK Q SINCE [phone]. LEFT VOICE MESSAGE FOR THE OFFICE TO RETURN CALL TO [phone].

## 2016-02-17 ENCOUNTER — Other Ambulatory Visit: Payer: Self-pay | Admitting: Internal Medicine

## 2016-02-17 NOTE — Telephone Encounter (Signed)
Last appt 01/18/16.

## 2016-02-17 NOTE — Telephone Encounter (Signed)
Hello COuld you pl call the patient and ask if she was able to go to Rheumatology for her lupus?  I am giving a 30 day supply as I am not sure if this has been done- if she has seen rheumatology, then they will be managing this prescription.   Thanks

## 2016-02-18 ENCOUNTER — Other Ambulatory Visit: Payer: Self-pay | Admitting: Internal Medicine

## 2016-02-18 NOTE — Telephone Encounter (Signed)
Stated she has not seen a Rheumatologist d/t insurance problems (which since has been resolved) . I have asked Lela Sturdivant (referral coordinator of the month) to f/u. Pt wanting to know why Ambien ,Prednisone and Oxycodone were stopped and she was not given refills until her appt on 5/8?

## 2016-02-18 NOTE — Telephone Encounter (Signed)
Last OV 3/20 Nothing upcoming

## 2016-02-19 NOTE — Telephone Encounter (Signed)
Pt needs to follow up with rheumatology Given 2 month supply of prednisone till this is sorted out   Also we had discussed at last visit and is documented that in future we can not prescribe ambien long term. It was a one time prescription . Pt had agreeded to wean this off by herself.  Thanks :)

## 2016-02-22 ENCOUNTER — Other Ambulatory Visit: Payer: Self-pay | Admitting: *Deleted

## 2016-02-22 ENCOUNTER — Other Ambulatory Visit: Payer: Self-pay | Admitting: Internal Medicine

## 2016-02-22 DIAGNOSIS — Z86718 Personal history of other venous thrombosis and embolism: Secondary | ICD-10-CM

## 2016-02-23 ENCOUNTER — Ambulatory Visit (INDEPENDENT_AMBULATORY_CARE_PROVIDER_SITE_OTHER): Payer: Medicare Other | Admitting: Pulmonary Disease

## 2016-02-23 ENCOUNTER — Encounter: Payer: Self-pay | Admitting: Pulmonary Disease

## 2016-02-23 VITALS — BP 152/84 | HR 101 | Temp 98.3°F | Ht 66.0 in | Wt 255.3 lb

## 2016-02-23 DIAGNOSIS — Z86718 Personal history of other venous thrombosis and embolism: Secondary | ICD-10-CM | POA: Diagnosis not present

## 2016-02-23 DIAGNOSIS — Z86711 Personal history of pulmonary embolism: Secondary | ICD-10-CM | POA: Diagnosis not present

## 2016-02-23 DIAGNOSIS — M25551 Pain in right hip: Secondary | ICD-10-CM | POA: Diagnosis present

## 2016-02-23 DIAGNOSIS — B9689 Other specified bacterial agents as the cause of diseases classified elsewhere: Secondary | ICD-10-CM

## 2016-02-23 DIAGNOSIS — M869 Osteomyelitis, unspecified: Secondary | ICD-10-CM

## 2016-02-23 DIAGNOSIS — Z95828 Presence of other vascular implants and grafts: Secondary | ICD-10-CM | POA: Diagnosis not present

## 2016-02-23 DIAGNOSIS — M868X8 Other osteomyelitis, other site: Secondary | ICD-10-CM | POA: Diagnosis not present

## 2016-02-23 MED ORDER — PREDNISONE 20 MG PO TABS: ORAL_TABLET | ORAL | Status: DC

## 2016-02-23 MED ORDER — OXYCODONE HCL 15 MG PO TABS: 15.0000 mg | ORAL_TABLET | Freq: Four times a day (QID) | ORAL | Status: DC | PRN

## 2016-02-23 MED ORDER — DOCUSATE SODIUM 100 MG PO CAPS: 100.0000 mg | ORAL_CAPSULE | Freq: Every day | ORAL | Status: DC

## 2016-02-23 NOTE — Assessment & Plan Note (Signed)
She reports she was previously followed by hematology in [place]. She is interested in having a hematologist here. 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  Unlikely that IVC filter will be removed if it has been over 6 months. Referral to hematology for continued care.

## 2016-02-23 NOTE — Patient Instructions (Signed)
Continue to monitor your hip Follow up in 1 month, or sooner as needed

## 2016-02-23 NOTE — Assessment & Plan Note (Signed)
Wound appears to be healing well. Wound care clinic referral placed.
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
## 2016-02-23 NOTE — Assessment & Plan Note (Addendum)
She was still taking her Oxycontin 10mg  q12 hours in addition to the oxycodone 15mg  q6hr prn. This is an ongoing issue and she has been on pain medications for a long time. Her last refill of the oxycodone was 3/21. Narcotics database appears appropriate.  Agreed to just continue on oxycodone 15mg  q6hr prn and discontinue the Oxycontin. UDS performed today I do not see any notes from orthopedics that she is to mobilize. Encouraged her to keep following with her surgeon. On voltaren gel. Consider PO NSAID daily. Will defer to PCP Colace for bowel regimen

## 2016-02-24 NOTE — Progress Notes (Signed)
Case discussed with Dr. Krall soon after the resident saw the patient.  We reviewed the resident's history and exam and pertinent patient test results.  I agree with the assessment, diagnosis, and plan of care documented in the resident's note. 

## 2016-02-28 LAB — TOXASSURE SELECT,+ANTIDEPR,UR: PDF: 0

## 2016-03-07 ENCOUNTER — Encounter: Payer: Medicare Other | Admitting: Internal Medicine

## 2016-03-09 ENCOUNTER — Ambulatory Visit: Payer: Medicare Other | Admitting: Internal Medicine

## 2016-03-13 ENCOUNTER — Other Ambulatory Visit: Payer: Self-pay | Admitting: Internal Medicine

## 2016-03-14 ENCOUNTER — Encounter: Payer: Self-pay | Admitting: Internal Medicine

## 2016-03-14 ENCOUNTER — Ambulatory Visit (INDEPENDENT_AMBULATORY_CARE_PROVIDER_SITE_OTHER): Payer: Medicare Other | Admitting: Internal Medicine

## 2016-03-14 VITALS — BP 157/113 | HR 73 | Temp 97.6°F

## 2016-03-14 DIAGNOSIS — I10 Essential (primary) hypertension: Secondary | ICD-10-CM | POA: Diagnosis not present

## 2016-03-14 DIAGNOSIS — M869 Osteomyelitis, unspecified: Secondary | ICD-10-CM

## 2016-03-14 DIAGNOSIS — M25559 Pain in unspecified hip: Secondary | ICD-10-CM

## 2016-03-14 DIAGNOSIS — M009 Pyogenic arthritis, unspecified: Secondary | ICD-10-CM | POA: Diagnosis not present

## 2016-03-14 LAB — CBC WITH DIFFERENTIAL/PLATELET
BASOS ABS: 0 {cells}/uL (ref 0–200)
BASOS PCT: 0 %
EOS ABS: 114 {cells}/uL (ref 15–500)
Eosinophils Relative: 1 %
HEMATOCRIT: 37.1 % (ref 35.0–45.0)
Hemoglobin: 11.9 g/dL (ref 11.7–15.5)
LYMPHS PCT: 22 %
Lymphs Abs: 2508 cells/uL (ref 850–3900)
MCH: 23.6 pg — AB (ref 27.0–33.0)
MCHC: 32.1 g/dL (ref 32.0–36.0)
MCV: 73.5 fL — AB (ref 80.0–100.0)
MONO ABS: 456 {cells}/uL (ref 200–950)
MPV: 8.8 fL (ref 7.5–12.5)
Monocytes Relative: 4 %
Neutro Abs: 8322 cells/uL — ABNORMAL HIGH (ref 1500–7800)
Neutrophils Relative %: 73 %
Platelets: 399 10*3/uL (ref 140–400)
RBC: 5.05 MIL/uL (ref 3.80–5.10)
RDW: 19 % — AB (ref 11.0–15.0)
WBC: 11.4 10*3/uL — ABNORMAL HIGH (ref 3.8–10.8)

## 2016-03-14 LAB — COMPREHENSIVE METABOLIC PANEL
ALK PHOS: 113 U/L (ref 33–130)
ALT: 15 U/L (ref 6–29)
AST: 19 U/L (ref 10–35)
Albumin: 4.1 g/dL (ref 3.6–5.1)
BUN: 10 mg/dL (ref 7–25)
CALCIUM: 9.5 mg/dL (ref 8.6–10.4)
CO2: 23 mmol/L (ref 20–31)
Chloride: 104 mmol/L (ref 98–110)
Creat: 0.82 mg/dL (ref 0.50–1.05)
Glucose, Bld: 92 mg/dL (ref 65–99)
POTASSIUM: 4.4 mmol/L (ref 3.5–5.3)
Sodium: 141 mmol/L (ref 135–146)
TOTAL PROTEIN: 7.3 g/dL (ref 6.1–8.1)
Total Bilirubin: 0.2 mg/dL (ref 0.2–1.2)

## 2016-03-14 NOTE — Assessment & Plan Note (Signed)
Concern for infection though no overt signs of infection.  Will do a CT scan to see if there is any abscess collection.   Will also check ESR, CBC.  If infeciton noted, will discuss with Dr. Ninfa Linden  Continue with doxycycline for now.

## 2016-03-14 NOTE — Assessment & Plan Note (Signed)
Will check with CT if there is any continuation of the spread of the osteomyelitis.

## 2016-03-14 NOTE — Progress Notes (Signed)
   Subjective:    Patient ID: Katherine Banks, female    DOB: [birth year], 50 y.o.   MRN: [id number]  HPI Here for follow up of hip infection.    Katherine Banks is a 50 y.o. female with history of lupus and chronic steroid treatment, history of right THA complicated by infection, resection arthroplasty and girdelstone procedure who noted fatigue and more difficulty walking for about 2-3 weeks. Had not been able to put pressure on leg, which she is normally able to walk on, and increased pain. Some n/v the day before admission. MRI noted a fluid collection and pus in the OR I and Ded.  Grew MRSA.  Completed 6 weeks of IV vancomycin via her port and placed on doxycycline 100 mg bid for 6 months, likely followed by suppressive doxy (once a day) after that.    She complains today of "having an infection".  She tells me she knows her body and when she gets an infection and she has one now on both sides.  She complains of warmth of left knee too.  She noted drainage of right hip about 1 week ago.  She tells me the pain is worse.  Asking if something more can be done.  She comes in a wheelchair.  She states she does not get a fever or chills with infections.    Review of Systems  Constitutional: Positive for activity change and fatigue. Negative for fever.  Gastrointestinal: Negative for diarrhea.  Musculoskeletal: Positive for myalgias and arthralgias.  Skin: Negative for rash.       Objective:   Physical Exam  Constitutional: She appears well-developed and well-nourished. No distress.  In wheelchair  Eyes: No scleral icterus.  Cardiovascular: Normal rate, regular rhythm and normal heart sounds.   No murmur heard. Pulmonary/Chest: Effort normal and breath sounds normal. No respiratory distress.  Musculoskeletal:  Right hip with no warmth, no fluctuance, tender with palpation, no openings of incision or drainage noted.    Left hip with no warmth, no tenderness, no drainage, no fluctuance.      Left knee with no warmth or swelling.    Skin: No rash noted.    Social History   Social History  . Marital Status: Divorced    Spouse Name: N/A  . Number of Children: N/A  . Years of Education: N/A   Occupational History  . Not on file.   Social History Main Topics  . Smoking status: Former Smoker -- 0.12 packs/day for 2 years  . Smokeless tobacco: Not on file     Comment: "quit smoking cigarettes  in 1984"  . Alcohol Use: No  . Drug Use: No  . Sexual Activity: No   Other Topics Concern  . Not on file   Social History Narrative        Assessment & Plan:

## 2016-03-14 NOTE — Assessment & Plan Note (Signed)
Elevated today, may be secondary to pain.

## 2016-03-15 LAB — SEDIMENTATION RATE: SED RATE: 38 mm/h — AB (ref 0–30)

## 2016-03-17 ENCOUNTER — Ambulatory Visit: Payer: Medicare Other | Admitting: Internal Medicine

## 2016-03-18 ENCOUNTER — Ambulatory Visit (INDEPENDENT_AMBULATORY_CARE_PROVIDER_SITE_OTHER): Payer: Medicare Other | Admitting: Internal Medicine

## 2016-03-18 ENCOUNTER — Encounter: Payer: Self-pay | Admitting: Internal Medicine

## 2016-03-18 VITALS — BP 158/87 | HR 105 | Temp 98.4°F | Ht 66.0 in | Wt 251.2 lb

## 2016-03-18 DIAGNOSIS — M869 Osteomyelitis, unspecified: Secondary | ICD-10-CM

## 2016-03-18 DIAGNOSIS — M25559 Pain in unspecified hip: Secondary | ICD-10-CM | POA: Diagnosis not present

## 2016-03-18 DIAGNOSIS — I10 Essential (primary) hypertension: Secondary | ICD-10-CM | POA: Diagnosis not present

## 2016-03-18 DIAGNOSIS — Z0000 Encounter for general adult medical examination without abnormal findings: Secondary | ICD-10-CM

## 2016-03-18 DIAGNOSIS — M868X8 Other osteomyelitis, other site: Secondary | ICD-10-CM

## 2016-03-18 DIAGNOSIS — IMO0002 Reserved for concepts with insufficient information to code with codable children: Secondary | ICD-10-CM

## 2016-03-18 DIAGNOSIS — Z7952 Long term (current) use of systemic steroids: Secondary | ICD-10-CM | POA: Diagnosis not present

## 2016-03-18 DIAGNOSIS — M329 Systemic lupus erythematosus, unspecified: Secondary | ICD-10-CM | POA: Diagnosis not present

## 2016-03-18 MED ORDER — AMLODIPINE-VALSARTAN-HCTZ 10-160-25 MG PO TABS: 1.0000 | ORAL_TABLET | Freq: Every day | ORAL | Status: DC

## 2016-03-18 MED ORDER — OXYCODONE HCL 15 MG PO TABS: 15.0000 mg | ORAL_TABLET | Freq: Four times a day (QID) | ORAL | Status: DC | PRN
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
## 2016-03-18 NOTE — Patient Instructions (Addendum)
Thank you for your visit today.   Please return to the internal medicine clinic in about 2-3 weeks for a blood pressure recheck.  Please discontinue the amlodipine and lisinopril.   I have sent you a prescription for amlodipine, valsartan, and hydrochlorothiazide.     I have made the following referrals for you: eye exam.  I will also check on your mammogram.   Please be sure to bring all of your medications with you to every visit; this includes herbal supplements, vitamins, eye drops, and any over-the-counter medications.   Should you have any questions regarding your medications and/or any new or worsening symptoms, please be sure to call the clinic at [phone].   If you believe that you are suffering from a life threatening condition or one that may result in the loss of limb or function, then you should call 911 and proceed to the nearest Emergency Department.   A healthy lifestyle and preventative care can promote health and wellness.   Maintain regular health, dental, and eye exams.  Eat a healthy diet. Foods like vegetables, fruits, whole grains, low-fat dairy products, and lean protein foods contain the nutrients you need without too many calories. Decrease your intake of foods high in solid fats, added sugars, and salt. Get information about a proper diet from your caregiver, if necessary.  Regular physical exercise is one of the most important things you can do for your health. Most adults should get at least 150 minutes of moderate-intensity exercise (any activity that increases your heart rate and causes you to sweat) each week. In addition, most adults need muscle-strengthening exercises on 2 or more days a week.   Maintain a healthy weight. The body mass index (BMI) is a screening tool to identify possible weight problems. It provides an estimate of body fat based on height and weight. Your caregiver can help determine your BMI, and can help you achieve or maintain a  healthy weight. For adults 20 years and older:  A BMI below 18.5 is considered underweight.  A BMI of 18.5 to 24.9 is normal.  A BMI of 25 to 29.9 is considered overweight.  A BMI of 30 and above is considered obese.

## 2016-03-18 NOTE — Assessment & Plan Note (Addendum)
Pt has not seen the rheumatologist at [facility] but due to insurance issues she was not able to go.  Last eye exam was about 3 yrs ago. -referral for eye exam on chronic plaquenil

## 2016-03-18 NOTE — Assessment & Plan Note (Signed)
BP elevated today at 151/115.  BP was improved on recheck.  he reports compliance.  She has been on clonidine for many years.  Denies consuming a lot of salt. -will d/c amlodipine, lisinopril  -add combo of amlodipine, valsartan, and HCTZ (hopefully her insurance will cover this)  -would try to wean off clonidine in the future  -will give info on DASH diet at next OV

## 2016-03-18 NOTE — Assessment & Plan Note (Addendum)
She is requesting a refill of her pain medication today.  She reports being in pain from her hip.  She reports almost being out in about 3 days.  According to the narcotic database, she last refilled on 4/25 for oxycodone.  No red flags on database.  She has a pain contract with [place].  However, it is concerning that her last UDS of 02/23/16 did not detect any oxycodone which is an unexpected result.  Prior to next UDS we will need to find out exactly when her last dose of oxycodone was prior to obtaining a UDS.   -would inquire at next OV when her last dose of oxycodone was and repeat UDS  -it is potentially a red flag and concerning that there was no oxycodone present in her last UDS -I refilled her oxycodone today #120 for a 30 day supply but put a start date of 5/25 since she should not be out until then  -would consider referral to a pain clinic

## 2016-03-18 NOTE — Assessment & Plan Note (Signed)
We will need to check on her mammogram status at next OV.

## 2016-03-18 NOTE — Assessment & Plan Note (Addendum)
Pt saw Dr. Linus Salmons on 5/15 (Monday of this week) for osteomyelitis of the hip.  He ordered a CT to see if there was any extension of the osteo.  Also ordered cbc, esr which were elevated.  Unfortunately, she has not gotten the CT yet.  She is on chronic prednisone for lupus.  She is on doxycycline for the osteo.   -will have her f/u for CT

## 2016-03-21 ENCOUNTER — Other Ambulatory Visit: Payer: Self-pay | Admitting: Internal Medicine

## 2016-03-22 ENCOUNTER — Other Ambulatory Visit: Payer: Self-pay | Admitting: Internal Medicine

## 2016-03-22 ENCOUNTER — Telehealth: Payer: Self-pay

## 2016-03-22 ENCOUNTER — Encounter (HOSPITAL_COMMUNITY): Payer: Self-pay | Admitting: Emergency Medicine

## 2016-03-22 ENCOUNTER — Inpatient Hospital Stay (HOSPITAL_COMMUNITY)
Admission: EM | Admit: 2016-03-22 | Discharge: 2016-03-26 | DRG: 549 | Disposition: A | Discharge status: Home / Self Care | Payer: Medicare Other | Attending: Internal Medicine | Admitting: Internal Medicine

## 2016-03-22 ENCOUNTER — Emergency Department (HOSPITAL_COMMUNITY): Payer: Medicare Other

## 2016-03-22 DIAGNOSIS — L02415 Cutaneous abscess of right lower limb: Secondary | ICD-10-CM | POA: Diagnosis not present

## 2016-03-22 DIAGNOSIS — Z8542 Personal history of malignant neoplasm of other parts of uterus: Secondary | ICD-10-CM | POA: Diagnosis not present

## 2016-03-22 DIAGNOSIS — B9689 Other specified bacterial agents as the cause of diseases classified elsewhere: Secondary | ICD-10-CM | POA: Diagnosis not present

## 2016-03-22 DIAGNOSIS — Z79899 Other long term (current) drug therapy: Secondary | ICD-10-CM | POA: Diagnosis not present

## 2016-03-22 DIAGNOSIS — Z79891 Long term (current) use of opiate analgesic: Secondary | ICD-10-CM | POA: Diagnosis not present

## 2016-03-22 DIAGNOSIS — R339 Retention of urine, unspecified: Secondary | ICD-10-CM | POA: Diagnosis present

## 2016-03-22 DIAGNOSIS — Z955 Presence of coronary angioplasty implant and graft: Secondary | ICD-10-CM | POA: Diagnosis not present

## 2016-03-22 DIAGNOSIS — M869 Osteomyelitis, unspecified: Secondary | ICD-10-CM

## 2016-03-22 DIAGNOSIS — J45909 Unspecified asthma, uncomplicated: Secondary | ICD-10-CM | POA: Diagnosis present

## 2016-03-22 DIAGNOSIS — M329 Systemic lupus erythematosus, unspecified: Secondary | ICD-10-CM | POA: Diagnosis present

## 2016-03-22 DIAGNOSIS — Z8614 Personal history of Methicillin resistant Staphylococcus aureus infection: Secondary | ICD-10-CM | POA: Diagnosis not present

## 2016-03-22 DIAGNOSIS — M25559 Pain in unspecified hip: Secondary | ICD-10-CM

## 2016-03-22 DIAGNOSIS — L02416 Cutaneous abscess of left lower limb: Secondary | ICD-10-CM | POA: Diagnosis present

## 2016-03-22 DIAGNOSIS — E669 Obesity, unspecified: Secondary | ICD-10-CM | POA: Diagnosis present

## 2016-03-22 DIAGNOSIS — M009 Pyogenic arthritis, unspecified: Secondary | ICD-10-CM | POA: Diagnosis not present

## 2016-03-22 DIAGNOSIS — L0291 Cutaneous abscess, unspecified: Secondary | ICD-10-CM

## 2016-03-22 DIAGNOSIS — Z7952 Long term (current) use of systemic steroids: Secondary | ICD-10-CM | POA: Diagnosis not present

## 2016-03-22 DIAGNOSIS — M8668 Other chronic osteomyelitis, other site: Secondary | ICD-10-CM | POA: Diagnosis not present

## 2016-03-22 DIAGNOSIS — Z9889 Other specified postprocedural states: Secondary | ICD-10-CM | POA: Diagnosis not present

## 2016-03-22 DIAGNOSIS — I10 Essential (primary) hypertension: Secondary | ICD-10-CM | POA: Diagnosis present

## 2016-03-22 DIAGNOSIS — K219 Gastro-esophageal reflux disease without esophagitis: Secondary | ICD-10-CM | POA: Diagnosis present

## 2016-03-22 DIAGNOSIS — G8929 Other chronic pain: Secondary | ICD-10-CM | POA: Diagnosis present

## 2016-03-22 DIAGNOSIS — D72829 Elevated white blood cell count, unspecified: Secondary | ICD-10-CM

## 2016-03-22 DIAGNOSIS — Z86718 Personal history of other venous thrombosis and embolism: Secondary | ICD-10-CM | POA: Diagnosis not present

## 2016-03-22 DIAGNOSIS — M25551 Pain in right hip: Secondary | ICD-10-CM | POA: Diagnosis present

## 2016-03-22 DIAGNOSIS — Z86711 Personal history of pulmonary embolism: Secondary | ICD-10-CM | POA: Diagnosis not present

## 2016-03-22 DIAGNOSIS — Z87891 Personal history of nicotine dependence: Secondary | ICD-10-CM

## 2016-03-22 DIAGNOSIS — R079 Chest pain, unspecified: Secondary | ICD-10-CM | POA: Insufficient documentation

## 2016-03-22 DIAGNOSIS — M00052 Staphylococcal arthritis, left hip: Secondary | ICD-10-CM

## 2016-03-22 DIAGNOSIS — F419 Anxiety disorder, unspecified: Secondary | ICD-10-CM | POA: Diagnosis present

## 2016-03-22 DIAGNOSIS — Z6841 Body Mass Index (BMI) 40.0 and over, adult: Secondary | ICD-10-CM | POA: Diagnosis not present

## 2016-03-22 DIAGNOSIS — F4310 Post-traumatic stress disorder, unspecified: Secondary | ICD-10-CM | POA: Diagnosis present

## 2016-03-22 DIAGNOSIS — M879 Osteonecrosis, unspecified: Secondary | ICD-10-CM | POA: Diagnosis present

## 2016-03-22 DIAGNOSIS — M86651 Other chronic osteomyelitis, right thigh: Secondary | ICD-10-CM | POA: Diagnosis present

## 2016-03-22 DIAGNOSIS — Z96641 Presence of right artificial hip joint: Secondary | ICD-10-CM | POA: Diagnosis present

## 2016-03-22 LAB — BASIC METABOLIC PANEL
ANION GAP: 10 (ref 5–15)
BUN: 11 mg/dL (ref 6–20)
CALCIUM: 9.5 mg/dL (ref 8.9–10.3)
CO2: 22 mmol/L (ref 22–32)
CREATININE: 0.81 mg/dL (ref 0.44–1.00)
Chloride: 108 mmol/L (ref 101–111)
GFR calc Af Amer: >60 mL/min (ref >60)
GLUCOSE: 99 mg/dL (ref 65–99)
Potassium: 3.3 mmol/L — ABNORMAL LOW (ref 3.5–5.1)
Sodium: 140 mmol/L (ref 135–145)

## 2016-03-22 LAB — URINALYSIS, ROUTINE W REFLEX MICROSCOPIC
Bilirubin Urine: NEGATIVE
GLUCOSE, UA: NEGATIVE mg/dL
HGB URINE DIPSTICK: NEGATIVE
KETONES UR: 15 mg/dL — AB
LEUKOCYTES UA: NEGATIVE
Nitrite: NEGATIVE
PH: 6 (ref 5.0–8.0)
PROTEIN: NEGATIVE mg/dL
Specific Gravity, Urine: 1.028 (ref 1.005–1.030)

## 2016-03-22 LAB — I-STAT TROPONIN, ED: Troponin i, poc: 0 ng/mL (ref 0.00–0.08)

## 2016-03-22 LAB — SEDIMENTATION RATE: SED RATE: 27 mm/h — AB (ref 0–22)

## 2016-03-22 LAB — CBC
HCT: 41.8 % (ref 36.0–46.0)
HEMOGLOBIN: 12.9 g/dL (ref 12.0–15.0)
MCH: 23.2 pg — AB (ref 26.0–34.0)
MCHC: 30.9 g/dL (ref 30.0–36.0)
MCV: 75.2 fL — ABNORMAL LOW (ref 78.0–100.0)
Platelets: 500 10*3/uL — ABNORMAL HIGH (ref 150–400)
RBC: 5.56 MIL/uL — ABNORMAL HIGH (ref 3.87–5.11)
RDW: 18.3 % — AB (ref 11.5–15.5)
WBC: 13 10*3/uL — ABNORMAL HIGH (ref 4.0–10.5)

## 2016-03-22 LAB — TROPONIN I

## 2016-03-22 MED ORDER — HYDROMORPHONE HCL 1 MG/ML IJ SOLN
1.0000 mg | Freq: Once | INTRAMUSCULAR | Status: AC
  Administered 2016-03-22: 1 mg via INTRAVENOUS
  Filled 2016-03-22: qty 1

## 2016-03-22 MED ORDER — IOPAMIDOL (ISOVUE-300) INJECTION 61%
INTRAVENOUS | Status: AC
  Administered 2016-03-22: 100 mL
  Filled 2016-03-22: qty 100

## 2016-03-22 MED ORDER — ONDANSETRON HCL 4 MG/2ML IJ SOLN
4.0000 mg | Freq: Once | INTRAMUSCULAR | Status: AC
  Administered 2016-03-22: 4 mg via INTRAVENOUS
  Filled 2016-03-22: qty 2

## 2016-03-22 MED ORDER — HYDROMORPHONE HCL 1 MG/ML IJ SOLN
2.0000 mg | INTRAMUSCULAR | Status: DC | PRN
  Administered 2016-03-23 (×2): 2 mg via INTRAVENOUS
  Filled 2016-03-22 (×2): qty 2

## 2016-03-22 MED ORDER — POTASSIUM CHLORIDE CRYS ER 20 MEQ PO TBCR
40.0000 meq | EXTENDED_RELEASE_TABLET | Freq: Once | ORAL | Status: AC
  Administered 2016-03-23: 40 meq via ORAL
  Filled 2016-03-22: qty 2

## 2016-03-22 MED ORDER — SODIUM CHLORIDE 0.9 % IV BOLUS (SEPSIS)
1000.0000 mL | Freq: Once | INTRAVENOUS | Status: AC
Start: 2016-03-22 — End: 2016-03-22
  Administered 2016-03-22: 1000 mL via INTRAVENOUS
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
## 2016-03-22 NOTE — ED Notes (Signed)
Patient transported to CT 

## 2016-03-22 NOTE — Telephone Encounter (Signed)
Pt requesting flexeril to be filled @ [facility] on [address].

## 2016-03-22 NOTE — Progress Notes (Signed)
Internal Medicine Clinic Attending  Case discussed with Dr. Gill soon after the resident saw the patient.  We reviewed the resident's history and exam and pertinent patient test results.  I agree with the assessment, diagnosis, and plan of care documented in the resident's note.  

## 2016-03-22 NOTE — ED Notes (Signed)
IV team at bedside 

## 2016-03-22 NOTE — ED Notes (Signed)
Patient states chest pain, headache and joint pain x 2 days.   Patient states "it's a lupus flare".   Patient states her R hand had some numbness x 4 days ago but resolved.  Patient states N/V/D x 2 days.

## 2016-03-22 NOTE — ED Provider Notes (Signed)
Complains of bilateral hip pain for the past 2 weeks worse with moving or walking. She denies any fever. She also complains of chest pain for approximately the past 2 days, constant typical up lupus flareup she's had in the past. 1045PM continues to complain of bilateral hip pain after multiple doses of intravenous opioid pain medicine. Additional intravenous hydromorphone ordered. Patient states that chest pain is typical of "lupus flareup" she's had in the past. Given two-week duration of hip pain and patient hemodynamically stable I don't feel that patient needs emergent antibiotic treatment for suspected septic arthritis. I consulted Dr. [name] from infectious disease by telephone. He is in agreement. Plan internal medicine resident physician consulted for inpatient stay, pain management. Suggest orthopedic consult. Orthopedics may want to perform arthrocentesis and culture of synovial fluid prior to antibiotics being administered. Results for orders placed or performed during the hospital encounter of [id number]  Basic metabolic panel  Result Value Ref Range   Sodium 140 135 - 145 mmol/L   Potassium 3.3 (L) 3.5 - 5.1 mmol/L   Chloride 108 101 - 111 mmol/L   CO2 22 22 - 32 mmol/L   Glucose, Bld 99 65 - 99 mg/dL   BUN 11 6 - 20 mg/dL   Creatinine, Ser 0.81 0.44 - 1.00 mg/dL   Calcium 9.5 8.9 - 10.3 mg/dL   GFR calc non Af Amer >60 >60 mL/min   GFR calc Af Amer >60 >60 mL/min   Anion gap 10 5 - 15  CBC  Result Value Ref Range   WBC 13.0 (H) 4.0 - 10.5 K/uL   RBC 5.56 (H) 3.87 - 5.11 MIL/uL   Hemoglobin 12.9 12.0 - 15.0 g/dL   HCT 41.8 36.0 - 46.0 %   MCV 75.2 (L) 78.0 - 100.0 fL   MCH 23.2 (L) 26.0 - 34.0 pg   MCHC 30.9 30.0 - 36.0 g/dL   RDW 18.3 (H) 11.5 - 15.5 %   Platelets 500 (H) 150 - 400 K/uL  Troponin I  Result Value Ref Range   Troponin I <0.03 <0.031 ng/mL  Sedimentation rate  Result Value Ref Range   Sed Rate 27 (H) 0 - 22 mm/hr  Urinalysis, Routine w reflex microscopic  (not at [facility])  Result Value Ref Range   Color, Urine YELLOW YELLOW   APPearance CLEAR CLEAR   Specific Gravity, Urine 1.028 1.005 - 1.030   pH 6.0 5.0 - 8.0   Glucose, UA NEGATIVE NEGATIVE mg/dL   Hgb urine dipstick NEGATIVE NEGATIVE   Bilirubin Urine NEGATIVE NEGATIVE   Ketones, ur 15 (A) NEGATIVE mg/dL   Protein, ur NEGATIVE NEGATIVE mg/dL   Nitrite NEGATIVE NEGATIVE   Leukocytes, UA NEGATIVE NEGATIVE  I-Stat Troponin, ED (not at [facility])  Result Value Ref Range   Troponin i, poc 0.00 0.00 - 0.08 ng/mL   Comment 3           Dg Chest 2 View  03/22/2016  CLINICAL DATA:  Chest Pain, Joint Pain, Leukocytosis, and Extreme Fatigue - Unable to stand. Nausea. Hx HTN, Lupus, Asthma, and right breast mass. EXAM: CHEST  2 VIEW COMPARISON:  11/19/2015 FINDINGS: Power injectable Port-A-Cath tip:  Lower SVC. Cardiac and mediastinal margins appear normal. Linear scarring in the left mid lung. IMPRESSION: 1. Linear scarring in the left mid lung appear stable. Lungs appear otherwise clear. 2. Port-A-Cath tip:  SVC. Electronically Signed   By: Van Clines M.D.   On: 03/22/2016 15:14   Ct Hip Left W Contrast  03/22/2016  CLINICAL DATA:  Bilateral hip pain. History of right hip arthroplasty in removal, subsequent infections. EXAM: CT OF THE LEFT HIP WITH CONTRAST; CT OF THE RIGHT HIP WITH CONTRAST TECHNIQUE: Multidetector CT imaging was performed following the standard protocol during bolus administration of intravenous contrast. CONTRAST:  17mL ISOVUE-300 IOPAMIDOL (ISOVUE-300) INJECTION 61% COMPARISON:  Radiographs earlier this day.  Right hip CT 11/28/2015. FINDINGS: CT right hip: Postsurgical change post resection of right hip arthroplasty. Fluid density within the right hip joint is unchanged from prior exam, currently 7.3 cm between the femur and acetabulum. No internal air. Deformity in the right proximal femur with the appearance of chronic osteomyelitis. A small osseous fragment measuring 1.3 cm  within the central cavity may be a bony sequestrum, unchanged in appearance from prior. Curvilinear fragmented ossification superior to the greater trochanter has increased in the interim. Deep to this fragmentation is not well-defined region of low density, measuring approximately 2.4 x 1.8 x 4.7 cm, site of previous phlegmon. Exact measurements difficult due to the irregular nature. Soft tissue thickening posteriorly tracking to the skin surface with associated osseous densities with some interval contraction. Scarring about the anterior lateral right hip soft tissues at site of prior air fluid collection. There is no acute fracture. Prominent right inguinal, internal and external iliac lymph nodes again seen. 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 There is 30-50% involvement of the femoral head articular surface. The degree of involvement is unchanged from prior CT. No significant hip joint space narrowing. Pubic rami are intact. Mild vacuum phenomenon on the left sacroiliac joint. Soft tissue evaluation of the pelvis demonstrates right inguinal and external iliac adenopathy, likely reactive. An IVC filter is in place. No intrapelvic fluid collection. IMPRESSION: CT right hip: 1. Irregular fluid collection (2.4 x 1.8 x 4.7 cm) about the greater trochanter with peripheral fragmented ossification, may reflect acute infection superimposed on chronic osteomyelitis. 2. Otherwise stable bony deformities and postsurgical change of the right proximal femur and right hip joint. CT left hip: 1. 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 This is unchanged in appearance from prior. 2. No new abnormality of the left hip. Electronically Signed   By: Jeb Levering M.D.   On: 03/22/2016 22:32   Ct Hip Right W Contrast  03/22/2016  CLINICAL DATA:  Bilateral hip pain. History of right hip arthroplasty in removal, subsequent infections. EXAM: CT  OF THE LEFT HIP WITH CONTRAST; CT OF THE RIGHT HIP WITH CONTRAST TECHNIQUE: Multidetector CT imaging was performed following the standard protocol during bolus administration of intravenous contrast. CONTRAST:  129mL ISOVUE-300 IOPAMIDOL (ISOVUE-300) INJECTION 61% COMPARISON:  Radiographs earlier this day.  Right hip CT 11/28/2015. FINDINGS: CT right hip: Postsurgical change post resection of right hip arthroplasty. Fluid density within the right hip joint is unchanged from prior exam, currently 7.3 cm between the femur and acetabulum. No internal air. Deformity in the right proximal femur with the appearance of chronic osteomyelitis. A small osseous fragment measuring 1.3 cm within the central cavity may be a bony sequestrum, unchanged in appearance from prior. Curvilinear fragmented ossification superior to the greater trochanter has increased in the interim. Deep to this fragmentation is not well-defined region of low density, measuring approximately 2.4 x 1.8 x 4.7 cm, site of previous phlegmon. Exact measurements difficult due to the irregular nature. Soft tissue thickening posteriorly tracking to the skin surface with associated osseous densities with some interval contraction. Scarring  about the anterior lateral right hip soft tissues at site of prior air fluid collection. There is no acute fracture. Prominent right inguinal, internal and external iliac lymph nodes again seen. 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 There is 30-50% involvement of the femoral head articular surface. The degree of involvement is unchanged from prior CT. No significant hip joint space narrowing. Pubic rami are intact. Mild vacuum phenomenon on the left sacroiliac joint. Soft tissue evaluation of the pelvis demonstrates right inguinal and external iliac adenopathy, likely reactive. An IVC filter is in place. No intrapelvic fluid collection. IMPRESSION: CT right hip: 1.  Irregular fluid collection (2.4 x 1.8 x 4.7 cm) about the greater trochanter with peripheral fragmented ossification, may reflect acute infection superimposed on chronic osteomyelitis. 2. Otherwise stable bony deformities and postsurgical change of the right proximal femur and right hip joint. CT left hip: 1. 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 This is unchanged in appearance from prior. 2. No new abnormality of the left hip. Electronically Signed   By: Jeb Levering M.D.   On: 03/22/2016 22:32   Dg Hips Bilat With Pelvis 3-4 Views  03/22/2016  CLINICAL DATA:  Pain and multiple infections since hip arthroplasty in 2015 EXAM: DG HIP (WITH OR WITHOUT PELVIS) 3-4V BILAT COMPARISON:  CT RIGHT hip 11/28/2015 FINDINGS: Deformity of proximal RIGHT femur post hip arthroplasty and harbor excision. Absent femoral head with superior subluxation of the RIGHT femoral shaft. Bones appear demineralized. No acute fracture or additional dislocation. Questionable area of new bone destruction at the greater trochanteric region when compared to the prior CT versus differences in imaging technique or overlying soft tissue gas. No other areas of potential bone destruction are identified. Few foci of soft tissue gas are noted at the LEFT hip. Additional a amorphous material is seen superolateral to the proximal residual shaft of the prosthesis, uncertain if represents soft tissue calcification or superimposed artifact. IMPRESSION: Significant deformity of proximal RIGHT femur post femoral head resection, hip arthroplasty and removal of hardware. Lucency at the greater trochanteric region could represent bone destruction, overlying soft tissue gas or differences in imaging technique versus prior CT. Suspected developing soft tissue calcification adjacent to the residual proximal RIGHT femur. Pelvis appears grossly intact. Electronically Signed   By: Lavonia Dana M.D.   On: 03/22/2016 17:35       Orlie Dakin, MD 03/22/16 [id number]

## 2016-03-22 NOTE — Telephone Encounter (Signed)
Called [employer] to obtain pre-authorization for CT of pelvis for patient twice. Was told by two different representatives that patient does not require pre-authorization for outpateint radiological procedures.   Called Olin Hauser at [facility] wondering when CTs will be performed. Olin Hauser explained that if a procedure must be done soon to notify them  because they just go in order in Epic work cue. Olin Hauser stated that she will contact the patient and get the referral going. Rodman Key, LPN

## 2016-03-23 ENCOUNTER — Other Ambulatory Visit: Payer: Self-pay | Admitting: Internal Medicine

## 2016-03-23 ENCOUNTER — Encounter (HOSPITAL_COMMUNITY): Payer: Self-pay | Admitting: Radiology

## 2016-03-23 ENCOUNTER — Inpatient Hospital Stay (HOSPITAL_COMMUNITY): Payer: Medicare Other

## 2016-03-23 DIAGNOSIS — Z7952 Long term (current) use of systemic steroids: Secondary | ICD-10-CM

## 2016-03-23 DIAGNOSIS — M8668 Other chronic osteomyelitis, other site: Secondary | ICD-10-CM

## 2016-03-23 DIAGNOSIS — M009 Pyogenic arthritis, unspecified: Principal | ICD-10-CM

## 2016-03-23 DIAGNOSIS — B9689 Other specified bacterial agents as the cause of diseases classified elsewhere: Secondary | ICD-10-CM

## 2016-03-23 DIAGNOSIS — Z8614 Personal history of Methicillin resistant Staphylococcus aureus infection: Secondary | ICD-10-CM

## 2016-03-23 DIAGNOSIS — J45909 Unspecified asthma, uncomplicated: Secondary | ICD-10-CM

## 2016-03-23 DIAGNOSIS — K219 Gastro-esophageal reflux disease without esophagitis: Secondary | ICD-10-CM

## 2016-03-23 DIAGNOSIS — M329 Systemic lupus erythematosus, unspecified: Secondary | ICD-10-CM

## 2016-03-23 DIAGNOSIS — I10 Essential (primary) hypertension: Secondary | ICD-10-CM

## 2016-03-23 LAB — CBC
HCT: 38 % (ref 36.0–46.0)
Hemoglobin: 11.7 g/dL — ABNORMAL LOW (ref 12.0–15.0)
MCH: 23.4 pg — ABNORMAL LOW (ref 26.0–34.0)
MCHC: 30.8 g/dL (ref 30.0–36.0)
MCV: 75.8 fL — ABNORMAL LOW (ref 78.0–100.0)
PLATELETS: 478 10*3/uL — AB (ref 150–400)
RBC: 5.01 MIL/uL (ref 3.87–5.11)
RDW: 18.5 % — ABNORMAL HIGH (ref 11.5–15.5)
WBC: 13.1 10*3/uL — AB (ref 4.0–10.5)

## 2016-03-23 LAB — BASIC METABOLIC PANEL
Anion gap: 8 (ref 5–15)
BUN: 11 mg/dL (ref 6–20)
CALCIUM: 9.3 mg/dL (ref 8.9–10.3)
CO2: 27 mmol/L (ref 22–32)
CREATININE: 0.98 mg/dL (ref 0.44–1.00)
Chloride: 105 mmol/L (ref 101–111)
Glucose, Bld: 114 mg/dL — ABNORMAL HIGH (ref 65–99)
Potassium: 3.5 mmol/L (ref 3.5–5.1)
Sodium: 140 mmol/L (ref 135–145)

## 2016-03-23 LAB — MRSA PCR SCREENING: MRSA by PCR: POSITIVE — AB

## 2016-03-23 LAB — HEPATIC FUNCTION PANEL
ALBUMIN: 3.7 g/dL (ref 3.5–5.0)
ALT: 45 U/L (ref 14–54)
AST: 52 U/L — AB (ref 15–41)
Alkaline Phosphatase: 120 U/L (ref 38–126)
BILIRUBIN TOTAL: 0.5 mg/dL (ref 0.3–1.2)
Bilirubin, Direct: <0.1 mg/dL — ABNORMAL LOW (ref 0.1–0.5)
Total Protein: 7.4 g/dL (ref 6.5–8.1)

## 2016-03-23 LAB — PROTIME-INR
INR: 1.19 (ref 0.00–1.49)
PROTHROMBIN TIME: 15.3 s — AB (ref 11.6–15.2)

## 2016-03-23 LAB — C-REACTIVE PROTEIN: CRP: 2.1 mg/dL — AB (ref <1.0)

## 2016-03-23 MED ORDER — PANTOPRAZOLE SODIUM 40 MG PO TBEC
40.0000 mg | DELAYED_RELEASE_TABLET | Freq: Every day | ORAL | Status: DC
  Administered 2016-03-23 – 2016-03-26 (×4): 40 mg via ORAL
  Filled 2016-03-23 (×5): qty 1

## 2016-03-23 MED ORDER — HYDROCHLOROTHIAZIDE 25 MG PO TABS
25.0000 mg | ORAL_TABLET | Freq: Every day | ORAL | Status: DC
  Administered 2016-03-23 – 2016-03-26 (×4): 25 mg via ORAL
  Filled 2016-03-23 (×4): qty 1

## 2016-03-23 MED ORDER — PREDNISONE 20 MG PO TABS
40.0000 mg | ORAL_TABLET | Freq: Every day | ORAL | Status: DC
  Administered 2016-03-23 – 2016-03-26 (×4): 40 mg via ORAL
  Filled 2016-03-23 (×4): qty 2

## 2016-03-23 MED ORDER — GABAPENTIN 400 MG PO CAPS
800.0000 mg | ORAL_CAPSULE | Freq: Three times a day (TID) | ORAL | Status: DC
  Administered 2016-03-23 – 2016-03-26 (×10): 800 mg via ORAL
  Filled 2016-03-23 (×10): qty 2

## 2016-03-23 MED ORDER — HYDROMORPHONE HCL 1 MG/ML IJ SOLN
2.0000 mg | INTRAMUSCULAR | Status: DC | PRN
  Administered 2016-03-23 – 2016-03-24 (×8): 2 mg via INTRAVENOUS
  Filled 2016-03-23 (×8): qty 2

## 2016-03-23 MED ORDER — CHLORHEXIDINE GLUCONATE CLOTH 2 % EX PADS
6.0000 | MEDICATED_PAD | Freq: Every day | CUTANEOUS | Status: DC
  Administered 2016-03-23 – 2016-03-26 (×3): 6 via TOPICAL

## 2016-03-23 MED ORDER — MOMETASONE FURO-FORMOTEROL FUM 100-5 MCG/ACT IN AERO
2.0000 | INHALATION_SPRAY | Freq: Two times a day (BID) | RESPIRATORY_TRACT | Status: DC
  Administered 2016-03-23 – 2016-03-26 (×5): 2 via RESPIRATORY_TRACT
  Filled 2016-03-23: qty 8.8

## 2016-03-23 MED ORDER — HYDROXYCHLOROQUINE SULFATE 200 MG PO TABS
200.0000 mg | ORAL_TABLET | Freq: Two times a day (BID) | ORAL | Status: DC
  Administered 2016-03-23 – 2016-03-26 (×8): 200 mg via ORAL
  Filled 2016-03-23 (×8): qty 1

## 2016-03-23 MED ORDER — ONDANSETRON HCL 4 MG PO TABS
4.0000 mg | ORAL_TABLET | Freq: Four times a day (QID) | ORAL | Status: DC | PRN
  Filled 2016-03-23: qty 1

## 2016-03-23 MED ORDER — FLUTICASONE PROPIONATE 50 MCG/ACT NA SUSP
1.0000 | Freq: Every day | NASAL | Status: DC
  Administered 2016-03-26: 1 via NASAL
  Filled 2016-03-23: qty 16

## 2016-03-23 MED ORDER — FENTANYL CITRATE (PF) 100 MCG/2ML IJ SOLN
INTRAMUSCULAR | Status: AC
  Filled 2016-03-23: qty 2

## 2016-03-23 MED ORDER — MIDAZOLAM HCL 2 MG/2ML IJ SOLN
INTRAMUSCULAR | Status: AC | PRN
  Administered 2016-03-23: 1 mg via INTRAVENOUS

## 2016-03-23 MED ORDER — LIDOCAINE HCL 1 % IJ SOLN
INTRAMUSCULAR | Status: AC
  Administered 2016-03-23: 10 mL
  Filled 2016-03-23: qty 20

## 2016-03-23 MED ORDER — CLONIDINE HCL 0.2 MG PO TABS
0.2000 mg | ORAL_TABLET | Freq: Two times a day (BID) | ORAL | Status: DC
  Administered 2016-03-23 – 2016-03-26 (×7): 0.2 mg via ORAL
  Filled 2016-03-23 (×8): qty 1

## 2016-03-23 MED ORDER — DOXYCYCLINE HYCLATE 100 MG PO TABS: 100.0000 mg | ORAL_TABLET | Freq: Two times a day (BID) | ORAL | Status: DC

## 2016-03-23 MED ORDER — ONDANSETRON HCL 4 MG/2ML IJ SOLN
4.0000 mg | Freq: Four times a day (QID) | INTRAMUSCULAR | Status: DC | PRN
  Administered 2016-03-23 (×2): 4 mg via INTRAVENOUS
  Filled 2016-03-23 (×2): qty 2

## 2016-03-23 MED ORDER — ALBUTEROL SULFATE (2.5 MG/3ML) 0.083% IN NEBU
2.5000 mg | INHALATION_SOLUTION | Freq: Three times a day (TID) | RESPIRATORY_TRACT | Status: DC
  Filled 2016-03-23 (×2): qty 3

## 2016-03-23 MED ORDER — ENOXAPARIN SODIUM 40 MG/0.4ML ~~LOC~~ SOLN
40.0000 mg | SUBCUTANEOUS | Status: DC
  Administered 2016-03-24 – 2016-03-25 (×2): 40 mg via SUBCUTANEOUS
  Filled 2016-03-23 (×3): qty 0.4

## 2016-03-23 MED ORDER — AMLODIPINE BESYLATE 10 MG PO TABS
10.0000 mg | ORAL_TABLET | Freq: Every day | ORAL | Status: DC
  Administered 2016-03-23 – 2016-03-26 (×4): 10 mg via ORAL
  Filled 2016-03-23 (×4): qty 1

## 2016-03-23 MED ORDER — FENTANYL CITRATE (PF) 100 MCG/2ML IJ SOLN
INTRAMUSCULAR | Status: AC | PRN
  Administered 2016-03-23: 25 ug via INTRAVENOUS

## 2016-03-23 MED ORDER — IRBESARTAN 150 MG PO TABS
150.0000 mg | ORAL_TABLET | Freq: Every day | ORAL | Status: DC
  Administered 2016-03-23 – 2016-03-26 (×4): 150 mg via ORAL
  Filled 2016-03-23 (×4): qty 1

## 2016-03-23 MED ORDER — SODIUM CHLORIDE 0.9% FLUSH
3.0000 mL | Freq: Two times a day (BID) | INTRAVENOUS | Status: DC
  Administered 2016-03-23 – 2016-03-24 (×3): 3 mL via INTRAVENOUS

## 2016-03-23 MED ORDER — AMLODIPINE-VALSARTAN-HCTZ 10-160-25 MG PO TABS: 1.0000 | ORAL_TABLET | Freq: Every day | ORAL | Status: DC

## 2016-03-23 MED ORDER — OXYCODONE HCL 5 MG PO TABS: 10.0000 mg | ORAL_TABLET | Freq: Once | ORAL | Status: DC

## 2016-03-23 MED ORDER — ZOLPIDEM TARTRATE 5 MG PO TABS
5.0000 mg | ORAL_TABLET | Freq: Every day | ORAL | Status: DC
  Administered 2016-03-23 – 2016-03-25 (×4): 5 mg via ORAL
  Filled 2016-03-23 (×4): qty 1

## 2016-03-23 MED ORDER — MUPIROCIN 2 % EX OINT
TOPICAL_OINTMENT | Freq: Two times a day (BID) | CUTANEOUS | Status: DC
  Administered 2016-03-23 – 2016-03-24 (×3): via NASAL
  Administered 2016-03-25: 1 via NASAL
  Administered 2016-03-25 – 2016-03-26 (×2): via NASAL
  Filled 2016-03-23: qty 22

## 2016-03-23 MED ORDER — MONTELUKAST SODIUM 10 MG PO TABS
10.0000 mg | ORAL_TABLET | Freq: Every day | ORAL | Status: DC
  Administered 2016-03-23 – 2016-03-25 (×3): 10 mg via ORAL
  Filled 2016-03-23 (×3): qty 1

## 2016-03-23 MED ORDER — ALBUTEROL SULFATE (2.5 MG/3ML) 0.083% IN NEBU
2.5000 mg | INHALATION_SOLUTION | RESPIRATORY_TRACT | Status: DC | PRN
  Administered 2016-03-23: 2.5 mg via RESPIRATORY_TRACT

## 2016-03-23 MED ORDER — MIDAZOLAM HCL 2 MG/2ML IJ SOLN
INTRAMUSCULAR | Status: AC
  Filled 2016-03-23: qty 2

## 2016-03-23 MED ORDER — VITAMIN B-12 1000 MCG PO TABS
500.0000 ug | ORAL_TABLET | Freq: Every day | ORAL | Status: DC
  Administered 2016-03-23 – 2016-03-26 (×4): 500 ug via ORAL
  Filled 2016-03-23 (×4): qty 1

## 2016-03-23 MED ORDER — SODIUM CHLORIDE 0.9% FLUSH
10.0000 mL | INTRAVENOUS | Status: DC | PRN
  Administered 2016-03-23 – 2016-03-26 (×3): 10 mL
  Filled 2016-03-23 (×3): qty 40

## 2016-03-23 MED ORDER — VANCOMYCIN HCL IN DEXTROSE 1-5 GM/200ML-% IV SOLN
1000.0000 mg | Freq: Two times a day (BID) | INTRAVENOUS | Status: DC
  Administered 2016-03-23 – 2016-03-25 (×5): 1000 mg via INTRAVENOUS
  Filled 2016-03-23 (×7): qty 200

## 2016-03-23 NOTE — Progress Notes (Signed)
Patient ID: Katherine Banks, female   DOB: April 04, 1966, 50 y.o.   MRN: [id number] I have seen Katherine Banks before for her chronic right hip issues.  She had multiple numerous surgeries on that hip in [place] and eventually moved down to [place] to live near her mother.  I have performed two I&D's on her right hip area (she has a previous Girdlestone procedure so her hip joint is gone) to wash out this area of chronic fluid collections.  Her CT scan in January of this years looks closely similar to her CT scan yesterday.  I have recommended a Radiology-guided aspiration of her right hip fluid collection.  I'm note sure that multiple I&D's are going to be helpful at this standpoint with out removing substantially more bone and soft-tissue.  She would still end up with likely fluid collections and still in chronic pain.  She certainly is note septic appearing either.  Will follow.
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
## 2016-03-23 NOTE — Telephone Encounter (Signed)
Pt is currently admitted We had discussed weaning flexeril off- giving 1 month supply
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
## 2016-03-23 NOTE — Procedures (Signed)
Technically successful fluoro guided aspiration of approximately 6 cc of dark red bloody, non foul smelling fluid for right femoral head resection site . All aspirated fluid sent to lab for analysis.  EBL: None. No immediate post procedural complications.  Ronny Bacon, MD Pager #: [phone]

## 2016-03-23 NOTE — Progress Notes (Signed)
Pharmacy Antibiotic Note  Katherine Banks is a 50 y.o. female admitted on 03/22/2016 with chronic right hip osteomyelitis.  Pharmacy has been consulted for Vancomycin dosing. Right hip aspirated today in IR.   Hx MRSA osteo in January, treated with IV Vanc x 6 weeks, then chronic Doxycycline.  Pharmacy assisted with Vanc dosing in January. Vanc trough 21 on Vanc 1gm IV q12h, then only 8 mcg/ml on 750 mg IV q12h, then 37 mcg/ml on 1250 mg IV q12hrs, day of discharge.  [facility] pharmacist assisted with Vanc dosing after discharge, but no information available about subsequent Vanc regimens or levels.  Plan:  Vancomycin 1 gram IV q12hrs.  Follow renal function, culture data, progress.  Vanc trough level at steady-state.  Target troughs 15-20 mcg/ml.  Height: 5\' 6"  (167.6 cm) Weight: 251 lb (113.853 kg) IBW/kg (Calculated) : 59.3  Temp (24hrs), Avg:98.2 F (36.8 C), Min:97.9 F (36.6 C), Max:98.5 F (36.9 C)   Recent Labs Lab 03/22/16 1425 03/23/16 0400  WBC 13.0* 13.1*  CREATININE 0.81 0.98    Estimated Creatinine Clearance: 86 mL/min (by C-G formula based on Cr of 0.98).    Allergies  Allergen Reactions  . Compazine [Prochlorperazine Edisylate]   . Morphine And Related   . Phenergan [Promethazine Hcl]   . Toradol [Ketorolac Tromethamine]   . Influenza Vaccines Itching, Nausea And Vomiting and Rash    Swollen lips     Antimicrobials this admission:  Vanc 5/24>>  Dose adjustments this admission:  none  Microbiology results:  5/24 aspiration from hip joint -   5/24 MRSA screen POS -> CHG/Bactroban x 5 days   Claudie Fisherman Pager: [phone] 03/23/2016 8:17 PM

## 2016-03-23 NOTE — Progress Notes (Signed)
Subjective: Patient reports continued left hip pain today. Pain medications helping but reprots not lasting long enough. Also reporting bilateral knee, finger, wrist, elbow aching pain. Reports being unable to follow up with rheumatology outpatient due to insurance issues.   Objective: Vital signs in last 24 hours: Filed Vitals:   03/22/16 2129 03/23/16 0100 03/23/16 0353 03/23/16 0935  BP: 145/96 145/95 148/84 119/77  Pulse: 106 106 106   Temp:  98.5 F (36.9 C) 98.5 F (36.9 C)   TempSrc:  Oral Oral   Resp: _0 Height:      Weight:      SpO2: 100% 99% 99%    Weight change:   Intake/Output Summary (Last 24 hours) at 03/23/16 1046 Last data filed at 03/23/16 0945  Gross per 24 hour  Intake     20 ml  Output    500 ml  Net   -480 ml   Constitutional: She is oriented to person, place, and time. She appears well-developed and well-nourished.  Obese woman in no distress. HENT:  Head: Normocephalic and atraumatic.  Mouth/Throat: Oropharynx is clear and moist.  Eyes: EOM are normal. Pupils are equal, round, and reactive to light.  Neck: Neck supple.  Cardiovascular: Normal rate, regular rhythm and intact distal pulses.  Soft late systolic murmur at RUS border.  Pulmonary/Chest: Effort normal. No respiratory distress. She has no wheezes. She has no rales.  Abdominal: Soft. She exhibits no distension. There is no guarding. BS+, non-tender. Musculoskeletal:  Multiple surgical scars at right hip that are well healed. Right hip tender to palpation without fluctuance, drainage, or open sores. There is swelling at the right lateral hip without erythema or warmth to touch. ROM is restricted at the right hip and right foot is everted. ROM is intact at left hip with pain elicited during movement. Full ROM of left foot.  Neurological: She is alert and oriented to person, place, and time. No sensory deficit.  Skin: Rash noted. She is not diaphoretic.  Malar rash.  Psychiatric:  She exhibits a depressed mood.   Medications: I have reviewed the patient's current medications. Scheduled Meds: . albuterol  2.5 mg Nebulization TID  . amLODipine  10 mg Oral Daily   And  . irbesartan  150 mg Oral Daily   And  . hydrochlorothiazide  25 mg Oral Daily  . Chlorhexidine Gluconate Cloth  6 each Topical Q0600  . cloNIDine  0.2 mg Oral BID  . enoxaparin (LOVENOX) injection  40 mg Subcutaneous Q24H  . fluticasone  1 spray Each Nare Daily  . gabapentin  800 mg Oral TID  . hydroxychloroquine  200 mg Oral BID  . mometasone-formoterol  2 puff Inhalation BID  . montelukast  10 mg Oral QHS  . mupirocin ointment   Nasal BID  . pantoprazole  40 mg Oral Daily  . predniSONE  40 mg Oral Q breakfast  . sodium chloride flush  3 mL Intravenous Q12H  . cyanocobalamin  500 mcg Oral Daily  . zolpidem  5 mg Oral QHS   Continuous Infusions:  PRN Meds:.albuterol, HYDROmorphone (DILAUDID) injection, ondansetron **OR** ondansetron (ZOFRAN) IV, sodium chloride flush Assessment/Plan:  Acute recurrent right hip abscess setting of chronic osteomyelitis of the right hip: Patient with multiple recurrences of right hip abscesses requiring surgical intervention, most recently undergoing I&D twice in January 2017. Found to have MRSA and completed 6 weeks IV vanc and continued Doxycycline until admission. CT of right hip shows an  irregular fluid collection suspicious for acute infection. Patient has a mild leukocytosis of 13k, previously as high as 20k on last admission. Afebrile. ESR 27. She has restricted ROM of her right hip and significant pain. Patient is otherwise stable and do not think she requires urgent IV antibiotics at this time. Will hold off on antibiotics until able to get cultures to guide treatment. Spoke with Dr. Ninfa Linden this morning who recommended fluro guided aspiration. Does not believe surgical intervention will be beneficial at this time. She is on plaquenil and chronic prednisone for  lupus which are likely contributing to her chronic infections.  -Hold antibiotics until cultures -ID consulted, appreciate assistance -Appreciate Ortho assistance  -IV dilaudid 2 mg q3h prn -NPO for possible procedure, advance to full after procdure -CBC, BMP in am -Check CRP  SLE: Patient feels that her lupus is flaring up with symptoms of generalized arthralgias, malar rash, hair loss, and central chest pain. She is on chronic prednisone and Plaquenil at home. Unfortunately her immunosuppressed status and long-term steroid use is likely the cause of her avascular necrosis and recurrent hip infections. She does not have a rheumatologist at this time, but a referral was placed earlier this year. Unable to be seen due to insurance issues. She will need to be plugged in with rheum for continued management of her lupus and medication adjustment. -Continue Plaquenil 200 mg BID -Continue Prednisone 40 mg po daily -Gabapentin 800 mg TID -f/u on rheum referral  HTN: BP stable.  -Amlodipine 10 mg daily -Irbesartan 150 mg daily -HCTZ 25 mg daily -Clonidine 0.2 mg BID  Asthma: No respiratory symptoms. -Albuterol nebs TID and prn -Dulera BID -Singulair 10 mg qhs -Flonase daily  GERD:  -Protonix 40 mg po qd  Diet: NPO for now  DVT ppx: Lovenox  Code: FULL  Dispo: Disposition is deferred at this time, awaiting improvement of current medical problems.    The patient does have a current PCP [name], MD) and does need an [facility] hospital follow-up appointment after discharge.  The patient does not have transportation limitations that hinder transportation to clinic appointments.  .Services Needed at time of discharge: Y = Yes, Blank = No PT:   OT:   RN:   Equipment:   Other:     LOS: 1 day   Maryellen Pile, MD IMTS PGY-1 [phone] 03/23/2016, 10:46 AM

## 2016-03-24 DIAGNOSIS — L02415 Cutaneous abscess of right lower limb: Secondary | ICD-10-CM | POA: Insufficient documentation

## 2016-03-24 DIAGNOSIS — Z9889 Other specified postprocedural states: Secondary | ICD-10-CM

## 2016-03-24 DIAGNOSIS — R339 Retention of urine, unspecified: Secondary | ICD-10-CM

## 2016-03-24 LAB — BASIC METABOLIC PANEL
Anion gap: 6 (ref 5–15)
BUN: 16 mg/dL (ref 6–20)
CALCIUM: 9.1 mg/dL (ref 8.9–10.3)
CO2: 25 mmol/L (ref 22–32)
Chloride: 108 mmol/L (ref 101–111)
Creatinine, Ser: 0.97 mg/dL (ref 0.44–1.00)
GFR calc Af Amer: >60 mL/min (ref >60)
GLUCOSE: 152 mg/dL — AB (ref 65–99)
POTASSIUM: 4.1 mmol/L (ref 3.5–5.1)
Sodium: 139 mmol/L (ref 135–145)

## 2016-03-24 LAB — CBC
HEMATOCRIT: 34.5 % — AB (ref 36.0–46.0)
Hemoglobin: 10.2 g/dL — ABNORMAL LOW (ref 12.0–15.0)
MCH: 22.6 pg — AB (ref 26.0–34.0)
MCHC: 29.6 g/dL — AB (ref 30.0–36.0)
MCV: 76.5 fL — ABNORMAL LOW (ref 78.0–100.0)
Platelets: 423 10*3/uL — ABNORMAL HIGH (ref 150–400)
RBC: 4.51 MIL/uL (ref 3.87–5.11)
RDW: 18.4 % — AB (ref 11.5–15.5)
WBC: 12 10*3/uL — ABNORMAL HIGH (ref 4.0–10.5)

## 2016-03-24 MED ORDER — FENTANYL CITRATE (PF) 100 MCG/2ML IJ SOLN
100.0000 ug | INTRAMUSCULAR | Status: DC | PRN
  Filled 2016-03-24: qty 2

## 2016-03-24 MED ORDER — HYDROMORPHONE HCL 1 MG/ML IJ SOLN
2.0000 mg | Freq: Once | INTRAMUSCULAR | Status: AC
  Administered 2016-03-24: 2 mg via INTRAVENOUS
  Filled 2016-03-24: qty 2

## 2016-03-24 MED ORDER — OXYCODONE HCL 5 MG PO TABS
10.0000 mg | ORAL_TABLET | ORAL | Status: DC | PRN
  Administered 2016-03-24 – 2016-03-25 (×4): 10 mg via ORAL
  Filled 2016-03-24 (×5): qty 2

## 2016-03-24 MED ORDER — HYDROMORPHONE HCL 2 MG PO TABS
2.0000 mg | ORAL_TABLET | ORAL | Status: DC | PRN
  Administered 2016-03-24 – 2016-03-26 (×6): 2 mg via ORAL
  Filled 2016-03-24 (×7): qty 1

## 2016-03-24 MED ORDER — ACETAMINOPHEN 325 MG PO TABS: 325.0000 mg | ORAL_TABLET | ORAL | Status: DC | PRN

## 2016-03-24 NOTE — Progress Notes (Signed)
Subjective: Patient reports continued left hip pain today. Pain medications helping but reprots not lasting long enough. Also reporting bilateral knee, finger, wrist, elbow aching pain. Reports being unable to follow up with rheumatology outpatient due to insurance issues.   Objective: Vital signs in last 24 hours: Filed Vitals:   03/23/16 1801 03/23/16 2056 03/24/16 0433 03/24/16 0857  BP: 118/71 111/50 115/48 131/76  Pulse: 81 88 84   Temp: 98 F (36.7 C) 97.6 F (36.4 C) 97.6 F (36.4 C)   TempSrc: Oral Oral Oral   Resp: '14 16 16   '$ Height:      Weight:      SpO2: 100% 95% 95%    Weight change:   Intake/Output Summary (Last 24 hours) at 03/24/16 1334 Last data filed at 03/24/16 0104  Gross per 24 hour  Intake      0 ml  Output    800 ml  Net   -800 ml   Constitutional: She is oriented to person, place, and time. She appears well-developed and well-nourished.  Obese woman in no distress. HENT:  Head: Normocephalic and atraumatic.  Mouth/Throat: Oropharynx is clear and moist.  Eyes: EOM are normal. Pupils are equal, round, and reactive to light.  Neck: Neck supple.  Cardiovascular: Normal rate, regular rhythm and intact distal pulses.  Soft late systolic murmur at RUS border.  Pulmonary/Chest: Effort normal. No respiratory distress. She has no wheezes. She has no rales.  Abdominal: Soft. She exhibits no distension. There is no guarding. BS+, non-tender. Musculoskeletal:  Multiple surgical scars at right hip that are well healed. Right hip tender to palpation without fluctuance, drainage, or open sores. There is swelling at the right lateral hip without erythema or warmth to touch. ROM is restricted at the right hip and right foot is everted. ROM is intact at left hip with pain elicited during movement. Full ROM of left foot.  Neurological: She is alert and oriented to person, place, and time. No sensory deficit.  GU: Foley in place with clear dark yellow urine Skin:  Rash noted. She is not diaphoretic.  Malar rash.  Psychiatric: She exhibits a depressed mood.   Medications: I have reviewed the patient's current medications. Scheduled Meds: . amLODipine  10 mg Oral Daily   And  . irbesartan  150 mg Oral Daily   And  . hydrochlorothiazide  25 mg Oral Daily  . Chlorhexidine Gluconate Cloth  6 each Topical Q0600  . cloNIDine  0.2 mg Oral BID  . enoxaparin (LOVENOX) injection  40 mg Subcutaneous Q24H  . fluticasone  1 spray Each Nare Daily  . gabapentin  800 mg Oral TID  . hydroxychloroquine  200 mg Oral BID  . mometasone-formoterol  2 puff Inhalation BID  . montelukast  10 mg Oral QHS  . mupirocin ointment   Nasal BID  . pantoprazole  40 mg Oral Daily  . predniSONE  40 mg Oral Q breakfast  . sodium chloride flush  3 mL Intravenous Q12H  . vancomycin  1,000 mg Intravenous Q12H  . cyanocobalamin  500 mcg Oral Daily  . zolpidem  5 mg Oral QHS   Continuous Infusions:  PRN Meds:.acetaminophen, albuterol, HYDROmorphone (DILAUDID) injection, ondansetron **OR** ondansetron (ZOFRAN) IV, oxyCODONE, sodium chloride flush Assessment/Plan:  Recurrent right hip abscess setting of chronic osteomyelitis of the right hip: Fluro guided I&D done yesterday. Gram stain showing few WBC with no organisms seen. 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 Seen by ID  last night who started her on vancomycin. Ortho consulted and no need for surgery at this time. Will continue vanc for now pending culture data. Had been on doxycycline for several months prior to admission with recurrent abscess in spite of therapy. Will discuss with ID for further ABX recommendations. CRP 2.1, ESR 53.  -continue vanc 5/24 >> -ID consulted, appreciate assistance -Appreciate Ortho assistance  -fentanyl 100 mcg q3hr prn -oxy ir 10 mg q4hr prn -CBC, BMP in am  Acute Urinary Retention: Patient reporting that she had been unable to void for 2 days prior to admission. Has never any any  issues with urination prior to this. She reports occasional blood in her urine over the past 2 weeks as well. UA was clear with no blood on admission. Denies any smoking history. Does report taking diphenhydramine around the time of her urinary retention. She takes chronic opiates at home, Oxycodone. Has been receiving hydromorphone here. Retention may be exacerbated by the hydromorphone but it is unlikely given her chronic opoid use. Will change to fentanyl today since it has a low incidence of urinary retention (3%). Will continue foley today and try voiding trial tomorrow as the diphenhydramine should clear her system by that time. -Foley -Voiding trial in am  SLE: Patient feels that her lupus is flaring up with symptoms of generalized arthralgias, malar rash, hair loss, and central chest pain. She is on chronic prednisone and Plaquenil at home. Unfortunately her immunosuppressed status and long-term steroid use is likely the cause of her avascular necrosis and recurrent hip infections. She does not have a rheumatologist at this time, but a referral was placed earlier this year. Unable to be seen due to insurance issues. She will need to be plugged in with rheum for continued management of her lupus and medication adjustment. -Continue Plaquenil 200 mg BID -Continue Prednisone 40 mg po daily -Gabapentin 800 mg TID -f/u on rheum referral  HTN: BP stable.  638-466 systolic.  -Amlodipine 10 mg daily -Irbesartan 150 mg daily -HCTZ 25 mg daily -Clonidine 0.2 mg BID  Asthma: No respiratory symptoms. -Albuterol nebs TID and prn -Dulera BID -Singulair 10 mg qhs -Flonase daily  GERD:  -Protonix 40 mg po qd  Diet: Full diet  DVT ppx: Lovenox  Code: FULL  Dispo: Disposition is deferred at this time, awaiting improvement of current medical problems.    The patient does have a current PCP [name], MD) and does need an [facility] hospital follow-up appointment after discharge.  The patient  does not have transportation limitations that hinder transportation to clinic appointments.  .Services Needed at time of discharge: Y = Yes, Blank = No PT:   OT:   RN:   Equipment:   Other:     LOS: 2 days   Maryellen Pile, MD IMTS PGY-1 [phone] 03/24/2016, 1:34 PM

## 2016-03-24 NOTE — Progress Notes (Signed)
Initial Nutrition Assessment  DOCUMENTATION CODES:   Morbid obesity  INTERVENTION:  Provide nourishment snacks. (Ordered)  Encourage adequate PO intake.   NUTRITION DIAGNOSIS:   Increased nutrient needs related to acute illness as evidenced by estimated needs.  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   Pt reports appetite has improved. Meal completion has been 100%. Pt reports she had poor po intake the 2 days PTA as she was experiencing n/v. Pt reports weight loss over the past 2 week, however only reports a couple of pounds. Usual body weight no reported. Per Epic weight records, weight has been fluctuating. Pt requests nourishment snacks. RD to order.   Pt with no observed significant fat or muscle mass loss.   Labs and medications reviewed.   Diet Order:  Diet regular Room service appropriate?: Yes; Fluid consistency:: Thin  Skin:   (Incision on R hip)  Last BM:  5/23  Height:   Ht Readings from Last 1 Encounters:  03/22/16 5\' 6"  (1.676 m)    Weight:   Wt Readings from Last 1 Encounters:  03/22/16 251 lb (113.853 kg)    Ideal Body Weight:  59 kg  BMI:  Body mass index is 40.53 kg/(m^2).  Estimated Nutritional Needs:   Kcal:  1750-2000  Protein:  100-115 grams  Fluid:  1.8 - 2 L/day  EDUCATION NEEDS:   No education needs identified at this time  Corrin Parker, MS, RD, LDN Pager # [phone] After hours/ weekend pager # [phone]

## 2016-03-24 NOTE — Progress Notes (Signed)
Patient ID: Katherine Banks, female   DOB: 05/05/1966, 50 y.o.   MRN: [id number] No acute changes.  Aspirating from CT mainly bloody and now organisms thus far.  Do not anticipate any surgery.  Does need chronic pain management.

## 2016-03-24 NOTE — Progress Notes (Signed)
  Date: 03/24/2016  Patient name: Katherine Banks  Medical record number: [id number]  Date of birth: Mar 21, 1966   This patient has been seen and the plan of care was discussed with the house staff. Please see Dr. Shanna Cisco note for complete details. I concur with his findings.  Sid Falcon, MD 03/24/2016, 2:51 PM

## 2016-03-25 ENCOUNTER — Encounter (HOSPITAL_COMMUNITY): Payer: Self-pay | Admitting: General Practice

## 2016-03-25 DIAGNOSIS — R079 Chest pain, unspecified: Secondary | ICD-10-CM | POA: Insufficient documentation

## 2016-03-25 LAB — BASIC METABOLIC PANEL
ANION GAP: 7 (ref 5–15)
BUN: 15 mg/dL (ref 6–20)
CHLORIDE: 107 mmol/L (ref 101–111)
CO2: 27 mmol/L (ref 22–32)
CREATININE: 0.82 mg/dL (ref 0.44–1.00)
Calcium: 9 mg/dL (ref 8.9–10.3)
GFR calc non Af Amer: >60 mL/min (ref >60)
Glucose, Bld: 116 mg/dL — ABNORMAL HIGH (ref 65–99)
Potassium: 3.6 mmol/L (ref 3.5–5.1)
Sodium: 141 mmol/L (ref 135–145)

## 2016-03-25 LAB — CBC
HCT: 33 % — ABNORMAL LOW (ref 36.0–46.0)
HEMOGLOBIN: 9.8 g/dL — AB (ref 12.0–15.0)
MCH: 22.7 pg — AB (ref 26.0–34.0)
MCHC: 29.7 g/dL — ABNORMAL LOW (ref 30.0–36.0)
MCV: 76.4 fL — AB (ref 78.0–100.0)
Platelets: 421 10*3/uL — ABNORMAL HIGH (ref 150–400)
RBC: 4.32 MIL/uL (ref 3.87–5.11)
RDW: 18.5 % — ABNORMAL HIGH (ref 11.5–15.5)
WBC: 15.9 10*3/uL — ABNORMAL HIGH (ref 4.0–10.5)

## 2016-03-25 MED ORDER — OXYCODONE HCL 5 MG PO TABS
10.0000 mg | ORAL_TABLET | ORAL | Status: DC
  Administered 2016-03-25 – 2016-03-26 (×6): 10 mg via ORAL
  Filled 2016-03-25 (×5): qty 2

## 2016-03-25 NOTE — Progress Notes (Signed)
ID PROGRESS NOTE:   Follow up on cultures appear that it is not regrowing MRSA. Would recommend to keep with doxycycline 100mg  BID for suppression x 3 months. Follow up with Dr. Linus Salmons. At this time, would not need to redo IV vanco for 6 wk , it does not appear to be a flare of acute on chronic osteo of hip.  If further questions, can contact dr. [name] available for questions  Caren Griffins B. [facility] [phone]

## 2016-03-25 NOTE — Progress Notes (Signed)
Patient urinated moderate amount.  Post void bladder scan revealed 0 cc in bladder.

## 2016-03-25 NOTE — Progress Notes (Signed)
Subjective: Patient reports pain not well controlled overnight. Had difficulty sleeping due to her pain. States that the fentanyl does not help at all and the dilaudid helps. Pain is doing better this morning after getting dilaudid. Reports foley was removed this morning and she was able to void on her own with no issues. No PVR measured.  Objective: Vital signs in last 24 hours: Filed Vitals:   03/24/16 0857 03/24/16 1435 03/24/16 2029 03/25/16 0618  BP: 131/76 100/70 133/87 113/59  Pulse:  92 109 76  Temp:    98 F (36.7 C)  TempSrc:  Oral Oral Oral  Resp:  '18 18 16  '$ Height:      Weight:      SpO2:  96% 98% 100%   Weight change:   Intake/Output Summary (Last 24 hours) at 03/25/16 8185 Last data filed at 03/24/16 1833  Gross per 24 hour  Intake    720 ml  Output    850 ml  Net   -130 ml   Constitutional: She is oriented to person, place, and time. She appears well-developed and well-nourished.  Obese woman in no distress. Cardiovascular: Normal rate, regular rhythm and intact distal pulses.Soft late systolic murmur at RUS border.  Pulmonary/Chest: Effort normal. No respiratory distress. She has no wheezes. She has no rales.  Abdominal: Soft. She exhibits no distension. There is no guarding. BS+, non-tender. Musculoskeletal:  Multiple surgical scars at right hip that are well healed. Right hip tender to palpation without fluctuance, drainage, or open sores. There is swelling at the right lateral hip without erythema or warmth to touch. ROM is restricted at the right hip and right foot is everted. ROM is intact at left hip with pain elicited during movement. Full ROM of left foot.  Neurological: She is alert and oriented to person, place, and time. No sensory deficit.  Skin: Rash noted. She is not diaphoretic.  Malar rash.  Psychiatric: Normal mood and affect  Medications: I have reviewed the patient's current medications. Scheduled Meds: . amLODipine  10 mg Oral Daily   And  . irbesartan  150 mg Oral Daily   And  . hydrochlorothiazide  25 mg Oral Daily  . Chlorhexidine Gluconate Cloth  6 each Topical Q0600  . cloNIDine  0.2 mg Oral BID  . enoxaparin (LOVENOX) injection  40 mg Subcutaneous Q24H  . fluticasone  1 spray Each Nare Daily  . gabapentin  800 mg Oral TID  . hydroxychloroquine  200 mg Oral BID  . mometasone-formoterol  2 puff Inhalation BID  . montelukast  10 mg Oral QHS  . mupirocin ointment   Nasal BID  . pantoprazole  40 mg Oral Daily  . predniSONE  40 mg Oral Q breakfast  . sodium chloride flush  3 mL Intravenous Q12H  . vancomycin  1,000 mg Intravenous Q12H  . cyanocobalamin  500 mcg Oral Daily  . zolpidem  5 mg Oral QHS   Continuous Infusions:  PRN Meds:.acetaminophen, albuterol, HYDROmorphone, ondansetron **OR** ondansetron (ZOFRAN) IV, oxyCODONE, sodium chloride flush Assessment/Plan:  Recurrent right hip fluid collection setting of chronic osteomyelitis of the right hip: Fluro guided I&D done 5/24. Gram stain showing few WBC with no organisms seen. Culture with no growth at <24 hours. 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 On vancomycin day 3 for prophylactic coverage. Patient is afebrile. Has a mild chronic leukocytosis with her chronic steroid use 12 > 15.9 today. ESR and CRP only mildly elevated. Suspect this  was a fluid collection with the lack of her femoral head with negative space vs from her lupus and not a true abscess. Will continue vanc and monitor cultures for 48 hours of growth. If no growth at 48 hours will d/c vancomycin. Will discuss further ABX recommendations with ID. Transition to PO pain medications. Will get PT consult today, social work for possible SNF placement at discharge. Anticipate ready for discharge tomorrow pending culture results.  -continue vanc 5/24 >> -ID consulted, appreciate assistance -Appreciate Ortho assistance  -Oxycodone IR 10 MG PO q4hr  -hydromorphone 2 mg PO q4hr prn  breakthrough -CBC, BMP in am -PT consult -Social work consult  Acute Urinary Retention: Foley out. Patient able to void without difficulty. No PVR measured. Will ask nurse to assess PVR after next void. Suspect this was related to her diphenhydramine use. Will instruct her to avoid using it in the future.  -PVR  SLE: Patient feels that her lupus is flaring up with symptoms of generalized arthralgias, malar rash, hair loss, and central chest pain. She is on chronic prednisone and Plaquenil at home. Unfortunately her immunosuppressed status and long-term steroid use is likely the cause of her avascular necrosis and recurrent hip infections. She does not have a rheumatologist at this time, but a referral was placed earlier this year. Unable to be seen due to insurance issues. She will need to be plugged in with rheum for continued management of her lupus and medication adjustment. -Continue Plaquenil 200 mg BID -Continue Prednisone 40 mg po daily -Gabapentin 800 mg TID -f/u on rheum referral  HTN: BP stable.  967-893 systolic.  -Amlodipine 10 mg daily -Irbesartan 150 mg daily -HCTZ 25 mg daily -Clonidine 0.2 mg BID  Asthma: No respiratory symptoms. -Albuterol nebs TID and prn -Dulera BID -Singulair 10 mg qhs -Flonase daily  GERD:  -Protonix 40 mg po qd  Diet: Full diet  DVT ppx: Lovenox  Code: FULL  Dispo: Disposition is deferred at this time, awaiting improvement of current medical problems.    The patient does have a current PCP [name], MD) and does need an [facility] hospital follow-up appointment after discharge.  The patient does not have transportation limitations that hinder transportation to clinic appointments.  .Services Needed at time of discharge: Y = Yes, Blank = No PT:   OT:   RN:   Equipment:   Other:     LOS: 3 days   Maryellen Pile, MD IMTS PGY-1 [phone] 03/25/2016, 6:33 AM

## 2016-03-25 NOTE — Evaluation (Addendum)
Physical Therapy Evaluation Patient Details Name: Katherine Banks MRN: [id number] DOB: 06/02/1966 Today's Date: 03/25/2016   History of Present Illness  Katherine Banks is a 50yo woman with PMH of chronic OM of the right hip. 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 She has had multiple surgeries to the right hip, hardware place and removed. She now has no hardware in the hip  She has recently been treated for MRSA in January, she had I&D,.However, over the last 2 weeks, she has felt worse pain, malaise and weakness. A CT of the hips did show a nw fluid collection. She also notes that her lupus is flaring with facial skin rash and joint pain throughout. She is  modified independent with WC and short distance ambulation with RW.  Clinical Impression  The patient  Is mobilizing well with RW and short distance ambulation, maintaining NWB. The patient reports that she  Uses her WC mostly, is waiting for a ramp to be built as she moved  To the area in 12/16.. The patient will benefit from PT while in acute care to address the problems listed in the note below.    Follow Up Recommendations No PT follow up    Equipment Recommendations  None recommended by PT    Recommendations for Other Services       Precautions / Restrictions Precautions Precautions: Fall      Mobility  Bed Mobility Overal bed mobility: Modified Independent                Transfers Overall transfer level: Modified independent Equipment used: Rolling walker (2 wheeled)                Ambulation/Gait Ambulation/Gait assistance: Min guard Ambulation Distance (Feet): 20 Feet Assistive device: Rolling walker (2 wheeled) Gait Pattern/deviations: Step-to pattern     General Gait Details: remained NWB on the Right leg  Stairs            Wheelchair Mobility    Modified Rankin (Stroke  Patients Only)       Balance Overall balance assessment: Needs assistance         Standing balance support: During functional activity;Bilateral upper extremity supported Standing balance-Leahy Scale: Fair                               Pertinent Vitals/Pain Pain Assessment: 0-10 Pain Score: 6  Pain Location: r hip, generalized Pain Descriptors / Indicators: Aching;Discomfort Pain Intervention(s): Limited activity within patient's tolerance;Monitored during session;Premedicated before session;Repositioned    Home Living   Living Arrangements: Parent  1 level 4 steps with 2 rails, unable to reach both  2 wheeled walker, shower seat, walk in shower, manual wheelchair.                    Prior Function Level of Independence: Independent with assistive device(s)         Comments: Ambulated 10-15 feet max with RW. W/C used for primary mobility. Pt independent with ADLs and transfers.     Hand Dominance        Extremity/Trunk Assessment   Upper Extremity Assessment: Overall WFL for tasks assessed           Lower Extremity Assessment: RLE deficits/detail RLE Deficits / Details: maunally moves the R leg with hands onto and off  the bed.     Cervical / Trunk Assessment: Other exceptions  Communication   Communication: No difficulties  Cognition Arousal/Alertness: Awake/alert Behavior During Therapy: WFL for tasks assessed/performed;Anxious Overall Cognitive Status: Within Functional Limits for tasks assessed                      General Comments      Exercises        Assessment/Plan    PT Assessment Patient needs continued PT services  PT Diagnosis Difficulty walking;Generalized weakness;Acute pain   PT Problem List Decreased activity tolerance;Pain  PT Treatment Interventions DME instruction;Gait training;Functional mobility training;Therapeutic activities;Therapeutic exercise;Patient/family education   PT Goals (Current  goals can be found in the Care Plan section) Acute Rehab PT Goals Patient Stated Goal: to go home PT Goal Formulation: With patient Time For Goal Achievement: 04/08/16 Potential to Achieve Goals: Good    Frequency Min 3X/week   Barriers to discharge        Co-evaluation               End of Session   Activity Tolerance: Patient tolerated treatment well Patient left: in bed;with call bell/phone within reach Nurse Communication: Mobility status         Time: 1530-1556 PT Time Calculation (min) (ACUTE ONLY): 26 min   Charges:   PT Evaluation $PT Eval Low Complexity: 1 Procedure PT Treatments $Gait Training: 8-22 mins   PT G Codes:        Claretha Cooper 03/25/2016, 4:14 PM Tresa Endo PT [phone]

## 2016-03-25 NOTE — Progress Notes (Signed)
  Date: 03/25/2016  Patient name: Katherine Banks  Medical record number: [id number]  Date of birth: 1966-01-09   This patient has been seen and the plan of care was discussed with the house staff. Please see Dr. Shanna Cisco note for complete details. I concur with his findings.  Sid Falcon, MD 03/25/2016, 3:48 PM

## 2016-03-26 LAB — CBC
HCT: 32.2 % — ABNORMAL LOW (ref 36.0–46.0)
Hemoglobin: 9.6 g/dL — ABNORMAL LOW (ref 12.0–15.0)
MCH: 22.7 pg — AB (ref 26.0–34.0)
MCHC: 29.8 g/dL — AB (ref 30.0–36.0)
MCV: 76.1 fL — ABNORMAL LOW (ref 78.0–100.0)
PLATELETS: 354 10*3/uL (ref 150–400)
RBC: 4.23 MIL/uL (ref 3.87–5.11)
RDW: 18.7 % — ABNORMAL HIGH (ref 11.5–15.5)
WBC: 13.3 10*3/uL — ABNORMAL HIGH (ref 4.0–10.5)

## 2016-03-26 LAB — BASIC METABOLIC PANEL
ANION GAP: 8 (ref 5–15)
BUN: 14 mg/dL (ref 6–20)
CALCIUM: 9 mg/dL (ref 8.9–10.3)
CO2: 28 mmol/L (ref 22–32)
CREATININE: 0.78 mg/dL (ref 0.44–1.00)
Chloride: 105 mmol/L (ref 101–111)
GLUCOSE: 100 mg/dL — AB (ref 65–99)
Potassium: 3.6 mmol/L (ref 3.5–5.1)
Sodium: 141 mmol/L (ref 135–145)

## 2016-03-26 MED ORDER — ACETAMINOPHEN 325 MG PO TABS: 325.0000 mg | ORAL_TABLET | ORAL | Status: DC | PRN

## 2016-03-26 MED ORDER — HYDROXYCHLOROQUINE SULFATE 200 MG PO TABS: 200.0000 mg | ORAL_TABLET | Freq: Two times a day (BID) | ORAL | Status: DC

## 2016-03-26 MED ORDER — OXYCODONE HCL 15 MG PO TABS: 15.0000 mg | ORAL_TABLET | ORAL | Status: DC | PRN

## 2016-03-26 MED ORDER — DOXYCYCLINE HYCLATE 100 MG PO TABS
100.0000 mg | ORAL_TABLET | Freq: Two times a day (BID) | ORAL | Status: DC
  Administered 2016-03-26: 100 mg via ORAL
  Filled 2016-03-26: qty 1

## 2016-03-26 MED ORDER — HEPARIN SOD (PORK) LOCK FLUSH 100 UNIT/ML IV SOLN
500.0000 [IU] | INTRAVENOUS | Status: AC | PRN
  Administered 2016-03-26: 500 [IU]

## 2016-03-26 NOTE — Care Management Note (Signed)
Case Management Note  Patient Details  Name: Omega Durante MRN: [id number] Date of Birth: 02/28/1966  Subjective/Objective:   50 yo F with recurrent R hip fluid collection setting of chronic osteomyelitis of the R hip.            Action/Plan: received referral to assist pt with transportation   Expected Discharge Date:    03/26/16              Expected Discharge Plan:  Home/Self Care  In-House Referral:  Clinical Social Work  Discharge planning Services  CM Consult  Post Acute Care Choice:    Choice offered to:     DME Arranged:    DME Agency:     HH Arranged:    HH Agency:     Status of Service:  Completed, signed off  Medicare Important Message Given:    Date Medicare IM Given:    Medicare IM give by:    Date Additional Medicare IM Given:    Additional Medicare Important Message give by:     If discussed at [place] of Stay Meetings, dates discussed:    Additional Comments: met with pt at bedside. She plans to return home with the support of her mother. She has a W/C, shower chair, 3-in-1 BSC, and a RW. Informed her that the SW will assist her with transportation. Contacted Erda, SW and left a VM regarding transportation issue.  Norina Buzzard, RN 03/26/2016, 9:25 AM

## 2016-03-26 NOTE — Progress Notes (Signed)
PT Cancellation Note  Patient Details Name: Imagene Luberda MRN: [id number] DOB: 1966-05-01   Cancelled Treatment:    Reason Eval/Treat Not Completed: Other (comment) (patient declined need to practice steps. no further needs.) plans DC today.    Claretha Cooper 03/26/2016, 12:51 PM

## 2016-03-26 NOTE — Discharge Summary (Signed)
Name: Katherine Banks MRN: [id number] DOB: 02-02-1966 50 y.o. PCP: [name], MD  Date of Admission: 03/22/2016  3:41 PM Date of Discharge: 03/26/2016 Attending Physician: Sid Falcon, MD  Discharge Diagnosis: Principal Problem:   Septic arthritis [facility]) Active Problems:   Lupus (systemic lupus erythematosus) (HCC)   HTN (hypertension)   Asthma   Abscess of right hip   Pain in the chest  Discharge Medications:   Medication List    STOP taking these medications        diphenhydrAMINE 25 MG tablet  Commonly known as:  SOMINEX     multivitamin-iron-minerals-folic acid Tabs tablet      TAKE these medications        acetaminophen 325 MG tablet  Commonly known as:  TYLENOL  Take 1 tablet (325 mg total) by mouth every 4 (four) hours as needed for moderate pain (alternate with oxycodone as needed).     albuterol (2.5 MG/3ML) 0.083% nebulizer solution  Commonly known as:  PROVENTIL  Take 3 mLs (2.5 mg total) by nebulization every 8 (eight) hours.     Amlodipine-Valsartan-HCTZ 10-160-25 MG Tabs  Take 1 tablet by mouth daily.     cloNIDine 0.2 MG tablet  Commonly known as:  CATAPRES  Take 1 tablet (0.2 mg total) by mouth 2 (two) times daily.     colesevelam 625 MG tablet  Commonly known as:  WELCHOL  Take 1,250 mg by mouth daily as needed. Reported on 03/14/2016     cyanocobalamin 500 MCG tablet  Take 1 tablet (500 mcg total) by mouth daily.     cyclobenzaprine 10 MG tablet  Commonly known as:  FLEXERIL  Take 10 mg by mouth 3 (three) times daily as needed for muscle spasms.     diclofenac sodium 1 % Gel  Commonly known as:  VOLTAREN  Apply 1 application topically daily as needed.     dicyclomine 20 MG tablet  Commonly known as:  BENTYL  TAKE 1 TABLET BY MOUTH TWICE A DAY AS NEEDED     docusate sodium 100 MG capsule  Commonly known as:  COLACE  Take 1 capsule (100 mg total) by mouth daily.     doxycycline 100 MG tablet  Commonly known as:  VIBRA-TABS    Take 1 tablet (100 mg total) by mouth 2 (two) times daily.     fluticasone 50 MCG/ACT nasal spray  Commonly known as:  FLONASE  Place 1 spray into both nostrils daily.     gabapentin 400 MG capsule  Commonly known as:  NEURONTIN  Take 2 capsules (800 mg total) by mouth 3 (three) times daily.     hydroxychloroquine 200 MG tablet  Commonly known as:  PLAQUENIL  Take 1 tablet (200 mg total) by mouth 2 (two) times daily.     montelukast 10 MG tablet  Commonly known as:  SINGULAIR  Take 10 mg by mouth daily. Reported on 03/14/2016     omeprazole 20 MG capsule  Commonly known as:  PRILOSEC  Take 1 capsule (20 mg total) by mouth 2 (two) times daily before a meal.     ondansetron 4 MG disintegrating tablet  Commonly known as:  ZOFRAN-ODT  Take 4 mg by mouth every 6 (six) hours as needed for nausea. Reported on 01/18/2016     oxyCODONE 15 MG immediate release tablet  Commonly known as:  ROXICODONE  Take 1 tablet (15 mg total) by mouth every 4 (four) hours as needed for pain. May  fill prescription on 03/26/16.     predniSONE 20 MG tablet  Commonly known as:  DELTASONE  TAKE 2 TABLETS BY MOUTH IN THE MORNING     senna-docusate 8.6-50 MG tablet  Commonly known as:  Senokot-S  Take 2 tablets by mouth 2 (two) times daily.     SYMBICORT 80-4.5 MCG/ACT inhaler  Generic drug:  budesonide-formoterol  Inhale 2 puffs into the lungs 2 (two) times daily.     zolpidem 10 MG tablet  Commonly known as:  AMBIEN  Take 1 tablet (10 mg total) by mouth at bedtime. Reported on 01/18/2016        Disposition and follow-up:   Katherine Banks was discharged from [facility] in Stable condition.  At the hospital follow up visit please address:  Non-infectious right hip fluid collection: Fluro guided drainage. Cultures with no growth. ID recommended continuing doxycyline x 3 months for her chronic infections. Increased dose of oxycodone from 15 mg q6hr prn to q4hr prn. Please check  UDS at follow up visit. Was receiving dilaudid during hospitalization as well as a dose of fentanyl.  SLE: Unable to establish with rhuem due to insurance issues after moving from [place]. May be able to establish now. Please persue rheum referral. Continued Plaquenil 200 mg BID, Prednisone 40 mg po daily, Gabapentin 800 mg TID. Consider monitoring disease activity with ds-DNA. Acute Urinary Retention: ? 2/2 diphenydramine use but unclear. Resolved. Please assess for any urinary symptoms at follow up. Instructed to avoid Benadryl.   2.  Labs / imaging needed at time of follow-up: UDS  3.  Pending labs/ test needing follow-up: None  Follow-up Appointments: Follow-up Information    Schedule an appointment as soon as possible for a visit with [name], MD.   Specialty:  Internal Medicine   Contact information:   [place] [id number] [phone]       Discharge Instructions: Discharge Instructions    Diet - low sodium heart healthy    Complete by:  As directed      Increase activity slowly    Complete by:  As directed            Consultations:    Procedures Performed:  Dg Chest 2 View  03/22/2016  CLINICAL DATA:  Chest Pain, Joint Pain, Leukocytosis, and Extreme Fatigue - Unable to stand. Nausea. Hx HTN, Lupus, Asthma, and right breast mass. EXAM: CHEST  2 VIEW COMPARISON:  11/19/2015 FINDINGS: Power injectable Port-A-Cath tip:  Lower SVC. Cardiac and mediastinal margins appear normal. Linear scarring in the left mid lung. IMPRESSION: 1. Linear scarring in the left mid lung appear stable. Lungs appear otherwise clear. 2. Port-A-Cath tip:  SVC. Electronically Signed   By: Van Clines M.D.   On: 03/22/2016 15:14   Ct Hip Left W Contrast  03/22/2016  CLINICAL DATA:  Bilateral hip pain. History of right hip arthroplasty in removal, subsequent infections. EXAM: CT OF THE LEFT HIP WITH CONTRAST; CT OF THE RIGHT HIP WITH CONTRAST TECHNIQUE: Multidetector CT  imaging was performed following the standard protocol during bolus administration of intravenous contrast. CONTRAST:  159m ISOVUE-300 IOPAMIDOL (ISOVUE-300) INJECTION 61% COMPARISON:  Radiographs earlier this day.  Right hip CT 11/28/2015. FINDINGS: CT right hip: Postsurgical change post resection of right hip arthroplasty. Fluid density within the right hip joint is unchanged from prior exam, currently 7.3 cm between the femur and acetabulum. No internal air. Deformity in the right proximal femur with the appearance of  chronic osteomyelitis. A small osseous fragment measuring 1.3 cm within the central cavity may be a bony sequestrum, unchanged in appearance from prior. Curvilinear fragmented ossification superior to the greater trochanter has increased in the interim. Deep to this fragmentation is not well-defined region of low density, measuring approximately 2.4 x 1.8 x 4.7 cm, site of previous phlegmon. Exact measurements difficult due to the irregular nature. Soft tissue thickening posteriorly tracking to the skin surface with associated osseous densities with some interval contraction. Scarring about the anterior lateral right hip soft tissues at site of prior air fluid collection. There is no acute fracture. Prominent right inguinal, internal and external iliac lymph nodes again seen. 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 There is 30-50% involvement of the femoral head articular surface. The degree of involvement is unchanged from prior CT. No significant hip joint space narrowing. Pubic rami are intact. Mild vacuum phenomenon on the left sacroiliac joint. Soft tissue evaluation of the pelvis demonstrates right inguinal and external iliac adenopathy, likely reactive. An IVC filter is in place. No intrapelvic fluid collection. IMPRESSION: CT right hip: 1. Irregular fluid collection (2.4 x 1.8 x 4.7 cm) about the greater trochanter with peripheral  fragmented ossification, may reflect acute infection superimposed on chronic osteomyelitis. 2. Otherwise stable bony deformities and postsurgical change of the right proximal femur and right hip joint. CT left hip: 1. 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 This is unchanged in appearance from prior. 2. No new abnormality of the left hip. Electronically Signed   By: Jeb Levering M.D.   On: 03/22/2016 22:32   Ct Hip Right W Contrast  03/22/2016  CLINICAL DATA:  Bilateral hip pain. History of right hip arthroplasty in removal, subsequent infections. EXAM: CT OF THE LEFT HIP WITH CONTRAST; CT OF THE RIGHT HIP WITH CONTRAST TECHNIQUE: Multidetector CT imaging was performed following the standard protocol during bolus administration of intravenous contrast. CONTRAST:  122m ISOVUE-300 IOPAMIDOL (ISOVUE-300) INJECTION 61% COMPARISON:  Radiographs earlier this day.  Right hip CT 11/28/2015. FINDINGS: CT right hip: Postsurgical change post resection of right hip arthroplasty. Fluid density within the right hip joint is unchanged from prior exam, currently 7.3 cm between the femur and acetabulum. No internal air. Deformity in the right proximal femur with the appearance of chronic osteomyelitis. A small osseous fragment measuring 1.3 cm within the central cavity may be a bony sequestrum, unchanged in appearance from prior. Curvilinear fragmented ossification superior to the greater trochanter has increased in the interim. Deep to this fragmentation is not well-defined region of low density, measuring approximately 2.4 x 1.8 x 4.7 cm, site of previous phlegmon. Exact measurements difficult due to the irregular nature. Soft tissue thickening posteriorly tracking to the skin surface with associated osseous densities with some interval contraction. Scarring about the anterior lateral right hip soft tissues at site of prior air fluid collection. There is no acute fracture. Prominent right  inguinal, internal and external iliac lymph nodes again seen. 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 There is 30-50% involvement of the femoral head articular surface. The degree of involvement is unchanged from prior CT. No significant hip joint space narrowing. Pubic rami are intact. Mild vacuum phenomenon on the left sacroiliac joint. Soft tissue evaluation of the pelvis demonstrates right inguinal and external iliac adenopathy, likely reactive. An IVC filter is in place. No intrapelvic fluid collection. IMPRESSION: CT right hip: 1.  Irregular fluid collection (2.4 x 1.8 x 4.7 cm) about the greater trochanter with peripheral fragmented ossification, may reflect acute infection superimposed on chronic osteomyelitis. 2. Otherwise stable bony deformities and postsurgical change of the right proximal femur and right hip joint. CT left hip: 1. 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 This is unchanged in appearance from prior. 2. No new abnormality of the left hip. Electronically Signed   By: Jeb Levering M.D.   On: 03/22/2016 22:32   Ir Fluoro Guide Ndl Plmt / Bx  03/23/2016  INDICATION: History of septic arthritis, post removal of right total hip replacement, now with indeterminate fluid collection about the right hip operative site. EXAM: ARTHROCENTESIS/INJECTION OF LARGE JOINT COMPARISON:  Right hip CT - 03/22/2016 CONTRAST:  None FLUOROSCOPY TIME:  1 minutes, 12 seconds (7 mGy) ANESTHESIA/SEDATION: Moderate (conscious) sedation was employed during this procedure. A total of Versed 1 mg and Fentanyl 25 mcg was administered intravenously. Moderate Sedation Time: 10 minutes. The patient's level of consciousness and vital signs were monitored continuously by radiology nursing throughout the procedure under my direct supervision. COMPLICATIONS: None immediate PROCEDURE: Informed written consent was obtained from the patient after  discussion of the risks, benefits and alternatives to treatment. The patient was placed prone on the fluoroscopy table and the right extremity was placed in a slight degree of flexion and internal rotation. The residual proximal end of the right femur was marked fluoroscopically. The skin overlying the anterior aspect of the hip was prepped and draped in usual sterile fashion. An 18 gauge spinal needle was advanced into the soft tissues about the cranial end of the residual femur after the overlying soft tissues were anesthetized with 1% lidocaine. A fluoroscopic image was saved and sent to PACs. A small amount of saline was injected into the joint space and aspirated. The syringe was capped and sent to the laboratory for analysis as ordered by the clinical team. The needle was removed and a dressing was placed. The patient tolerated procedure well without immediate postprocedural complication. IMPRESSION: Successful fluoroscopic guided aspiration of the right hip yielding approximately 6 cc of dark red, bloody, non foul smelling fluid. All aspirated fluid was sent to the laboratory for analysis. Electronically Signed   By: Sandi Mariscal M.D.   On: 03/23/2016 17:51   Dg Hips Bilat With Pelvis 3-4 Views  03/22/2016  CLINICAL DATA:  Pain and multiple infections since hip arthroplasty in 2015 EXAM: DG HIP (WITH OR WITHOUT PELVIS) 3-4V BILAT COMPARISON:  CT RIGHT hip 11/28/2015 FINDINGS: Deformity of proximal RIGHT femur post hip arthroplasty and harbor excision. Absent femoral head with superior subluxation of the RIGHT femoral shaft. Bones appear demineralized. No acute fracture or additional dislocation. Questionable area of new bone destruction at the greater trochanteric region when compared to the prior CT versus differences in imaging technique or overlying soft tissue gas. No other areas of potential bone destruction are identified. Few foci of soft tissue gas are noted at the LEFT hip. Additional a amorphous  material is seen superolateral to the proximal residual shaft of the prosthesis, uncertain if represents soft tissue calcification or superimposed artifact. IMPRESSION: Significant deformity of proximal RIGHT femur post femoral head resection, hip arthroplasty and removal of hardware. Lucency at the greater trochanteric region could represent bone destruction, overlying soft tissue gas or differences in imaging technique versus prior CT. Suspected developing soft tissue calcification adjacent to the residual proximal RIGHT femur. Pelvis appears grossly intact. Electronically Signed  By: Lavonia Dana M.D.   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 Patient states that she has chronic right hip complications with 25 surgical procedures, 1st one being a total hip arthroplasty in March 2012. She had recurrent dislocations and infections even after prostheses were removed and replaced. It was assumed that she was rejecting the hip prostheses and she now has no equipment in the right hip. She was admitted from 1/19-1/13 of this year for an abscess at the right hip found to be MRSA positive on culture. She underwent I&D twice that admission and was discharged on 6 week IV vancomycin and has been on Doxycycline since then.   Patient states that about 2 weeks ago, she felt that she was having another recurrence of her right hip infection with gradual increase in pain, swelling, malaise, and weakness. She is unable to bear weight on her right leg and mobility is now limited, often times requiring a wheelchair. She feels the infection has worsened to the  point that it has kicked off a lupus flare which is presenting itself with diffuse joint pain, facial rash, and pressure-like central chest pain. She was set up with a rheumatology appointment at [facility], but this was cancelled due to insurance reasons per patient. She still has no rheumatologist. She reports adherence to all her medications. She reports associated nausea without vomiting, decreased urination, diarrhea, numbness in her right fingertips, and decreased appetite. She denies fevers, although she says she never gets fevers.  In the ED, lab work was notable for mild leukocytosis with white count of 13k, mildly elevated ESR of 27, negative troponin, and negative UA. CT of both hips were obtained which showed unchanged avascular necrosis of the femoral head of the left hip and an irregular fluid collection at the right hip concerning for acute infection superimposed on chronic osteomyelitis. She has required dilaudid for pain control.  Social Hx: Former smoker 1 pack/week for 10 years, quit 25 years ago. No alcohol or illicit drug use.  Family Hx: Uterine Cancer MGM, sister, mother. Stomach cancer in father. Lupus in cousin.  Hospital Course by problem list:  Acute recurrent right hip fluid collection in setting of chronic osteomyelitis of the right hip: Patient with multiple recurrences of right hip abscesses requiring surgical intervention, most recently undergoing I&D twice in January 2017. Found to have MRSA and completed 6 weeks IV vanc and continued Doxycycline until admission. CT of right hip showed an irregular fluid collection suspicious for acute infection. Patient had a mild leukocytosis, but has chronic leukocytosis from chronic prednisone use for SLE. Fluro guided I&D done with cuture with no growth final culutre. ID consulted and recommend continuing her doxycycline 100 mg BID course for suppression x3 months. I'm note sure that multiple I&D's are going to be helpful at this standpoint  with out removing substantially more bone and soft-tissue. She would still end up with likely fluid collections and still in chronic pain.  SLE: Patient feelt that her lupus is flaring up with symptoms of generalized arthralgias, malar rash, hair loss, and central chest pain. She is on chronic prednisone and Plaquenil at  home. Unfortunately her immunosuppressed status and long-term steroid use is likely the cause of her avascular necrosis and recurrent hip infections. She does not have a rheumatologist at this time, but a referral was placed earlier this year. Unable to be seen due to insurance issues. She will need to be plugged in with rheum for continued management of her lupus and medication adjustment. Continued Plaquenil 200 mg BID, Prednisone 40 mg po daily, Gabapentin 800 mg TID. Consider monitoring disease activity with ds-DNA.  Acute Urinary Retention: Patient reporting that she had been unable to void for 2 days prior to admission. Has never any any issues with urination prior to this. She reports occasional blood in her urine over the past 2 weeks as well. UA was clear with no blood on admission.. Did report taking diphenhydramine around the time of her urinary retention. She takes chronic opiates at home, Oxycodone. Voiding trial successful. Patient able to void without difficulty. Patient report PVR done with no residual urine but cannot find any documentation of PVR being measured. Suspect this was related to her diphenhydramine use.Instructed her to avoid using it in the future.   HTN: BP stable during hospitalization. Continued home Amlodipine 10 mg daily, Irbesartan 150 mg daily, HCTZ 25 mg daily, Clonidine 0.2 mg BID  Asthma: No respiratory symptoms.Conintued home medications.  GERD: Protonix 40 mg po qd  Discharge Vitals:   BP 127/71 mmHg  Pulse 68  Temp(Src) 97.9 F (36.6 C) (Oral)  Resp 15  Ht _0  (1.676 m)  Wt 251 lb (113.853 kg)  BMI 40.53 kg/m2  SpO2 98%  Discharge  Labs:  No results found for this or any previous visit (from the past 24 hour(s)).  Signed: Maryellen Pile, MD 03/28/2016, 9:21 PM

## 2016-03-26 NOTE — Progress Notes (Signed)
Subjective: Patient doing well this morning. Continues to complain of bilateral hip pain but controlled with medications. She reports no difficulty with urination. Does note some pain with urination from having the foley in. Denies any dysuria or burning. Just reports it hurts after they tried several times to put the foley unsuccessfully. Wants to go home today. Reports she will need transportation arranged.   Objective: Vital signs in last 24 hours: Filed Vitals:   03/25/16 0805 03/25/16 1340 03/25/16 2118 03/26/16 0621  BP:  117/64 139/74 127/71  Pulse:  90 86 68  Temp:  98.1 F (36.7 C) 97.9 F (36.6 C) 97.9 F (36.6 C)  TempSrc:  Oral Oral Oral  Resp:  18 16 15   Height:      Weight:      SpO2: 97% 98% 100% 98%   Weight change:   Intake/Output Summary (Last 24 hours) at 03/26/16 0837 Last data filed at 03/26/16 0700  Gross per 24 hour  Intake    480 ml  Output      0 ml  Net    480 ml   Constitutional: She is oriented to person, place, and time. She appears well-developed and well-nourished.  Cardiovascular: Normal rate, regular rhythm and intact distal pulses.Soft late systolic murmur at RUS border.  Pulmonary/Chest: Effort normal. No respiratory distress. She has no wheezes. She has no rales.  Abdominal: Soft. She exhibits no distension. There is no guarding. BS+, non-tender. Musculoskeletal:  Multiple surgical scars at right hip that are well healed. Right hip tender to palpation without fluctuance, drainage, or open sores. There is swelling at the right lateral hip without erythema or warmth to touch. ROM is restricted at the right hip and right foot is everted. ROM is intact at left hip with pain elicited during movement. Full ROM of left foot.  Psychiatric: Normal mood and affect  Medications: I have reviewed the patient's current medications. Scheduled Meds: . amLODipine  10 mg Oral Daily   And  . irbesartan  150 mg Oral Daily   And  . hydrochlorothiazide   25 mg Oral Daily  . Chlorhexidine Gluconate Cloth  6 each Topical Q0600  . cloNIDine  0.2 mg Oral BID  . enoxaparin (LOVENOX) injection  40 mg Subcutaneous Q24H  . fluticasone  1 spray Each Nare Daily  . gabapentin  800 mg Oral TID  . hydroxychloroquine  200 mg Oral BID  . mometasone-formoterol  2 puff Inhalation BID  . montelukast  10 mg Oral QHS  . mupirocin ointment   Nasal BID  . oxyCODONE  10 mg Oral Q4H  . pantoprazole  40 mg Oral Daily  . predniSONE  40 mg Oral Q breakfast  . sodium chloride flush  3 mL Intravenous Q12H  . vancomycin  1,000 mg Intravenous Q12H  . cyanocobalamin  500 mcg Oral Daily  . zolpidem  5 mg Oral QHS   Continuous Infusions:  PRN Meds:.acetaminophen, albuterol, HYDROmorphone, ondansetron **OR** ondansetron (ZOFRAN) IV, sodium chloride flush Assessment/Plan:  Recurrent right hip fluid collection setting of chronic osteomyelitis of the right hip: Fluro guided I&D done 5/24. Culture with no growth at 2 days. S/P 3 days of vancomycin prophylactic coverage with history of MRSA joint infections. ID following. Recommend continuing her doxycycline 100 mg BID course for suppression x3 moths. Follow up in ID clinic. Plan to discharge today with close follow up outpatient.  -d/c vanc 5/24 >>5/27 -ID consulted, appreciate assistance -Appreciate Ortho assistance  -Oxycodone IR 10  MG PO q4hr  -hydromorphone 2 mg PO q4hr prn breakthrough -CBC, BMP in am -PT consult -Social work consult  Acute Urinary Retention: Foley out. Patient able to void without difficulty. Patient report PVR done with no residual urine but cannot find any documentation of PVR being measured. Suspect this was related to her diphenhydramine use. Will instruct her to avoid using it in the future.   SLE: Patient feels that her lupus is flaring up with symptoms of generalized arthralgias, malar rash, hair loss, and central chest pain. She is on chronic prednisone and Plaquenil at home. 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 She does not have a rheumatologist at this time, but a referral was placed earlier this year. Unable to be seen due to insurance issues. She will need to be plugged in with rheum for continued management of her lupus and medication adjustment. -Continue Plaquenil 200 mg BID -Continue Prednisone 40 mg po daily -Gabapentin 800 mg TID -f/u on rheum referral  HTN: BP stable.  [id number] systolic.  -Amlodipine 10 mg daily -Irbesartan 150 mg daily -HCTZ 25 mg daily -Clonidine 0.2 mg BID  Asthma: No respiratory symptoms. -Albuterol nebs TID and prn -Dulera BID -Singulair 10 mg qhs -Flonase daily  GERD:  -Protonix 40 mg po qd  Diet: Full diet  DVT ppx: Lovenox  Code: FULL  Dispo: Disposition is deferred at this time, awaiting improvement of current medical problems.    The patient does have a current PCP [name], MD) and does need an [facility] hospital follow-up appointment after discharge.  The patient does not have transportation limitations that hinder transportation to clinic appointments.  .Services Needed at time of discharge: Y = Yes, Blank = No PT:   OT:   RN:   Equipment:   Other:     LOS: 4 days   Maryellen Pile, MD [facility] PGY-1 [phone] 03/26/2016, 8:37 AM

## 2016-03-26 NOTE — Progress Notes (Signed)
Pt requesting ambulance transport home after 1.  Pt confirmed address that's listed in EPIC.  RN aware of d/c home via ambulance after 1.  Bernita Raisin, [place] [facility] [phone]

## 2016-03-26 NOTE — Discharge Instructions (Addendum)
Katherine Banks, the fluid collection around your right hip is not showing any signs of infection. The cultures we drew when we drained it have not grown anything. There is no need to continue you on any IV antibiotics. We will continue you on the oral Doxycyline for another 3 months for prophylaxis given your history of infections.  I have given you a prescription for Oxycodone IR 15 mg pills you can take every 4 hours. Please use your current prescription and do not fill this one until at least Tuesday as the pharmacy may require some authorization to refill the prescription early.  I believe you problem with not being able to urinate was related to taking the Benadryl. Please avoid using this medication in the future.  I would like you to follow up in our clinic downstairs in the next 1-2 weeks. Our clinic should call you to schedule an appointment on Tuesday. If you do not hear from [place] by Wednesday please call the clinic at [phone] to schedule a hospital follow up visit.  If you develop any fevers, chills, nausea/vomiting, uncontrolled pain please call the clinic (after hours on the holiday weekend) or go to the ED for further evaluation.   Doxycycline Drug Information What is this drug used for?  It is used to treat or prevent bacterial infections.  What are some things I need to know or do while I take this drug?  Tell all of your health care providers that you take this drug. This includes your doctors, nurses, pharmacists, and dentists.  Have your blood work checked if you are on this drug for a long time. Talk with your doctor.  This drug may affect certain lab tests. Tell all of your health care providers and lab workers that you take this drug.  Do not take more than what your doctor told you to take. Taking more than you are told may raise your chance of very bad side effects.   If you are taking warfarin, talk with your doctor. You may need to have your blood work checked more closely  while you are taking it with this drug.  You may get sunburned more easily. Avoid sun, sunlamps, and tanning beds. Use sunscreen and wear clothing and eyewear that protects you from the sun.  This drug may cause harm to the unborn baby if you take it while you are pregnant. If you are pregnant or you get pregnant while taking this drug, call your doctor right away.  What are some side effects that I need to call my doctor about right away?  WARNING/CAUTION: Even though it may be rare, some people may have very bad and sometimes deadly side effects when taking a drug. Tell your doctor or get medical help right away if you have any of the following signs or symptoms that may be related to a very bad side effect:  Signs of an allergic reaction, like rash; hives; itching; red, swollen, blistered, or peeling skin with or without fever; wheezing; tightness in the chest or throat; trouble breathing or talking; unusual hoarseness; or swelling of the mouth, face, lips, tongue, or throat.  Signs of liver problems like dark urine, feeling tired, not hungry, upset stomach or stomach pain, light-colored stools, throwing up, or yellow skin or eyes.  Chest pain.  Not able to pass urine or change in how much urine is passed.  Fever or chills.  Sore throat.  Throat irritation.  Trouble swallowing.  Any unexplained bruising or  bleeding.  Joint pain.  Feeling very tired or weak.  Vaginal itching or discharge.  It is common to have diarrhea when taking this drug. Rarely, a very bad form of diarrhea called Clostridium difficile (C diff)-associated diarrhea (CDAD) may occur. Sometimes, this has led to a deadly bowel problem (colitis). CDAD may happen while you are taking this drug or within a few months after you stop taking it. Call your doctor right away if you have stomach pain or cramps, very loose or watery stools, or bloody stools. Do not try to treat loose stools without first checking with your doctor.  A very  bad skin reaction (Stevens-Johnson syndrome/toxic epidermal necrolysis) may happen. It can cause very bad health problems that may not go away, and sometimes death. Get medical help right away if you have signs like red, swollen, blistered, or peeling skin (with or without fever); red or irritated eyes; or sores in your mouth, throat, nose, or eyes. What are some other side effects of this drug?  All drugs may cause side effects. However, many people have no side effects or only have minor side effects. Call your doctor or get medical help if any of these side effects or any other side effects bother you or do not go away:   Not hungry.   Upset stomach or throwing up.   Loose stools (diarrhea).  These are not all of the side effects that may occur. If you have questions about side effects, call your doctor. Call your doctor for medical advice about side effects.  How is this drug best taken?   To gain the most benefit, do not miss doses.  Keep taking this drug as you have been told by your doctor or other health care provider, even if you feel well.  Take on an empty stomach. Drink lots of noncaffeine liquids unless told to drink less liquid by your doctor.  Do not take bismuth (Pepto-Bismol), calcium, iron, magnesium, zinc, multivitamins with minerals, colestipol, cholestyramine, didanosine, or antacids within 2 hours of this drug. This can make the medication not work as effectively. Take with a full glass of water.  Do not lie down for at least 30 minutes after taking this drug.  What do I do if I miss a dose?  Take a missed dose as soon as you think about it.  If it is close to the time for your next dose, skip the missed dose and go back to your normal time.  Do not take 2 doses at the same time or extra doses.

## 2016-03-28 LAB — AEROBIC/ANAEROBIC CULTURE W GRAM STAIN (SURGICAL/DEEP WOUND): Culture: NO GROWTH

## 2016-03-28 LAB — AEROBIC/ANAEROBIC CULTURE (SURGICAL/DEEP WOUND)

## 2016-04-08 ENCOUNTER — Encounter: Payer: Self-pay | Admitting: Internal Medicine

## 2016-04-08 ENCOUNTER — Ambulatory Visit (INDEPENDENT_AMBULATORY_CARE_PROVIDER_SITE_OTHER): Payer: Medicare Other | Admitting: Internal Medicine

## 2016-04-08 VITALS — BP 148/79 | HR 97 | Temp 98.2°F | Wt 262.1 lb

## 2016-04-08 DIAGNOSIS — Z7952 Long term (current) use of systemic steroids: Secondary | ICD-10-CM | POA: Diagnosis not present

## 2016-04-08 DIAGNOSIS — M8668 Other chronic osteomyelitis, other site: Secondary | ICD-10-CM

## 2016-04-08 DIAGNOSIS — I10 Essential (primary) hypertension: Secondary | ICD-10-CM

## 2016-04-08 DIAGNOSIS — Z79891 Long term (current) use of opiate analgesic: Secondary | ICD-10-CM

## 2016-04-08 DIAGNOSIS — M329 Systemic lupus erythematosus, unspecified: Secondary | ICD-10-CM | POA: Diagnosis not present

## 2016-04-08 DIAGNOSIS — M009 Pyogenic arthritis, unspecified: Secondary | ICD-10-CM

## 2016-04-08 DIAGNOSIS — M869 Osteomyelitis, unspecified: Secondary | ICD-10-CM

## 2016-04-11 NOTE — Assessment & Plan Note (Signed)
Since she was seen last month, she says that after starting the amlodipine valsartan HCTZ combo, she became "violent", becoming more irritable with her family members and even getting into an argument with a cashier, which was unlike her. She promptly switched back to amlodipine, lisinopril, and clonidine, her previous regimen, and his had no recurrence of the symptoms. -Consider different combo at next visit -Consider eventual wean of clonidine. 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

## 2016-04-11 NOTE — Assessment & Plan Note (Addendum)
Patient has no complaints today of subjective fevers or chills or worsening hip pain. Patient says pain is stable, well controlled on current regimen since discharge last month. She last took it the day before current visit. She is not taking Benadryl as instructed and this had no urinary retention since discharge -Check UDS today -Check LFTs and CBC for platelet count at next visit and every 6 months as patient is on doxycycline prophylaxis

## 2016-04-11 NOTE — Assessment & Plan Note (Signed)
No evidence of a flare at this time -Placed rheumatology referral today to reestablish now the patient has insurance -Patient has eye exam next week (on Plaquenil). Follow-up eye exam results

## 2016-04-11 NOTE — Progress Notes (Signed)
Internal Medicine Clinic Attending  Case discussed with Dr. Merrilyn Puma at the time of the visit.  We reviewed the resident's history and exam and pertinent patient test results.  I agree with the assessment, diagnosis, and plan of care documented in the resident's note. Patient here for follow up of osteomyelitis.

## 2016-04-11 NOTE — Progress Notes (Signed)
   Patient ID: Katherine Banks female   DOB: Apr 17, 1966 50 y.o.   MRN: [id number]  Subjective:   HPI: [name] is a 50 y.o. 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   Please see problem-based charting for status of medical issues pertinent to this visit.  Review of Systems: Pertinent items noted in HPI and remainder of comprehensive ROS otherwise negative.  Objective:  Physical Exam: Filed Vitals:   04/08/16 0956  BP: 148/79  Pulse: 97  Temp: 98.2 F (36.8 C)  TempSrc: Oral  Weight: 262 lb 1.6 oz (118.888 kg)  SpO2: 99%   Gen: Well-appearing, alert and oriented to person, place, and time HEENT: Oropharynx clear without erythema or exudate.  Neck: No cervical LAD, no thyromegaly or nodules, no JVD noted. CV: Normal rate, regular rhythm, no murmurs, rubs, or gallops Pulmonary: Normal effort, CTA bilaterally, no wheezing, rales, or rhonchi Abdominal: Soft, non-tender, non-distended, without rebound, guarding, or masses Extremities: Distal pulses 2+ in upper and lower extremities bilaterally, no tenderness, erythema or edema Skin: No atypical appearing moles. No rashes  Assessment & Plan:  Please see problem-based charting for assessment and plan.  Blane Ohara, MD Resident Physician, PGY-1 Department of Internal Medicine [facility]

## 2016-04-12 ENCOUNTER — Other Ambulatory Visit: Payer: Self-pay | Admitting: Internal Medicine

## 2016-04-16 LAB — TOXASSURE SELECT,+ANTIDEPR,UR: PDF: 0

## 2016-04-26 ENCOUNTER — Ambulatory Visit (INDEPENDENT_AMBULATORY_CARE_PROVIDER_SITE_OTHER): Payer: Medicare Other | Admitting: Oncology

## 2016-04-26 ENCOUNTER — Encounter: Payer: Self-pay | Admitting: Oncology

## 2016-04-26 VITALS — BP 115/47 | HR 80 | Temp 97.5°F | Ht 66.0 in | Wt 268.2 lb

## 2016-04-26 DIAGNOSIS — Z86711 Personal history of pulmonary embolism: Secondary | ICD-10-CM | POA: Insufficient documentation

## 2016-04-26 DIAGNOSIS — Z95828 Presence of other vascular implants and grafts: Secondary | ICD-10-CM

## 2016-04-26 DIAGNOSIS — M329 Systemic lupus erythematosus, unspecified: Secondary | ICD-10-CM

## 2016-04-26 DIAGNOSIS — R609 Edema, unspecified: Secondary | ICD-10-CM

## 2016-04-26 DIAGNOSIS — R34 Anuria and oliguria: Secondary | ICD-10-CM

## 2016-04-26 DIAGNOSIS — R600 Localized edema: Secondary | ICD-10-CM

## 2016-04-26 DIAGNOSIS — Z7952 Long term (current) use of systemic steroids: Secondary | ICD-10-CM | POA: Diagnosis not present

## 2016-04-26 DIAGNOSIS — D509 Iron deficiency anemia, unspecified: Secondary | ICD-10-CM

## 2016-04-26 DIAGNOSIS — Z86718 Personal history of other venous thrombosis and embolism: Secondary | ICD-10-CM | POA: Diagnosis not present

## 2016-04-26 DIAGNOSIS — I82501 Chronic embolism and thrombosis of unspecified deep veins of right lower extremity: Secondary | ICD-10-CM

## 2016-04-26 DIAGNOSIS — IMO0001 Reserved for inherently not codable concepts without codable children: Secondary | ICD-10-CM

## 2016-04-26 DIAGNOSIS — D72829 Elevated white blood cell count, unspecified: Secondary | ICD-10-CM

## 2016-04-26 HISTORY — DX: Edema, unspecified: R60.9

## 2016-04-26 MED ORDER — FUROSEMIDE 20 MG PO TABS: 20.0000 mg | ORAL_TABLET | Freq: Every day | ORAL | Status: DC

## 2016-04-26 MED ORDER — FERROUS SULFATE 325 (65 FE) MG PO TABS: 325.0000 mg | ORAL_TABLET | Freq: Every day | ORAL | Status: DC
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
## 2016-04-26 NOTE — Patient Instructions (Addendum)
Return tomorrow for lab Start lasix 20 mg (furosemide) water pill 20 mg 1 daily Return again on July 5 to check potassium level We will call you when lab results available and advise you on which blood thinner you need to be on Return visit 1-2 months Schedule follow up visit with [facility] resident within next 3-4 weeks

## 2016-05-02 ENCOUNTER — Ambulatory Visit: Payer: Medicare Other

## 2016-05-04 ENCOUNTER — Ambulatory Visit (INDEPENDENT_AMBULATORY_CARE_PROVIDER_SITE_OTHER): Payer: Medicare Other | Admitting: Internal Medicine

## 2016-05-04 VITALS — BP 162/98 | HR 78 | Temp 98.2°F | Ht 66.0 in | Wt 260.5 lb

## 2016-05-04 DIAGNOSIS — R600 Localized edema: Secondary | ICD-10-CM

## 2016-05-04 DIAGNOSIS — R609 Edema, unspecified: Secondary | ICD-10-CM

## 2016-05-04 DIAGNOSIS — B9689 Other specified bacterial agents as the cause of diseases classified elsewhere: Secondary | ICD-10-CM | POA: Diagnosis not present

## 2016-05-04 DIAGNOSIS — IMO0001 Reserved for inherently not codable concepts without codable children: Secondary | ICD-10-CM

## 2016-05-04 DIAGNOSIS — M8668 Other chronic osteomyelitis, other site: Secondary | ICD-10-CM

## 2016-05-04 DIAGNOSIS — D509 Iron deficiency anemia, unspecified: Secondary | ICD-10-CM

## 2016-05-04 DIAGNOSIS — M869 Osteomyelitis, unspecified: Secondary | ICD-10-CM

## 2016-05-04 DIAGNOSIS — Z86711 Personal history of pulmonary embolism: Secondary | ICD-10-CM | POA: Diagnosis not present

## 2016-05-04 DIAGNOSIS — M329 Systemic lupus erythematosus, unspecified: Secondary | ICD-10-CM

## 2016-05-04 DIAGNOSIS — D72829 Elevated white blood cell count, unspecified: Secondary | ICD-10-CM

## 2016-05-04 DIAGNOSIS — I82501 Chronic embolism and thrombosis of unspecified deep veins of right lower extremity: Secondary | ICD-10-CM

## 2016-05-04 DIAGNOSIS — I10 Essential (primary) hypertension: Secondary | ICD-10-CM

## 2016-05-04 DIAGNOSIS — Z95828 Presence of other vascular implants and grafts: Secondary | ICD-10-CM

## 2016-05-04 MED ORDER — TRAMADOL HCL 50 MG PO TABS: 50.0000 mg | ORAL_TABLET | Freq: Two times a day (BID) | ORAL | Status: DC | PRN

## 2016-05-04 MED ORDER — CLONIDINE HCL 0.2 MG PO TABS: 0.2000 mg | ORAL_TABLET | Freq: Two times a day (BID) | ORAL | Status: DC

## 2016-05-04 MED ORDER — OXYCODONE HCL 15 MG PO TABS: 15.0000 mg | ORAL_TABLET | ORAL | Status: DC | PRN

## 2016-05-04 NOTE — Progress Notes (Signed)
Medicine attending: Medical history, presenting problems, physical findings, and medications, reviewed with resident physician Dr [name] on the day of the patient visit and I concur with his evaluation and management plan. Patient known to me from recent office Hematology consult re recurrent DVT/ hx of caval filter. She needs to be back on anticoagulant but I was concerned that she might have active lupus nephritis with acute hx of bilateral leg swelling & decreased urine output. She failed to report for lab work ordered for day after visit. Lab will be done today. Disposition per Dr Tiburcio Pea above.

## 2016-05-04 NOTE — Assessment & Plan Note (Signed)
BP Readings from Last 3 Encounters:  05/04/16 162/98  04/26/16 115/47  04/08/16 148/79   BP slightly elevated. She is compliant with her meds and wanted refill of clonidine.  -Refilled clonidine. I explained to her that eventually we would like to taper the clonidine down and put her on a different class for better control of her hypertension. She is amenable to that.

## 2016-05-04 NOTE — Assessment & Plan Note (Signed)
She still has not followed up with Rheumatology. I explained to her that it was imperative that she go to the appt. She is on plaquenil.  She has opthalmology appt scheduled for Monday.

## 2016-05-04 NOTE — Assessment & Plan Note (Signed)
Patient saw Dr Beryle Beams on 6/27.  We will check her CMP to see her renal function before deciding on the best anticoagulant for her. If her renal function is normal, then Xarelto would most likely be prescribed. Regardless, she needs to be on an [facility] given her history of recurrent DVTs.

## 2016-05-04 NOTE — Assessment & Plan Note (Signed)
Patient saw Dr Beryle Beams on 6/27, and is here for lab work that was ordered as we were concerned that she may be developing nephrotic syndrome. She says after starting lasix, her edema has improved. She says her urine output is normal only intermittently. She stays well hydrated.   -Check the labs -Follow up as scheduled

## 2016-05-04 NOTE — Assessment & Plan Note (Signed)
Patient is on doxycycline ppx for her osteomyelitis of the hip. She denies any fevers or chills and says she is doing well. She experiences chronic pain due to that, as well as the peripheral leg edema.  Oxycodone was last given on 5/27 #150. She wants her tramadol back as it was being prescribed in [place]. She had an abnormal UDS in April, but the next UDS was normal.   Given her complex pain management, we have referred her to a pain specialist. -Given 1 month supple of oxycodone -Given tramadol 30 tablets. -Explained that we will only refill until she is seen by the pain clinic. -She is getting CBC and CMET drawn for platelet count and liver function as she is on doxy.

## 2016-05-04 NOTE — Patient Instructions (Signed)
Thank you for your visit today Please draw the labs today We have referred you to the pain clinic for your pain management Please continue the lasix  We will call you if we would like you to start the blood thinner
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
## 2016-05-04 NOTE — Addendum Note (Signed)
Addended by: [name] A on: 05/04/2016 11:40 AM   Modules accepted: Orders

## 2016-05-05 LAB — URINALYSIS, ROUTINE W REFLEX MICROSCOPIC
Bilirubin, UA: NEGATIVE
Glucose, UA: NEGATIVE
KETONES UA: NEGATIVE
Leukocytes, UA: NEGATIVE
NITRITE UA: NEGATIVE
PROTEIN UA: NEGATIVE
RBC, UA: NEGATIVE
SPEC GRAV UA: 1.024 (ref 1.005–1.030)
UUROB: 0.2 mg/dL (ref 0.2–1.0)
pH, UA: 6.5 (ref 5.0–7.5)

## 2016-05-05 LAB — COMPREHENSIVE METABOLIC PANEL
ALBUMIN: 3.7 g/dL (ref 3.5–5.5)
ALT: 17 IU/L (ref 0–32)
AST: 24 IU/L (ref 0–40)
Albumin/Globulin Ratio: 1.2 (ref 1.2–2.2)
Alkaline Phosphatase: 120 IU/L — ABNORMAL HIGH (ref 39–117)
BUN / CREAT RATIO: 12 (ref 9–23)
BUN: 11 mg/dL (ref 6–24)
Bilirubin Total: 0.2 mg/dL (ref 0.0–1.2)
CALCIUM: 8.8 mg/dL (ref 8.7–10.2)
CHLORIDE: 104 mmol/L (ref 96–106)
CO2: 21 mmol/L (ref 18–29)
CREATININE: 0.9 mg/dL (ref 0.57–1.00)
GFR, EST AFRICAN AMERICAN: 85 mL/min/{1.73_m2} (ref >59)
GFR, EST NON AFRICAN AMERICAN: 74 mL/min/{1.73_m2} (ref >59)
GLUCOSE: 113 mg/dL — AB (ref 65–99)
Globulin, Total: 3.1 g/dL (ref 1.5–4.5)
Potassium: 3.7 mmol/L (ref 3.5–5.2)
Sodium: 143 mmol/L (ref 134–144)
TOTAL PROTEIN: 6.8 g/dL (ref 6.0–8.5)

## 2016-05-05 LAB — LACTATE DEHYDROGENASE: LDH: 209 IU/L (ref 119–226)

## 2016-05-05 LAB — CBC WITH DIFFERENTIAL/PLATELET
BASOS ABS: 0 10*3/uL (ref 0.0–0.2)
Basos: 0 %
EOS (ABSOLUTE): 0.1 10*3/uL (ref 0.0–0.4)
Eos: 1 %
HEMATOCRIT: 34.8 % (ref 34.0–46.6)
Hemoglobin: 11 g/dL — ABNORMAL LOW (ref 11.1–15.9)
Immature Grans (Abs): 0 10*3/uL (ref 0.0–0.1)
Immature Granulocytes: 0 %
LYMPHS ABS: 3.3 10*3/uL — AB (ref 0.7–3.1)
Lymphs: 31 %
MCH: 23.7 pg — AB (ref 26.6–33.0)
MCHC: 31.6 g/dL (ref 31.5–35.7)
MCV: 75 fL — AB (ref 79–97)
MONOS ABS: 0.3 10*3/uL (ref 0.1–0.9)
Monocytes: 3 %
Neutrophils Absolute: 6.8 10*3/uL (ref 1.4–7.0)
Neutrophils: 65 %
PLATELETS: 393 10*3/uL — AB (ref 150–379)
RBC: 4.64 x10E6/uL (ref 3.77–5.28)
RDW: 19.5 % — AB (ref 12.3–15.4)
WBC: 10.6 10*3/uL (ref 3.4–10.8)

## 2016-05-05 LAB — FERRITIN: Ferritin: 27 ng/mL (ref 15–150)

## 2016-05-05 LAB — IRON AND TIBC
IRON: 26 ug/dL — AB (ref 27–159)
Iron Saturation: 7 % — CL (ref 15–55)
Total Iron Binding Capacity: 369 ug/dL (ref 250–450)
UIBC: 343 ug/dL (ref 131–425)

## 2016-05-05 LAB — LUPUS ANTICOAGULANT PANEL
Dilute Viper Venom Time: 42.8 s (ref 0.0–47.0)
PTT Lupus Anticoagulant: 38.7 s (ref 0.0–51.9)

## 2016-05-05 LAB — RETICULOCYTES: Retic Ct Pct: 1.4 % (ref 0.6–2.6)

## 2016-05-06 ENCOUNTER — Encounter: Payer: Self-pay | Admitting: Vascular Surgery

## 2016-05-11 ENCOUNTER — Ambulatory Visit (HOSPITAL_COMMUNITY)
Admission: RE | Admit: 2016-05-11 | Discharge: 2016-05-11 | Disposition: A | Discharge status: Home / Self Care | Payer: Medicare Other | Source: Ambulatory Visit | Attending: Vascular Surgery | Admitting: Vascular Surgery

## 2016-05-11 ENCOUNTER — Encounter: Payer: Self-pay | Admitting: Vascular Surgery

## 2016-05-11 ENCOUNTER — Ambulatory Visit (INDEPENDENT_AMBULATORY_CARE_PROVIDER_SITE_OTHER): Payer: Medicare Other | Admitting: Vascular Surgery

## 2016-05-11 VITALS — BP 109/64 | HR 60 | Temp 97.6°F | Resp 16 | Ht 66.0 in | Wt 257.0 lb

## 2016-05-11 DIAGNOSIS — Z95828 Presence of other vascular implants and grafts: Secondary | ICD-10-CM

## 2016-05-11 DIAGNOSIS — Z86718 Personal history of other venous thrombosis and embolism: Secondary | ICD-10-CM
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
## 2016-05-15 ENCOUNTER — Other Ambulatory Visit: Payer: Self-pay

## 2016-05-15 ENCOUNTER — Encounter (HOSPITAL_COMMUNITY): Payer: Self-pay

## 2016-05-15 ENCOUNTER — Emergency Department (HOSPITAL_COMMUNITY)
Admission: EM | Admit: 2016-05-15 | Discharge: 2016-05-15 | Disposition: A | Discharge status: Home / Self Care | Payer: Medicare Other | Attending: Emergency Medicine | Admitting: Emergency Medicine

## 2016-05-15 ENCOUNTER — Emergency Department (HOSPITAL_COMMUNITY): Payer: Medicare Other

## 2016-05-15 DIAGNOSIS — Z79899 Other long term (current) drug therapy: Secondary | ICD-10-CM | POA: Diagnosis not present

## 2016-05-15 DIAGNOSIS — Z8542 Personal history of malignant neoplasm of other parts of uterus: Secondary | ICD-10-CM | POA: Insufficient documentation

## 2016-05-15 DIAGNOSIS — Z96641 Presence of right artificial hip joint: Secondary | ICD-10-CM | POA: Diagnosis not present

## 2016-05-15 DIAGNOSIS — J45909 Unspecified asthma, uncomplicated: Secondary | ICD-10-CM | POA: Insufficient documentation

## 2016-05-15 DIAGNOSIS — R112 Nausea with vomiting, unspecified: Secondary | ICD-10-CM | POA: Insufficient documentation

## 2016-05-15 DIAGNOSIS — R079 Chest pain, unspecified: Secondary | ICD-10-CM | POA: Insufficient documentation

## 2016-05-15 DIAGNOSIS — R197 Diarrhea, unspecified: Secondary | ICD-10-CM

## 2016-05-15 DIAGNOSIS — R109 Unspecified abdominal pain: Secondary | ICD-10-CM | POA: Diagnosis not present

## 2016-05-15 DIAGNOSIS — M329 Systemic lupus erythematosus, unspecified: Secondary | ICD-10-CM

## 2016-05-15 DIAGNOSIS — I10 Essential (primary) hypertension: Secondary | ICD-10-CM | POA: Diagnosis not present

## 2016-05-15 DIAGNOSIS — Z87891 Personal history of nicotine dependence: Secondary | ICD-10-CM | POA: Diagnosis not present

## 2016-05-15 DIAGNOSIS — L932 Other local lupus erythematosus: Secondary | ICD-10-CM | POA: Diagnosis not present

## 2016-05-15 LAB — LIPASE, BLOOD: LIPASE: 15 U/L (ref 11–51)

## 2016-05-15 LAB — I-STAT TROPONIN, ED: TROPONIN I, POC: 0 ng/mL (ref 0.00–0.08)

## 2016-05-15 LAB — CBC
HCT: 36 % (ref 36.0–46.0)
HEMOGLOBIN: 11.1 g/dL — AB (ref 12.0–15.0)
MCH: 23.5 pg — AB (ref 26.0–34.0)
MCHC: 30.8 g/dL (ref 30.0–36.0)
MCV: 76.1 fL — ABNORMAL LOW (ref 78.0–100.0)
Platelets: 382 10*3/uL (ref 150–400)
RBC: 4.73 MIL/uL (ref 3.87–5.11)
RDW: 18.1 % — AB (ref 11.5–15.5)
WBC: 9.9 10*3/uL (ref 4.0–10.5)

## 2016-05-15 LAB — COMPREHENSIVE METABOLIC PANEL
ALT: 19 U/L (ref 14–54)
AST: 26 U/L (ref 15–41)
Albumin: 3.8 g/dL (ref 3.5–5.0)
Alkaline Phosphatase: 106 U/L (ref 38–126)
Anion gap: 8 (ref 5–15)
BUN: 14 mg/dL (ref 6–20)
CHLORIDE: 112 mmol/L — AB (ref 101–111)
CO2: 22 mmol/L (ref 22–32)
CREATININE: 0.88 mg/dL (ref 0.44–1.00)
Calcium: 9.5 mg/dL (ref 8.9–10.3)
GFR calc Af Amer: >60 mL/min (ref >60)
GFR calc non Af Amer: >60 mL/min (ref >60)
Glucose, Bld: 95 mg/dL (ref 65–99)
POTASSIUM: 3.4 mmol/L — AB (ref 3.5–5.1)
SODIUM: 142 mmol/L (ref 135–145)
Total Bilirubin: 0.5 mg/dL (ref 0.3–1.2)
Total Protein: 7.8 g/dL (ref 6.5–8.1)

## 2016-05-15 MED ORDER — ONDANSETRON 8 MG PO TBDP: 8.0000 mg | ORAL_TABLET | Freq: Three times a day (TID) | ORAL | Status: DC | PRN

## 2016-05-15 MED ORDER — ONDANSETRON HCL 4 MG/2ML IJ SOLN
4.0000 mg | Freq: Once | INTRAMUSCULAR | Status: AC
Start: 2016-05-15 — End: 2016-05-15
  Administered 2016-05-15: 4 mg via INTRAVENOUS
  Filled 2016-05-15: qty 2

## 2016-05-15 MED ORDER — FENTANYL CITRATE (PF) 100 MCG/2ML IJ SOLN
100.0000 ug | Freq: Once | INTRAMUSCULAR | Status: AC
  Administered 2016-05-15: 100 ug via INTRAVENOUS
  Filled 2016-05-15: qty 2

## 2016-05-15 MED ORDER — SODIUM CHLORIDE 0.9 % IV BOLUS (SEPSIS)
1000.0000 mL | Freq: Once | INTRAVENOUS | Status: AC
  Administered 2016-05-15: 1000 mL via INTRAVENOUS

## 2016-05-15 MED ORDER — HYDROMORPHONE HCL 1 MG/ML IJ SOLN
1.0000 mg | Freq: Once | INTRAMUSCULAR | Status: AC
  Administered 2016-05-15: 1 mg via INTRAVENOUS
  Filled 2016-05-15: qty 1

## 2016-05-15 NOTE — ED Notes (Signed)
Patient drinking water without difficulty.  States she feels like her appetite is returning to normal.

## 2016-05-15 NOTE — Discharge Instructions (Signed)

## 2016-05-15 NOTE — ED Notes (Addendum)
Patient complains of 5 days of central chest pain with nausea, vomiting, diarrhea and joint pain. Has been trying otc anti-diarrheal with no relief. Describes the pain to chest as sharp, no radiation to arm or back.
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
## 2016-05-24 ENCOUNTER — Other Ambulatory Visit: Payer: Self-pay | Admitting: Internal Medicine

## 2016-05-25 ENCOUNTER — Telehealth: Payer: Self-pay | Admitting: Internal Medicine

## 2016-05-25 NOTE — Telephone Encounter (Signed)
Amlodipine-Valsartan-HCTZ 10-160-25 MG TABS [facility]

## 2016-06-01 ENCOUNTER — Other Ambulatory Visit: Payer: Self-pay | Admitting: Oncology

## 2016-06-02 ENCOUNTER — Telehealth: Payer: Self-pay | Admitting: *Deleted

## 2016-06-02 MED ORDER — FLUTICASONE FUROATE-VILANTEROL 200-25 MCG/INH IN AEPB: 1.0000 | INHALATION_SPRAY | Freq: Every day | RESPIRATORY_TRACT | 4 refills | Status: DC

## 2016-06-02 NOTE — Telephone Encounter (Signed)
Received faxed PA request from pt's pharmacy for her symbicort inhaler.  Medication is non-preferred.  Per pharmacy [facility] is preferred and they have made pt aware of this and now pt is requesting rx be changed to [facility].  Will send to pcp for review, please advise.Regenia Skeeter, Darlene Cassady8/3/201710:38 AM

## 2016-06-03 NOTE — Addendum Note (Signed)
Addended by: Truddie Crumble on: 06/03/2016 10:58 AM   Modules accepted: Orders

## 2016-06-15 ENCOUNTER — Other Ambulatory Visit: Payer: Self-pay | Admitting: Internal Medicine

## 2016-06-20 ENCOUNTER — Other Ambulatory Visit: Payer: Self-pay | Admitting: Internal Medicine

## 2016-06-30 ENCOUNTER — Inpatient Hospital Stay (HOSPITAL_COMMUNITY)
Admission: EM | Admit: 2016-06-30 | Discharge: 2016-07-08 | DRG: 560 | Disposition: A | Discharge status: Home / Self Care | Payer: Medicare Other | Attending: Student in an Organized Health Care Education/Training Program | Admitting: Student in an Organized Health Care Education/Training Program

## 2016-06-30 ENCOUNTER — Inpatient Hospital Stay (HOSPITAL_COMMUNITY): Payer: Medicare Other

## 2016-06-30 ENCOUNTER — Encounter (HOSPITAL_COMMUNITY): Payer: Self-pay

## 2016-06-30 ENCOUNTER — Emergency Department (HOSPITAL_COMMUNITY): Payer: Medicare Other

## 2016-06-30 ENCOUNTER — Emergency Department (HOSPITAL_COMMUNITY)
Admission: EM | Admit: 2016-06-30 | Discharge: 2016-06-30 | Disposition: A | Discharge status: Home / Self Care | Payer: Medicare Other | Source: Home / Self Care | Attending: Emergency Medicine | Admitting: Emergency Medicine

## 2016-06-30 ENCOUNTER — Encounter (HOSPITAL_COMMUNITY): Payer: Self-pay | Admitting: Emergency Medicine

## 2016-06-30 DIAGNOSIS — M19012 Primary osteoarthritis, left shoulder: Secondary | ICD-10-CM | POA: Diagnosis present

## 2016-06-30 DIAGNOSIS — Z87891 Personal history of nicotine dependence: Secondary | ICD-10-CM

## 2016-06-30 DIAGNOSIS — Z87440 Personal history of urinary (tract) infections: Secondary | ICD-10-CM

## 2016-06-30 DIAGNOSIS — N390 Urinary tract infection, site not specified: Secondary | ICD-10-CM | POA: Diagnosis present

## 2016-06-30 DIAGNOSIS — Z7952 Long term (current) use of systemic steroids: Secondary | ICD-10-CM

## 2016-06-30 DIAGNOSIS — M25551 Pain in right hip: Secondary | ICD-10-CM

## 2016-06-30 DIAGNOSIS — B9689 Other specified bacterial agents as the cause of diseases classified elsewhere: Secondary | ICD-10-CM | POA: Diagnosis not present

## 2016-06-30 DIAGNOSIS — R5383 Other fatigue: Secondary | ICD-10-CM

## 2016-06-30 DIAGNOSIS — Y939 Activity, unspecified: Secondary | ICD-10-CM

## 2016-06-30 DIAGNOSIS — B951 Streptococcus, group B, as the cause of diseases classified elsewhere: Secondary | ICD-10-CM | POA: Diagnosis present

## 2016-06-30 DIAGNOSIS — Z800 Family history of malignant neoplasm of digestive organs: Secondary | ICD-10-CM

## 2016-06-30 DIAGNOSIS — I959 Hypotension, unspecified: Secondary | ICD-10-CM | POA: Diagnosis not present

## 2016-06-30 DIAGNOSIS — M25451 Effusion, right hip: Secondary | ICD-10-CM

## 2016-06-30 DIAGNOSIS — D509 Iron deficiency anemia, unspecified: Secondary | ICD-10-CM | POA: Diagnosis present

## 2016-06-30 DIAGNOSIS — M86151 Other acute osteomyelitis, right femur: Secondary | ICD-10-CM | POA: Diagnosis present

## 2016-06-30 DIAGNOSIS — Z8542 Personal history of malignant neoplasm of other parts of uterus: Secondary | ICD-10-CM

## 2016-06-30 DIAGNOSIS — M009 Pyogenic arthritis, unspecified: Secondary | ICD-10-CM

## 2016-06-30 DIAGNOSIS — Z86711 Personal history of pulmonary embolism: Secondary | ICD-10-CM | POA: Diagnosis present

## 2016-06-30 DIAGNOSIS — Z86718 Personal history of other venous thrombosis and embolism: Secondary | ICD-10-CM | POA: Insufficient documentation

## 2016-06-30 DIAGNOSIS — Z95828 Presence of other vascular implants and grafts: Secondary | ICD-10-CM

## 2016-06-30 DIAGNOSIS — R011 Cardiac murmur, unspecified: Secondary | ICD-10-CM | POA: Diagnosis not present

## 2016-06-30 DIAGNOSIS — M25559 Pain in unspecified hip: Secondary | ICD-10-CM | POA: Diagnosis present

## 2016-06-30 DIAGNOSIS — Z993 Dependence on wheelchair: Secondary | ICD-10-CM | POA: Diagnosis not present

## 2016-06-30 DIAGNOSIS — Z87892 Personal history of anaphylaxis: Secondary | ICD-10-CM

## 2016-06-30 DIAGNOSIS — M329 Systemic lupus erythematosus, unspecified: Secondary | ICD-10-CM | POA: Diagnosis not present

## 2016-06-30 DIAGNOSIS — B9620 Unspecified Escherichia coli [E. coli] as the cause of diseases classified elsewhere: Secondary | ICD-10-CM | POA: Diagnosis present

## 2016-06-30 DIAGNOSIS — I10 Essential (primary) hypertension: Secondary | ICD-10-CM | POA: Diagnosis present

## 2016-06-30 DIAGNOSIS — Z887 Allergy status to serum and vaccine status: Secondary | ICD-10-CM

## 2016-06-30 DIAGNOSIS — T8451XA Infection and inflammatory reaction due to internal right hip prosthesis, initial encounter: Principal | ICD-10-CM | POA: Diagnosis present

## 2016-06-30 DIAGNOSIS — R51 Headache: Secondary | ICD-10-CM | POA: Insufficient documentation

## 2016-06-30 DIAGNOSIS — M170 Bilateral primary osteoarthritis of knee: Secondary | ICD-10-CM | POA: Diagnosis present

## 2016-06-30 DIAGNOSIS — Z888 Allergy status to other drugs, medicaments and biological substances status: Secondary | ICD-10-CM

## 2016-06-30 DIAGNOSIS — Z96649 Presence of unspecified artificial hip joint: Secondary | ICD-10-CM

## 2016-06-30 DIAGNOSIS — B373 Candidiasis of vulva and vagina: Secondary | ICD-10-CM | POA: Diagnosis present

## 2016-06-30 DIAGNOSIS — Z1629 Resistance to other single specified antibiotic: Secondary | ICD-10-CM | POA: Diagnosis present

## 2016-06-30 DIAGNOSIS — J45909 Unspecified asthma, uncomplicated: Secondary | ICD-10-CM | POA: Diagnosis present

## 2016-06-30 DIAGNOSIS — W19XXXA Unspecified fall, initial encounter: Secondary | ICD-10-CM

## 2016-06-30 DIAGNOSIS — M8668 Other chronic osteomyelitis, other site: Secondary | ICD-10-CM | POA: Diagnosis present

## 2016-06-30 DIAGNOSIS — Z9889 Other specified postprocedural states: Secondary | ICD-10-CM

## 2016-06-30 DIAGNOSIS — Z90710 Acquired absence of both cervix and uterus: Secondary | ICD-10-CM

## 2016-06-30 DIAGNOSIS — B9562 Methicillin resistant Staphylococcus aureus infection as the cause of diseases classified elsewhere: Secondary | ICD-10-CM | POA: Diagnosis not present

## 2016-06-30 DIAGNOSIS — D7589 Other specified diseases of blood and blood-forming organs: Secondary | ICD-10-CM | POA: Diagnosis present

## 2016-06-30 DIAGNOSIS — M869 Osteomyelitis, unspecified: Secondary | ICD-10-CM | POA: Diagnosis present

## 2016-06-30 DIAGNOSIS — Z79899 Other long term (current) drug therapy: Secondary | ICD-10-CM

## 2016-06-30 DIAGNOSIS — R296 Repeated falls: Secondary | ICD-10-CM | POA: Diagnosis present

## 2016-06-30 DIAGNOSIS — R42 Dizziness and giddiness: Secondary | ICD-10-CM | POA: Insufficient documentation

## 2016-06-30 DIAGNOSIS — Z885 Allergy status to narcotic agent status: Secondary | ICD-10-CM | POA: Diagnosis not present

## 2016-06-30 DIAGNOSIS — M86651 Other chronic osteomyelitis, right thigh: Secondary | ICD-10-CM | POA: Diagnosis present

## 2016-06-30 DIAGNOSIS — M47896 Other spondylosis, lumbar region: Secondary | ICD-10-CM | POA: Diagnosis present

## 2016-06-30 DIAGNOSIS — Z8049 Family history of malignant neoplasm of other genital organs: Secondary | ICD-10-CM

## 2016-06-30 DIAGNOSIS — L304 Erythema intertrigo: Secondary | ICD-10-CM | POA: Diagnosis present

## 2016-06-30 DIAGNOSIS — M19011 Primary osteoarthritis, right shoulder: Secondary | ICD-10-CM | POA: Diagnosis present

## 2016-06-30 DIAGNOSIS — G894 Chronic pain syndrome: Secondary | ICD-10-CM | POA: Diagnosis present

## 2016-06-30 DIAGNOSIS — D473 Essential (hemorrhagic) thrombocythemia: Secondary | ICD-10-CM | POA: Diagnosis not present

## 2016-06-30 DIAGNOSIS — T8459XA Infection and inflammatory reaction due to other internal joint prosthesis, initial encounter: Secondary | ICD-10-CM

## 2016-06-30 DIAGNOSIS — Z832 Family history of diseases of the blood and blood-forming organs and certain disorders involving the immune mechanism: Secondary | ICD-10-CM

## 2016-06-30 HISTORY — DX: Acute embolism and thrombosis of unspecified deep veins of unspecified lower extremity: I82.409

## 2016-06-30 LAB — BASIC METABOLIC PANEL
Anion gap: 7 (ref 5–15)
BUN: 9 mg/dL (ref 6–20)
CALCIUM: 8.5 mg/dL — AB (ref 8.9–10.3)
CO2: 27 mmol/L (ref 22–32)
CREATININE: 0.84 mg/dL (ref 0.44–1.00)
Chloride: 106 mmol/L (ref 101–111)
GFR calc non Af Amer: >60 mL/min (ref >60)
Glucose, Bld: 132 mg/dL — ABNORMAL HIGH (ref 65–99)
Potassium: 3.5 mmol/L (ref 3.5–5.1)
SODIUM: 140 mmol/L (ref 135–145)

## 2016-06-30 LAB — CBC WITH DIFFERENTIAL/PLATELET
BASOS PCT: 0 %
Basophils Absolute: 0 10*3/uL (ref 0.0–0.1)
EOS ABS: 0.1 10*3/uL (ref 0.0–0.7)
EOS PCT: 1 %
HCT: 26 % — ABNORMAL LOW (ref 36.0–46.0)
HEMOGLOBIN: 8 g/dL — AB (ref 12.0–15.0)
Lymphocytes Relative: 27 %
Lymphs Abs: 3.6 10*3/uL (ref 0.7–4.0)
MCH: 23.5 pg — ABNORMAL LOW (ref 26.0–34.0)
MCHC: 30.8 g/dL (ref 30.0–36.0)
MCV: 76.5 fL — ABNORMAL LOW (ref 78.0–100.0)
MONOS PCT: 4 %
Monocytes Absolute: 0.5 10*3/uL (ref 0.1–1.0)
NEUTROS PCT: 68 %
Neutro Abs: 9.4 10*3/uL — ABNORMAL HIGH (ref 1.7–7.7)
PLATELETS: 562 10*3/uL — AB (ref 150–400)
RBC: 3.4 MIL/uL — ABNORMAL LOW (ref 3.87–5.11)
RDW: 18.2 % — AB (ref 11.5–15.5)
WBC: 13.6 10*3/uL — ABNORMAL HIGH (ref 4.0–10.5)

## 2016-06-30 LAB — I-STAT CHEM 8, ED
BUN: 9 mg/dL (ref 6–20)
CREATININE: 0.8 mg/dL (ref 0.44–1.00)
Calcium, Ion: 1.14 mmol/L — ABNORMAL LOW (ref 1.15–1.40)
Chloride: 103 mmol/L (ref 101–111)
Glucose, Bld: 97 mg/dL (ref 65–99)
HEMATOCRIT: 27 % — AB (ref 36.0–46.0)
Hemoglobin: 9.2 g/dL — ABNORMAL LOW (ref 12.0–15.0)
POTASSIUM: 3.8 mmol/L (ref 3.5–5.1)
SODIUM: 140 mmol/L (ref 135–145)
TCO2: 28 mmol/L (ref 0–100)

## 2016-06-30 LAB — C-REACTIVE PROTEIN: CRP: 18.7 mg/dL — ABNORMAL HIGH (ref <1.0)

## 2016-06-30 LAB — SEDIMENTATION RATE: SED RATE: 112 mm/h — AB (ref 0–22)

## 2016-06-30 MED ORDER — SENNOSIDES-DOCUSATE SODIUM 8.6-50 MG PO TABS
2.0000 | ORAL_TABLET | Freq: Two times a day (BID) | ORAL | Status: DC
  Administered 2016-06-30 – 2016-07-08 (×16): 2 via ORAL
  Filled 2016-06-30 (×16): qty 2

## 2016-06-30 MED ORDER — GABAPENTIN 400 MG PO CAPS: 800.0000 mg | ORAL_CAPSULE | Freq: Three times a day (TID) | ORAL | Status: DC

## 2016-06-30 MED ORDER — ALBUTEROL SULFATE (2.5 MG/3ML) 0.083% IN NEBU: 2.5000 mg | INHALATION_SOLUTION | Freq: Three times a day (TID) | RESPIRATORY_TRACT | Status: DC

## 2016-06-30 MED ORDER — CYCLOBENZAPRINE HCL 10 MG PO TABS
5.0000 mg | ORAL_TABLET | Freq: Two times a day (BID) | ORAL | Status: DC | PRN
  Administered 2016-07-01 – 2016-07-06 (×10): 5 mg via ORAL
  Filled 2016-06-30 (×11): qty 1

## 2016-06-30 MED ORDER — FERROUS SULFATE 325 (65 FE) MG PO TABS
325.0000 mg | ORAL_TABLET | Freq: Every day | ORAL | Status: DC
  Administered 2016-07-01 – 2016-07-05 (×5): 325 mg via ORAL
  Filled 2016-06-30 (×5): qty 1

## 2016-06-30 MED ORDER — MONTELUKAST SODIUM 10 MG PO TABS
10.0000 mg | ORAL_TABLET | Freq: Every day | ORAL | Status: DC
  Administered 2016-07-01 – 2016-07-08 (×8): 10 mg via ORAL
  Filled 2016-06-30 (×8): qty 1

## 2016-06-30 MED ORDER — DOXYCYCLINE HYCLATE 100 MG PO TABS
100.0000 mg | ORAL_TABLET | Freq: Two times a day (BID) | ORAL | Status: DC
  Administered 2016-06-30 – 2016-07-01 (×2): 100 mg via ORAL
  Filled 2016-06-30 (×2): qty 1

## 2016-06-30 MED ORDER — AMLODIPINE BESYLATE 10 MG PO TABS: 10.0000 mg | ORAL_TABLET | Freq: Every day | ORAL | Status: DC

## 2016-06-30 MED ORDER — GABAPENTIN 300 MG PO CAPS
300.0000 mg | ORAL_CAPSULE | Freq: Four times a day (QID) | ORAL | Status: DC
  Administered 2016-06-30 – 2016-07-04 (×12): 300 mg via ORAL
  Filled 2016-06-30 (×11): qty 1

## 2016-06-30 MED ORDER — HEPARIN SODIUM (PORCINE) 5000 UNIT/ML IJ SOLN
5000.0000 [IU] | Freq: Three times a day (TID) | INTRAMUSCULAR | Status: DC
  Administered 2016-06-30 – 2016-07-06 (×14): 5000 [IU] via SUBCUTANEOUS
  Filled 2016-06-30 (×14): qty 1

## 2016-06-30 MED ORDER — ZOLPIDEM TARTRATE 5 MG PO TABS
5.0000 mg | ORAL_TABLET | Freq: Every evening | ORAL | Status: DC | PRN
  Administered 2016-06-30 – 2016-07-07 (×7): 5 mg via ORAL
  Filled 2016-06-30 (×8): qty 1

## 2016-06-30 MED ORDER — GABAPENTIN 300 MG PO CAPS
300.0000 mg | ORAL_CAPSULE | Freq: Four times a day (QID) | ORAL | Status: DC
  Filled 2016-06-30: qty 1

## 2016-06-30 MED ORDER — SODIUM CHLORIDE 0.9% FLUSH
10.0000 mL | INTRAVENOUS | Status: DC | PRN
  Administered 2016-07-01 – 2016-07-05 (×3): 10 mL
  Administered 2016-07-07: 20 mL
  Administered 2016-07-08 (×2): 10 mL
  Filled 2016-06-30 (×6): qty 40

## 2016-06-30 MED ORDER — FENTANYL CITRATE (PF) 100 MCG/2ML IJ SOLN
100.0000 ug | Freq: Once | INTRAMUSCULAR | Status: DC
  Filled 2016-06-30: qty 2

## 2016-06-30 MED ORDER — ACETAMINOPHEN 650 MG RE SUPP
650.0000 mg | Freq: Four times a day (QID) | RECTAL | Status: DC | PRN
Start: 2016-06-30 — End: 2016-07-08

## 2016-06-30 MED ORDER — HYDROMORPHONE HCL 1 MG/ML IJ SOLN
1.0000 mg | Freq: Once | INTRAMUSCULAR | Status: AC
  Administered 2016-06-30: 1 mg via INTRAMUSCULAR

## 2016-06-30 MED ORDER — TRAMADOL HCL 50 MG PO TABS
50.0000 mg | ORAL_TABLET | Freq: Two times a day (BID) | ORAL | Status: DC | PRN
  Administered 2016-07-01 – 2016-07-03 (×4): 50 mg via ORAL
  Filled 2016-06-30 (×4): qty 1

## 2016-06-30 MED ORDER — HYDROMORPHONE HCL 1 MG/ML IJ SOLN
1.0000 mg | Freq: Once | INTRAMUSCULAR | Status: AC
  Administered 2016-06-30: 1 mg via INTRAVENOUS
  Filled 2016-06-30: qty 1

## 2016-06-30 MED ORDER — CLONIDINE HCL 0.2 MG PO TABS
0.2000 mg | ORAL_TABLET | Freq: Two times a day (BID) | ORAL | Status: DC
  Administered 2016-06-30: 0.2 mg via ORAL
  Filled 2016-06-30: qty 1

## 2016-06-30 MED ORDER — VITAMIN B-12 1000 MCG PO TABS
500.0000 ug | ORAL_TABLET | Freq: Every day | ORAL | Status: DC
  Administered 2016-07-01 – 2016-07-02 (×2): 500 ug via ORAL
  Administered 2016-07-03: 09:00:00 via ORAL
  Administered 2016-07-04 – 2016-07-08 (×5): 500 ug via ORAL
  Filled 2016-06-30 (×8): qty 1

## 2016-06-30 MED ORDER — FLUTICASONE FUROATE-VILANTEROL 200-25 MCG/INH IN AEPB
1.0000 | INHALATION_SPRAY | Freq: Every day | RESPIRATORY_TRACT | Status: DC
  Administered 2016-07-01 – 2016-07-06 (×6): 1 via RESPIRATORY_TRACT
  Filled 2016-06-30 (×2): qty 28

## 2016-06-30 MED ORDER — ACETAMINOPHEN 325 MG PO TABS
650.0000 mg | ORAL_TABLET | Freq: Four times a day (QID) | ORAL | Status: DC | PRN
  Administered 2016-07-02 – 2016-07-08 (×6): 650 mg via ORAL
  Filled 2016-06-30 (×6): qty 2

## 2016-06-30 MED ORDER — OXYCODONE-ACETAMINOPHEN 5-325 MG PO TABS
1.0000 | ORAL_TABLET | Freq: Once | ORAL | Status: AC
  Administered 2016-06-30: 1 via ORAL
  Filled 2016-06-30: qty 1

## 2016-06-30 MED ORDER — OXYCODONE HCL 5 MG PO TABS
20.0000 mg | ORAL_TABLET | Freq: Four times a day (QID) | ORAL | Status: DC | PRN
Start: 2016-06-30 — End: 2016-07-01
  Administered 2016-06-30: 20 mg via ORAL
  Filled 2016-06-30: qty 4

## 2016-06-30 MED ORDER — OXYCODONE-ACETAMINOPHEN 5-325 MG PO TABS
1.0000 | ORAL_TABLET | Freq: Once | ORAL | Status: AC
  Administered 2016-06-30: 1 via ORAL

## 2016-06-30 MED ORDER — PREDNISONE 10 MG PO TABS
10.0000 mg | ORAL_TABLET | Freq: Every day | ORAL | Status: AC
  Administered 2016-07-01: 10 mg via ORAL
  Filled 2016-06-30: qty 1

## 2016-06-30 MED ORDER — HYDROXYCHLOROQUINE SULFATE 200 MG PO TABS
200.0000 mg | ORAL_TABLET | Freq: Two times a day (BID) | ORAL | Status: DC
  Administered 2016-06-30 – 2016-07-08 (×16): 200 mg via ORAL
  Filled 2016-06-30 (×17): qty 1

## 2016-06-30 MED ORDER — HYDROMORPHONE HCL 1 MG/ML IJ SOLN
1.0000 mg | Freq: Once | INTRAMUSCULAR | Status: AC
  Administered 2016-06-30: 1 mg via INTRAMUSCULAR
  Filled 2016-06-30: qty 1

## 2016-06-30 MED ORDER — HYDROMORPHONE HCL 1 MG/ML IJ SOLN: 1.0000 mg | Freq: Once | INTRAMUSCULAR | Status: DC
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
## 2016-06-30 NOTE — ED Notes (Signed)
Iv team at bedside  

## 2016-06-30 NOTE — ED Notes (Signed)
Provided pt with a [place] sandwich pack per EDP.

## 2016-06-30 NOTE — ED Provider Notes (Signed)
[facility] DEPT Provider Note   CSN: [id number] Arrival date & time: 06/30/16  1200     History   Chief Complaint Chief Complaint  Patient presents with  . Headache  . Dizziness  . Fall  . Hip Pain    HPI Katherine Banks is a 50 y.o. female.  Katherine Banks is a 50 y.o. Female who presents to the ED complaining of right hip pain and right low back pain after a fall 3 days ago. She reports she was sitting on the toilet and fell asleep falling off the toilet hitting her right hip and head. She denies loss of consciousness. She is wheelchair-bound due to, location is of multiple right hip surgeries. She reports 25 hip surgeries in the past. She reports her artificial hip joint was removed.  She reports she's been feeling very fatigued recently and this is the reason why she fell asleep. She reports taking oxycodone at home for her pain that has not been helping. She reports some numbness overlying her right low back. No radiating pain or numbness.. Patient denies fevers, loss of bladder control, loss of bowel control, urinary symptoms, hematuria, chest pain, shortness of breath, double vision, neck pain, abdominal pain, nausea, vomiting or diarrhea.   The history is provided by the patient and medical records. No language interpreter was used.  Headache   Pertinent negatives include no fever, no shortness of breath, no nausea and no vomiting.  Dizziness  Associated symptoms: headaches   Associated symptoms: no chest pain, no diarrhea, no nausea, no shortness of breath, no vomiting and no weakness   Fall  Associated symptoms include headaches. Pertinent negatives include no chest pain, no abdominal pain and no shortness of breath.  Hip Pain  Associated symptoms include headaches. Pertinent negatives include no chest pain, no abdominal pain and no shortness of breath.    Past Medical History:  Diagnosis Date  . Asthma   . DVT (deep venous thrombosis) ([place])   . Hypertension     . Lupus ([place])     There are no active problems to display for this patient.   Past Surgical History:  Procedure Laterality Date  . ABDOMINAL HYSTERECTOMY    . CHOLECYSTECTOMY    . HIP SURGERY     "25 hip surgeries"    OB History    No data available       Home Medications    Prior to Admission medications   Medication Sig Start Date End Date Taking? Authorizing Provider  Amlodipine-Valsartan-HCTZ 10-160-25 MG TABS Take 1 tablet by mouth daily. 04/28/16  Yes Historical Provider, MD  BREO ELLIPTA 200-25 MCG/INH AEPB Inhale 1 puff into the lungs daily. 06/02/16  Yes Historical Provider, MD  cloNIDine (CATAPRES) 0.2 MG tablet Take 0.2 mg by mouth 2 (two) times daily. 06/09/16  Yes Historical Provider, MD  cyclobenzaprine (FLEXERIL) 10 MG tablet Take 10 mg by mouth every 8 (eight) hours as needed for muscle spasms. 06/16/16  Yes Historical Provider, MD  dicyclomine (BENTYL) 20 MG tablet Take 20 mg by mouth 2 (two) times daily as needed for pain. 05/24/16  Yes Historical Provider, MD  docusate sodium (COLACE) 100 MG capsule Take 100 mg by mouth daily as needed for constipation. 05/25/16  Yes Historical Provider, MD  ferrous sulfate 325 (65 FE) MG tablet Take 325 mg by mouth daily. 06/01/16  Yes Historical Provider, MD  fluticasone (FLONASE) 50 MCG/ACT nasal spray Place 1 spray into both nostrils daily. 06/01/16  Yes Historical Provider,  MD  furosemide (LASIX) 20 MG tablet Take 20 mg by mouth daily as needed for fluid. 06/01/16  Yes Historical Provider, MD  gabapentin (NEURONTIN) 400 MG capsule Take 800 mg by mouth 3 (three) times daily. 06/20/16  Yes Historical Provider, MD  hydroxychloroquine (PLAQUENIL) 200 MG tablet Take 200 mg by mouth 2 (two) times daily. 05/04/16  Yes Historical Provider, MD  omeprazole (PRILOSEC) 20 MG capsule Take 20 mg by mouth 2 (two) times daily. 05/25/16  Yes Historical Provider, MD  ondansetron (ZOFRAN-ODT) 8 MG disintegrating tablet Take 8 mg by mouth every 8 (eight)  hours as needed for nausea. 05/17/16  Yes Historical Provider, MD  oxyCODONE (ROXICODONE) 15 MG immediate release tablet Take 30 mg by mouth 4 (four) times daily. for pain 05/17/16  Yes Historical Provider, MD  predniSONE (DELTASONE) 5 MG tablet Take 5 mg by mouth 4 (four) times daily. 06/15/16  Yes Historical Provider, MD  traMADol (ULTRAM) 50 MG tablet Take 50 mg by mouth every 12 (twelve) hours as needed for pain. 05/06/16  Yes Historical Provider, MD  zolpidem (AMBIEN) 10 MG tablet Take 10 mg by mouth at bedtime as needed. 06/13/16  Yes Historical Provider, MD    Family History No family history on file.  Social History Social History  Substance Use Topics  . Smoking status: Never Smoker  . Smokeless tobacco: Not on file  . Alcohol use No     Allergies   Compazine [prochlorperazine]; Influenza vaccines; Morphine and related; and Toradol [ketorolac tromethamine]   Review of Systems Review of Systems  Constitutional: Positive for fatigue. Negative for chills and fever.  HENT: Negative for congestion, ear discharge, nosebleeds and sore throat.   Eyes: Negative for visual disturbance.  Respiratory: Negative for cough, shortness of breath and wheezing.   Cardiovascular: Negative for chest pain.  Gastrointestinal: Negative for abdominal pain, diarrhea, nausea and vomiting.  Genitourinary: Negative for difficulty urinating, dysuria, frequency, hematuria and urgency.  Musculoskeletal: Positive for arthralgias and back pain. Negative for neck pain.  Skin: Negative for rash.  Neurological: Positive for dizziness and headaches. Negative for syncope, speech difficulty and weakness.     Physical Exam Updated Vital Signs BP 112/65 (BP Location: Right Arm)   Pulse 87   Temp 98.1 F (36.7 C) (Oral)   Resp 21   Ht 5\' 6"  (1.676 m)   Wt 112 kg   SpO2 100%   BMI 39.87 kg/m   Physical Exam  Constitutional: She is oriented to person, place, and time. She appears well-developed and  well-nourished. No distress.  Nontoxic appearing.  HENT:  Head: Normocephalic and atraumatic.  Right Ear: External ear normal.  Left Ear: External ear normal.  Mouth/Throat: Oropharynx is clear and moist.  No visible signs of head trauma.  Eyes: Conjunctivae and EOM are normal. Pupils are equal, round, and reactive to light. Right eye exhibits no discharge. Left eye exhibits no discharge.  Neck: Normal range of motion. Neck supple. No JVD present. No tracheal deviation present.  No midline neck tenderness.  Cardiovascular: Normal rate, regular rhythm, normal heart sounds and intact distal pulses.   Bilateral radial and dorsalis pedis pulses are intact.  Pulmonary/Chest: Effort normal and breath sounds normal. No stridor. No respiratory distress. She has no wheezes. She has no rales.  Lungs are clear to auscultation bilaterally. Symmetric chest expansion bilaterally. No increased work of breathing.  Abdominal: Soft. There is no tenderness. There is no guarding.  Musculoskeletal: She exhibits tenderness. She exhibits no edema  or deformity.  Patient has tenderness over her right lateral lumbar musculature. No midline back tenderness. Patient has tenderness to her right lateral hip without overlying skin changes. No erythema or warmth. No calf edema or tenderness bilaterally.  Lymphadenopathy:    She has no cervical adenopathy.  Neurological: She is alert and oriented to person, place, and time. No cranial nerve deficit. Coordination normal.  Patient is alert and oriented 3. Speech is clear and coherent. EOMs are intact. Vision is grossly intact. Sensation is intact to her bilateral upper and lower extremities. Good grip strengths bilaterally.  Skin: Skin is warm and dry. Capillary refill takes less than 2 seconds. No rash noted. She is not diaphoretic. No erythema. No pallor.  Psychiatric: She has a normal mood and affect. Her behavior is normal.  Nursing note and vitals reviewed.    ED  Treatments / Results  Labs (all labs ordered are listed, but only abnormal results are displayed) Labs Reviewed  I-STAT CHEM 8, ED - Abnormal; Notable for the following:       Result Value   Calcium, Ion 1.14 (*)    Hemoglobin 9.2 (*)    HCT 27.0 (*)    All other components within normal limits  SEDIMENTATION RATE  C-REACTIVE PROTEIN  CBC WITH DIFFERENTIAL/PLATELET  BASIC METABOLIC PANEL  URINALYSIS, ROUTINE W REFLEX MICROSCOPIC (NOT AT [facility])    EKG  EKG Interpretation None       Radiology Dg Lumbar Spine Complete  Result Date: 06/30/2016 CLINICAL DATA:  50 year old multiple prior surgeries involving the right hip with previous infection who fell approximately 5 days ago onto the right hip with progressively worsening lateral right hip pain since that time. EXAM: LUMBAR SPINE - COMPLETE 4+ VIEW COMPARISON:  None. FINDINGS: Five non-rib-bearing lumbar vertebrae with anatomic alignment. No fractures. Mild to moderate disc space narrowing at L3-4 with associated mild endplate hypertrophic changes. Remaining disc spaces well-preserved. No pars defects. No significant facet arthropathy. Sacroiliac joints intact. IVC filter which projects over the low IVC. Large colonic stool burden. IMPRESSION: 1. No acute osseous abnormality. 2. Mild-to-moderate degenerative disc disease and spondylosis at L3-4. Electronically Signed   By: Evangeline Dakin M.D.   On: 06/30/2016 15:47   Dg Hip Unilat With Pelvis 2-3 Views Right  Result Date: 06/30/2016 CLINICAL DATA:  50 year old multiple prior surgeries involving the right hip with previous infection who fell approximately 5 days ago onto the right hip with progressively worsening lateral right hip pain since that time. EXAM: DG HIP (WITH OR WITHOUT PELVIS) 2-3V RIGHT COMPARISON:  None. FINDINGS: Prior surgical excision of the right femoral head. Modeling of the right acetabulum. Large joint effusion suspected. Dystrophic calcifications surrounding the hip  joint and possible intra-articular calcified loose bodies. No evidence of acute fracture. Included AP pelvis demonstrates a normal appearing contralateral left hip. Sacroiliac joints and symphysis pubis intact. IMPRESSION: 1. No acute osseous abnormality. 2. Prior surgical excision of the right femoral head. 3. Large right hip joint effusion is suspected. Electronically Signed   By: Evangeline Dakin M.D.   On: 06/30/2016 15:50    Procedures Procedures (including critical care time)  Medications Ordered in ED Medications  HYDROmorphone (DILAUDID) injection 1 mg (not administered)  oxyCODONE-acetaminophen (PERCOCET/ROXICET) 5-325 MG per tablet 1 tablet (1 tablet Oral Given 06/30/16 1450)  HYDROmorphone (DILAUDID) injection 1 mg (1 mg Intramuscular Given 06/30/16 1626)     Initial Impression / Assessment and Plan / ED Course  I have reviewed the triage vital  signs and the nursing notes.  Pertinent labs & imaging results that were available during my care of the patient were reviewed by me and considered in my medical decision making (see chart for details).  Clinical Course   Patient presented to the emergency department complaining of increasing right hand pain after several falls over the past 3 days. Patient reports she has been feeling generally weak over the past several days. She's had multiple infections to her right hip and has required hospital admissions and IV antibiotics previously. She reports she is concerned this area is infected. On exam the patient is afebrile nontoxic appearing. She has tenderness overlying her right lateral hip as well as her right low back. She is neurovascularly intact. Patient is wheelchair-bound. Patient has seen Dr. Ninfa Linden in the past for procedures and is a patient of Internal medicine teaching service.  Lumbar spine x-ray showed no acute osseous abnormality. Right hip x-ray indicated large right hip joint effusion but no acute osseous abnormality. She  is status post surgical excision of her right femoral head. She was last tapped in 02/2016.  Concern is for infection again. Plan is to admit to medicine and for ortho consult in the am for possible intervention. Patient agrees with plan for admission. She is afebrile and non-toxic appearing.  I consulted with internal medicine teaching service who accepted the patient for admission.  I consulted with Dr. Marlou Sa from ortho who will be by to see the patient.   This patient was discussed with and evaluated by Dr. Venora Maples who agrees with assessment and plan.   Final Clinical Impressions(s) / ED Diagnoses   Final diagnoses:  Right hip pain  Other fatigue    New Prescriptions New Prescriptions   No medications on file        Waynetta Pean, PA-C 06/30/16 [place], MD 06/30/16 [phone]

## 2016-06-30 NOTE — ED Notes (Signed)
Patient transported to X-ray 

## 2016-06-30 NOTE — Progress Notes (Addendum)
Patient known to Dr. Ninfa Linden He has recurrent right hip effusion in the face of multiple prior hip surgeries Plan interventional radiology hip aspiration.  Hold DVT prophylaxis.  I'll inform Dr. Ninfa Linden  the patient is in the hospital  Patient is not septic.  Please hold antibiotics until hip aspiration

## 2016-06-30 NOTE — ED Triage Notes (Addendum)
Charted in error, pt had same name and birthday as another patient.

## 2016-07-01 ENCOUNTER — Inpatient Hospital Stay (HOSPITAL_COMMUNITY): Payer: Medicare Other

## 2016-07-01 DIAGNOSIS — Z8041 Family history of malignant neoplasm of ovary: Secondary | ICD-10-CM

## 2016-07-01 DIAGNOSIS — Z993 Dependence on wheelchair: Secondary | ICD-10-CM

## 2016-07-01 DIAGNOSIS — N3941 Urge incontinence: Secondary | ICD-10-CM

## 2016-07-01 DIAGNOSIS — Z79899 Other long term (current) drug therapy: Secondary | ICD-10-CM

## 2016-07-01 DIAGNOSIS — Z792 Long term (current) use of antibiotics: Secondary | ICD-10-CM

## 2016-07-01 DIAGNOSIS — J45909 Unspecified asthma, uncomplicated: Secondary | ICD-10-CM

## 2016-07-01 DIAGNOSIS — R296 Repeated falls: Secondary | ICD-10-CM

## 2016-07-01 DIAGNOSIS — Z8049 Family history of malignant neoplasm of other genital organs: Secondary | ICD-10-CM

## 2016-07-01 DIAGNOSIS — R350 Frequency of micturition: Secondary | ICD-10-CM

## 2016-07-01 DIAGNOSIS — Z79891 Long term (current) use of opiate analgesic: Secondary | ICD-10-CM

## 2016-07-01 DIAGNOSIS — Z9181 History of falling: Secondary | ICD-10-CM

## 2016-07-01 DIAGNOSIS — I10 Essential (primary) hypertension: Secondary | ICD-10-CM

## 2016-07-01 DIAGNOSIS — Z87891 Personal history of nicotine dependence: Secondary | ICD-10-CM

## 2016-07-01 DIAGNOSIS — M25551 Pain in right hip: Secondary | ICD-10-CM

## 2016-07-01 LAB — URINALYSIS, ROUTINE W REFLEX MICROSCOPIC
BILIRUBIN URINE: NEGATIVE
Glucose, UA: NEGATIVE mg/dL
Hgb urine dipstick: NEGATIVE
KETONES UR: NEGATIVE mg/dL
Leukocytes, UA: NEGATIVE
NITRITE: NEGATIVE
PROTEIN: NEGATIVE mg/dL
Specific Gravity, Urine: 1.025 (ref 1.005–1.030)
pH: 6 (ref 5.0–8.0)

## 2016-07-01 LAB — CBC
HCT: 25.3 % — ABNORMAL LOW (ref 36.0–46.0)
HEMOGLOBIN: 7.6 g/dL — AB (ref 12.0–15.0)
MCH: 23.3 pg — AB (ref 26.0–34.0)
MCHC: 30 g/dL (ref 30.0–36.0)
MCV: 77.6 fL — ABNORMAL LOW (ref 78.0–100.0)
Platelets: 554 10*3/uL — ABNORMAL HIGH (ref 150–400)
RBC: 3.26 MIL/uL — AB (ref 3.87–5.11)
RDW: 18.1 % — ABNORMAL HIGH (ref 11.5–15.5)
WBC: 11.4 10*3/uL — AB (ref 4.0–10.5)

## 2016-07-01 LAB — BASIC METABOLIC PANEL
ANION GAP: 10 (ref 5–15)
BUN: 9 mg/dL (ref 6–20)
CALCIUM: 8.3 mg/dL — AB (ref 8.9–10.3)
CO2: 24 mmol/L (ref 22–32)
Chloride: 105 mmol/L (ref 101–111)
Creatinine, Ser: 0.85 mg/dL (ref 0.44–1.00)
Glucose, Bld: 104 mg/dL — ABNORMAL HIGH (ref 65–99)
Potassium: 4 mmol/L (ref 3.5–5.1)
Sodium: 139 mmol/L (ref 135–145)

## 2016-07-01 LAB — MRSA PCR SCREENING: MRSA by PCR: NEGATIVE

## 2016-07-01 MED ORDER — OXYCODONE HCL 5 MG PO TABS
30.0000 mg | ORAL_TABLET | Freq: Four times a day (QID) | ORAL | Status: DC | PRN
  Administered 2016-07-01 – 2016-07-05 (×14): 30 mg via ORAL
  Filled 2016-07-01 (×14): qty 6

## 2016-07-01 MED ORDER — HYDROMORPHONE HCL 1 MG/ML IJ SOLN
1.0000 mg | Freq: Once | INTRAMUSCULAR | Status: DC
  Filled 2016-07-01: qty 1

## 2016-07-01 MED ORDER — SODIUM CHLORIDE 0.9 % IV BOLUS (SEPSIS)
500.0000 mL | Freq: Once | INTRAVENOUS | Status: AC
  Administered 2016-07-01: 500 mL via INTRAVENOUS

## 2016-07-01 MED ORDER — HYDROMORPHONE HCL 1 MG/ML IJ SOLN
1.0000 mg | Freq: Once | INTRAMUSCULAR | Status: AC
  Administered 2016-07-01: 1 mg via INTRAVENOUS
  Filled 2016-07-01: qty 1

## 2016-07-01 MED ORDER — ENSURE ENLIVE PO LIQD
237.0000 mL | Freq: Two times a day (BID) | ORAL | Status: DC
  Administered 2016-07-02 – 2016-07-08 (×7): 237 mL via ORAL

## 2016-07-01 MED ORDER — LIDOCAINE HCL 1 % IJ SOLN
INTRAMUSCULAR | Status: AC
  Filled 2016-07-01: qty 10

## 2016-07-01 MED ORDER — DICYCLOMINE HCL 20 MG PO TABS: 20.0000 mg | ORAL_TABLET | Freq: Two times a day (BID) | ORAL | Status: DC | PRN

## 2016-07-01 MED ORDER — IOPAMIDOL (ISOVUE-M 200) INJECTION 41%
INTRAMUSCULAR | Status: AC
  Filled 2016-07-01: qty 10

## 2016-07-01 MED ORDER — HYDROMORPHONE HCL 1 MG/ML IJ SOLN
1.0000 mg | INTRAMUSCULAR | Status: DC | PRN
  Administered 2016-07-01 – 2016-07-03 (×12): 1 mg via INTRAVENOUS
  Filled 2016-07-01 (×11): qty 1

## 2016-07-01 MED ORDER — NYSTATIN 100000 UNIT/GM EX POWD
Freq: Two times a day (BID) | CUTANEOUS | Status: DC
  Administered 2016-07-01 – 2016-07-02 (×2): via TOPICAL
  Filled 2016-07-01: qty 15

## 2016-07-01 MED ORDER — FLUCONAZOLE 150 MG PO TABS
150.0000 mg | ORAL_TABLET | Freq: Once | ORAL | Status: AC
  Administered 2016-07-01: 150 mg via ORAL
  Filled 2016-07-01: qty 1

## 2016-07-01 MED ORDER — ALBUTEROL SULFATE (2.5 MG/3ML) 0.083% IN NEBU
2.5000 mg | INHALATION_SOLUTION | RESPIRATORY_TRACT | Status: DC | PRN
Start: 2016-07-01 — End: 2016-07-08

## 2016-07-01 MED ORDER — HYDROMORPHONE HCL 1 MG/ML IJ SOLN
INTRAMUSCULAR | Status: AC
  Filled 2016-07-01: qty 1

## 2016-07-01 MED ORDER — SODIUM CHLORIDE 0.9 % IJ SOLN
INTRAMUSCULAR | Status: AC
  Filled 2016-07-01: qty 20

## 2016-07-01 MED ORDER — LIDOCAINE HCL 1 % IJ SOLN
INTRAMUSCULAR | Status: AC
  Filled 2016-07-01: qty 20

## 2016-07-01 NOTE — Progress Notes (Signed)
MD on call from [facility] hospital was notified about pt's condition of pain but no new orders were obtained at the time. MD pointed out that she had dilaudid less than two hours ago and he recommended that the oxy IR be administered to her and to monitor its effectiveness

## 2016-07-01 NOTE — Progress Notes (Signed)
Patient asked this RN for Voltaren for her back. MD on call page. Awaiting call back

## 2016-07-01 NOTE — Progress Notes (Signed)
   Subjective: Patient was seen and examined this afternoon. She continues to have hip and low back pain, but denies fever and chills. She is understandably frustrated with her situation.  Objective: Vital signs in last 24 hours: Vitals:   07/01/16 0550 07/01/16 0551 07/01/16 0910 07/01/16 1218  BP: (!) 88/38 (!) 90/50  (P) 118/63  Pulse: 63   (P) 77  Resp:    (P) 12  Temp:    (P) 98.6 F (37 C)  TempSrc:    (P) Oral  SpO2:   94% (P) 96%  Weight:      Height:       Physical Exam General: Vital signs reviewed. Patient is obese, in no acute distress and cooperative with exam.  Cardiovascular: RRR, 2/6 early systolic murmur. Pulmonary/Chest: Clear to auscultation bilaterally, no wheezes, rales, or rhonchi. Extremities: Right hip tender to palpation, no open sores or drainage. Previous scars well healed. No lower extremity edema bilaterally, pulses symmetric and intact bilaterally.  Skin: Warm, dry and intact.   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 At last admission 5/17 had non-infectious right hip aspirate; on doxycycline x3 months for chronic infections. R hip XR shows no acute osseus abnormality, does have a large R hip joint effusion. Patient was seen by orthopedics and went for right hip aspiration by IR which was unsuccessful. A small fluid collection around the proximal femur did not yield fluid aspiration. There was considerable soft tissue edema around the proximal femur. MRI showed an abnormal osseous marrow edema in the proximal 9.5 cm of the right femur probably from osteomyelitis with  overlying fluid collection.  -Holding antibiotics -Consider bone biopsy versus treating empirically  -Discuss with Ortho, Dr. Ninfa Linden -Continue home oxycodone 20 mg q6hr; gabapentin 800 TID; Flexeril 5mg  BID PRN -Diluadid 1 mg Q4H prn breakthrough  New Systolic Murmur: 2/6 early systolic ejection murmur. Will evaluate with echo. -Echo  Frequent falls: Patient is wheelchair bound with limited mobility. She reports frequent falls (had 2 in last 5 days) and has had increased fatigue with episodes of falling asleep on the commode and falling.  -PT/OT consult  Vaginal Candidiasis with Intertrigo: -Fluconazole 150 mg po once -Nystatin powder BID  Hypertension: Patient has a history of hypertension on medical management with amlodipine 0.2 mg bid and amlodipine 10 mg daily. -Continue amlodipine 10 mg daily and clonidine 0.2 mg BID  Asthma: Patient has history of asthma on proventil 2.5mg  q8hr, monteleukast 10mg  daily, Breo ellipta daily.  -Continue home meds  Lupus: Patient has history of lupus on plaquenil 200mg  BID and prednisone 10 mg daily. She states she is has established with a rheumatologist Leafy Kindle, [place]) at [facility]. 05/04/2016 no lupus anticoagulant detected. -Continue home medications.  Dispo: Anticipated discharge in approximately 2 day(s).   LOS: 1 day   Martyn Malay, DO PGY-3 Internal Medicine Resident Pager # [phone] 07/01/2016 6:00 PM

## 2016-07-01 NOTE — Progress Notes (Signed)
My noted and orders from last night did not make it into the new merged chart  Patient examined this morning she has right hip pain probable recurrent hip infection with effusion. Plan at this time is to hold IV antibiotics until aspiration is done.  I will request that today again with interventional radiology consult.  I'll also let Dr. Ninfa Linden noted the patient is here.  She may need MRI scan as well is to evaluate for soft tissue component versus bone component of the infection.

## 2016-07-01 NOTE — Progress Notes (Signed)
Initial Nutrition Assessment  DOCUMENTATION CODES:   Severe malnutrition in context of acute illness/injury, Obesity unspecified  INTERVENTION:  Ensure Enlive po BID, each supplement provides 350 kcal and 20 grams of protein.  Recommend snacks TID between meals to increase intake. Will order for patient.   NUTRITION DIAGNOSIS:   Malnutrition (Severe) related to acute illness (Falls, pain) as evidenced by 8 percent weight loss over 1.5 months, energy intake < or equal to 50% for > or equal to 5 days.  GOAL:   Patient will meet greater than or equal to 90% of their needs  MONITOR:   PO intake, Supplement acceptance, I & O's  REASON FOR ASSESSMENT:   Malnutrition Screening Tool    ASSESSMENT:   50yo female with complicated past medical history of R hip replacement in 2012 with multiple revisions and joint infections (wheelchair bound), Lupus, h/o DVT/PE not on anticoagulation, hypertension, and asthma presenting to the ED with increasing R hip pain and right sided lower back pain for about 5 days. She reports that she has not been feeling like herself in the last week, has had fatigue, and reports 2 falls.   Patient reports her appetite has been poor for about 3.5 weeks due to pain and nausea. She has had poor intake (<50% of normal intake). UBW has been 270 lbs since she has been on steroids. Reports 32 lb weight loss in 3.5 weeks. Per chart, patient has had 9.1 kg (8% body weight) loss over 1.5 months (since 7/12).  Meal Completion: 50% of breakfast this morning.  Medications reviewed and include: ferrous sulfate 325 mg daily, senna, vitamin B12 500 mcg daily.  Labs reviewed: Glucose 104.  Nutrition-Focused physical exam completed. Findings are no fat depletion, no muscle depletion, and mild edema. Patient relies on wheelchair; significant pain in right leg.  Patient agreed to try snacks between meals and Ensure Enlive BID.   Discussed plan with RN.   Diet Order:  Diet  regular Room service appropriate? Yes; Fluid consistency: Thin  Skin:  Reviewed, no issues  Last BM:  06/30/2016  Height:   Ht Readings from Last 1 Encounters:  06/30/16 5\' 6"  (1.676 m)    Weight:   Wt Readings from Last 1 Encounters:  06/30/16 237 lb (107.5 kg)    Ideal Body Weight:  59.1 kg  BMI:  Body mass index is 38.25 kg/m.  Estimated Nutritional Needs:   Kcal:  2000-2200  Protein:  110-140 grams  Fluid:  >/= 2 L/day  EDUCATION NEEDS:   Education needs addressed (Small, frequent meals. Adequate intake of protein and calories with examples of foods that will be tolerated with nausea.)  Willey Blade, MS, RD, LDN

## 2016-07-01 NOTE — Progress Notes (Signed)
Pt arrived to the floor, A/O X4. No Respiratory distress were noted. C/O excruciating pain to the right leg. MD on call will be notified.

## 2016-07-02 ENCOUNTER — Other Ambulatory Visit (HOSPITAL_COMMUNITY): Payer: Medicare Other

## 2016-07-02 LAB — CBC
HEMATOCRIT: 25.1 % — AB (ref 36.0–46.0)
HEMOGLOBIN: 7.6 g/dL — AB (ref 12.0–15.0)
MCH: 23.5 pg — AB (ref 26.0–34.0)
MCHC: 30.3 g/dL (ref 30.0–36.0)
MCV: 77.5 fL — ABNORMAL LOW (ref 78.0–100.0)
Platelets: 594 10*3/uL — ABNORMAL HIGH (ref 150–400)
RBC: 3.24 MIL/uL — AB (ref 3.87–5.11)
RDW: 18.1 % — ABNORMAL HIGH (ref 11.5–15.5)
WBC: 11.3 10*3/uL — ABNORMAL HIGH (ref 4.0–10.5)

## 2016-07-02 LAB — URINE CULTURE

## 2016-07-02 LAB — BASIC METABOLIC PANEL
Anion gap: 6 (ref 5–15)
BUN: 10 mg/dL (ref 6–20)
CHLORIDE: 105 mmol/L (ref 101–111)
CO2: 28 mmol/L (ref 22–32)
CREATININE: 0.87 mg/dL (ref 0.44–1.00)
Calcium: 8.7 mg/dL — ABNORMAL LOW (ref 8.9–10.3)
GFR calc non Af Amer: >60 mL/min (ref >60)
Glucose, Bld: 152 mg/dL — ABNORMAL HIGH (ref 65–99)
POTASSIUM: 4.1 mmol/L (ref 3.5–5.1)
Sodium: 139 mmol/L (ref 135–145)

## 2016-07-02 LAB — FERRITIN: Ferritin: 129 ng/mL (ref 11–307)

## 2016-07-02 MED ORDER — NYSTATIN 100000 UNIT/GM EX OINT
TOPICAL_OINTMENT | Freq: Two times a day (BID) | CUTANEOUS | Status: DC
  Administered 2016-07-02 – 2016-07-08 (×12): via TOPICAL
  Filled 2016-07-02 (×2): qty 15

## 2016-07-02 MED ORDER — DICLOFENAC SODIUM 1 % TD GEL
4.0000 g | Freq: Three times a day (TID) | TRANSDERMAL | Status: DC
  Administered 2016-07-02 – 2016-07-08 (×23): 4 g via TOPICAL
  Filled 2016-07-02: qty 100

## 2016-07-02 NOTE — Evaluation (Signed)
Physical Therapy Evaluation Patient Details Name: Katherine Banks MRN: [id number] DOB: 1966-01-01 Today's Date: 07/02/2016   History of Present Illness  Pt is a 50 y.o. 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 Pt now admitted with chronic osteomyelitis in the residual bone in the remaining proximal femur. Possibility of hind-quarter amputation being considered. PMH: seizures, PTSD, lupus, hypertension, DVT, asthma, anxiety.   Clinical Impression  Pt admitted with above diagnosis. Pt currently with functional limitations due to the deficits listed below (see PT Problem List). Pt able to ambulate in room with rw and min guard assistance. Pt will benefit from skilled PT to increase their independence and safety with mobility to allow discharge to home with family assistance. Modification to current recommendations may be necessary depending upon the course of treatment going forward. If the patient does have an amputation, further rehabilitation needed would be anticipated.      Follow Up Recommendations No PT follow up;Supervision for mobility/OOB    Equipment Recommendations  None recommended by PT    Recommendations for Other Services       Precautions / Restrictions Precautions Precautions: Fall Restrictions Weight Bearing Restrictions: No      Mobility  Bed Mobility Overal bed mobility: Needs Assistance Bed Mobility: Supine to Sit     Supine to sit: Supervision;HOB elevated (using rail to assist)        Transfers Overall transfer level: Needs assistance Equipment used: Rolling walker (2 wheeled) Transfers: Sit to/from Stand Sit to Stand: Min guard         General transfer comment: min guard for safety  Ambulation/Gait Ambulation/Gait assistance: Min guard Ambulation Distance (Feet): 25 Feet (25 ft x1, 10 ft x1. ) Assistive device: Rolling walker (2 wheeled) Gait  Pattern/deviations: Step-to pattern;Decreased weight shift to right Gait velocity: decreased   General Gait Details: Pt with difficulty with Rt LE swing phase, noted compensatory strategies used but able to advance independently. Minimal weightbearing through Rt LE.   Stairs            Wheelchair Mobility    Modified Rankin (Stroke Patients Only)       Balance Overall balance assessment: Needs assistance Sitting-balance support: No upper extremity supported Sitting balance-Leahy Scale: Good     Standing balance support: Bilateral upper extremity supported Standing balance-Leahy Scale: Poor Standing balance comment: using rw                             Pertinent Vitals/Pain Pain Assessment: Faces Faces Pain Scale: Hurts little more Pain Location: Rt hip/back Pain Descriptors / Indicators: Grimacing;Guarding Pain Intervention(s): Limited activity within patient's tolerance;Monitored during session    Home Living Family/patient expects to be discharged to:: Private residence Living Arrangements: Parent Available Help at Discharge: Family;Available 24 hours/day Type of Home: House Home Access: Stairs to enter Entrance Stairs-Rails: Psychiatric nurse of Steps: 4 Home Layout: One level Home Equipment: Walker - 4 wheels;Wheelchair - manual Additional Comments: Lives with mother.     Prior Function Level of Independence: Needs assistance   Gait / Transfers Assistance Needed: rollator for short ambulation around the house, w/c for community mobility.   ADL's / Homemaking Assistance Needed: assistance with dressing/bathing         Hand Dominance        Extremity/Trunk Assessment   Upper Extremity Assessment: Defer to OT evaluation  Lower Extremity Assessment: RLE deficits/detail RLE Deficits / Details: Able to advance with compensatory back extension during ambulation. Overall gerneralized weakness throughout. Pt  maintaining LE in externally rotated position.        Communication   Communication: No difficulties  Cognition Arousal/Alertness: Awake/alert Behavior During Therapy: WFL for tasks assessed/performed Overall Cognitive Status: Within Functional Limits for tasks assessed                      General Comments      Exercises        Assessment/Plan    PT Assessment Patient needs continued PT services  PT Diagnosis Difficulty walking   PT Problem List Decreased strength;Decreased range of motion;Decreased mobility;Decreased balance;Decreased activity tolerance  PT Treatment Interventions DME instruction;Gait training;Stair training;Functional mobility training;Therapeutic activities;Therapeutic exercise;Patient/family education   PT Goals (Current goals can be found in the Care Plan section) Acute Rehab PT Goals Patient Stated Goal: eventually live by herself PT Goal Formulation: With patient Time For Goal Achievement: 07/16/16 Potential to Achieve Goals: Good    Frequency Min 3X/week   Barriers to discharge        Co-evaluation               End of Session Equipment Utilized During Treatment: Gait belt Activity Tolerance: Patient limited by fatigue Patient left: in chair;with call bell/phone within reach Nurse Communication: Mobility status         Time: [id number] PT Time Calculation (min) (ACUTE ONLY): 25 min   Charges:   PT Evaluation $PT Eval Moderate Complexity: 1 Procedure     PT G Codes:        Cassell Clement, PT, CSCS Pager [phone] Office [phone]  07/02/2016, 10:28 AM

## 2016-07-02 NOTE — Progress Notes (Signed)
Subjective: Patient was seen and examined this morning. She continues to have pain in her right hip and new right low back pain radiating from her back to her right buttocks. She denies fever or chills.   Objective: Vital signs in last 24 hours: Vitals:   07/01/16 0910 07/01/16 1218 07/01/16 2025 07/02/16 0512  BP:  118/63 (!) 113/59 (!) 102/56  Pulse:  77 82 72  Resp:  12 15 16   Temp:  98.6 F (37 C) 98.2 F (36.8 C) 97.7 F (36.5 C)  TempSrc:  Oral Oral Oral  SpO2: 94% 96% 98% 98%  Weight:      Height:       Physical Exam General: Vital signs reviewed. Patient is obese, in no acute distress and cooperative with exam.  Cardiovascular: RRR, 2/6 early systolic murmur. Pulmonary/Chest: Clear to auscultation bilaterally, no wheezes, rales, or rhonchi. Extremities: Right hip tender to palpation, no open sores or drainage. No lower extremity edema bilaterally, pulses symmetric and intact bilaterally.  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 At last admission 5/17 had non-infectious right hip aspirate; on doxycycline x3 months for chronic infections. R hip XR shows no acute osseus abnormality, does have a large R hip joint effusion. Patient was seen by orthopedics and went for right hip aspiration by IR which was unsuccessful. A small fluid collection around the proximal femur did not yield fluid aspiration. There was considerable soft tissue edema around the proximal femur. MRI showed an abnormal osseous marrow edema in the proximal 9.5 cm of the right femur probably from  osteomyelitis with overlying fluid collection. I discussed her case directly with Dr. Ninfa Linden who knows her well. He plans to discuss her case with his colleagues to determine if surgery is appropriate and which surgical procedure would be indicated (femoral removal, hind-quarter amputation). We have also discussed the case with ID who has kindly agreed to see her. For now, as patient is afebrile and without systemic signs of infection, we will hold antibiotics.  -Holding antibiotics -Consider treating empirically versus surgery -Discussed with Ortho, Dr. Ninfa Linden and ID, Dr. Linus Salmons -Continue home oxycodone 20 mg q6hr; gabapentin 800 TID; Flexeril 5 mg BID PRN -Diluadid 1 mg Q4H prn breakthrough -Voltaren gel  New Systolic Murmur: 2/6 early systolic ejection murmur. Will evaluate with echo. -Echo  Frequent falls: Patient is wheelchair bound with limited mobility. She reports frequent falls (had 2 in last 5 days) and has had increased fatigue with episodes of falling asleep on the commode and falling.  -PT/OT   Chronic Microcytic Anemia: Hemoglobin 7.6 this morning, MCV in 70s. Given concern for iron deficiency anemia, we checked a ferritin- normal at 129. -Repeat CBC tomorrow am  Vaginal Candidiasis with Intertrigo: -Nystatin ointment BID  Hypertension: Patient has a history of hypertension on medical management with clonidine 0.2 mg bid and amlodipine 10 mg daily. -Continue amlodipine 10 mg daily and clonidine 0.2 mg BID  Asthma: Patient has history of asthma on proventil 2.5mg  q8hr, monteleukast 10mg  daily, Breo ellipta daily.  -Continue home meds  Lupus: Patient has history of lupus on plaquenil 200mg   BID and prednisone 10 mg daily. She states she is has established with a rheumatologist Leafy Kindle, [place]) at [facility]. 05/04/2016 no lupus anticoagulant detected. -Continue home medications.  Dispo: Anticipated discharge in approximately 2 day(s).   LOS: 2 days    Martyn Malay, DO PGY-3 Internal Medicine Resident Pager # [phone] 07/02/2016 12:26 PM

## 2016-07-02 NOTE — Progress Notes (Signed)
Patient ID: Katherine Banks, female   DOB: 12/28/1965, 50 y.o.   MRN: [id number] [name] is well known to me.  She has had numerus surgeries in [place] on her right hip due to an infected total hip arthroplasty that was eventually removed due to chronic infection.  She has been on antibiotics for a long periord of time.  She eventually moved down to [facility] and continues to experience chronic pain and recurrence of her infection.  Certainly this is a picture of chronic osteomyelitis in a large area of residual bone in the remaining proximal femur.  The only definitive surgery to likely iradicate the infection would be a hind-quarter amputation.  I will speak to colleagues about this.  I don't think this is a situation where you could remove 9 cm of proximal femur and have a leg that could be supported without risking major damage to nerves and vessels.  This is quite a complex predicament.  I showed her her plain films and tried to explain what the treatment options potentially are and will let her absorb this information.

## 2016-07-02 NOTE — Progress Notes (Signed)
Subjective: Continues to complain of difficult pain overnight, better controlled than previous evening with 1mg  dilaudid for breakthrough. No mention of signs of lupus flares (rash, diffuse joint pain). No fevers. She complains of increased urination with her IV fluids and requests that they be removed.  Objective: Vital signs in last 24 hours: Vitals:   07/01/16 0910 07/01/16 1218 07/01/16 2025 07/02/16 0512  BP:  118/63 (!) 113/59 (!) 102/56  Pulse:  77 82 72  Resp:  12 15 16   Temp:  98.6 F (37 C) 98.2 F (36.8 C) 97.7 F (36.5 C)  TempSrc:  Oral Oral Oral  SpO2: 94% 96% 98% 98%  Weight:      Height:       Weight change:  No intake or output data in the 24 hours ending 07/02/16 1037  Physical Exam: General: no acute distress, blunted affect CV: 2/6 systolic murmur, 1+ bilateral pitting edema Resp: CTA, good air movement throughout Abdomen: soft, non-tender Right hip: tender, no sign of drainage or underlying fluctuance  Lab Results: CBC: WBC 11.3, Hgb 7.6, Hct 25.1, Plts 594 BMP: Na 139, K 4.1, Cl 105, CO2 28, BUN 10, Cr .87, Glucose 152   Micro Results: Recent Results (from the past 240 hour(s))  Urine culture     Status: Abnormal   Collection Time: 06/30/16  8:14 PM  Result Value Ref Range Status   Specimen Description URINE, RANDOM  Final   Special Requests NONE  Final   Culture MULTIPLE SPECIES PRESENT, SUGGEST RECOLLECTION (A)  Final   Report Status 07/02/2016 FINAL  Final  MRSA PCR Screening     Status: None   Collection Time: 06/30/16  9:56 PM  Result Value Ref Range Status   MRSA by PCR NEGATIVE NEGATIVE Final    Comment:        The GeneXpert MRSA Assay (FDA approved for NASAL specimens only), is one component of a comprehensive MRSA colonization surveillance program. It is not intended to diagnose MRSA infection nor to guide or monitor treatment for MRSA infections.    Studies/Results: Dg Chest 2 View  Result Date: 06/30/2016 CLINICAL DATA:   Shortness of breath since this morning EXAM: CHEST  2 VIEW COMPARISON:  May 15, 2016 FINDINGS: The heart size and mediastinal contours are within normal limits. There is no focal infiltrate or pulmonary edema. There are small posterior of bilateral pleural effusions. Right central venous line is identified unchanged. The visualized skeletal structures are stable. IMPRESSION: No pulmonary edema or focal pneumonia. Small bilateral posterior pleural effusions. Electronically Signed   By: Abelardo Diesel M.D.   On: 06/30/2016 17:59   Dg Lumbar Spine Complete  Result Date: 06/30/2016 CLINICAL DATA:  50 year old multiple prior surgeries involving the right hip with previous infection who fell approximately 5 days ago onto the right hip with progressively worsening lateral right hip pain since that time. EXAM: LUMBAR SPINE - COMPLETE 4+ VIEW COMPARISON:  None. FINDINGS: Five non-rib-bearing lumbar vertebrae with anatomic alignment. No fractures. Mild to moderate disc space narrowing at L3-4 with associated mild endplate hypertrophic changes. Remaining disc spaces well-preserved. No pars defects. No significant facet arthropathy. Sacroiliac joints intact. IVC filter which projects over the low IVC. Large colonic stool burden. IMPRESSION: 1. No acute osseous abnormality. 2. Mild-to-moderate degenerative disc disease and spondylosis at L3-4. Electronically Signed   By: Evangeline Dakin M.D.   On: 06/30/2016 15:47   Dg Lumbar Spine Complete  Result Date: 06/30/2016 CLINICAL DATA:  50 year old multiple prior surgeries  involving the right hip with previous infection who fell approximately 5 days ago onto the right hip with progressively worsening lateral right hip pain since that time. EXAM: LUMBAR SPINE - COMPLETE 4+ VIEW COMPARISON:  None. FINDINGS: Five non-rib-bearing lumbar vertebrae with anatomic alignment. No fractures. Mild to moderate disc space narrowing at L3-4 with associated mild endplate hypertrophic  changes. Remaining disc spaces well-preserved. No pars defects. No significant facet arthropathy. Sacroiliac joints intact. IVC filter which projects over the low IVC. Large colonic stool burden. IMPRESSION: 1. No acute osseous abnormality. 2. Mild-to-moderate degenerative disc disease and spondylosis at L3-4. Electronically Signed   By: Evangeline Dakin M.D.   On: 06/30/2016 15:47   Mr Hip Right Wo Contrast  Result Date: 07/01/2016 CLINICAL DATA:  Prosthetic hip infection, status post removal. Right hip pain. EXAM: MR OF THE RIGHT HIP WITHOUT CONTRAST TECHNIQUE: Multiplanar, multisequence MR imaging was performed. No intravenous contrast was administered. COMPARISON:  Multiple exams, including radiographs of 06/30/2016; CT of 03/22/2016; and prior MRI of 11/20/2015 FINDINGS: Initial lead today' s exam was requested with and without contrast. However, the patient terminated the exam after we obtained the first 4 series. Accordingly we were not able to complete the basic noncontrast CT exam or perform the contrasted part of the exam. The right femoral head and neck are absent, as expected in the setting of prior hip arthroplasty with removal of the hardware. There is abnormal edema signal in the proximal 9.5 cm of the femur, including the greater trochanter and proximal shaft along the original site of the stem, as well as bony irregularity in this region suspicious for chronic osteomyelitis. Small metal foci are identified in the region. The prior CT showed a potential sequestrum along the upper part of the stem track, this sequestrum is poorly seen today he due to surrounding metal artifact. Best appreciated on the inversion recovery weighted images, there is a 9.9 by 7.4 by 10.0 cm fluid collection extending from the acetabular cavity out over the proximal margin of the remaining femur and into the expected vicinity of the trochanteric bursa. This collection is complex neck could well be infected. There bands  of low T1 signal which may be postoperative or could be from draining sinus tract both posteriorly extending through the gluteus maximus muscle (for example on images 18-19 series 6) and anterolaterally (images 14 through 28 of series 6. Correlate with any drainage along the skin surface anterolaterally and posterolaterally along the hip. There is chronic flattening and scalloping of the acetabulum but no definite abnormal edema signal in the acetabulum. There is abnormal edema tracking in the right gluteal musculature as well as the right upper vastus musculature. No sciatic nerve impingement on either side. 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 Presacral edema noted. There is abnormal fluid signal in the left operator internus muscle for example on image 12/4. This is contralateral to the side of the implant infection. Edema from this vicinity tracks along the sciatic notch and there is a small amount of edema in the distal gluteus minimus on the left, image 12/4. The significance of these left-sided muscular findings is uncertain but there is some associated inflammatory stranding along the left pelvic sidewall. There is also edema in the right lumbar paraspinal musculature. Reactive lymph nodes in the right hemipelvis. IMPRESSION: 1. Abnormal osseous marrow edema in the proximal 9.5 cm of the right femur probably from osteomyelitis. Overlying fluid collection extending from the acetabular fossa out of both  the femur and into the region of the trochanteric bursa, complex, questionably draining anterolaterally or posterolaterally, or both. Regional muscular edema and inflammatory findings. 2. Fluid signal intensity in the left operator internus muscle, tracking through the sciatic notch and also mildly involving the left gluteus minimus muscle, significance uncertain, but infection is not totally excluded given the surrounding inflammatory stranding along the left hemipelvis. 3.  There is also abnormal edema signal in the right paraspinal musculature along the lumbar spine. 4. 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 5. Please note that the patient terminated the exam after 4 sequences were obtained. We were not able to obtain any sagittal images and we were not able to obtain any post-contrast images because of this. 6. Reactive lymph nodes in the pelvis. Electronically Signed   By: Van Clines M.D.   On: 07/01/2016 15:34   Dg Fluoro Guided Needle Plc Aspiration/injection Loc  Result Date: 07/01/2016 CLINICAL DATA:  Prosthetic hip infection initial encounter EXAM: Right HIP ASPIRATION UNDER FLUOROSCOPY FLUOROSCOPY TIME:  Fluoroscopy Time: Radiation Exposure Index (if provided by the fluoroscopic device): 0 minutes 36 seconds Number of Acquired Spot Images: 0 PROCEDURE: Overlying skin prepped with Betadine, draped in the usual sterile fashion, and infiltrated locally with buffered Lidocaine. 20 gauge spinal needle used. Right hip replacement has been removed. Chronic periosteal reaction around the proximal femur suggesting chronic osteomyelitis. Soft tissue calcification lateral to the greater trochanter. MRI was reviewed. Small fluid collection around the proximal femur was targeted. Needle placement medial and superior to the proximal femur as well as lateral to the greater trochanter was unsuccessful in obtaining fluid. Ultrasound was then used. Small fluid collection was seen lateral to the greater trochanter. This was targeted but no fluid was obtained. The patient had pain during the study and was uncomfortable lying on her back. IMPRESSION: Unsuccessful right hip aspiration. Small fluid collection around the proximal femur did not yield fluid aspiration. There is considerable soft tissue edema around the proximal femur. Electronically Signed   By: Franchot Gallo M.D.   On: 07/01/2016 15:09   Dg Hip Unilat With Pelvis 2-3 Views Right  Addendum  Date: 07/01/2016   ADDENDUM REPORT: 07/01/2016 07:01 ADDENDUM: Appearance is essentially stable compared to an earlier study November 19, 2015. Electronically Signed   By: Lowella Grip III M.D.   On: 07/01/2016 07:01   Result Date: 07/01/2016 CLINICAL DATA:  50 year old multiple prior surgeries involving the right hip with previous infection who fell approximately 5 days ago onto the right hip with progressively worsening lateral right hip pain since that time. EXAM: DG HIP (WITH OR WITHOUT PELVIS) 2-3V RIGHT COMPARISON:  None. FINDINGS: Prior surgical excision of the right femoral head. Modeling of the right acetabulum. Large joint effusion suspected. Dystrophic calcifications surrounding the hip joint and possible intra-articular calcified loose bodies. No evidence of acute fracture. Included AP pelvis demonstrates a normal appearing contralateral left hip. Sacroiliac joints and symphysis pubis intact. IMPRESSION: 1. No acute osseous abnormality. 2. Prior surgical excision of the right femoral head. 3. Large right hip joint effusion is suspected. Electronically Signed: By: Evangeline Dakin M.D. On: 06/30/2016 15:50   Dg Hip Unilat With Pelvis 2-3 Views Right  Result Date: 06/30/2016 CLINICAL DATA:  50 year old multiple prior surgeries involving the right hip with previous infection who fell approximately 5 days ago onto the right hip with progressively worsening lateral right hip pain since that time. EXAM: DG HIP (WITH OR WITHOUT PELVIS) 2-3V RIGHT COMPARISON:  None. FINDINGS: Prior surgical excision of the right femoral head. Modeling of the right acetabulum. Large joint effusion suspected. Dystrophic calcifications surrounding the hip joint and possible intra-articular calcified loose bodies. No evidence of acute fracture. Included AP pelvis demonstrates a normal appearing contralateral left hip. Sacroiliac joints and symphysis pubis intact. IMPRESSION: 1. No acute osseous abnormality. 2. Prior surgical  excision of the right femoral head. 3. Large right hip joint effusion is suspected. Electronically Signed   By: Evangeline Dakin M.D.   On: 06/30/2016 15:50   Medications: I have reviewed the patient's current medications. Scheduled Meds: . diclofenac sodium  4 g Topical TID AC & HS  . feeding supplement (ENSURE ENLIVE)  237 mL Oral BID BM  . ferrous sulfate  325 mg Oral Daily  . fluticasone furoate-vilanterol  1 puff Inhalation Daily  . gabapentin  300 mg Oral QID  . heparin  5,000 Units Subcutaneous Q8H  .  HYDROmorphone (DILAUDID) injection  1 mg Intravenous Once  . hydroxychloroquine  200 mg Oral BID  . montelukast  10 mg Oral Daily  . nystatin   Topical BID  . senna-docusate  2 tablet Oral BID  . 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 Linus Salmons (Infectious Disease) who recommended waiting to initiate IV antibiotics until orthopedic decision to proceed with surgery, as debridement could yield cultures and sensitivities to guide treatment. Appreciate consultation. 03/14/16 visit found MRSA as osteo cause. --Continue with doxycycline until orthopedic decision regarding surgery or not  Pain: Continue with IV dilaudid 4mg  q4hrs for breakthrough along with baseline regimen of PO oxycodone 20mg  q6; gabapentin 800 TID; flexeril 5mg  BID prn  New murmur:  --Echo today  Vaginal Candidiasis:  --Fluconazole 150 mg  Once taken last evening --Continue with Nystatin powder nightly    Continue with IV fluids to keep vein open, but reduce flow rate to lowest possible.  This is a Careers information officer Note.  The care of the patient was discussed with Dr. Quay Burow and the assessment and plan formulated with their assistance.  Please see their attached note for official documentation of the daily encounter.   LOS: 2 days   Glendale Chard, Medical Student 07/02/2016, 10:37 AM

## 2016-07-02 NOTE — Discharge Summary (Signed)
Name: Katherine Banks MRN: [id number] DOB: January 15, 1966 50 y.o. PCP: Katherine Estelle, MD  Date of Admission: 06/30/2016  4:54 PM Date of Discharge: 07/11/2016 Attending Physician: Dr. 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   traMADol 50 MG tablet Commonly known as:  ULTRAM   zolpidem 10 MG tablet Commonly known as:  AMBIEN     TAKE these medications   albuterol (2.5 MG/3ML) 0.083% nebulizer solution Commonly known as:  PROVENTIL Take 3 mLs (2.5 mg total) by nebulization every 8 (eight) hours. What changed:  when to take this  reasons to take this   colesevelam 625 MG tablet Commonly known as:  WELCHOL Take 1,250 mg by mouth daily as needed. Reported on 03/14/2016   cyanocobalamin 500 MCG tablet Take 1 tablet (500 mcg total) by mouth daily.   diclofenac sodium 1 % Gel Commonly known as:  VOLTAREN Apply 4 g topically 4 (four) times daily -  before meals and at bedtime.   dicyclomine 20 MG tablet Commonly known as:  BENTYL Take 1 tablet (20 mg total) by mouth 2 (two) times daily as needed (45 MUST LAST 30 DAYS).   docusate sodium 100 MG capsule Commonly known as:  COLACE Take 1 capsule (100 mg total) by mouth daily.   doxycycline 100 MG tablet Commonly known as:  VIBRA-TABS Take 1 tablet  (100 mg total) by mouth 2 (two) times daily.   ferrous sulfate 325 (65 FE) MG tablet Take 1 tablet (325 mg total) by mouth daily.   fluticasone 50 MCG/ACT nasal spray Commonly known as:  FLONASE Place 1 spray into both nostrils daily. What changed:  how much to take  when to take this  reasons to take this   fluticasone furoate-vilanterol 200-25 MCG/INH Aepb Commonly known as:  BREO ELLIPTA Inhale 1 puff into the lungs daily.   furosemide 20 MG tablet Commonly known as:  LASIX TAKE 1 TABLET (20 MG TOTAL) BY MOUTH DAILY. What changed:  when to take this  reasons to take this   gabapentin 400 MG capsule Commonly known as:  NEURONTIN TAKE 2 CAPSULES BY MOUTH 3 TIMES A DAY What changed:  Another medication with the same name was removed. Continue taking this medication, and follow the directions you see here.   hydroxychloroquine 200 MG tablet Commonly known as:  PLAQUENIL Take 1 tablet (200 mg total) by mouth 2 (two) times daily.   lidocaine 5 % Commonly known as:  LIDODERM Place 1 patch onto the skin daily. Remove & Discard patch within 12 hours or as directed by MD   montelukast 10 MG tablet Commonly known as:  SINGULAIR Take 10 mg by mouth daily. Reported on 03/14/2016   naproxen 500 MG tablet Commonly known as:  NAPROSYN Take 1 tablet (500 mg  total) by mouth 2 (two) times daily with a meal.   nystatin ointment Commonly known as:  MYCOSTATIN Apply topically 2 (two) times daily.   omeprazole 20 MG capsule Commonly known as:  PRILOSEC Take 1 capsule (20 mg total) by mouth 2 (two) times daily before a meal. What changed:  when to take this  reasons to take this   ondansetron 8 MG disintegrating tablet Commonly known as:  ZOFRAN-ODT Take 1 tablet (8 mg total) by mouth every 8 (eight) hours as needed for nausea or vomiting.   oxyCODONE 30 MG 12 hr tablet Take 30 mg by mouth every 12 (twelve) hours. What changed:  You were already taking a medication with  the same name, and this prescription was added. Make sure you understand how and when to take each. Replaces:  Oxycodone HCl 20 MG Tabs   oxyCODONE 15 MG immediate release tablet Commonly known as:  ROXICODONE Take 1 tablet (15 mg total) by mouth every 4 (four) hours as needed for severe pain. What changed:  reasons to take this  Another medication with the same name was removed. Continue taking this medication, and follow the directions you see here.   predniSONE 5 MG tablet Commonly known as:  DELTASONE Take 5 mg by mouth 4 (four) times daily.   rifampin 300 MG capsule Commonly known as:  RIFADIN Take 1 capsule (300 mg total) by mouth every 12 (twelve) hours.       Disposition and follow-up:   [name] was discharged from [facility] in Good condition.  At the hospital follow up visit please address:  Acute on Chronic R Hip Osteomyelitis: Discharged on rifampin and doxycycline. Patient will follow up with ID on 10/10, [facility] on 9/15 and ortho on 9/25.  Chronic Microcytic Anemia: Hemoglobin remained stable in the 8-9 range. Ferritin was normal.  Thrombocytosis: Platelets elevated to the 600s. Patient has a history of thrombocytosis in the 400-600 range. Likely secondary to osteomyelitis.  2.  Labs / imaging needed at time of follow-up: CBC  3.  Pending labs/ test needing follow-up: None  Follow-up Appointments: Follow-up Information    Katherine Rossetti, MD. Go on 07/25/2016.   Specialty:  Orthopedic Surgery Why:  at 9:30am Contact information: [address] [place] [address] [place]. Go on 07/15/2016.   Why:  Appointment is at 10:45 AM Contact information: 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 MRI showed an abnormal osseous marrow edema in the proximal 9.5 cm of the right femur probably from osteomyelitis with overlying fluid collection. Patient underwent right hip aspiration which showed Group B Strep (Strep Agalactiae) sensitive to ampicillin, ceftriaxone, levofloxacin, and vancomycin, but resistant to clindamycin and erythromycin. Prior to culture results, ID recommended rifampin and doxycycline; however, given culture results she was changed to rifampin and keflex. They planned to follow up with her as outpatient. Orthopedic Surgery also plans to follow up  with her as outpatient to discuss future surgical options if antibiotic therapy fails.   UTI: UA was not concerning for infection, but UCx grew E. Coli sensitive to nitrofurantoin and vancomycin. Patient was provided with an rx for Macrobid 100 mg BID for 3 days.  Chronic Microcytic Anemia: Hemoglobin remained stable in the 8-9 range. Ferritin was normal.  Thrombocytosis: Platelets elevated to the 600s. Patient has a history of thrombocytosis in the 400-600 range. Likely secondary to osteomyelitis.  Hypertension: Normotensive. Patient has a history of hypertension on medical management with clonidine 0.2 mg bid and amlodipine 10 mg daily. These medications were continued.  Lupus: Patient has history of lupus on plaquenil 200mg  BID and prednisone 10 mg daily. She states she is has established with a rheumatologist Leafy Kindle, [place]) at [facility]. Patient was continued on her home medication regimen.    Discharge Vitals:   BP (!) 145/73 (BP Location: Left Arm)   Pulse 88   Temp 98.2 F (36.8 C)  (Oral)   Resp 16   Ht 5\' 6"  (1.676 m)   Wt 237 lb (107.5 kg)   SpO2 98%   BMI 38.25 kg/m    Discharge Instructions: Discharge Instructions    Diet - low sodium heart healthy    Complete by:  As directed   Increase activity slowly    Complete by:  As directed      Signed: Martyn Malay, DO PGY-3 Internal Medicine Resident Pager # [phone] 07/11/2016 3:22 PM

## 2016-07-02 NOTE — Evaluation (Signed)
Occupational Therapy Evaluation Patient Details Name: Katherine Banks MRN: [id number] DOB: [birth year] Today's Date: 07/02/2016    History of Present Illness Pt is a 50 y.o. 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 Pt now admitted with chronic osteomyelitis in the residual bone in the remaining proximal femur. Possibility of hind-quarter amputation being considered. PMH: seizures, PTSD, lupus, hypertension, DVT, asthma, anxiety.    Clinical Impression   PTA, pt required min assist for bathing and dressing tasks from her mother and primarily use RW for short distance mobility in the home and w/c for community mobility. Pt currently requires min guard assist for basic transfers and min assist for seated LB ADLs. Unsure of surgical plan while here in hospital and will need to re-assess if pt has any procedures done while here this admission. Will continue to follow acutely to maximize pt's independence and safety with ADLs and mobility to allow for safe discharge.    Follow Up Recommendations  No OT follow up;Supervision/Assistance - 24 hour - may need to be re-assessed if pt has any procedure/surgery done this admission   Equipment Recommendations  None recommended by OT    Recommendations for Other Services       Precautions / Restrictions Precautions Precautions: Fall Restrictions Weight Bearing Restrictions: No      Mobility Bed Mobility Overal bed mobility: Needs Assistance Bed Mobility: Supine to Sit     Supine to sit: Supervision;HOB elevated     General bed mobility comments: No physical assist or cues required.  Transfers Overall transfer level: Needs assistance Equipment used: Rolling walker (2 wheeled) Transfers: Sit to/from Stand Sit to Stand: Min guard         General transfer comment: Min guard assist for safety.    Balance Overall balance assessment:  Needs assistance Sitting-balance support: No upper extremity supported;Feet supported Sitting balance-Leahy Scale: Good     Standing balance support: Bilateral upper extremity supported;During functional activity Standing balance-Leahy Scale: Poor Standing balance comment: using rw                            ADL Overall ADL's : Needs assistance/impaired Eating/Feeding: Set up;Sitting   Grooming: Wash/dry hands;Wash/dry face;Min guard;Standing   Upper Body Bathing: Set up;Sitting   Lower Body Bathing: Minimal assistance;Sit to/from stand   Upper Body Dressing : Set up;Sitting   Lower Body Dressing: Min guard;Sit to/from stand   Toilet Transfer: Min guard;Ambulation;BSC;RW   Toileting- Water quality scientist and Hygiene: Min guard;Sit to/from stand       Functional mobility during ADLs: Min guard;Rolling walker       Vision Vision Assessment?: No apparent visual deficits   Perception     Praxis      Pertinent Vitals/Pain Pain Assessment: Faces Faces Pain Scale: Hurts little more Pain Location: R hip and back Pain Descriptors / Indicators: Grimacing;Guarding Pain Intervention(s): Limited activity within patient's tolerance;Monitored during session;Repositioned     Hand Dominance Right   Extremity/Trunk Assessment Upper Extremity Assessment Upper Extremity Assessment: Overall WFL for tasks assessed   Lower Extremity Assessment Lower Extremity Assessment: Defer to PT evaluation RLE Deficits / Details: Able to advance with compensatory back extension during ambulation. Overall gerneralized weakness throughout. Pt maintaining LE in externally rotated position.    Cervical / Trunk Assessment Cervical / Trunk Assessment: Other exceptions Cervical / Trunk Exceptions: rounded shoulders, rounded head likely  due to body habitus in addition to multiple R hip surgeries   Communication Communication Communication: No difficulties   Cognition  Arousal/Alertness: Awake/alert Behavior During Therapy: WFL for tasks assessed/performed Overall Cognitive Status: Within Functional Limits for tasks assessed                     General Comments       Exercises       Shoulder Instructions      Home Living Family/patient expects to be discharged to:: Private residence Living Arrangements: Parent Available Help at Discharge: Family;Available 24 hours/day Type of Home: House Home Access: Stairs to enter [employer] of Steps: 4 Entrance Stairs-Rails: Right;Left Home Layout: One level     Bathroom Shower/Tub: Tub/shower unit Shower/tub characteristics: Curtain Biochemist, clinical: Standard     Home Equipment: Environmental consultant - 4 wheels;Wheelchair - Biomedical scientist Comments: Lives with mother.       Prior Functioning/Environment Level of Independence: Needs assistance  Gait / Transfers Assistance Needed: rollator for short ambulation around the house, w/c for community mobility.  ADL's / Homemaking Assistance Needed: assistance with dressing/bathing sometimes        OT Diagnosis: Acute pain   OT Problem List: Decreased strength;Decreased range of motion;Decreased activity tolerance;Impaired balance (sitting and/or standing);Decreased safety awareness;Decreased knowledge of use of DME or AE;Pain;Obesity   OT Treatment/Interventions: Self-care/ADL training;Therapeutic exercise;DME and/or AE instruction;Therapeutic activities;Patient/family education;Balance training    OT Goals(Current goals can be found in the care plan section) Acute Rehab OT Goals Patient Stated Goal: eventually live by herself OT Goal Formulation: With patient Time For Goal Achievement: 07/16/16 Potential to Achieve Goals: Good ADL Goals Pt Will Perform Lower Body Bathing: with modified independence;with adaptive equipment;sit to/from stand Pt Will Perform Lower Body Dressing: with modified independence;with adaptive  equipment;sit to/from stand Pt Will Transfer to Toilet: with modified independence;ambulating;bedside commode (over toilet) Pt Will Perform Toileting - Clothing Manipulation and hygiene: with modified independence;sit to/from stand Pt Will Perform Tub/Shower Transfer: Tub transfer;with supervision;ambulating;shower seat;rolling walker  OT Frequency: Min 2X/week   Barriers to D/C:            Co-evaluation PT/OT/SLP Co-Evaluation/Treatment: Yes Reason for Co-Treatment: For patient/therapist safety   OT goals addressed during session: ADL's and self-care;Proper use of Adaptive equipment and DME      End of Session Equipment Utilized During Treatment: Gait belt;Rolling walker Nurse Communication: Mobility status  Activity Tolerance: Patient tolerated treatment well Patient left: in chair;with call bell/phone within reach   Time: [id number] OT Time Calculation (min): 24 min Charges:  OT General Charges $OT Visit: 1 Procedure OT Evaluation $OT Eval Moderate Complexity: 1 Procedure G-Codes:    Redmond Baseman, OTR/L [phone] 07/02/2016, 11:58 AM

## 2016-07-03 ENCOUNTER — Inpatient Hospital Stay (HOSPITAL_COMMUNITY): Payer: Medicare Other

## 2016-07-03 ENCOUNTER — Other Ambulatory Visit (HOSPITAL_COMMUNITY): Payer: Medicare Other

## 2016-07-03 DIAGNOSIS — R011 Cardiac murmur, unspecified: Secondary | ICD-10-CM

## 2016-07-03 LAB — ECHOCARDIOGRAM COMPLETE
Height: 66 in
Weight: 3792 oz

## 2016-07-03 LAB — CBC
HEMATOCRIT: 26.9 % — AB (ref 36.0–46.0)
HEMOGLOBIN: 8.1 g/dL — AB (ref 12.0–15.0)
MCH: 23.2 pg — ABNORMAL LOW (ref 26.0–34.0)
MCHC: 30.1 g/dL (ref 30.0–36.0)
MCV: 77.1 fL — AB (ref 78.0–100.0)
Platelets: 656 10*3/uL — ABNORMAL HIGH (ref 150–400)
RBC: 3.49 MIL/uL — ABNORMAL LOW (ref 3.87–5.11)
RDW: 18.1 % — AB (ref 11.5–15.5)
WBC: 11 10*3/uL — AB (ref 4.0–10.5)

## 2016-07-03 MED ORDER — HYDROMORPHONE HCL 1 MG/ML IJ SOLN
1.0000 mg | INTRAMUSCULAR | Status: DC | PRN
  Administered 2016-07-03 – 2016-07-04 (×5): 1 mg via INTRAVENOUS
  Filled 2016-07-03 (×5): qty 1

## 2016-07-03 NOTE — Progress Notes (Signed)
   Subjective: Patient was seen and examined this morning. She stood up this morning to wash her hands and had increased pain in her right hip when she put weight on her hip. She denied falling. She denies fever or chills.   Objective: Vital signs in last 24 hours: Vitals:   07/02/16 2042 07/03/16 0451 07/03/16 0931 07/03/16 1253  BP: (!) 112/52 133/70  136/66  Pulse: 76 95  88  Resp: 16 16  15   Temp: 98.2 F (36.8 C) 98.1 F (36.7 C)  97.9 F (36.6 C)  TempSrc: Oral Oral    SpO2: 100% 100% 99% 97%  Weight:      Height:       Physical Exam General: Vital signs reviewed. Patient isobese,in no acute distress and cooperative with exam.  Cardiovascular: RRR, 2/6 early systolic murmur. Pulmonary/Chest:Clear to auscultation bilaterally, no wheezes, rales, or rhonchi. Extremities: Right hip tender to palpation. No lower extremity edema bilaterally, pulses symmetric and intact bilaterally.   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 MRI showed an abnormal osseous marrow edema in the proximal 9.5 cm of the right femur probably from osteomyelitis with overlying fluid collection. For now, as patient is afebrile and without systemic signs of infection, we will continue to hold antibiotics. This was discussed with ID. -Holding antibiotics -Consider treating empirically versus surgery -Discussed with Ortho, Dr. Ninfa Linden and ID, Dr. Linus Salmons, appreciate recommendations -Continue homeoxycodone 20 mg q6hr; gabapentin 800 TID; Flexeril 5 mg BID PRN -Diluadid 1 mg Q4H prn breakthrough -Voltaren gel  New  Systolic Murmur: 2/6 early systolic ejection murmur. Will evaluate with echo. -Echo  Chronic Microcytic Anemia: Hemoglobin 8.1 this morning. Ferritin was normal. -Monitor intermittently  Thrombocytosis: Platelets 656 this morning. Patient has a history of thrombocytosis in the 400-600 range. Unclear cause as patient is not iron deficient or having acute blood loss.  -Repeat CBC tomorrow am  Vaginal Candidiasis with Intertrigo: -Nystatin ointment BID  Hypertension: Normotensive. Patient has a history of hypertension on medical management with clonidine 0.2 mg bid and amlodipine 10 mg daily. -Continue amlodipine 10 mg daily and clonidine 0.2 mg BID  Lupus: Patient has history of lupus on plaquenil 200mg  BID and prednisone 10 mg daily. She states she is has established with a rheumatologist Leafy Kindle, [place]) at [facility]. 05/04/2016 no lupus anticoagulant detected. -Continue home medications.  Dispo: Anticipated discharge in approximately 3 day(s).   LOS: 3 days   Martyn Malay, DO PGY-3 Internal Medicine Resident Pager # [phone] 07/03/2016 12:57 PM

## 2016-07-03 NOTE — Progress Notes (Signed)
PT Cancellation Note  Patient Details Name: Katherine Banks MRN: [id number] DOB: [birth year]   Cancelled Treatment:    Reason Eval/Treat Not Completed: Other (comment) (patient reports losing her balance this am and NT "caught her" and her leg is hurting and requesting to defer today)   Shanna Cisco 07/03/2016, 12:20 PM

## 2016-07-03 NOTE — Progress Notes (Addendum)
Patient stated she felt weak this morning while in the restroom. Patient states, "I felt my legs give out." Patient was appreciative of Nurse Tech. Patient denies falling. Patient is resting in bed but complains of right lateral pain. RN contact MD due to patient pain medication concerns.  MD made medication adjustment.

## 2016-07-03 NOTE — Progress Notes (Signed)
  Echocardiogram 2D Echocardiogram has been performed.  Jennette Dubin 07/03/2016, 2:30 PM

## 2016-07-04 MED ORDER — GABAPENTIN 400 MG PO CAPS
800.0000 mg | ORAL_CAPSULE | Freq: Three times a day (TID) | ORAL | Status: DC
  Administered 2016-07-04 – 2016-07-08 (×13): 800 mg via ORAL
  Filled 2016-07-04 (×13): qty 2

## 2016-07-04 MED ORDER — PREDNISONE 5 MG PO TABS
5.0000 mg | ORAL_TABLET | Freq: Every day | ORAL | Status: DC
  Administered 2016-07-04 – 2016-07-08 (×5): 5 mg via ORAL
  Filled 2016-07-04 (×5): qty 1

## 2016-07-04 MED ORDER — LIDOCAINE 5 % EX PTCH
1.0000 | MEDICATED_PATCH | CUTANEOUS | Status: DC
  Administered 2016-07-04 – 2016-07-08 (×6): 1 via TRANSDERMAL
  Filled 2016-07-04 (×7): qty 1

## 2016-07-04 MED ORDER — HYDROMORPHONE HCL 1 MG/ML IJ SOLN: 0.5000 mg | Freq: Two times a day (BID) | INTRAMUSCULAR | Status: DC | PRN

## 2016-07-04 MED ORDER — TRAMADOL HCL 50 MG PO TABS
100.0000 mg | ORAL_TABLET | Freq: Two times a day (BID) | ORAL | Status: DC | PRN
  Administered 2016-07-04: 100 mg via ORAL
  Filled 2016-07-04 (×2): qty 2

## 2016-07-04 MED ORDER — NAPROXEN 250 MG PO TABS
500.0000 mg | ORAL_TABLET | Freq: Two times a day (BID) | ORAL | Status: DC
  Administered 2016-07-04 – 2016-07-08 (×9): 500 mg via ORAL
  Filled 2016-07-04 (×9): qty 2

## 2016-07-04 MED ORDER — HYDROMORPHONE HCL 1 MG/ML IJ SOLN
0.5000 mg | INTRAMUSCULAR | Status: DC | PRN
  Administered 2016-07-04: 0.5 mg via INTRAVENOUS
  Filled 2016-07-04: qty 1

## 2016-07-04 MED ORDER — DOXYCYCLINE HYCLATE 100 MG PO TABS
100.0000 mg | ORAL_TABLET | Freq: Two times a day (BID) | ORAL | Status: DC
  Administered 2016-07-04 – 2016-07-05 (×3): 100 mg via ORAL
  Filled 2016-07-04 (×3): qty 1

## 2016-07-04 NOTE — Progress Notes (Signed)
PT Cancellation Note  Patient Details Name: Katherine Banks MRN: [id number] DOB: 1966-06-17   Cancelled Treatment:    Reason Eval/Treat Not Completed: Pt requesting to wait until after she gets her pain medication. Will check back as able.    Cassell Clement, PT, CSCS Pager [phone] Office [phone]  07/04/2016, 1:09 PM

## 2016-07-04 NOTE — Progress Notes (Signed)
Physical Therapy Treatment Patient Details Name: Katherine Banks MRN: [id number] DOB: [birth year] Today's Date: 07/04/2016    History of Present Illness Pt is a 50 y.o. 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 Pt now admitted with chronic osteomyelitis in the residual bone in the remaining proximal femur. Possibility of hind-quarter amputation being considered. PMH: seizures, PTSD, lupus, hypertension, DVT, asthma, anxiety.     PT Comments    Pt with decreased mobility and activity tolerance during PT session. PT reports having increased Rt hip pain since "jarring" it the other day. Pt continues to plan to return home but mobility has decreased since evaluation with reports of increased pin. PT to continue to follow and modify recommendations as needed depending on mobility progression. Pt will have to ambulate up stairs to enter her home.   Follow Up Recommendations  No PT follow up;Supervision for mobility/OOB     Equipment Recommendations  None recommended by PT    Recommendations for Other Services       Precautions / Restrictions Precautions Precautions: Fall Restrictions Weight Bearing Restrictions: No    Mobility  Bed Mobility Overal bed mobility: Needs Assistance Bed Mobility: Supine to Sit;Sit to Supine     Supine to sit: Min assist (with Rt LE) Sit to supine: Mod assist (with Rt LE)   General bed mobility comments: Pt needing increased assistance with Rt LE during bed mobility.  Transfers Overall transfer level: Needs assistance Equipment used: Rolling walker (2 wheeled) Transfers: Sit to/from Stand Sit to Stand: Min guard         General transfer comment: Min guard assist for safety.  Ambulation/Gait Ambulation/Gait assistance: Min guard Ambulation Distance (Feet): 30 Feet Assistive device: Rolling walker (2 wheeled) Gait Pattern/deviations:  (swing-to  pattern) Gait velocity: decreased   General Gait Details: Pt dragging Rt LE with reports of increased pain. Pt taking 3 standing breaks during ambulation.    Stairs            Wheelchair Mobility    Modified Rankin (Stroke Patients Only)       Balance Overall balance assessment: Needs assistance Sitting-balance support: No upper extremity supported Sitting balance-Leahy Scale: Good     Standing balance support: Bilateral upper extremity supported Standing balance-Leahy Scale: Poor Standing balance comment: using rw                    Cognition Arousal/Alertness: Awake/alert Behavior During Therapy: WFL for tasks assessed/performed Overall Cognitive Status: Within Functional Limits for tasks assessed                      Exercises      General Comments        Pertinent Vitals/Pain Pain Assessment: Faces Faces Pain Scale: Hurts even more Pain Location: Rt thigh/groin Pain Descriptors / Indicators: Aching;Sharp Pain Intervention(s): Limited activity within patient's tolerance;Monitored during session    Home Living                      Prior Function            PT Goals (current goals can now be found in the care plan section) Acute Rehab PT Goals Patient Stated Goal: less pain PT Goal Formulation: With patient Time For Goal Achievement: 07/16/16 Potential to Achieve Goals: Good Progress towards PT goals: Not progressing toward goals - comment (decreased mobility during  session due to pain)    Frequency  Min 3X/week    PT Plan Current plan remains appropriate    Co-evaluation             End of Session Equipment Utilized During Treatment: Gait belt Activity Tolerance: Patient limited by pain Patient left: in bed;with call bell/phone within reach (declined sitting in chair)     Time: 1400-1415 PT Time Calculation (min) (ACUTE ONLY): 15 min  Charges:  $Gait Training: 8-22 mins                    G Codes:       Cassell Clement, PT, CSCS Pager [phone] Office [phone]  07/04/2016, 4:26 PM

## 2016-07-04 NOTE — Progress Notes (Signed)
Patient ID: Katherine Banks, female   DOB: 30-May-1966, 50 y.o.   MRN: [id number] There is not really a good option for treating chronically infected bone other than removing the bone itself.  I spoke to her in detail about this.  Removing such a large amount of proximal femur would also not allow for support of the rest of her leg.  The only surgical option I think at this point would be a hind-quarter amputation.  Somehow she has been able to occasionally walk on her right lower extremity in spite of having no hip.  She does not like the idea of loosing her entire right leg. She would like to think about this for a while.  This is not surgery that I fell comfortable performing and she would likely need referral and acceptance by a tertiary care center.  Obviously, this is not life threatening at the moment.  My partner, Dr. Sharol Given, does have some experience with these, but he will be out of town thru mid-week next week.

## 2016-07-04 NOTE — Care Management Important Message (Signed)
Important Message  Patient Details  Name: Katherine Banks MRN: [id number] Date of Birth: July 06, 1966   Medicare Important Message Given:  Yes    Orbie Pyo 07/04/2016, 9:28 AM

## 2016-07-04 NOTE — Progress Notes (Signed)
Subjective: Patient was evaluated this morning.  She denies fevers or chills.  Patient continues to endorse right hip pain.  She denies attempting to stand up since yesterday and states she has been using the bedside commode.  Objective:  Vital signs in last 24 hours: Vitals:   07/03/16 0931 07/03/16 1253 07/03/16 2308 07/04/16 0622  BP:  136/66 (!) 141/69 134/68  Pulse:  88 95 92  Resp:  15 16 16   Temp:  97.9 F (36.6 C) 99.1 F (37.3 C) 98.9 F (37.2 C)  TempSrc:   Oral Oral  SpO2: 99% 97% 97% 96%  Weight:      Height:       Physical Exam  Constitutional: No distress.  obese  Cardiovascular:  Regular, rate and rhythm 2/6 systolic murmur noted   Pulmonary/Chest: Effort normal.  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  Dr. Ninfa Linden spoke with the patient about definitive treatment of her chronic osteomyelitis which would include a hind-quarter amputation. This would mean removal of the entire right leg. Patient stated she needed time to think about having the surgery. This is not an urgent surgery and will likely need referral and acceptance by a tertiary care center.  Discussed with ID about empirical options and was recommended to continue Doxycycline. - Doxycycline 100mg  BID -oxycodone 30 mg q6hr; gabapentin 800 TID; Flexeril 5 mg BID PRN - Tramadol 100 mg BID - decrease  Diluadid 0.5 mg Q4H prn  - Voltaren gel - Prednisone 5mg  daily  Back pain:  Patient is having new right sided low back pain and is likely multifactorial.  Her chronic osteomyelitis and several recent falls are likely contributing.  She is also found to have mild-to-moderate degenerative disc disease and spondylosis at L3-4. - Lidocaine gel  - Naproxen 500mg  BID  New Systolic Murmur: Echo showed normal systolic function ejection fraction was in the range of 60-65% there is grade 1 diastolic dysfunction the left atrium was mildly elevated. There is no aortic regurg or stenosis. There was trivial mitral regurg with no mitral stenosis.  Murmur is not likely from valvular disease. - Monitor for any changes  Chronic Microcytic Anemia: Hemoglobin 8.1 on 9/3. Ferritin was normal. - CBC tomorrow morning  Thrombocytosis: Platelets 656 on 9/3. Patient has a history of thrombocytosis in the 400-600 range. Unclear cause as patient is not iron deficient or having acute blood loss.  -Repeat CBC tomorrow morning  Vaginal Candidiasis with Intertrigo: -Nystatin ointment BID  Hypertension: BP stable at 134/68. Home medications include clonidine 0.2 mg bid and amlodipine 10 mg daily.  Patient was hypotensive during admission in 80s/40s and blood pressure medication was held. - Patient is normotensive will continue holding home blood pressure medications and monitoring.  Lupus: Patient has history of lupus on plaquenil 200mg  BID and prednisone 10 mg daily.  -Continue home medications.  Dispo: Anticipated discharge in approximately 2 day(s).   Valinda Party,  DO 07/04/2016, 9:39 AM Pager: [phone]

## 2016-07-05 DIAGNOSIS — M86651 Other chronic osteomyelitis, right thigh: Secondary | ICD-10-CM

## 2016-07-05 DIAGNOSIS — D473 Essential (hemorrhagic) thrombocythemia: Secondary | ICD-10-CM

## 2016-07-05 DIAGNOSIS — B9562 Methicillin resistant Staphylococcus aureus infection as the cause of diseases classified elsewhere: Secondary | ICD-10-CM

## 2016-07-05 DIAGNOSIS — Z7952 Long term (current) use of systemic steroids: Secondary | ICD-10-CM

## 2016-07-05 DIAGNOSIS — M329 Systemic lupus erythematosus, unspecified: Secondary | ICD-10-CM

## 2016-07-05 DIAGNOSIS — D509 Iron deficiency anemia, unspecified: Secondary | ICD-10-CM

## 2016-07-05 DIAGNOSIS — B9689 Other specified bacterial agents as the cause of diseases classified elsewhere: Secondary | ICD-10-CM

## 2016-07-05 LAB — CBC
HEMATOCRIT: 30 % — AB (ref 36.0–46.0)
HEMOGLOBIN: 9 g/dL — AB (ref 12.0–15.0)
MCH: 23.3 pg — ABNORMAL LOW (ref 26.0–34.0)
MCHC: 30 g/dL (ref 30.0–36.0)
MCV: 77.5 fL — ABNORMAL LOW (ref 78.0–100.0)
Platelets: 693 10*3/uL — ABNORMAL HIGH (ref 150–400)
RBC: 3.87 MIL/uL (ref 3.87–5.11)
RDW: 17.9 % — AB (ref 11.5–15.5)
WBC: 12.5 10*3/uL — AB (ref 4.0–10.5)

## 2016-07-05 MED ORDER — FERROUS SULFATE 325 (65 FE) MG PO TABS
325.0000 mg | ORAL_TABLET | ORAL | Status: DC
  Administered 2016-07-06 – 2016-07-08 (×3): 325 mg via ORAL
  Filled 2016-07-05 (×3): qty 1

## 2016-07-05 MED ORDER — HYDROMORPHONE HCL 1 MG/ML IJ SOLN
1.0000 mg | Freq: Once | INTRAMUSCULAR | Status: AC
  Administered 2016-07-05: 1 mg via INTRAVENOUS
  Filled 2016-07-05: qty 1

## 2016-07-05 MED ORDER — DOXYCYCLINE HYCLATE 100 MG PO TABS: 100.0000 mg | ORAL_TABLET | Freq: Two times a day (BID) | ORAL | Status: DC

## 2016-07-05 MED ORDER — OXYCODONE HCL 5 MG PO TABS
15.0000 mg | ORAL_TABLET | ORAL | Status: DC | PRN
  Administered 2016-07-05 – 2016-07-08 (×13): 15 mg via ORAL
  Filled 2016-07-05 (×13): qty 3

## 2016-07-05 MED ORDER — OXYCODONE HCL ER 15 MG PO T12A
30.0000 mg | EXTENDED_RELEASE_TABLET | Freq: Two times a day (BID) | ORAL | Status: DC
  Administered 2016-07-05 – 2016-07-08 (×7): 30 mg via ORAL
  Filled 2016-07-05 (×7): qty 2

## 2016-07-05 MED ORDER — OXYCODONE HCL 5 MG PO TABS
15.0000 mg | ORAL_TABLET | Freq: Four times a day (QID) | ORAL | Status: DC | PRN
  Administered 2016-07-05: 15 mg via ORAL
  Filled 2016-07-05: qty 3

## 2016-07-05 NOTE — Progress Notes (Signed)
Chaplain presented to the patient, introduced self, and inquired of spiritual care needs that Chaplain could assist her with. The patient states she has multiple surgeries on her right leg with more possible to have.  She states her faith is what holds her up and keeps her strong. She has more decisions to make regarding her medical progress, and will make her decisions based on prayer as she has always done. She has family for support, and a faith community to support her as well.  Chaplain will continue to follow up for support. [place] [phone]
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
## 2016-07-05 NOTE — Progress Notes (Addendum)
Internal Medicine Attending:   I saw and examined the patient. I reviewed the resident's note and I agree with the resident's findings and plan as documented in the resident's note.  49 year old woman with a complicated history of right hip surgeries was admitted with increasing right hip pain, elevated inflammatory markers, and signs of extensive right proximal femur osteomyelitis on MRI imaging. Her last cultures were from a fluid collection of the right joint space which showed a pansensitive MRSA. She completed 6 weeks of vancomycin and then transitioned to doxycycline which she has been on for over 5 months now. With the worsening osteomyelitis I anticipate she must have a new resistance to doxycycline but probably this is the same MRSA. The only way to know for sure would be to do a bone biopsy. We are going to consult infectious disease to help [place] optimize an antibiotic option for the patient. She is talking with surgery about possible surgical interventions which will likely mean extensive amputation. We are also working on pain regimen, adding a long acting agent and carefully titrating the immediate release opiates.

## 2016-07-05 NOTE — Progress Notes (Signed)
   Subjective: Patient was evaluated this morning on rounds. Patient continues to have right sided hip pain that is not being controlled with current pain regimen. She states her pain as a 7 out of 10. She is concerned about being discharged if her pain is not manageable.  She has an appointment with the pain clinic on 9/13. She denies fever, chills or shortness of breath.  Objective:  Vital signs in last 24 hours: Vitals:   07/04/16 1329 07/04/16 2100 07/05/16 0557 07/05/16 1224  BP:  (!) 150/61 118/62 118/64  Pulse:  93 74 98  Resp:  16 16 16   Temp:  98.4 F (36.9 C) 97.8 F (36.6 C) 98 F (36.7 C)  TempSrc:  Oral Oral Oral  SpO2: 96% 99% 97% 94%  Weight:      Height:       Physical Exam  Cardiovascular: Regular rhythm.  Exam reveals no gallop and no friction rub.   2/6 systolic murmur  Pulmonary/Chest: Effort normal.  Clear to auscultation bilaterally with no wheezes, rhonchi or rales  Abdominal: Soft.  Musculoskeletal: She exhibits no edema.  Tenderness over the right hip  Skin: Skin is warm and dry.    Assessment/Plan:  Principal Problem:   Osteomyelitis ([place]) Active Problems:   Lupus (systemic lupus erythematosus) (HCC)   History of inferior vena caval filter placement   Chronic infection of right hip on antibiotics (H[facility])   HTN (hypertension)   History of pulmonary embolus (PE)  Chronic osteomyelitis of the right hip Patient continues to endorse right-sided hip pain. Her pain medication had been switched from IV to oral in hopes to be able to work towards discharge. Because she continues to endorse pain a new oral pain regimen may be needed. She is currently taking oxycodone 30 mg Q6 which is her home dose. This provides relief for 2 hours. She is also given tramadol 100 mg twice a day but states this doesn't help. ID was consulted and recommended a repeat biopsy or aspirate to determine appropriate antibiotics.  Other options if biopsy or aspirate is not obtained is  with the Bactrim and rifampin or Doxy and rifampin. She is on prednisone 5 mg daily and plaquenil for SLE making healing more difficult. - Stopped Doxycycline  - OxyContin 30 mg twice a day - Oxycodone 15 mg Q4PRN - Consult Ortho or IR in the morning for bone biopsy or aspirate   Chronic Microcytic Anemia: Hemoglobin increase from 8.1 to 9.0.   - Continue to monitor  Thrombocytosis: Platelets increased from 656 to 693. Patient has a history of thrombocytosis in the 400-600 range. This may be reactive thrombocytosis from osteomyelitis. -CBC  Hypertension: BP stable at 118/64. Home medications include clonidine 0.2 mg bid and amlodipine 10 mg daily.  Patient was hypotensive during admission in 80s/40s and blood pressure medication was held. - Patient is normotensive will continue holding home blood pressure medications and monitoring.  Lupus: Stable. Patient has history of lupus on plaquenil 200mg  BID and prednisone 10 mg daily.  -Continue home medications.  Dispo: Anticipated discharge in approximately 1 day(s).   Valinda Party, DO 07/05/2016, 4:54 PM Pager: [phone]

## 2016-07-05 NOTE — Progress Notes (Signed)
Occupational Therapy Treatment Patient Details Name: Katherine Banks MRN: [id number] DOB: 03-Oct-1966 Today's Date: 07/05/2016    History of present illness Pt is a 50 y.o. 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 Pt now admitted with chronic osteomyelitis in the residual bone in the remaining proximal femur. Possibility of hind-quarter amputation being considered. PMH: seizures, PTSD, lupus, hypertension, DVT, asthma, anxiety.    OT comments  Pt performed sponge bathing in long sitting and then seated on BSC in front of sink with set up to min assist.   Follow Up Recommendations  No OT follow up;Supervision/Assistance - 24 hour    Equipment Recommendations  None recommended by OT    Recommendations for Other Services      Precautions / Restrictions Precautions Precautions: Fall Restrictions Weight Bearing Restrictions: No       Mobility Bed Mobility Overal bed mobility: Needs Assistance Bed Mobility: Supine to Sit     Supine to sit: Min assist     General bed mobility comments: assist for R LE only  Transfers Overall transfer level: Needs assistance Equipment used: Rolling walker (2 wheeled) Transfers: Sit to/from Stand Sit to Stand: Min guard;Min assist         General transfer comment: Cues for hand placement.  Pt safe with technique and demonstrates good eccentric loading.      Balance Overall balance assessment: Needs assistance   Sitting balance-Leahy Scale: Good       Standing balance-Leahy Scale: Poor                     ADL Overall ADL's : Needs assistance/impaired     Grooming: Wash/dry hands;Wash/dry face;Oral care;Brushing hair;Applying deodorant;Sitting;Set up   Upper Body Bathing: Set up;Sitting   Lower Body Bathing: Minimal assistance;Sitting/lateral leans   Upper Body Dressing : Set up;Sitting   Lower Body Dressing: Minimal  assistance;Sitting/lateral leans Lower Body Dressing Details (indicate cue type and reason): assisted with R sock in long sitting, pt donned L Toilet Transfer: Ambulation;RW;Comfort height toilet;Minimal assistance Toilet Transfer Details (indicate cue type and reason): min assist to rise Toileting- Clothing Manipulation and Hygiene: Set up;Sitting/lateral lean       Functional mobility during ADLs: Min guard;Rolling walker;+2 for safety/equipment        Vision                     Perception     Praxis      Cognition   Behavior During Therapy: WFL for tasks assessed/performed Overall Cognitive Status: Within Functional Limits for tasks assessed                       Extremity/Trunk Assessment               Exercises     Shoulder Instructions       General Comments      Pertinent Vitals/ Pain       Pain Assessment: 0-10 Pain Score: 8  Pain Location: R LE Pain Descriptors / Indicators: Aching;Sore;Guarding;Grimacing Pain Intervention(s): Monitored during session;Premedicated before session;Repositioned;Ice applied  Home Living                                          Prior Functioning/Environment  Frequency Min 2X/week     Progress Toward Goals  OT Goals(current goals can now be found in the care plan section)  Progress towards OT goals: Progressing toward goals  Acute Rehab OT Goals Patient Stated Goal: less pain Time For Goal Achievement: 07/16/16 Potential to Achieve Goals: Good  Plan Discharge plan remains appropriate    Co-evaluation    PT/OT/SLP Co-Evaluation/Treatment: Yes Reason for Co-Treatment: For patient/therapist safety PT goals addressed during session: Mobility/safety with mobility;Balance OT goals addressed during session: ADL's and self-care      End of Session Equipment Utilized During Treatment: Gait belt;Rolling walker   Activity Tolerance Patient tolerated treatment  well   Patient Left in chair;with call bell/phone within reach;with nursing/sitter in room   Nurse Communication          Time: [id number] OT Time Calculation (min): 49 min  Charges: OT General Charges $OT Visit: 1 Procedure OT Treatments $Self Care/Home Management : 23-37 mins  Malka So 07/05/2016, 3:23 PM [phone]

## 2016-07-05 NOTE — Progress Notes (Signed)
Subjective: Pain exacerbation with decrease in recent dilaudid; she is concerned that because she is now on the same pain regimen as she is outside the hospital, she will have no choice but to return to the emergency department after she's discharged because the pain would be too severe. She denies fevers.   Objective: Vital signs in last 24 hours: Vitals:   07/04/16 1300 07/04/16 1329 07/04/16 2100 07/05/16 0557  BP: 131/60  (!) 150/61 118/62  Pulse: 85  93 74  Resp: 16  16 16   Temp: 98 F (36.7 C)  98.4 F (36.9 C) 97.8 F (36.6 C)  TempSrc: Oral  Oral Oral  SpO2: 96% 96% 99% 97%  Weight:      Height:       Weight change:   Intake/Output Summary (Last 24 hours) at 07/05/16 1054 Last data filed at 07/05/16 0900  Gross per 24 hour  Intake              480 ml  Output                0 ml  Net              480 ml    Physical Exam: General: no acute distress, blunted affect CV: 2/6 systolic murmur, 1+ bilateral pitting edema Resp: CTA, good air movement throughout Abdomen: soft, non-tender Right hip: extremely tender to light palpation, no sign of drainage or underlying fluctuance  Lab Results: CBC: WBC 12.5, Hgb 9.0, Hct 30, Plts 693  Micro Results: Recent Results (from the past 240 hour(s))  Urine culture     Status: Abnormal   Collection Time: 06/30/16  8:14 PM  Result Value Ref Range Status   Specimen Description URINE, RANDOM  Final   Special Requests NONE  Final   Culture MULTIPLE SPECIES PRESENT, SUGGEST RECOLLECTION (A)  Final   Report Status 07/02/2016 FINAL  Final  MRSA PCR Screening     Status: None   Collection Time: 06/30/16  9:56 PM  Result Value Ref Range Status   MRSA by PCR NEGATIVE NEGATIVE Final    Comment:        The GeneXpert MRSA Assay (FDA approved for NASAL specimens only), is one component of a comprehensive MRSA colonization surveillance program. It is not intended to diagnose MRSA infection nor to guide or monitor treatment  for MRSA infections.    Studies/Results: No results found. Medications: I have reviewed the patient's current medications. Scheduled Meds: . diclofenac sodium  4 g Topical TID AC & HS  . doxycycline  100 mg Oral BID  . feeding supplement (ENSURE ENLIVE)  237 mL Oral BID BM  . ferrous sulfate  325 mg Oral Daily  . fluticasone furoate-vilanterol  1 puff Inhalation Daily  . gabapentin  800 mg Oral TID  . heparin  5,000 Units Subcutaneous Q8H  . hydroxychloroquine  200 mg Oral BID  . lidocaine  1 patch Transdermal Q24H  . montelukast  10 mg Oral Daily  . naproxen  500 mg Oral BID WC  . nystatin ointment   Topical BID  . predniSONE  5 mg Oral Q breakfast  . senna-docusate  2 tablet Oral BID  . cyanocobalamin  500 mcg Oral Daily   Continuous Infusions:  PRN Meds:.acetaminophen **OR** acetaminophen, albuterol, cyclobenzaprine, dicyclomine, oxyCODONE, sodium chloride flush, traMADol, zolpidem Assessment/Plan: Principal Problem:   Osteomyelitis (HCC) Active Problems:   Lupus (systemic lupus erythematosus) (HCC)   History of inferior vena caval filter  placement   Chronic infection of right hip on antibiotics (H[facility])   HTN (hypertension)   History of pulmonary embolus (PE)  Osteomyelitis: Consulted Infectious Disease (Dr. [name]) to determine whether antibiotic regimen should be changed given the lack of improvement with 6 mo course of doxycycline.  Pain: Not adequately controlled on current regimen. Plan is to switch to a longer acting baseline pain med (she is allergic to morphine; insurance does not cover oxycontin; fentanyl patches have not worked for her historically) and continue with oxycodone 30mg  for breakthrough pain.   New murmur:  --Echo finds trivial mitral regurgitation  Continue with IV fluids to keep vein open, but reduce flow rate to lowest possible.  This is a Careers information officer Note.  The care of the patient was discussed with Dr. Quay Burow and the assessment and plan  formulated with their assistance.  Please see their attached note for official documentation of the daily encounter.   LOS: 5 days   [place]ale Chard, Medical Student 07/05/2016, 10:54 AM

## 2016-07-05 NOTE — Progress Notes (Signed)
Physical Therapy Treatment Patient Details Name: Katherine Banks MRN: [id number] DOB: 1965-12-10 Today's Date: 07/05/2016    History of Present Illness Pt is a 50 y.o. 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 Pt now admitted with chronic osteomyelitis in the residual bone in the remaining proximal femur. Possibility of hind-quarter amputation being considered. PMH: seizures, PTSD, lupus, hypertension, DVT, asthma, anxiety.     PT Comments    Pt performed increased mobility and required decreased assist during gait.  Pt is safe to d/c home and has all equipment.    Follow Up Recommendations  No PT follow up;Supervision for mobility/OOB     Equipment Recommendations  None recommended by PT    Recommendations for Other Services       Precautions / Restrictions Precautions Precautions: Fall Restrictions Weight Bearing Restrictions: No    Mobility  Bed Mobility Overal bed mobility: Needs Assistance Bed Mobility: Supine to Sit     Supine to sit: Min assist     General bed mobility comments: Pt remains to require min assist.  Pt able to move the rest of her body.    Transfers Overall transfer level: Needs assistance Equipment used: Rolling walker (2 wheeled) Transfers: Sit to/from Stand Sit to Stand: Min guard         General transfer comment: Cues for hand placement.  Pt safe with technique and demonstrates good eccentric loading.    Ambulation/Gait Ambulation/Gait assistance: Min guard Ambulation Distance (Feet): 60 Feet Assistive device: Rolling walker (2 wheeled) Gait Pattern/deviations: Step-to pattern;Trunk flexed;Antalgic Gait velocity: decreased   General Gait Details: Pt presents with NWB to RLE secondary to pain.  Pt took 3 standing breaks.  NO LOB noted.     Stairs            Wheelchair Mobility    Modified Rankin (Stroke Patients Only)        Balance Overall balance assessment: Needs assistance   Sitting balance-Leahy Scale: Good       Standing balance-Leahy Scale: Poor                      Cognition Arousal/Alertness: Awake/alert Behavior During Therapy: WFL for tasks assessed/performed Overall Cognitive Status: Within Functional Limits for tasks assessed                      Exercises      General Comments        Pertinent Vitals/Pain Pain Assessment: 0-10 Pain Score: 8  Pain Location: R thigh.   Pain Descriptors / Indicators: Aching;Sore Pain Intervention(s): Limited activity within patient's tolerance;Repositioned;Ice applied    Home Living                      Prior Function            PT Goals (current goals can now be found in the care plan section) Acute Rehab PT Goals Patient Stated Goal: less pain Potential to Achieve Goals: Good Progress towards PT goals: Progressing toward goals    Frequency  Min 3X/week    PT Plan Current plan remains appropriate    Co-evaluation PT/OT/SLP Co-Evaluation/Treatment: Yes Reason for Co-Treatment: For patient/therapist safety (previous LOb with NA and increased pain.  Performed co-treat to limit pain with mobility.  ) PT goals addressed during session: Mobility/safety with mobility;Balance OT goals addressed during session: ADL's and  self-care     End of Session Equipment Utilized During Treatment: Gait belt Activity Tolerance: Patient limited by pain Patient left: with call bell/phone within reach;in chair     Time: 1333-1417 PT Time Calculation (min) (ACUTE ONLY): 44 min  Charges:  $Gait Training: 8-22 mins                    G Codes:      Cristela Blue 2016/07/29, 2:30 PM  Governor Rooks, PTA pager [phone]

## 2016-07-06 ENCOUNTER — Encounter (HOSPITAL_COMMUNITY): Payer: Self-pay | Admitting: Interventional Radiology

## 2016-07-06 ENCOUNTER — Inpatient Hospital Stay (HOSPITAL_COMMUNITY): Payer: Medicare Other

## 2016-07-06 DIAGNOSIS — M86151 Other acute osteomyelitis, right femur: Secondary | ICD-10-CM

## 2016-07-06 HISTORY — PX: IR GENERIC HISTORICAL: IMG1180011

## 2016-07-06 LAB — CBC
HEMATOCRIT: 29.8 % — AB (ref 36.0–46.0)
HEMOGLOBIN: 8.9 g/dL — AB (ref 12.0–15.0)
MCH: 23.4 pg — ABNORMAL LOW (ref 26.0–34.0)
MCHC: 29.9 g/dL — AB (ref 30.0–36.0)
MCV: 78.4 fL (ref 78.0–100.0)
Platelets: 685 10*3/uL — ABNORMAL HIGH (ref 150–400)
RBC: 3.8 MIL/uL — ABNORMAL LOW (ref 3.87–5.11)
RDW: 18.2 % — AB (ref 11.5–15.5)
WBC: 12.3 10*3/uL — ABNORMAL HIGH (ref 4.0–10.5)

## 2016-07-06 LAB — GRAM STAIN

## 2016-07-06 MED ORDER — MIDAZOLAM HCL 2 MG/2ML IJ SOLN
INTRAMUSCULAR | Status: AC | PRN
  Administered 2016-07-06 (×2): 1 mg via INTRAVENOUS

## 2016-07-06 MED ORDER — DOXYCYCLINE HYCLATE 100 MG PO TABS: 100.0000 mg | ORAL_TABLET | Freq: Two times a day (BID) | ORAL | Status: DC

## 2016-07-06 MED ORDER — RIFAMPIN 300 MG PO CAPS: 300.0000 mg | ORAL_CAPSULE | Freq: Two times a day (BID) | ORAL | Status: DC

## 2016-07-06 MED ORDER — HYDROMORPHONE HCL 1 MG/ML IJ SOLN
INTRAMUSCULAR | Status: AC | PRN
  Administered 2016-07-06: 1 mg via INTRAVENOUS

## 2016-07-06 MED ORDER — MIDAZOLAM HCL 2 MG/2ML IJ SOLN
INTRAMUSCULAR | Status: AC
  Filled 2016-07-06: qty 2

## 2016-07-06 MED ORDER — HYDROMORPHONE HCL 1 MG/ML IJ SOLN
INTRAMUSCULAR | Status: AC
  Filled 2016-07-06: qty 2

## 2016-07-06 MED ORDER — LIDOCAINE-EPINEPHRINE (PF) 1 %-1:200000 IJ SOLN
INTRAMUSCULAR | Status: AC
  Administered 2016-07-06: 10 mL
  Filled 2016-07-06: qty 30

## 2016-07-06 NOTE — Progress Notes (Signed)
INFECTIOUS DISEASE PROGRESS NOTE  ID: Katherine Banks is a 50 y.o. 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 ([place])   HTN (hypertension)   History of pulmonary embolus ([facility])  Subjective: C/o pain in her hip  Abtx:  Anti-infectives    Start     Dose/Rate Route Frequency Ordered Stop   07/05/16 2000  doxycycline (VIBRA-TABS) tablet 100 mg  Status:  Discontinued     100 mg Oral 2 times daily 07/05/16 1528 07/05/16 1559   07/04/16 1030  doxycycline (VIBRA-TABS) tablet 100 mg  Status:  Discontinued     100 mg Oral 2 times daily 07/04/16 1022 07/05/16 1528   07/01/16 1830  fluconazole (DIFLUCAN) tablet 150 mg     150 mg Oral  Once 07/01/16 1758 07/01/16 1841   06/30/16 2200  hydroxychloroquine (PLAQUENIL) tablet 200 mg     200 mg Oral 2 times daily 06/30/16 2013     06/30/16 2200  doxycycline (VIBRA-TABS) tablet 100 mg  Status:  Discontinued     100 mg Oral 2 times daily 06/30/16 2013 07/01/16 1801      Medications:  Scheduled: . diclofenac sodium  4 g Topical TID AC & HS  . doxycycline  100 mg Oral BID  . feeding supplement (ENSURE ENLIVE)  237 mL Oral BID BM  . ferrous sulfate  325 mg Oral Q24H  . fluticasone furoate-vilanterol  1 puff Inhalation Daily  . gabapentin  800 mg Oral TID  . HYDROmorphone      . hydroxychloroquine  200 mg Oral BID  . lidocaine  1 patch Transdermal Q24H  . midazolam      . montelukast  10 mg Oral Daily  . naproxen  500 mg Oral BID WC  . nystatin ointment   Topical BID  . oxyCODONE  30 mg Oral Q12H  . predniSONE  5 mg Oral Q breakfast  . senna-docusate  2 tablet Oral BID  . cyanocobalamin  500 mcg Oral Daily    Objective: Vital signs in last 24 hours: Temp:  [98.1 F (36.7 C)-98.4 F (36.9 C)] 98.2 F (36.8 C) (09/06 1350) Pulse Rate:  [78-95] 90 (09/06 1450) Resp:   [11-20] 15 (09/06 1450) BP: (108-133)/(6-74) 115/58 (09/06 1450) SpO2:  [92 %-98 %] 92 % (09/06 1450)   General appearance: alert, cooperative and no distress Extremities: tenderness at R hip.   Lab Results  Recent Labs  07/05/16 0425 07/06/16 0500  WBC 12.5* 12.3*  HGB 9.0* 8.9*  HCT 30.0* 29.8*   Liver Panel No results for input(s): PROT, ALBUMIN, AST, ALT, ALKPHOS, BILITOT, BILIDIR, IBILI in the last 72 hours. Sedimentation Rate No results for input(s): ESRSEDRATE in the last 72 hours. C-Reactive Protein No results for input(s): CRP in the last 72 hours.  Microbiology: Recent Results (from the past 240 hour(s))  Urine culture     Status: Abnormal   Collection Time: 06/30/16  8:14 PM  Result Value Ref Range Status   Specimen Description URINE, RANDOM  Final   Special Requests NONE  Final   Culture MULTIPLE SPECIES PRESENT, SUGGEST RECOLLECTION (A)  Final   Report Status 07/02/2016 FINAL  Final  MRSA PCR Screening     Status: None   Collection Time: 06/30/16  9:56 PM  Result Value Ref Range Status   MRSA by PCR  NEGATIVE NEGATIVE Final    Comment:        The GeneXpert MRSA Assay (FDA approved for NASAL specimens only), is one component of a comprehensive MRSA colonization surveillance program. It is not intended to diagnose MRSA infection nor to guide or monitor treatment for MRSA infections.     Studies/Results: No results found.   Assessment/Plan: R hip chronic osteo SLE  Appreciate IMTS hard work with pt.  Await IR aspirate results.  Consider doxy/rif after aspirate results  Total days of antibiotics: off         Bobby Rumpf Infectious Diseases (pager) [phone] [email or web address] 07/06/2016, 4:23 PM  LOS: 6 days

## 2016-07-06 NOTE — Progress Notes (Signed)
   Subjective: Patient was evaluated this morning on rounds. She states she was able to get 5 hours of continuous sleep which is unusual for her. She feels that the new pain regimen is working and she feels that her pain has improved to a 5 out of 10 which she states is very good for her because it is usually a 10 out of 10 without the pain medication. She states she is a little more somnolent than normal. She reports having 2 bowel movements yesterday.  Objective:  Vital signs in last 24 hours: Vitals:   07/05/16 1224 07/05/16 2150 07/06/16 0520 07/06/16 1350  BP: 118/64 133/74 118/67 (!) 111/6  Pulse: 98 78 92 90  Resp: 16 18 20 16   Temp: 98 F (36.7 C) 98.4 F (36.9 C) 98.1 F (36.7 C) 98.2 F (36.8 C)  TempSrc: Oral Oral Oral   SpO2: 94% 98% 97% 98%  Weight:      Height:       Physical Exam  Constitutional:  Somnolent, speaking in complete sentences   Cardiovascular: Normal rate.  Exam reveals no gallop and no friction rub.   2/6 systolic murmur  Pulmonary/Chest: Effort normal. No respiratory distress.  Clear to auscultation bilaterally with no wheezes, rhonchi or rales  Abdominal: Soft. There is no tenderness.  Musculoskeletal: She exhibits no edema.  Skin: Skin is dry.    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  She has been receiving Flexeril 5 mg 2-3 times daily during admission.  She appears more somnolent today and she admits to being more sleepy than usual in conversation. We will hold Flexeril for now as her pain is being managed well with current pain management and do not want to  over sedate her. ID was consulted and recommended a repeat biopsy or aspirate to determine appropriate antibiotics. If biopsy or aspirate is not obtained it was recommended to give Bactrim and rifampin or Doxy and rifampin. Interventional radiology has agreed to do image guided aspiration of right hip scheduled for today. -Hold Flexeril 5 mg -Await results of aspirated bursa fluid   Chronic Microcytic Anemia: Hemoglobin 8.9   - Continue to monitor  Thrombocytosis: Platelets 685, slightly decreased from yesterday. Patient has a history of thrombocytosis in the 400-600 range. This may be reactive thrombocytosis from osteomyelitis. -CBC  Hypertension: BP stable at 118/67. Home medications include clonidine 0.2 mg bid and amlodipine 10 mg daily. Patient was hypotensive during admission in 80s/40s and blood pressure medication was held. - Patient is normotensive will continue holding home blood pressure medications and monitoring.  Lupus: Stable. Patient has history of lupus on plaquenil 200mg  BID and prednisone 10 mg daily.  -Continue home medications.  Dispo: Anticipated discharge in approximately 2 day(s).   Valinda Party, DO 07/06/2016, 2:20 PM Pager: [phone]
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
## 2016-07-06 NOTE — Care Management Important Message (Signed)
Important Message  Patient Details  Name: Katherine Banks MRN: [id number] Date of Birth: 03/19/1966   Medicare Important Message Given:  Yes    Katherine Banks 07/06/2016, 9:54 AM

## 2016-07-06 NOTE — Progress Notes (Signed)
Internal Medicine Attending:   I saw and examined the patient. I reviewed the resident's note and I agree with the resident's findings and plan as documented in the resident's note.  Patient appears more comfortable today. She has responded very nicely to the addition of OxyContin to her pain regimen. She has a little sedation today so we are going to stop Flexeril and keep a close eye on her. She is planned for aspiration of the fluid collection around her right hip today with interventional radiology. Last dose of doxycycline was yesterday morning, hopefully for aspiration will yield culture with sensitivities. I am in complete agreement with Dr. Algis Downs consultation recommending addition of rifampin to doxycycline. We will coordinate more with the surgical team tomorrow as we come to an overall treatment strategy for this very difficult problem.

## 2016-07-06 NOTE — Procedures (Signed)
Technically successful fluoro guided right hip resection site/residual joint space aspiration yielding approximately 30 cc of serous, slightly cloudy fluid. All aspirated fluid was sent to the lab for analysis. EBL: None No immediate post procedural complications.  Ronny Bacon, MD Pager #: [phone]

## 2016-07-06 NOTE — Sedation Documentation (Signed)
Approx 40cc tan color fluid-

## 2016-07-06 NOTE — Progress Notes (Signed)
Subjective: Patient feels significantly improved from previous days, having had a more complete night's sleep (2am-7am) and experienced much more pain relief with the new regimen. She continues to voice a "numb" pain in her lower back extending from the center to the right. During rounds visit, she mentions that she feels more sleepy than usual.  Objective: Vital signs in last 24 hours: Vitals:   07/05/16 0557 07/05/16 1224 07/05/16 2150 07/06/16 0520  BP: 118/62 118/64 133/74 118/67  Pulse: 74 98 78 92  Resp: 16 16 18 20   Temp: 97.8 F (36.6 C) 98 F (36.7 C) 98.4 F (36.9 C) 98.1 F (36.7 C)  TempSrc: Oral Oral Oral Oral  SpO2: 97% 94% 98% 97%  Weight:      Height:       Weight change:   Intake/Output Summary (Last 24 hours) at 07/06/16 0854 Last data filed at 07/05/16 1626  Gross per 24 hour  Intake              490 ml  Output                0 ml  Net              490 ml    Physical Exam: General: no acute distress, blunted affect CV: 2/6 systolic murmur, no appreciable pitting edema Resp: CTA, good air movement throughout Abdomen: soft, non-tender Right hip: able to tolerate light palpation; no sign of drainage or underlying fluctuance  Lab Results: CBC: WBC 12.3, Hgb 8.9, Hct 29.8, Plts 685  Micro Results: Recent Results (from the past 240 hour(s))  Urine culture     Status: Abnormal   Collection Time: 06/30/16  8:14 PM  Result Value Ref Range Status   Specimen Description URINE, RANDOM  Final   Special Requests NONE  Final   Culture MULTIPLE SPECIES PRESENT, SUGGEST RECOLLECTION (A)  Final   Report Status 07/02/2016 FINAL  Final  MRSA PCR Screening     Status: None   Collection Time: 06/30/16  9:56 PM  Result Value Ref Range Status   MRSA by PCR NEGATIVE NEGATIVE Final    Comment:        The GeneXpert MRSA Assay (FDA approved for NASAL specimens only), is one component of a comprehensive MRSA colonization surveillance program. It is not intended to  diagnose MRSA infection nor to guide or monitor treatment for MRSA infections.    Studies/Results: No results found. Medications: I have reviewed the patient's current medications. Scheduled Meds: . diclofenac sodium  4 g Topical TID AC & HS  . feeding supplement (ENSURE ENLIVE)  237 mL Oral BID BM  . ferrous sulfate  325 mg Oral Q24H  . fluticasone furoate-vilanterol  1 puff Inhalation Daily  . gabapentin  800 mg Oral TID  . hydroxychloroquine  200 mg Oral BID  . lidocaine  1 patch Transdermal Q24H  . montelukast  10 mg Oral Daily  . naproxen  500 mg Oral BID WC  . nystatin ointment   Topical BID  . oxyCODONE  30 mg Oral Q12H  . predniSONE  5 mg Oral Q breakfast  . senna-docusate  2 tablet Oral BID  . 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 [name]) to determine whether antibiotic regimen should be changed given the lack of improvement with 6 mo course of doxycycline. --Dr. [name] recommends, pending no further bone biopsy by orthopedics, adding rifampin to doxycycline or switching to bactrim and rifampin together, but is hesitant about bactrim given her multiple (14 in last 3 years per her report) bouts of nephritis --Interventional Radiology recommends second attempt at aspiration of right hip to determine cultures and sensitivities; they will plan to do so this afternoon (heparin held and made NPO)  Pain: Significantly better controlled with initiation of oxycontin 30 mg BID and oxycoone 15mg  Q4  PRN --d/c flexeril to see if it decreases fatigue    This is a Careers information officer Note.  The care of the patient was discussed with Dr. Quay Burow and the assessment and plan formulated with their assistance.  Please see their attached note for official documentation of the daily encounter.   LOS: 6 days   Glendale Chard, Medical Student 07/06/2016, 8:54 AM

## 2016-07-07 LAB — HEPATIC FUNCTION PANEL
ALT: 35 U/L (ref 14–54)
AST: 27 U/L (ref 15–41)
Albumin: 2.7 g/dL — ABNORMAL LOW (ref 3.5–5.0)
Alkaline Phosphatase: 119 U/L (ref 38–126)
BILIRUBIN TOTAL: 0.4 mg/dL (ref 0.3–1.2)
Total Protein: 6.7 g/dL (ref 6.5–8.1)

## 2016-07-07 LAB — HEMOGLOBIN A1C
Hgb A1c MFr Bld: 6.8 % — ABNORMAL HIGH (ref 4.8–5.6)
MEAN PLASMA GLUCOSE: 148 mg/dL

## 2016-07-07 LAB — CBC
HCT: 29 % — ABNORMAL LOW (ref 36.0–46.0)
HEMOGLOBIN: 8.5 g/dL — AB (ref 12.0–15.0)
MCH: 23.1 pg — AB (ref 26.0–34.0)
MCHC: 29.3 g/dL — AB (ref 30.0–36.0)
MCV: 78.8 fL (ref 78.0–100.0)
Platelets: 634 10*3/uL — ABNORMAL HIGH (ref 150–400)
RBC: 3.68 MIL/uL — AB (ref 3.87–5.11)
RDW: 18.2 % — ABNORMAL HIGH (ref 11.5–15.5)
WBC: 11.8 10*3/uL — ABNORMAL HIGH (ref 4.0–10.5)

## 2016-07-07 MED ORDER — RIFAMPIN 300 MG PO CAPS
300.0000 mg | ORAL_CAPSULE | Freq: Two times a day (BID) | ORAL | Status: DC
  Administered 2016-07-07 – 2016-07-08 (×3): 300 mg via ORAL
  Filled 2016-07-07 (×3): qty 1

## 2016-07-07 MED ORDER — DOXYCYCLINE HYCLATE 100 MG PO TABS
100.0000 mg | ORAL_TABLET | Freq: Two times a day (BID) | ORAL | Status: DC
  Administered 2016-07-07 – 2016-07-08 (×3): 100 mg via ORAL
  Filled 2016-07-07 (×3): qty 1

## 2016-07-07 MED ORDER — ENOXAPARIN SODIUM 40 MG/0.4ML ~~LOC~~ SOLN
40.0000 mg | SUBCUTANEOUS | Status: DC
  Administered 2016-07-07: 40 mg via SUBCUTANEOUS
  Filled 2016-07-07 (×2): qty 0.4

## 2016-07-07 NOTE — Progress Notes (Signed)
   Subjective:  She is doing well this morning. She still has pain but reports it is controlled.  Objective:  Vital signs in last 24 hours: Vitals:   07/06/16 1450 07/06/16 2001 07/07/16 0010 07/07/16 0509  BP: (!) 115/58 (!) 121/56 (!) 119/45 (!) 122/43  Pulse: 90 93 95 85  Resp: 15 16 16 17   Temp:  98 F (36.7 C) 98 F (36.7 C) 97.9 F (36.6 C)  TempSrc:  Oral Oral Oral  SpO2: 92% 98% 98% 98%  Weight:      Height:       General Apperance: NAD HEENT: Normocephalic, atraumatic, anicteric sclera Neck: Supple, trachea midline Lungs: Clear to auscultation bilaterally. No wheezes, rhonchi or rales. Breathing comfortably Heart: Regular rate and rhythm Abdomen: Soft, nontender, nondistended, no rebound/guarding Extremities: Warm and well perfused, no edema Skin: No rashes or lesions Neurologic: Alert and interactive. No gross deficits.  Assessment/Plan: 50 year old woman with history of chronic osteomyelitis, SLE, HTN presented with worsening of her right hip and lower back pain.   Acute on chronic osteomyelitis of the right hip: Aspirate culture growing GPC in pairs and chains -ID following, appreciate recommendations -Awaiting speciation and sensitivities -Continue Oxycontin and oxy IR prn, voltaren gel  Reactive thrombocytosis: Improving  HTN: Holding home clonidine and amlodipine  Lupus: continue home plaquenil and prednisone  VTE ppx: Lovenox FEN: Regular Code: FULL  Dispo: Anticipated discharge in approximately 1-2 day(s).   Milagros Loll, MD 07/07/2016, 12:15 PM Pager: [phone]

## 2016-07-07 NOTE — Progress Notes (Signed)
Physical Therapy Treatment Patient Details Name: Katherine Banks MRN: [id number] DOB: February 20, 1966 Today's Date: 07/07/2016    History of Present Illness Pt is a 50 y.o. 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 Pt now admitted with chronic osteomyelitis in the residual bone in the remaining proximal femur. Possibility of hind-quarter amputation being considered. PMH: seizures, PTSD, lupus, hypertension, DVT, asthma, anxiety.     PT Comments    Pt performed stair training in prep for d/c home.  Pt advanced gait distance.  Plan is to d/c home in am.    Follow Up Recommendations  No PT follow up;Supervision for mobility/OOB     Equipment Recommendations  None recommended by PT    Recommendations for Other Services       Precautions / Restrictions Precautions Precautions: Fall Restrictions Weight Bearing Restrictions: No    Mobility  Bed Mobility Overal bed mobility: Modified Independent Bed Mobility: Sit to Supine       Sit to supine: Min assist   General bed mobility comments: Required assist to lift RLE into bed  Transfers Overall transfer level: Needs assistance Equipment used: Rolling walker (2 wheeled) Transfers: Sit to/from Stand Sit to Stand: Supervision         General transfer comment: Cues for sequencing and hand placement.  Cues for backing entirely before sitting.    Ambulation/Gait Ambulation/Gait assistance: Min guard Ambulation Distance (Feet): 140 Feet Assistive device: Rolling walker (2 wheeled) Gait Pattern/deviations: Decreased stride length;Shuffle;Trunk flexed;Step-through pattern     General Gait Details: Pt bearing weight on RLE with step through pattern.  Pt required cues for keep RW close to body for support and safety.  Cues for upper trunk control.     Stairs Stairs: Yes Stairs assistance: Supervision Stair Management: Two  rails;Step to pattern;Forwards Number of Stairs: 8 General stair comments: Pt ascends with LLE and descends with LLE.  Pt performed this sequencing due to decrease function in RLE.    Wheelchair Mobility    Modified Rankin (Stroke Patients Only)       Balance     Sitting balance-Leahy Scale: Normal       Standing balance-Leahy Scale: Good                      Cognition Arousal/Alertness: Awake/alert Behavior During Therapy: WFL for tasks assessed/performed Overall Cognitive Status: Within Functional Limits for tasks assessed                      Exercises      General Comments        Pertinent Vitals/Pain Pain Assessment: 0-10 Pain Score: 4  Faces Pain Scale: Hurts little more Pain Location: R LE Pain Descriptors / Indicators: Grimacing;Guarding Pain Intervention(s): Monitored during session;Repositioned    Home Living                      Prior Function            PT Goals (current goals can now be found in the care plan section) Acute Rehab PT Goals Potential to Achieve Goals: Good Progress towards PT goals: Progressing toward goals    Frequency  Min 3X/week    PT Plan Current plan remains appropriate    Co-evaluation             End of Session Equipment Utilized During Treatment: Gait  belt Activity Tolerance: Patient limited by pain Patient left: with call bell/phone within reach;in chair     Time: 1514-1540 PT Time Calculation (min) (ACUTE ONLY): 26 min  Charges:  $Gait Training: 23-37 mins                    G Codes:      Cristela Blue 04-Aug-2016, 3:53 PM  Governor Rooks, PTA pager [phone]

## 2016-07-07 NOTE — Plan of Care (Signed)
Problem: Pain Managment: Goal: General experience of comfort will improve Outcome: Progressing Resting with pain med given.

## 2016-07-07 NOTE — Progress Notes (Signed)
INFECTIOUS DISEASE PROGRESS NOTE  ID: Katherine Banks is a 50 y.o. female with  Principal Problem:   Osteomyelitis ([place]) Active Problems:   Lupus (systemic lupus erythematosus) ([place])   History of inferior vena caval filter placement   Chronic infection of right hip on antibiotics (HCC)   HTN (hypertension)   History of pulmonary embolus (PE)  Subjective: Without complaints  Abtx:  Anti-infectives    Start     Dose/Rate Route Frequency Ordered Stop   07/06/16 2200  doxycycline (VIBRA-TABS) tablet 100 mg  Status:  Discontinued     100 mg Oral 2 times daily 07/06/16 1634 07/06/16 1728   07/06/16 2200  rifampin (RIFADIN) capsule 300 mg  Status:  Discontinued     300 mg Oral Every 12 hours 07/06/16 1635 07/06/16 2016   07/05/16 2000  doxycycline (VIBRA-TABS) tablet 100 mg  Status:  Discontinued     100 mg Oral 2 times daily 07/05/16 1528 07/05/16 1559   07/04/16 1030  doxycycline (VIBRA-TABS) tablet 100 mg  Status:  Discontinued     100 mg Oral 2 times daily 07/04/16 1022 07/05/16 1528   07/01/16 1830  fluconazole (DIFLUCAN) tablet 150 mg     150 mg Oral  Once 07/01/16 1758 07/01/16 1841   06/30/16 2200  hydroxychloroquine (PLAQUENIL) tablet 200 mg     200 mg Oral 2 times daily 06/30/16 2013     06/30/16 2200  doxycycline (VIBRA-TABS) tablet 100 mg  Status:  Discontinued     100 mg Oral 2 times daily 06/30/16 2013 07/01/16 1801      Medications:  Scheduled: . diclofenac sodium  4 g Topical TID AC & HS  . enoxaparin (LOVENOX) injection  40 mg Subcutaneous Q24H  . feeding supplement (ENSURE ENLIVE)  237 mL Oral BID BM  . ferrous sulfate  325 mg Oral Q24H  . fluticasone furoate-vilanterol  1 puff Inhalation Daily  . gabapentin  800 mg Oral TID  . hydroxychloroquine  200 mg Oral BID  . lidocaine  1 patch Transdermal Q24H  . montelukast  10 mg Oral Daily  . naproxen  500 mg Oral BID WC  . nystatin ointment   Topical BID  . oxyCODONE  30 mg Oral Q12H  . predniSONE  5 mg  Oral Q breakfast  . senna-docusate  2 tablet Oral BID  . cyanocobalamin  500 mcg Oral Daily    Objective: Vital signs in last 24 hours: Temp:  [97.9 F (36.6 C)-98.2 F (36.8 C)] 97.9 F (36.6 C) (09/07 0509) Pulse Rate:  [85-95] 85 (09/07 0509) Resp:  [11-20] 17 (09/07 0509) BP: (108-122)/(6-71) 122/43 (09/07 0509) SpO2:  [92 %-98 %] 98 % (09/07 0509)   General appearance: alert, cooperative, no distress and morbidly obese Extremities: mild-mod tenderness of R hip.   Lab Results  Recent Labs  07/06/16 0500 07/07/16 0430  WBC 12.3* 11.8*  HGB 8.9* 8.5*  HCT 29.8* 29.0*   Liver Panel No results for input(s): PROT, ALBUMIN, AST, ALT, ALKPHOS, BILITOT, BILIDIR, IBILI in the last 72 hours. Sedimentation Rate No results for input(s): ESRSEDRATE in the last 72 hours. C-Reactive Protein No results for input(s): CRP in the last 72 hours.  Microbiology: Recent Results (from the past 240 hour(s))  Urine culture     Status: Abnormal   Collection Time: 06/30/16  8:14 PM  Result Value Ref Range Status   Specimen Description URINE, RANDOM  Final   Special Requests NONE  Final   Culture MULTIPLE  SPECIES PRESENT, SUGGEST RECOLLECTION (A)  Final   Report Status 07/02/2016 FINAL  Final  MRSA PCR Screening     Status: None   Collection Time: 06/30/16  9:56 PM  Result Value Ref Range Status   MRSA by PCR NEGATIVE NEGATIVE Final    Comment:        The GeneXpert MRSA Assay (FDA approved for NASAL specimens only), is one component of a comprehensive MRSA colonization surveillance program. It is not intended to diagnose MRSA infection nor to guide or monitor treatment for MRSA infections.   Culture, body fluid-bottle     Status: None (Preliminary result)   Collection Time: 07/06/16  3:44 PM  Result Value Ref Range Status   Specimen Description FLUID RIGHT HIP JOINT FLUID  Final   Special Requests NONE  Final   Gram Stain   Final    GRAM POSITIVE COCCI IN PAIRS IN CHAINS IN  BOTH AEROBIC AND ANAEROBIC BOTTLES CRITICAL RESULT CALLED TO, READ BACK BY AND VERIFIED WITH: J ROBINSON,RN @0649  07/07/16 MKELLY    Culture PENDING  Incomplete   Report Status PENDING  Incomplete  Gram stain     Status: None   Collection Time: 07/06/16  3:44 PM  Result Value Ref Range Status   Specimen Description FLUID RIGHT HIP JOINT FLUID  Final   Special Requests NONE  Final   Gram Stain   Final    ABUNDANT WBC PRESENT, PREDOMINANTLY PMN NO ORGANISMS SEEN    Report Status 07/06/2016 FINAL  Final    Studies/Results: Ir Fluoro Guide Ndl Plmt / Bx  Result Date: 07/06/2016 INDICATION: History of septic arthritis, post removal of right total hip replacement with persistent indeterminate fluid collection about the right hip operative site worrisome for residual infection. Please performed fluoroscopic guided aspiration. EXAM: ARTHROCENTESIS/INJECTION OF LARGE JOINT COMPARISON:  Fluoroscopic guided right hip aspiration - 07/01/2016; 03/23/2016; 11/22/2025 CONTRAST:  None FLUOROSCOPY TIME:  18 seconds (2 mGy) COMPLICATIONS: None immediate PROCEDURE: Informed written consent was obtained from the patient after discussion of the risks, benefits and alternatives to treatment. The patient was placed prone on the fluoroscopy table and the right extremity was placed in a slight degree of flexion and internal rotation. The right hip was localized with fluoroscopy. The skin overlying the anterior aspect of the hip was prepped and draped in usual sterile fashion. An 18 gauge trocar needle was advanced into the right hip joint space about the superior medial aspect of the intratrochanteric region of the residual femur. A fluoroscopic image was saved and sent to PACs. A small amount of saline was injected into the joint space. Approximately 30 cc of serous though slightly cloudy fluid was then aspirated from the right hip resection site / residual joint space. The syringe was capped and sent to the laboratory  for analysis as ordered by the clinical team. The needle was removed and a dressing was placed. The patient tolerated procedure well without immediate postprocedural complication. IMPRESSION: Successful fluoroscopic guided aspiration of the right hip resection site / residual joint space yielding approximately 30 cc of serous though slightly cloudy fluid. Electronically Signed   By: Sandi Mariscal M.D.   On: 07/06/2016 16:34     Assessment/Plan: R hip chronic osteo SLE  IR aspirate shows GPC Will resume her anbx- discussed with Dr Linus Salmons who will follow her as an outpt. Rifampin/doxy Will avoid bactrim due to prev nephritis Await sensi She wishes to go home, she can be d/c and we can follow  her sensi as outpt if needed.   Total days of antibiotics: 0         Bobby Rumpf Infectious Diseases (pager) [phone] [email or web address] 07/07/2016, 12:27 PM  LOS: 7 days

## 2016-07-07 NOTE — Progress Notes (Signed)
Internal Medicine Attending:   I saw and examined the patient. I reviewed the resident's note and I agree with the resident's findings and plan as documented in the resident's note.  Doing better today. Pain well controlled with current regimen. Joint aspirate culture is growing GPCs which may yield sensitivities on which we can base further antibiotic decisions. We will need to arrange for these antibiotics, new pain regimen, home health, and ID follow up. Likely she will be able to discharge to home tomorrow.

## 2016-07-07 NOTE — Progress Notes (Signed)
Nutrition Follow-up  DOCUMENTATION CODES:   Severe malnutrition in context of acute illness/injury, Obesity unspecified  INTERVENTION:  Continue Ensure Enlive po BID, each supplement provides 350 kcal and 20 grams of protein.  Provide nourishment snacks. (Ordered)  Encourage adequate PO intake.   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 She reports that she has not been feeling like herself in the last week, has had fatigue, and reports 2 falls.  Pt reports appetite has been varied. Meal completion has been mostly 50%. Pt reports she has been trying to eat as much as she can at meals. Pt currently has Ensure ordered with varied consumption. Pt educated on the importance of adequate nutrition. Pt expressed understanding.   Labs and medications reviewed.   Diet Order:  Diet regular Room service appropriate? Yes; Fluid consistency: Thin  Skin:  Reviewed, no issues  Last BM:  9/5  Height:   Ht Readings from Last 1 Encounters:  06/30/16 5\' 6"  (1.676 m)    Weight:   Wt Readings from Last 1 Encounters:  06/30/16 237 lb (107.5 kg)    Ideal Body Weight:  59.1 kg  BMI:  Body mass index is 38.25 kg/m.  Estimated Nutritional Needs:   Kcal:  2000-2200  Protein:  105-120 grams  Fluid:  >/= 2 L/day  EDUCATION  NEEDS:   No education needs identified at this time  Corrin Parker, MS, RD, LDN Pager # [phone] After hours/ weekend pager # [phone]

## 2016-07-07 NOTE — Care Management Note (Signed)
Case Management Note  Patient Details  Name: Katherine Banks MRN: [id number] Date of Birth: 1966/02/05  Subjective/Objective:    50 yr old female admitted with osteo of her hip.                 Action/Plan:  Case manager spoke with patient. She states she has no need for Home health therapies. She will be staying with her om for next few weeks. Patient has no DME needs.     Expected Discharge Date:  07/02/16               Expected Discharge Plan:   Home Self Care  In-House Referral:  NA  Discharge planning Services  CM Consult  Post Acute Care Choice:    Choice offered to:  Patient  DME Arranged:  N/A DME Agency:     HH Arranged:    HH Agency:  NA  Status of Service:     If discussed at [place] of Stay Meetings, dates discussed:    Additional Comments:  Ninfa Meeker, RN 07/07/2016, 4:28 PM

## 2016-07-07 NOTE — Progress Notes (Signed)
CRITICAL VALUE ALERT  Critical value received: Body fluid + for gram cocci in pairs and chains Date of notification:  07/07/16 Time of notification: 0655 Critical value read back:Yes.    Nurse who received alert:  Delories Heinz. Quentin Cornwall  MD notified (1st page): Daubert  [phone] Time of first page:  0700  MD notified (2nd page): Dr. [name]  Time of second [id number]  Responding MD:Dr [name] Time MD [phone]

## 2016-07-07 NOTE — Progress Notes (Signed)
Occupational Therapy Treatment Patient Details Name: Katherine Banks MRN: [id number] DOB: 1966/01/29 Today's Date: 07/07/2016    History of present illness Pt is a 50 y.o. 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 Pt now admitted with chronic osteomyelitis in the residual bone in the remaining proximal femur. Possibility of hind-quarter amputation being considered. PMH: seizures, PTSD, lupus, hypertension, DVT, asthma, anxiety.    OT comments  Pt progressing steadily. Eager for her mother's visit today. Dressed in street clothes.  Follow Up Recommendations  No OT follow up;Supervision/Assistance - 24 hour    Equipment Recommendations  None recommended by OT    Recommendations for Other Services      Precautions / Restrictions Precautions Precautions: Fall Restrictions Weight Bearing Restrictions: No       Mobility Bed Mobility Overal bed mobility: Modified Independent             General bed mobility comments: HOB up  Transfers Overall transfer level: Needs assistance Equipment used: Rolling walker (2 wheeled) Transfers: Sit to/from Stand Sit to Stand: Supervision         General transfer comment: cues to align walker with chair prior to sitting    Balance     Sitting balance-Leahy Scale: Good       Standing balance-Leahy Scale: Poor                     ADL Overall ADL's : Needs assistance/impaired     Grooming: Wash/dry hands;Wash/dry face;Brushing hair;Sitting;Set up   Upper Body Bathing: Set up;Sitting   Lower Body Bathing: Supervison/ safety;Sit to/from stand;Sitting/lateral leans   Upper Body Dressing : Set up;Sitting   Lower Body Dressing: Supervision/safety;Sit to/from stand   Toilet Transfer: Ambulation;RW;Comfort height toilet;Supervision/safety           Functional mobility during ADLs: Rolling walker;Cueing for  safety;Supervision/safety        Vision                     Perception     Praxis      Cognition   Behavior During Therapy: [facility] for tasks assessed/performed Overall Cognitive Status: Within Functional Limits for tasks assessed                       Extremity/Trunk Assessment               Exercises     Shoulder Instructions       General Comments      Pertinent Vitals/ Pain       Pain Assessment: Faces Faces Pain Scale: Hurts a little bit Pain Location: R LE Pain Descriptors / Indicators: Grimacing;Guarding Pain Intervention(s): Repositioned;Premedicated before session;Monitored during session  Home Living                                          Prior Functioning/Environment              Frequency Min 2X/week     Progress Toward Goals  OT Goals(current goals can now be found in the care plan section)  Progress towards OT goals: Progressing toward goals  Acute Rehab OT Goals Time For Goal Achievement: 07/16/16 Potential to Achieve Goals: Good  Plan Discharge plan remains appropriate    Co-evaluation  End of Session Equipment Utilized During Treatment: Gait belt;Rolling walker   Activity Tolerance Patient tolerated treatment well   Patient Left in chair;with call bell/phone within reach;with family/visitor present   Nurse Communication          Time: 1207-1228 OT Time Calculation (min): 21 min  Charges: OT General Charges $OT Visit: 1 Procedure OT Treatments $Self Care/Home Management : 8-22 mins  Malka So 07/07/2016, 12:46 PM  [phone]

## 2016-07-08 DIAGNOSIS — B951 Streptococcus, group B, as the cause of diseases classified elsewhere: Secondary | ICD-10-CM

## 2016-07-08 LAB — URINE MICROSCOPIC-ADD ON: RBC / HPF: NONE SEEN RBC/hpf (ref 0–5)

## 2016-07-08 LAB — URINALYSIS, ROUTINE W REFLEX MICROSCOPIC
GLUCOSE, UA: NEGATIVE mg/dL
HGB URINE DIPSTICK: NEGATIVE
Ketones, ur: NEGATIVE mg/dL
Nitrite: NEGATIVE
PH: 6.5 (ref 5.0–8.0)
Protein, ur: NEGATIVE mg/dL
SPECIFIC GRAVITY, URINE: 1.021 (ref 1.005–1.030)

## 2016-07-08 LAB — CBC
HEMATOCRIT: 29.7 % — AB (ref 36.0–46.0)
HEMOGLOBIN: 8.8 g/dL — AB (ref 12.0–15.0)
MCH: 23.3 pg — ABNORMAL LOW (ref 26.0–34.0)
MCHC: 29.6 g/dL — AB (ref 30.0–36.0)
MCV: 78.8 fL (ref 78.0–100.0)
Platelets: 600 10*3/uL — ABNORMAL HIGH (ref 150–400)
RBC: 3.77 MIL/uL — ABNORMAL LOW (ref 3.87–5.11)
RDW: 18.3 % — ABNORMAL HIGH (ref 11.5–15.5)
WBC: 10.8 10*3/uL — AB (ref 4.0–10.5)

## 2016-07-08 MED ORDER — OXYCODONE HCL 15 MG PO TABS: 15.0000 mg | ORAL_TABLET | ORAL | 0 refills | Status: DC | PRN

## 2016-07-08 MED ORDER — NAPROXEN 500 MG PO TABS: 500.0000 mg | ORAL_TABLET | Freq: Two times a day (BID) | ORAL | 0 refills | Status: DC

## 2016-07-08 MED ORDER — DICLOFENAC SODIUM 1 % TD GEL: 4.0000 g | Freq: Three times a day (TID) | TRANSDERMAL | 0 refills | Status: DC

## 2016-07-08 MED ORDER — RIFAMPIN 300 MG PO CAPS: 300.0000 mg | ORAL_CAPSULE | Freq: Two times a day (BID) | ORAL | 3 refills | Status: DC

## 2016-07-08 MED ORDER — DOXYCYCLINE HYCLATE 100 MG PO TABS: 100.0000 mg | ORAL_TABLET | Freq: Two times a day (BID) | ORAL | 1 refills | Status: DC

## 2016-07-08 MED ORDER — HEPARIN SOD (PORK) LOCK FLUSH 100 UNIT/ML IV SOLN
500.0000 [IU] | INTRAVENOUS | Status: AC | PRN
  Administered 2016-07-08: 500 [IU]

## 2016-07-08 MED ORDER — NYSTATIN 100000 UNIT/GM EX OINT: TOPICAL_OINTMENT | Freq: Two times a day (BID) | CUTANEOUS | 0 refills | Status: DC

## 2016-07-08 MED ORDER — OXYCODONE HCL ER 30 MG PO T12A
30.0000 mg | EXTENDED_RELEASE_TABLET | Freq: Two times a day (BID) | ORAL | 0 refills | Status: DC
Start: 2016-07-08 — End: 2018-05-16

## 2016-07-08 MED ORDER — LIDOCAINE 5 % EX PTCH: 1.0000 | MEDICATED_PATCH | CUTANEOUS | 0 refills | Status: DC

## 2016-07-08 NOTE — Progress Notes (Signed)
INFECTIOUS DISEASE PROGRESS NOTE  ID: Katherine Banks is a 50 y.o. 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  Has noted red secretions  Abtx:  Anti-infectives    Start     Dose/Rate Route Frequency Ordered Stop   07/08/16 0000  doxycycline (VIBRA-TABS) 100 MG tablet     100 mg Oral 2 times daily 07/08/16 1136     07/08/16 0000  rifampin (RIFADIN) 300 MG capsule     300 mg Oral Every 12 hours 07/08/16 1136     07/07/16 1245  doxycycline (VIBRA-TABS) tablet 100 mg     100 mg Oral 2 times daily 07/07/16 1238     07/07/16 1245  rifampin (RIFADIN) capsule 300 mg     300 mg Oral Every 12 hours 07/07/16 1238     07/06/16 2200  doxycycline (VIBRA-TABS) tablet 100 mg  Status:  Discontinued     100 mg Oral 2 times daily 07/06/16 1634 07/06/16 1728   07/06/16 2200  rifampin (RIFADIN) capsule 300 mg  Status:  Discontinued     300 mg Oral Every 12 hours 07/06/16 1635 07/06/16 2016   07/05/16 2000  doxycycline (VIBRA-TABS) tablet 100 mg  Status:  Discontinued     100 mg Oral 2 times daily 07/05/16 1528 07/05/16 1559   07/04/16 1030  doxycycline (VIBRA-TABS) tablet 100 mg  Status:  Discontinued     100 mg Oral 2 times daily 07/04/16 1022 07/05/16 1528   07/01/16 1830  fluconazole (DIFLUCAN) tablet 150 mg     150 mg Oral  Once 07/01/16 1758 07/01/16 1841   06/30/16 2200  hydroxychloroquine (PLAQUENIL) tablet 200 mg     200 mg Oral 2 times daily 06/30/16 2013     06/30/16 2200  doxycycline (VIBRA-TABS) tablet 100 mg  Status:  Discontinued     100 mg Oral 2 times daily 06/30/16 2013 07/01/16 1801      Medications:  Scheduled: . diclofenac sodium  4 g Topical TID AC & HS  . doxycycline  100 mg Oral BID  . enoxaparin  (LOVENOX) injection  40 mg Subcutaneous Q24H  . feeding supplement (ENSURE ENLIVE)  237 mL Oral BID BM  . ferrous sulfate  325 mg Oral Q24H  . fluticasone furoate-vilanterol  1 puff Inhalation Daily  . gabapentin  800 mg Oral TID  . hydroxychloroquine  200 mg Oral BID  . lidocaine  1 patch Transdermal Q24H  . montelukast  10 mg Oral Daily  . naproxen  500 mg Oral BID WC  . nystatin ointment   Topical BID  . oxyCODONE  30 mg Oral Q12H  . predniSONE  5 mg Oral Q breakfast  . rifampin  300 mg Oral Q12H  . senna-docusate  2 tablet Oral BID  . cyanocobalamin  500 mcg Oral Daily    Objective: Vital signs in last 24 hours: Temp:  [97.6 F (36.4 C)-98.2 F (36.8 C)] 98.2 F (36.8 C) (09/08 1244) Pulse Rate:  [79-105] 88 (09/08 1244) Resp:  [16-17] 16 (09/08 1244) BP: (116-145)/(54-73) 145/73 (09/08 1244) SpO2:  [98 %-100 %] 98 % (09/08 1244)   General appearance: alert, cooperative and no distress Extremities: tenderness over R hip.   Lab Results  Recent Labs  07/07/16 0430 07/08/16 0443  WBC 11.8* 10.8*  HGB 8.5* 8.8*  HCT 29.0* 29.7*   Liver Panel  Recent Labs  07/07/16 1300  PROT 6.7  ALBUMIN 2.7*  AST 27  ALT 35  ALKPHOS 119  BILITOT 0.4  BILIDIR <0.1*  IBILI NOT CALCULATED   Sedimentation Rate No results for input(s): ESRSEDRATE in the last 72 hours. C-Reactive Protein No results for input(s): CRP in the last 72 hours.  Microbiology: Recent Results (from the past 240 hour(s))  Urine culture     Status: Abnormal   Collection Time: 06/30/16  8:14 PM  Result Value Ref Range Status   Specimen Description URINE, RANDOM  Final   Special Requests NONE  Final   Culture MULTIPLE SPECIES PRESENT, SUGGEST RECOLLECTION (A)  Final   Report Status 07/02/2016 FINAL  Final  MRSA PCR Screening     Status: None   Collection Time: 06/30/16  9:56 PM  Result Value Ref Range Status   MRSA by PCR NEGATIVE NEGATIVE Final    Comment:        The GeneXpert MRSA Assay  (FDA approved for NASAL specimens only), is one component of a comprehensive MRSA colonization surveillance program. It is not intended to diagnose MRSA infection nor to guide or monitor treatment for MRSA infections.   Culture, body fluid-bottle     Status: None (Preliminary result)   Collection Time: 07/06/16  3:44 PM  Result Value Ref Range Status   Specimen Description FLUID RIGHT HIP JOINT FLUID  Final   Special Requests NONE  Final   Gram Stain   Final    GRAM POSITIVE COCCI IN PAIRS IN CHAINS IN BOTH AEROBIC AND ANAEROBIC BOTTLES CRITICAL RESULT CALLED TO, READ BACK BY AND VERIFIED WITH: J ROBINSON,RN @0649  07/07/16 MKELLY    Culture GRAM POSITIVE COCCI  Final   Report Status PENDING  Incomplete  Gram stain     Status: None   Collection Time: 07/06/16  3:44 PM  Result Value Ref Range Status   Specimen Description FLUID RIGHT HIP JOINT FLUID  Final   Special Requests NONE  Final   Gram Stain   Final    ABUNDANT WBC PRESENT, PREDOMINANTLY PMN NO ORGANISMS SEEN    Report Status 07/06/2016 FINAL  Final    Studies/Results: No results found.   Assessment/Plan: R hip chronic osteo SLE  IR aspirate shows GPC, cx pending still Will continue her anbx- discussed with Dr Linus Salmons who will follow her as an outpt. Can be d/c and we can follow her sensi as outpt if needed.  Dr Megan Salon is available if questions over the weekend.   Day 1 Rifampin/doxy         Bobby Rumpf Infectious Diseases (pager) [phone] [email or web address] 07/08/2016, 3:43 PM  LOS: 8 days

## 2016-07-08 NOTE — Progress Notes (Signed)
Reviewed discharge papers and medications, gave copy to P-tar

## 2016-07-08 NOTE — Discharge Instructions (Signed)
Katherine Banks,  It was a pleasure to take care of you. We have changed your antibiotic regimen for your osteomyelitis. Please follow up with your primary care doctor and your orthopedic surgeon as listed in this packet.  We have also changed your pain medication regimen. Please be very careful about taking only what is prescribed. Please notify your pain physician about your new regimens.    Thank you for allowing [place] to be involved in your healthcare while you were hospitalized at [facility].   Please note that there have been changes to your home medications.  --> PLEASE LOOK AT YOUR DISCHARGE MEDICATION LIST FOR DETAILS  Please call your PCP if you have any questions or concerns, or any difficulty getting any of your medications.  Please return to the ER if you have worsening of your symptoms or new severe symptoms arise.

## 2016-07-08 NOTE — Progress Notes (Signed)
   Subjective: Patient was evaluated this morning on rounds. She continues to endorse pain in her right hip but states that the current pain regimen is controlling the pain. She states that she walked down the halls and upstairs yesterday and this increased her pain. She had 2 bowel movements yesterday. She endorses dysuria and increased frequency in urination that has increased in the last 2 days. She denies fever, chills, shortness of breath and chest pain.  Objective:  Vital signs in last 24 hours: Vitals:   07/07/16 0509 07/07/16 1416 07/07/16 2046 07/08/16 0607  BP: (!) 122/43 (!) 125/54 136/68 (!) 116/54  Pulse: 85 89 (!) 105 79  Resp: 17 16 17 16   Temp: 97.9 F (36.6 C) 98.2 F (36.8 C) 98.1 F (36.7 C) 97.6 F (36.4 C)  TempSrc: Oral  Oral   SpO2: 98% 97% 100% 100%  Weight:      Height:       Physical Exam  Constitutional:  Alert and speaking in complete sentences Cardiovascular: Normal rate.  Exam reveals no gallop and no friction rub.   No murmur noted Pulmonary/Chest: Effort normal. No respiratory distress.  Clear to auscultation bilaterally with no wheezes, rhonchi or rales  Abdominal: Soft. There is no tenderness.  Musculoskeletal: She exhibits no edema.  Skin: Skin is dry.   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  Patient was started on rifampin and doxycycline. ID will follow up with sensitivities. Patient's pain is stable. She has follow-up appointment with her pain management clinic on 9/13 and she stated she contacted them to let them know that she was in the hospital receiving care for her acute on chronic  osteomyelitis. - Rifampin 300mg  bid and doxycyclinemg 100 bid - Continue Oxycontin and oxy IR prn, voltaren gel - Follow up with ID on 10/10 - Follow up with ortho on 9/25 - Follow up with [facility] on 9/15  Dysuria and Urinary frequency Patient states she has had increased urinary burning and frequency in the last 48hrs -UA and culture  Reactive thrombocytosis: Improving  HTN: BP 116/54 Continue holding home clonidine and amlodipine   Lupus: Stable. Patient has history of lupus on plaquenil 200mg  BID and prednisone 10 mg daily.  -Continue home medications.   Dispo: Anticipated discharge today. Valinda Party, DO 07/08/2016, 11:12 AM Pager: [phone]

## 2016-07-10 LAB — CULTURE, BODY FLUID-BOTTLE

## 2016-07-10 LAB — URINE CULTURE: Culture: >=100000 — AB

## 2016-07-10 LAB — CULTURE, BODY FLUID W GRAM STAIN -BOTTLE

## 2016-07-11 ENCOUNTER — Other Ambulatory Visit: Payer: Self-pay | Admitting: Infectious Diseases

## 2016-07-11 ENCOUNTER — Other Ambulatory Visit: Payer: Self-pay | Admitting: Internal Medicine

## 2016-07-11 ENCOUNTER — Telehealth: Payer: Self-pay | Admitting: Infectious Diseases

## 2016-07-11 ENCOUNTER — Telehealth: Payer: Self-pay | Admitting: Internal Medicine

## 2016-07-11 MED ORDER — NITROFURANTOIN MACROCRYSTAL 100 MG PO CAPS: 100.0000 mg | ORAL_CAPSULE | Freq: Two times a day (BID) | ORAL | 0 refills | Status: DC

## 2016-07-11 MED ORDER — CEPHALEXIN 500 MG PO CAPS: 500.0000 mg | ORAL_CAPSULE | Freq: Two times a day (BID) | ORAL | 3 refills | Status: DC

## 2016-07-11 MED ORDER — NITROFURANTOIN MACROCRYSTAL 100 MG PO CAPS: 100.0000 mg | ORAL_CAPSULE | Freq: Four times a day (QID) | ORAL | 0 refills | Status: DC

## 2016-07-11 NOTE — Telephone Encounter (Signed)
Pt states returning doctor's call; received no voicemail (having problems with her phone). Inform pt - positive urine cx; [facility]- states received called from [facility] - has rx for Macrobid.  Phone note has Macrobid 100 mg BID x 3 days; med list has 100 mg 4 times daily.

## 2016-07-11 NOTE — Telephone Encounter (Signed)
cx grew Group b strep.  Will change her doxy to keflex Pt called and left message on her VM

## 2016-07-11 NOTE — Telephone Encounter (Signed)
Urine Culture grew Enterococcus resistant to Levaquin and Ampicillin, but sensitive to nitrofurantoin and vancomycin.   I have sent in a prescription for Macrobid 100 mg BID for 3 days. I called patient and left her a voicemail.  Martyn Malay, DO PGY-3 Internal Medicine Resident Pager # [phone] 07/11/2016 3:16 PM

## 2016-07-12 NOTE — Telephone Encounter (Signed)
Spoke with pt She will pick up new Rx in AM.

## 2016-07-15 ENCOUNTER — Ambulatory Visit (INDEPENDENT_AMBULATORY_CARE_PROVIDER_SITE_OTHER): Payer: Medicare Other | Admitting: Internal Medicine

## 2016-07-15 VITALS — BP 113/42 | HR 79 | Temp 98.3°F | Ht 66.0 in | Wt 246.1 lb

## 2016-07-15 DIAGNOSIS — D75839 Thrombocytosis, unspecified: Secondary | ICD-10-CM | POA: Insufficient documentation

## 2016-07-15 DIAGNOSIS — Z87891 Personal history of nicotine dependence: Secondary | ICD-10-CM | POA: Diagnosis not present

## 2016-07-15 DIAGNOSIS — I10 Essential (primary) hypertension: Secondary | ICD-10-CM | POA: Diagnosis not present

## 2016-07-15 DIAGNOSIS — Z79899 Other long term (current) drug therapy: Secondary | ICD-10-CM | POA: Diagnosis not present

## 2016-07-15 DIAGNOSIS — M86651 Other chronic osteomyelitis, right thigh: Secondary | ICD-10-CM | POA: Diagnosis present

## 2016-07-15 DIAGNOSIS — D473 Essential (hemorrhagic) thrombocythemia: Secondary | ICD-10-CM | POA: Diagnosis not present

## 2016-07-15 DIAGNOSIS — Z993 Dependence on wheelchair: Secondary | ICD-10-CM | POA: Diagnosis not present

## 2016-07-15 DIAGNOSIS — B9689 Other specified bacterial agents as the cause of diseases classified elsewhere: Secondary | ICD-10-CM

## 2016-07-15 MED ORDER — CEPHALEXIN 500 MG PO CAPS: 500.0000 mg | ORAL_CAPSULE | Freq: Two times a day (BID) | ORAL | 3 refills | Status: AC

## 2016-07-15 NOTE — Progress Notes (Signed)
Internal Medicine Clinic Attending  I saw and evaluated the patient.  I personally confirmed the key portions of the history and exam documented by Dr. Amin and I reviewed pertinent patient test results.  The assessment, diagnosis, and plan were formulated together and I agree with the documentation in the resident's note. 
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
## 2016-07-15 NOTE — Assessment & Plan Note (Signed)
She had a recrudescence of her thrombocytosis during this hospital stay, most probably due to osteomyelitis. Her last platelet count was 600.  We tried to get some blood for CBC, but remain unsuccessful in spite of multiple attempts. She will come back on Monday to work with the IV team, hopefully we can get some answers at that time.

## 2016-07-15 NOTE — Assessment & Plan Note (Signed)
BP Readings from Last 3 Encounters:  07/15/16 (!) 113/42  07/08/16 (!) 145/73  06/30/16 112/65   She is normotensive on Bentyl and low-dose Lasix.  Continue with the same management.

## 2016-07-15 NOTE — Patient Instructions (Addendum)
IT was pleasure taking care of you today. I did sent a new prescription for Keflex today. Please keep taking your medicine as prescribed. Please go to your Ortho. Appointment and follow their recommendations. Pain clinic will help you with your management.

## 2016-07-15 NOTE — Assessment & Plan Note (Signed)
She has a decent recurrence of her osteomyelitis.At the time of this admission. MRI  showed about 9 cm of proximal femoral osteomyelitis with associated fluid collection. This fluid collection was drained by CT guidance with VIR. Cultures returned with group B strep. The patient was discharged with doxycycline and rifampin after the consultation of infectious disease. Her doxycycline was changed with Keflex after her discharge by ID physician. She is still having significant amount of pain,She was given oxycontin 30 mg twice a day which she never got it from pharmacy yet. She is using short-acting Roxicodone 15 mg every 4 hourly with minimum relief. She states that she was given Lidoderm patch during her hospital stay which greatly helped with her pain, but she is unable to get after discharge as her insurance don't want to pay for that. She is following up in the pain management clinic for her pain.   I advised her to get her OxyContin from pharmacy today and use that, that might control her pain better and decrease the need of Roxicodone.  -She should continue following up the pain management clinic. -She had an upcoming appointment coming up with Dr. Ninfa Linden orthopedic surgeon on September 24 to discuss her further management. -Continue Keflex and rifampin.

## 2016-07-18 ENCOUNTER — Other Ambulatory Visit (INDEPENDENT_AMBULATORY_CARE_PROVIDER_SITE_OTHER): Payer: Medicare Other

## 2016-07-18 ENCOUNTER — Telehealth: Payer: Self-pay | Admitting: *Deleted

## 2016-07-18 DIAGNOSIS — D473 Essential (hemorrhagic) thrombocythemia: Secondary | ICD-10-CM | POA: Diagnosis present

## 2016-07-18 DIAGNOSIS — D75839 Thrombocytosis, unspecified: Secondary | ICD-10-CM

## 2016-07-18 LAB — CBC
HEMATOCRIT: 26.2 % — AB (ref 36.0–46.0)
HEMOGLOBIN: 8.1 g/dL — AB (ref 12.0–15.0)
MCH: 23.6 pg — ABNORMAL LOW (ref 26.0–34.0)
MCHC: 30.9 g/dL (ref 30.0–36.0)
MCV: 76.4 fL — ABNORMAL LOW (ref 78.0–100.0)
Platelets: 440 10*3/uL — ABNORMAL HIGH (ref 150–400)
RBC: 3.43 MIL/uL — ABNORMAL LOW (ref 3.87–5.11)
RDW: 17.3 % — ABNORMAL HIGH (ref 11.5–15.5)
WBC: 12.1 10*3/uL — AB (ref 4.0–10.5)

## 2016-07-18 MED ORDER — DICYCLOMINE HCL 10 MG PO CAPS: 20.0000 mg | ORAL_CAPSULE | Freq: Two times a day (BID) | ORAL | 3 refills | Status: DC | PRN

## 2016-07-18 NOTE — Telephone Encounter (Signed)
Message from Pharmacy to change Dicyclomine 20 mg tablets to another drug.  Drug is not available at patient's Pharmacy.  Message sent to Dr. Tiburcio Pea.  Sander Nephew, RN 07/18/2016 3:42 PM

## 2016-07-18 NOTE — Addendum Note (Signed)
Addended by: [name] A on: 07/18/2016 05:28 PM   Modules accepted: Orders

## 2016-07-18 NOTE — Telephone Encounter (Signed)
Fax

## 2016-08-01 ENCOUNTER — Ambulatory Visit (INDEPENDENT_AMBULATORY_CARE_PROVIDER_SITE_OTHER): Payer: Medicare Other | Admitting: Physician Assistant

## 2016-08-01 DIAGNOSIS — M71051 Abscess of bursa, right hip: Secondary | ICD-10-CM

## 2016-08-09 ENCOUNTER — Inpatient Hospital Stay: Payer: Medicare Other | Admitting: Internal Medicine

## 2016-08-10 ENCOUNTER — Other Ambulatory Visit: Payer: Self-pay | Admitting: Internal Medicine

## 2016-08-12 ENCOUNTER — Telehealth: Payer: Self-pay | Admitting: Internal Medicine

## 2016-08-12 ENCOUNTER — Other Ambulatory Visit: Payer: Self-pay | Admitting: *Deleted

## 2016-08-12 NOTE — Telephone Encounter (Signed)
Patient requesting a refill on her cyclobenzaprine.  Patient also would like to talk to someone about her upcoming surgery.

## 2016-08-12 NOTE — Telephone Encounter (Signed)
Pt calls and states she was referred to a mental health provider by her pain management doctor for depression r/t to her illness but the one she was referred to will not take medicare/ medicaid. She would like some help finding a provider for mental health services, sending to [email or web address] for advice Note sent to pcp, shana, attending

## 2016-08-15 ENCOUNTER — Telehealth: Payer: Self-pay | Admitting: Licensed Clinical Social Worker

## 2016-08-15 ENCOUNTER — Other Ambulatory Visit: Payer: Self-pay | Admitting: *Deleted

## 2016-08-15 NOTE — Telephone Encounter (Signed)
Thank you. CSW will contact [name] and provide referral to several agencies that accept both Medicare/Medicaid.

## 2016-08-15 NOTE — Telephone Encounter (Signed)
This was done 10/13 shana has addressed, waiting for refill

## 2016-08-15 NOTE — Telephone Encounter (Signed)
Pt in need of a behavioral health provider that accepts both Medicare and Medicaid/[place] LME.

## 2016-08-16 NOTE — Telephone Encounter (Signed)
CSW placed call to [name] to provide listing and agencies that would be in-network for both Medicare and Medicaid.  Pt was previously referred to [facility] by pain management clinic.  Pt was tearful during this conversation.  CSW inquired if pt was doing well and needed someone to speak with today, pt requesting to call this worker back.

## 2016-08-16 NOTE — Telephone Encounter (Signed)
Pt would like flexeril script sent in

## 2016-08-17 ENCOUNTER — Telehealth: Payer: Self-pay

## 2016-08-17 ENCOUNTER — Telehealth: Payer: Self-pay | Admitting: *Deleted

## 2016-08-17 MED ORDER — CYCLOBENZAPRINE HCL 10 MG PO TABS: 10.0000 mg | ORAL_TABLET | Freq: Every day | ORAL | 1 refills | Status: DC | PRN

## 2016-08-17 MED ORDER — DULOXETINE HCL 30 MG PO CPEP: 30.0000 mg | ORAL_CAPSULE | Freq: Every day | ORAL | 2 refills | Status: DC

## 2016-08-17 NOTE — Telephone Encounter (Signed)
CSW placed call to [name] to follow up on patient from yesterdays call.  Pt states she just found out about the death of a family friend yesterday.  Katherine Banks reports she was referred to [facility] [place] for mental health services.  However, pt was informed they do not accept Medicare.  Pt contacted [facility] to obtain a behavioral health provider in-network with her primary and secondary insurance. CSW provided emotional support and the phone number to [facility] as a mobile crisis team for patient to have should she feel in crisis.  Pt voiced frustration with multiple chronic illness and recently informed she may have to have her leg amputated.  CSW discuss several agencies that are in-network. Pt informed [facility] is scheduling out several weeks for psychiatry but psychotherapy times are sooner.  Katherine Banks would like to be seen by psychiatry prior to late this year.  Referral to [facility] faxed.

## 2016-08-17 NOTE — Telephone Encounter (Addendum)
Call to patient to ask where she is applying the Dicofenac Gel.  Patient stated that she is using the Gel on her right leg for pain.  Patient also stated that she needed a refill on her Flexeril and wanted to see if she could get this as well.  Message to be sent to the Triage Nurse . Sander Nephew, RN 08/17/2016 10:15 AM.   Call to [facility]. Tracks for Prior Authorization for Diclofenac Gel.  Request was changed to Voltaren Gel and no prior authorization was required.  Sander Nephew, RN 08/17/2016 11:11 AM

## 2016-08-17 NOTE — Telephone Encounter (Signed)
Ebony Hail from [place] requesting to speak with a nurse regarding Voltaren gel.

## 2016-08-17 NOTE — Telephone Encounter (Signed)
Rtc, gave script for voltaren gel verbally

## 2016-08-17 NOTE — Telephone Encounter (Signed)
I know Katherine Banks well from her recent hospital admission. She is in a tough spot with her chronic right hip infection. I called her on the phone, the oxycontin and oxycodone regimen is helpful to her, she is having no somnolence or other side effect. She is feeling more depressed and anxious about the recommendation to have a right hip disarticulation. We discussed her options, risks, and benefits of various medicine adjustments. I have prescribed flexeril 10mg  to be used as needed judiciously, she has done well with this in the past for her chronic lower back cramping, she is stuck in bed constantly now because of her hip. I have also prescribed duloxetine 30mg  as a starting dose. I asked her to come in to [facility] for follow up in two weeks so we can uptitrate this medication as able.

## 2016-08-18 ENCOUNTER — Other Ambulatory Visit: Payer: Self-pay | Admitting: *Deleted

## 2016-08-18 ENCOUNTER — Inpatient Hospital Stay: Payer: Medicare Other | Admitting: Internal Medicine

## 2016-08-18 ENCOUNTER — Telehealth: Payer: Self-pay | Admitting: *Deleted

## 2016-08-18 MED ORDER — HYDROXYCHLOROQUINE SULFATE 200 MG PO TABS: 200.0000 mg | ORAL_TABLET | Freq: Two times a day (BID) | ORAL | 1 refills | Status: DC

## 2016-08-18 NOTE — Telephone Encounter (Signed)
Message from patient wants a refill on her Lupus Med. Plaquenil as well.  Sander Nephew, RN 08/18/2016 5:04 AM

## 2016-08-23 ENCOUNTER — Other Ambulatory Visit: Payer: Self-pay | Admitting: Internal Medicine

## 2016-08-24 ENCOUNTER — Telehealth: Payer: Self-pay | Admitting: *Deleted

## 2016-08-24 NOTE — Telephone Encounter (Addendum)
Information faxed to Cover[employer]Meds for [place] for Lidocaine Patches 1%.  Awaiting response from company. Sander Nephew, RN 08/24/2016 10;07 AM. Denial response for Lidocaine patches .  Forms to be placed in Dr. Quay Burow' box if an appeal is needed.  Sander Nephew, RN  08/25/2016 9:29 AM

## 2016-09-01 NOTE — Telephone Encounter (Signed)
CSW placed f/u call with [facility] to inquire if Katherine Banks has been scheduled or has established care.  Agency did not have referral.  Agency provided CSW with alternate fax number.  CSW re-faxed referral to both referral fax numbers provided.

## 2016-09-05 ENCOUNTER — Other Ambulatory Visit: Payer: Self-pay | Admitting: Internal Medicine

## 2016-09-05 ENCOUNTER — Encounter (HOSPITAL_COMMUNITY): Payer: Self-pay | Admitting: Emergency Medicine

## 2016-09-05 DIAGNOSIS — M86651 Other chronic osteomyelitis, right thigh: Secondary | ICD-10-CM | POA: Diagnosis not present

## 2016-09-05 DIAGNOSIS — Z91048 Other nonmedicinal substance allergy status: Secondary | ICD-10-CM

## 2016-09-05 DIAGNOSIS — F4310 Post-traumatic stress disorder, unspecified: Secondary | ICD-10-CM | POA: Diagnosis present

## 2016-09-05 DIAGNOSIS — R590 Localized enlarged lymph nodes: Secondary | ICD-10-CM | POA: Diagnosis present

## 2016-09-05 DIAGNOSIS — J45909 Unspecified asthma, uncomplicated: Secondary | ICD-10-CM | POA: Diagnosis present

## 2016-09-05 DIAGNOSIS — Z86711 Personal history of pulmonary embolism: Secondary | ICD-10-CM

## 2016-09-05 DIAGNOSIS — L02415 Cutaneous abscess of right lower limb: Secondary | ICD-10-CM | POA: Diagnosis present

## 2016-09-05 DIAGNOSIS — R300 Dysuria: Secondary | ICD-10-CM | POA: Diagnosis present

## 2016-09-05 DIAGNOSIS — D571 Sickle-cell disease without crisis: Secondary | ICD-10-CM | POA: Diagnosis present

## 2016-09-05 DIAGNOSIS — M329 Systemic lupus erythematosus, unspecified: Secondary | ICD-10-CM | POA: Diagnosis present

## 2016-09-05 DIAGNOSIS — Z87440 Personal history of urinary (tract) infections: Secondary | ICD-10-CM

## 2016-09-05 DIAGNOSIS — K219 Gastro-esophageal reflux disease without esophagitis: Secondary | ICD-10-CM | POA: Diagnosis present

## 2016-09-05 DIAGNOSIS — Z888 Allergy status to other drugs, medicaments and biological substances status: Secondary | ICD-10-CM

## 2016-09-05 DIAGNOSIS — M879 Osteonecrosis, unspecified: Secondary | ICD-10-CM | POA: Diagnosis present

## 2016-09-05 DIAGNOSIS — B951 Streptococcus, group B, as the cause of diseases classified elsewhere: Secondary | ICD-10-CM | POA: Diagnosis present

## 2016-09-05 DIAGNOSIS — Z86718 Personal history of other venous thrombosis and embolism: Secondary | ICD-10-CM

## 2016-09-05 DIAGNOSIS — Z79899 Other long term (current) drug therapy: Secondary | ICD-10-CM

## 2016-09-05 DIAGNOSIS — Z8049 Family history of malignant neoplasm of other genital organs: Secondary | ICD-10-CM

## 2016-09-05 DIAGNOSIS — Z87891 Personal history of nicotine dependence: Secondary | ICD-10-CM

## 2016-09-05 DIAGNOSIS — Z8542 Personal history of malignant neoplasm of other parts of uterus: Secondary | ICD-10-CM

## 2016-09-05 DIAGNOSIS — D638 Anemia in other chronic diseases classified elsewhere: Secondary | ICD-10-CM | POA: Diagnosis present

## 2016-09-05 DIAGNOSIS — Z91040 Latex allergy status: Secondary | ICD-10-CM

## 2016-09-05 DIAGNOSIS — Z7952 Long term (current) use of systemic steroids: Secondary | ICD-10-CM

## 2016-09-05 DIAGNOSIS — L03115 Cellulitis of right lower limb: Secondary | ICD-10-CM | POA: Diagnosis present

## 2016-09-05 DIAGNOSIS — L0390 Cellulitis, unspecified: Secondary | ICD-10-CM | POA: Diagnosis not present

## 2016-09-05 DIAGNOSIS — D473 Essential (hemorrhagic) thrombocythemia: Secondary | ICD-10-CM | POA: Diagnosis not present

## 2016-09-05 DIAGNOSIS — I10 Essential (primary) hypertension: Secondary | ICD-10-CM | POA: Diagnosis present

## 2016-09-05 NOTE — ED Triage Notes (Signed)
Pt to ED via [facility] with c/o right hip pain.  Pt has hx of osteomyelitis of right hip with multi. Surgeries

## 2016-09-06 ENCOUNTER — Encounter (HOSPITAL_COMMUNITY): Payer: Self-pay | Admitting: *Deleted

## 2016-09-06 ENCOUNTER — Inpatient Hospital Stay (HOSPITAL_COMMUNITY): Payer: Medicare Other | Admitting: Anesthesiology

## 2016-09-06 ENCOUNTER — Emergency Department (HOSPITAL_COMMUNITY): Payer: Medicare Other

## 2016-09-06 ENCOUNTER — Encounter (HOSPITAL_COMMUNITY)
Admission: EM | Disposition: A | Discharge status: Home Health | Payer: Self-pay | Source: Home / Self Care | Attending: Infectious Diseases

## 2016-09-06 ENCOUNTER — Inpatient Hospital Stay (HOSPITAL_COMMUNITY)
Admission: EM | Admit: 2016-09-06 | Discharge: 2016-09-13 | DRG: 498 | Disposition: A | Discharge status: Home Health | Payer: Medicare Other | Attending: Infectious Diseases | Admitting: Infectious Diseases

## 2016-09-06 DIAGNOSIS — B951 Streptococcus, group B, as the cause of diseases classified elsewhere: Secondary | ICD-10-CM | POA: Diagnosis not present

## 2016-09-06 DIAGNOSIS — Z79899 Other long term (current) drug therapy: Secondary | ICD-10-CM

## 2016-09-06 DIAGNOSIS — L02415 Cutaneous abscess of right lower limb: Secondary | ICD-10-CM | POA: Diagnosis not present

## 2016-09-06 DIAGNOSIS — K219 Gastro-esophageal reflux disease without esophagitis: Secondary | ICD-10-CM | POA: Diagnosis present

## 2016-09-06 DIAGNOSIS — I10 Essential (primary) hypertension: Secondary | ICD-10-CM

## 2016-09-06 DIAGNOSIS — M86651 Other chronic osteomyelitis, right thigh: Secondary | ICD-10-CM | POA: Diagnosis not present

## 2016-09-06 DIAGNOSIS — R590 Localized enlarged lymph nodes: Secondary | ICD-10-CM | POA: Diagnosis not present

## 2016-09-06 DIAGNOSIS — Z8619 Personal history of other infectious and parasitic diseases: Secondary | ICD-10-CM

## 2016-09-06 DIAGNOSIS — L0390 Cellulitis, unspecified: Secondary | ICD-10-CM

## 2016-09-06 DIAGNOSIS — Z8614 Personal history of Methicillin resistant Staphylococcus aureus infection: Secondary | ICD-10-CM

## 2016-09-06 DIAGNOSIS — B9689 Other specified bacterial agents as the cause of diseases classified elsewhere: Secondary | ICD-10-CM

## 2016-09-06 DIAGNOSIS — D638 Anemia in other chronic diseases classified elsewhere: Secondary | ICD-10-CM

## 2016-09-06 DIAGNOSIS — Z7952 Long term (current) use of systemic steroids: Secondary | ICD-10-CM | POA: Diagnosis not present

## 2016-09-06 DIAGNOSIS — J45909 Unspecified asthma, uncomplicated: Secondary | ICD-10-CM

## 2016-09-06 DIAGNOSIS — M868X8 Other osteomyelitis, other site: Secondary | ICD-10-CM | POA: Diagnosis not present

## 2016-09-06 DIAGNOSIS — L03115 Cellulitis of right lower limb: Secondary | ICD-10-CM | POA: Diagnosis not present

## 2016-09-06 DIAGNOSIS — D473 Essential (hemorrhagic) thrombocythemia: Secondary | ICD-10-CM | POA: Diagnosis not present

## 2016-09-06 DIAGNOSIS — M329 Systemic lupus erythematosus, unspecified: Secondary | ICD-10-CM | POA: Diagnosis not present

## 2016-09-06 DIAGNOSIS — Z91040 Latex allergy status: Secondary | ICD-10-CM | POA: Diagnosis not present

## 2016-09-06 DIAGNOSIS — Z86718 Personal history of other venous thrombosis and embolism: Secondary | ICD-10-CM | POA: Diagnosis not present

## 2016-09-06 DIAGNOSIS — M879 Osteonecrosis, unspecified: Secondary | ICD-10-CM | POA: Diagnosis not present

## 2016-09-06 DIAGNOSIS — R109 Unspecified abdominal pain: Secondary | ICD-10-CM

## 2016-09-06 DIAGNOSIS — Z87440 Personal history of urinary (tract) infections: Secondary | ICD-10-CM

## 2016-09-06 DIAGNOSIS — M869 Osteomyelitis, unspecified: Secondary | ICD-10-CM | POA: Diagnosis present

## 2016-09-06 DIAGNOSIS — R300 Dysuria: Secondary | ICD-10-CM | POA: Diagnosis not present

## 2016-09-06 DIAGNOSIS — Z8049 Family history of malignant neoplasm of other genital organs: Secondary | ICD-10-CM | POA: Diagnosis not present

## 2016-09-06 DIAGNOSIS — Z91048 Other nonmedicinal substance allergy status: Secondary | ICD-10-CM | POA: Diagnosis not present

## 2016-09-06 DIAGNOSIS — Z888 Allergy status to other drugs, medicaments and biological substances status: Secondary | ICD-10-CM | POA: Diagnosis not present

## 2016-09-06 DIAGNOSIS — F4310 Post-traumatic stress disorder, unspecified: Secondary | ICD-10-CM | POA: Diagnosis not present

## 2016-09-06 DIAGNOSIS — Z8542 Personal history of malignant neoplasm of other parts of uterus: Secondary | ICD-10-CM | POA: Diagnosis not present

## 2016-09-06 DIAGNOSIS — Z86711 Personal history of pulmonary embolism: Secondary | ICD-10-CM | POA: Diagnosis not present

## 2016-09-06 DIAGNOSIS — D72829 Elevated white blood cell count, unspecified: Secondary | ICD-10-CM | POA: Diagnosis not present

## 2016-09-06 DIAGNOSIS — F419 Anxiety disorder, unspecified: Secondary | ICD-10-CM

## 2016-09-06 DIAGNOSIS — R262 Difficulty in walking, not elsewhere classified: Secondary | ICD-10-CM

## 2016-09-06 DIAGNOSIS — D571 Sickle-cell disease without crisis: Secondary | ICD-10-CM | POA: Diagnosis not present

## 2016-09-06 HISTORY — PX: INCISION AND DRAINAGE ABSCESS: SHX5864

## 2016-09-06 HISTORY — DX: Cellulitis, unspecified: L03.90

## 2016-09-06 LAB — BASIC METABOLIC PANEL
Anion gap: 12 (ref 5–15)
BUN: 6 mg/dL (ref 6–20)
CHLORIDE: 104 mmol/L (ref 101–111)
CO2: 26 mmol/L (ref 22–32)
CREATININE: 0.75 mg/dL (ref 0.44–1.00)
Calcium: 9.2 mg/dL (ref 8.9–10.3)
GFR calc Af Amer: >60 mL/min (ref >60)
GFR calc non Af Amer: >60 mL/min (ref >60)
Glucose, Bld: 99 mg/dL (ref 65–99)
Potassium: 3.9 mmol/L (ref 3.5–5.1)
SODIUM: 142 mmol/L (ref 135–145)

## 2016-09-06 LAB — SEDIMENTATION RATE
SED RATE: 81 mm/h — AB (ref 0–22)
Sed Rate: 92 mm/hr — ABNORMAL HIGH (ref 0–22)

## 2016-09-06 LAB — CBC WITH DIFFERENTIAL/PLATELET
BASOS ABS: 0 10*3/uL (ref 0.0–0.1)
BASOS PCT: 0 %
EOS ABS: 0.3 10*3/uL (ref 0.0–0.7)
EOS PCT: 3 %
HCT: 31.8 % — ABNORMAL LOW (ref 36.0–46.0)
HEMOGLOBIN: 9.6 g/dL — AB (ref 12.0–15.0)
Lymphocytes Relative: 33 %
Lymphs Abs: 3.7 10*3/uL (ref 0.7–4.0)
MCH: 22.2 pg — ABNORMAL LOW (ref 26.0–34.0)
MCHC: 30.2 g/dL (ref 30.0–36.0)
MCV: 73.6 fL — ABNORMAL LOW (ref 78.0–100.0)
Monocytes Absolute: 0.6 10*3/uL (ref 0.1–1.0)
Monocytes Relative: 5 %
NEUTROS PCT: 59 %
Neutro Abs: 6.5 10*3/uL (ref 1.7–7.7)
PLATELETS: 450 10*3/uL — AB (ref 150–400)
RBC: 4.32 MIL/uL (ref 3.87–5.11)
RDW: 18.4 % — ABNORMAL HIGH (ref 11.5–15.5)
WBC: 11 10*3/uL — ABNORMAL HIGH (ref 4.0–10.5)

## 2016-09-06 LAB — PREPARE RBC (CROSSMATCH)

## 2016-09-06 LAB — URINALYSIS, ROUTINE W REFLEX MICROSCOPIC
BILIRUBIN URINE: NEGATIVE
GLUCOSE, UA: NEGATIVE mg/dL
HGB URINE DIPSTICK: NEGATIVE
Ketones, ur: NEGATIVE mg/dL
Leukocytes, UA: NEGATIVE
Nitrite: NEGATIVE
Protein, ur: NEGATIVE mg/dL
SPECIFIC GRAVITY, URINE: 1.017 (ref 1.005–1.030)
pH: 7 (ref 5.0–8.0)

## 2016-09-06 LAB — C-REACTIVE PROTEIN
CRP: 12.2 mg/dL — AB (ref <1.0)
CRP: 9.2 mg/dL — ABNORMAL HIGH (ref <1.0)

## 2016-09-06 LAB — I-STAT CG4 LACTIC ACID, ED: Lactic Acid, Venous: 1.43 mmol/L (ref 0.5–1.9)

## 2016-09-06 LAB — ABO/RH: ABO/RH(D): A POS

## 2016-09-06 LAB — MRSA PCR SCREENING: MRSA BY PCR: NEGATIVE

## 2016-09-06 SURGERY — INCISION AND DRAINAGE, ABSCESS
Anesthesia: General | Site: Hip | Laterality: Right

## 2016-09-06 MED ORDER — DULOXETINE HCL 30 MG PO CPEP
30.0000 mg | ORAL_CAPSULE | Freq: Every day | ORAL | Status: DC
  Administered 2016-09-06 – 2016-09-12 (×7): 30 mg via ORAL
  Filled 2016-09-06 (×8): qty 1

## 2016-09-06 MED ORDER — HYDROMORPHONE HCL 1 MG/ML IJ SOLN
INTRAMUSCULAR | Status: AC
  Filled 2016-09-06: qty 1

## 2016-09-06 MED ORDER — PIPERACILLIN-TAZOBACTAM 3.375 G IVPB: 3.3750 g | Freq: Three times a day (TID) | INTRAVENOUS | Status: DC

## 2016-09-06 MED ORDER — VANCOMYCIN HCL IN DEXTROSE 1-5 GM/200ML-% IV SOLN
1000.0000 mg | Freq: Two times a day (BID) | INTRAVENOUS | Status: DC
  Filled 2016-09-06: qty 200

## 2016-09-06 MED ORDER — GENTAMICIN SULFATE 40 MG/ML IJ SOLN
INTRAMUSCULAR | Status: DC | PRN
  Administered 2016-09-06: 720 mg via INTRAMUSCULAR

## 2016-09-06 MED ORDER — LIDOCAINE HCL (CARDIAC) 20 MG/ML IV SOLN
INTRAVENOUS | Status: DC | PRN
  Administered 2016-09-06: 80 mg via INTRAVENOUS

## 2016-09-06 MED ORDER — VANCOMYCIN HCL IN DEXTROSE 1-5 GM/200ML-% IV SOLN
1000.0000 mg | Freq: Two times a day (BID) | INTRAVENOUS | Status: DC
  Administered 2016-09-07 – 2016-09-09 (×5): 1000 mg via INTRAVENOUS
  Filled 2016-09-06 (×7): qty 200

## 2016-09-06 MED ORDER — ONDANSETRON HCL 4 MG/2ML IJ SOLN
4.0000 mg | Freq: Once | INTRAMUSCULAR | Status: AC
  Administered 2016-09-06: 4 mg via INTRAVENOUS
  Filled 2016-09-06: qty 2

## 2016-09-06 MED ORDER — ACETAMINOPHEN 650 MG RE SUPP: 650.0000 mg | Freq: Four times a day (QID) | RECTAL | Status: DC | PRN

## 2016-09-06 MED ORDER — HYDROMORPHONE HCL 1 MG/ML IJ SOLN
0.2500 mg | INTRAMUSCULAR | Status: DC | PRN
  Administered 2016-09-06 (×4): 0.5 mg via INTRAVENOUS

## 2016-09-06 MED ORDER — PIPERACILLIN-TAZOBACTAM 3.375 G IVPB 30 MIN
3.3750 g | INTRAVENOUS | Status: AC
  Administered 2016-09-06: 3.375 g via INTRAVENOUS
  Filled 2016-09-06: qty 50

## 2016-09-06 MED ORDER — GENTAMICIN SULFATE 40 MG/ML IJ SOLN
INTRAMUSCULAR | Status: AC
  Filled 2016-09-06: qty 18

## 2016-09-06 MED ORDER — ONDANSETRON HCL 4 MG/2ML IJ SOLN
4.0000 mg | Freq: Four times a day (QID) | INTRAMUSCULAR | Status: DC | PRN
  Administered 2016-09-10 – 2016-09-12 (×5): 4 mg via INTRAVENOUS
  Filled 2016-09-06 (×6): qty 2

## 2016-09-06 MED ORDER — NAPROXEN 250 MG PO TABS: 500.0000 mg | ORAL_TABLET | Freq: Two times a day (BID) | ORAL | Status: DC

## 2016-09-06 MED ORDER — GABAPENTIN 400 MG PO CAPS
800.0000 mg | ORAL_CAPSULE | Freq: Three times a day (TID) | ORAL | Status: DC
  Administered 2016-09-06 – 2016-09-13 (×21): 800 mg via ORAL
  Filled 2016-09-06 (×21): qty 2

## 2016-09-06 MED ORDER — CEFTRIAXONE SODIUM 2 G IJ SOLR
2.0000 g | INTRAMUSCULAR | Status: DC
  Administered 2016-09-06 – 2016-09-13 (×8): 2 g via INTRAVENOUS
  Filled 2016-09-06 (×9): qty 2

## 2016-09-06 MED ORDER — ROCURONIUM BROMIDE 100 MG/10ML IV SOLN
INTRAVENOUS | Status: DC | PRN
  Administered 2016-09-06: 50 mg via INTRAVENOUS

## 2016-09-06 MED ORDER — HYDROXYCHLOROQUINE SULFATE 200 MG PO TABS
200.0000 mg | ORAL_TABLET | Freq: Two times a day (BID) | ORAL | Status: DC
  Administered 2016-09-06 – 2016-09-13 (×14): 200 mg via ORAL
  Filled 2016-09-06 (×14): qty 1

## 2016-09-06 MED ORDER — FENTANYL CITRATE (PF) 100 MCG/2ML IJ SOLN
INTRAMUSCULAR | Status: AC
  Filled 2016-09-06: qty 4

## 2016-09-06 MED ORDER — SUGAMMADEX SODIUM 200 MG/2ML IV SOLN
INTRAVENOUS | Status: DC | PRN
Start: 2016-09-06 — End: 2016-09-06
  Administered 2016-09-06: 200 mg via INTRAVENOUS

## 2016-09-06 MED ORDER — CYCLOBENZAPRINE HCL 10 MG PO TABS
10.0000 mg | ORAL_TABLET | Freq: Every day | ORAL | Status: DC | PRN
  Administered 2016-09-12 – 2016-09-13 (×2): 10 mg via ORAL
  Filled 2016-09-06 (×2): qty 1

## 2016-09-06 MED ORDER — PREDNISONE 5 MG PO TABS
5.0000 mg | ORAL_TABLET | Freq: Four times a day (QID) | ORAL | Status: DC
  Administered 2016-09-06 – 2016-09-13 (×27): 5 mg via ORAL
  Filled 2016-09-06 (×30): qty 1

## 2016-09-06 MED ORDER — METOCLOPRAMIDE HCL 10 MG PO TABS
5.0000 mg | ORAL_TABLET | Freq: Three times a day (TID) | ORAL | Status: DC | PRN
  Administered 2016-09-11: 10 mg via ORAL
  Filled 2016-09-06: qty 1

## 2016-09-06 MED ORDER — FERROUS SULFATE 325 (65 FE) MG PO TABS
325.0000 mg | ORAL_TABLET | Freq: Every day | ORAL | Status: DC
  Administered 2016-09-07 – 2016-09-13 (×7): 325 mg via ORAL
  Filled 2016-09-06 (×7): qty 1

## 2016-09-06 MED ORDER — ACETAMINOPHEN 325 MG PO TABS: 650.0000 mg | ORAL_TABLET | Freq: Four times a day (QID) | ORAL | Status: DC | PRN

## 2016-09-06 MED ORDER — SODIUM CHLORIDE 0.9 % IV BOLUS (SEPSIS)
1000.0000 mL | Freq: Once | INTRAVENOUS | Status: AC
  Administered 2016-09-06: 1000 mL via INTRAVENOUS

## 2016-09-06 MED ORDER — MIDAZOLAM HCL 2 MG/2ML IJ SOLN
INTRAMUSCULAR | Status: AC
  Filled 2016-09-06: qty 2

## 2016-09-06 MED ORDER — FENTANYL CITRATE (PF) 100 MCG/2ML IJ SOLN
INTRAMUSCULAR | Status: DC | PRN
  Administered 2016-09-06: 50 ug via INTRAVENOUS
  Administered 2016-09-06: 100 ug via INTRAVENOUS
  Administered 2016-09-06: 50 ug via INTRAVENOUS
  Administered 2016-09-06: 100 ug via INTRAVENOUS
  Administered 2016-09-06 (×2): 50 ug via INTRAVENOUS

## 2016-09-06 MED ORDER — ACETAMINOPHEN 500 MG PO TABS: 1000.0000 mg | ORAL_TABLET | Freq: Four times a day (QID) | ORAL | Status: DC | PRN

## 2016-09-06 MED ORDER — ONDANSETRON HCL 4 MG/2ML IJ SOLN
INTRAMUSCULAR | Status: DC | PRN
  Administered 2016-09-06: 4 mg via INTRAVENOUS

## 2016-09-06 MED ORDER — VANCOMYCIN HCL IN DEXTROSE 1-5 GM/200ML-% IV SOLN
INTRAVENOUS | Status: AC
  Filled 2016-09-06: qty 200

## 2016-09-06 MED ORDER — SODIUM CHLORIDE 0.9 % IV SOLN
INTRAVENOUS | Status: DC
  Administered 2016-09-06: 05:00:00 via INTRAVENOUS
  Administered 2016-09-06: 125 mL via INTRAVENOUS
  Administered 2016-09-06 – 2016-09-07 (×2): via INTRAVENOUS

## 2016-09-06 MED ORDER — HYDROMORPHONE HCL 2 MG/ML IJ SOLN
1.0000 mg | Freq: Once | INTRAMUSCULAR | Status: AC
  Administered 2016-09-06: 1 mg via INTRAVENOUS
  Filled 2016-09-06: qty 1

## 2016-09-06 MED ORDER — VANCOMYCIN HCL 10 G IV SOLR
2000.0000 mg | INTRAVENOUS | Status: AC
  Administered 2016-09-06: 2000 mg via INTRAVENOUS
  Filled 2016-09-06: qty 2000

## 2016-09-06 MED ORDER — SODIUM CHLORIDE 0.9 % IR SOLN
Status: DC | PRN
  Administered 2016-09-06 (×2): 1000 mL
  Administered 2016-09-06: 3000 mL

## 2016-09-06 MED ORDER — PANTOPRAZOLE SODIUM 40 MG PO TBEC
40.0000 mg | DELAYED_RELEASE_TABLET | Freq: Every day | ORAL | Status: DC
  Administered 2016-09-06 – 2016-09-13 (×8): 40 mg via ORAL
  Filled 2016-09-06 (×5): qty 1
  Filled 2016-09-06: qty 2
  Filled 2016-09-06 (×3): qty 1

## 2016-09-06 MED ORDER — VITAMIN B-12 100 MCG PO TABS
500.0000 ug | ORAL_TABLET | Freq: Every day | ORAL | Status: DC
  Administered 2016-09-06 – 2016-09-13 (×8): 500 ug via ORAL
  Filled 2016-09-06 (×9): qty 5

## 2016-09-06 MED ORDER — MIDAZOLAM HCL 5 MG/5ML IJ SOLN
INTRAMUSCULAR | Status: DC | PRN
  Administered 2016-09-06: 2 mg via INTRAVENOUS

## 2016-09-06 MED ORDER — VANCOMYCIN HCL 1000 MG IV SOLR
INTRAVENOUS | Status: AC
  Filled 2016-09-06: qty 3000

## 2016-09-06 MED ORDER — ONDANSETRON HCL 4 MG PO TABS: 4.0000 mg | ORAL_TABLET | Freq: Four times a day (QID) | ORAL | Status: DC | PRN

## 2016-09-06 MED ORDER — TRAMADOL HCL 50 MG PO TABS
50.0000 mg | ORAL_TABLET | Freq: Four times a day (QID) | ORAL | Status: DC | PRN
  Administered 2016-09-06 – 2016-09-08 (×3): 50 mg via ORAL
  Filled 2016-09-06 (×3): qty 1

## 2016-09-06 MED ORDER — ZOLPIDEM TARTRATE 5 MG PO TABS
5.0000 mg | ORAL_TABLET | Freq: Every evening | ORAL | Status: DC | PRN
  Administered 2016-09-06 – 2016-09-12 (×7): 5 mg via ORAL
  Filled 2016-09-06 (×7): qty 1

## 2016-09-06 MED ORDER — METOCLOPRAMIDE HCL 5 MG/ML IJ SOLN
5.0000 mg | Freq: Three times a day (TID) | INTRAMUSCULAR | Status: DC | PRN
  Administered 2016-09-11: 10 mg via INTRAVENOUS
  Filled 2016-09-06 (×2): qty 2

## 2016-09-06 MED ORDER — HYDROMORPHONE HCL 2 MG/ML IJ SOLN
1.0000 mg | INTRAMUSCULAR | Status: DC | PRN
  Administered 2016-09-06 – 2016-09-08 (×11): 1 mg via INTRAVENOUS
  Filled 2016-09-06 (×12): qty 1

## 2016-09-06 MED ORDER — DICYCLOMINE HCL 10 MG PO CAPS: 20.0000 mg | ORAL_CAPSULE | Freq: Two times a day (BID) | ORAL | Status: DC | PRN

## 2016-09-06 MED ORDER — VANCOMYCIN HCL 1000 MG IV SOLR
INTRAVENOUS | Status: DC | PRN
  Administered 2016-09-06: 3 g

## 2016-09-06 MED ORDER — IOPAMIDOL (ISOVUE-300) INJECTION 61%
INTRAVENOUS | Status: AC
  Administered 2016-09-06: 100 mL
  Filled 2016-09-06: qty 100

## 2016-09-06 MED ORDER — SENNOSIDES-DOCUSATE SODIUM 8.6-50 MG PO TABS: 1.0000 | ORAL_TABLET | Freq: Every evening | ORAL | Status: DC | PRN

## 2016-09-06 MED ORDER — MONTELUKAST SODIUM 10 MG PO TABS
10.0000 mg | ORAL_TABLET | Freq: Every day | ORAL | Status: DC
  Administered 2016-09-06 – 2016-09-13 (×8): 10 mg via ORAL
  Filled 2016-09-06 (×8): qty 1

## 2016-09-06 MED ORDER — VANCOMYCIN HCL 1000 MG IV SOLR
INTRAVENOUS | Status: DC | PRN
  Administered 2016-09-06: 1000 mg via INTRAVENOUS

## 2016-09-06 MED ORDER — LACTATED RINGERS IV SOLN
INTRAVENOUS | Status: DC | PRN
  Administered 2016-09-06: 18:00:00 via INTRAVENOUS

## 2016-09-06 MED ORDER — PROPOFOL 10 MG/ML IV BOLUS
INTRAVENOUS | Status: DC | PRN
  Administered 2016-09-06: 150 mg via INTRAVENOUS
  Administered 2016-09-06: 50 mg via INTRAVENOUS

## 2016-09-06 MED ORDER — SUGAMMADEX SODIUM 200 MG/2ML IV SOLN: INTRAVENOUS | Status: DC | PRN

## 2016-09-06 MED ORDER — FLUTICASONE FUROATE-VILANTEROL 200-25 MCG/INH IN AEPB
1.0000 | INHALATION_SPRAY | Freq: Every day | RESPIRATORY_TRACT | Status: DC
  Administered 2016-09-07 – 2016-09-11 (×5): 1 via RESPIRATORY_TRACT
  Filled 2016-09-06: qty 28

## 2016-09-06 MED ORDER — ALBUTEROL SULFATE (2.5 MG/3ML) 0.083% IN NEBU: 2.5000 mg | INHALATION_SOLUTION | RESPIRATORY_TRACT | Status: DC | PRN

## 2016-09-06 SURGICAL SUPPLY — 57 items
BLADE SAW SAG 73X25 THK (BLADE) ×1
BLADE SAW SGTL 73X25 THK (BLADE) ×1 IMPLANT
BLADE SURG 10 STRL SS (BLADE) ×2 IMPLANT
BLADE SURG 15 STRL LF DISP TIS (BLADE) ×1 IMPLANT
BLADE SURG 15 STRL SS (BLADE) ×1
BRUSH FEMORAL CANAL (MISCELLANEOUS) ×2 IMPLANT
CANISTER WOUND CARE 500ML ATS (WOUND CARE) ×2 IMPLANT
COVER SURGICAL LIGHT HANDLE (MISCELLANEOUS) ×2 IMPLANT
DRAPE IMP U-DRAPE 54X76 (DRAPES) ×2 IMPLANT
DRAPE ORTHO SPLIT 77X108 STRL (DRAPES) ×3
DRAPE SURG ORHT 6 SPLT 77X108 (DRAPES) ×3 IMPLANT
DRAPE U-SHAPE 47X51 STRL (DRAPES) ×2 IMPLANT
DRSG ADAPTIC 3X8 NADH LF (GAUZE/BANDAGES/DRESSINGS) ×2 IMPLANT
DRSG PAD ABDOMINAL 8X10 ST (GAUZE/BANDAGES/DRESSINGS) ×2 IMPLANT
DURAPREP 26ML APPLICATOR (WOUND CARE) ×2 IMPLANT
ELECT CAUTERY BLADE 6.4 (BLADE) IMPLANT
ELECT REM PT RETURN 9FT ADLT (ELECTROSURGICAL)
ELECTRODE REM PT RTRN 9FT ADLT (ELECTROSURGICAL) IMPLANT
FACESHIELD LNG OPTICON STERILE (SAFETY) ×2 IMPLANT
GAUZE SPONGE 4X4 12PLY STRL (GAUZE/BANDAGES/DRESSINGS) ×2 IMPLANT
GLOVE BIOGEL PI IND STRL 6.5 (GLOVE) ×1 IMPLANT
GLOVE BIOGEL PI IND STRL 7.0 (GLOVE) ×1 IMPLANT
GLOVE BIOGEL PI IND STRL 7.5 (GLOVE) ×1 IMPLANT
GLOVE BIOGEL PI IND STRL 8 (GLOVE) ×1 IMPLANT
GLOVE BIOGEL PI INDICATOR 6.5 (GLOVE) ×1
GLOVE BIOGEL PI INDICATOR 7.0 (GLOVE) ×1
GLOVE BIOGEL PI INDICATOR 7.5 (GLOVE) ×1
GLOVE BIOGEL PI INDICATOR 8 (GLOVE) ×1
GLOVE SURG ORTHO 8.0 STRL STRW (GLOVE) ×2 IMPLANT
GOWN STRL REUS W/ TWL LRG LVL3 (GOWN DISPOSABLE) ×1 IMPLANT
GOWN STRL REUS W/TWL LRG LVL3 (GOWN DISPOSABLE) ×1
HANDPIECE INTERPULSE COAX TIP (DISPOSABLE)
KIT BASIN OR (CUSTOM PROCEDURE TRAY) ×2 IMPLANT
KIT ROOM TURNOVER OR (KITS) ×2 IMPLANT
KIT STIMULAN RAPID CURE  10CC (Orthopedic Implant) ×3 IMPLANT
KIT STIMULAN RAPID CURE 10CC (Orthopedic Implant) ×3 IMPLANT
MANIFOLD NEPTUNE II (INSTRUMENTS) ×2 IMPLANT
NS IRRIG 1000ML POUR BTL (IV SOLUTION) ×2 IMPLANT
PACK TOTAL JOINT (CUSTOM PROCEDURE TRAY) ×2 IMPLANT
PACK UNIVERSAL I (CUSTOM PROCEDURE TRAY) ×2 IMPLANT
PAD ARMBOARD 7.5X6 YLW CONV (MISCELLANEOUS) ×4 IMPLANT
PREVENA INCISION MGT 90 150 (MISCELLANEOUS) ×2 IMPLANT
SET HNDPC FAN SPRY TIP SCT (DISPOSABLE) IMPLANT
SPONGE LAP 18X18 X RAY DECT (DISPOSABLE) ×4 IMPLANT
SUT ETHILON 2 0 PSLX (SUTURE) ×4 IMPLANT
SUT VIC AB 1 CTX 27 (SUTURE) ×4 IMPLANT
SUT VIC AB 2-0 CT1 27 (SUTURE) ×2
SUT VIC AB 2-0 CT1 TAPERPNT 27 (SUTURE) ×2 IMPLANT
SWAB COLLECTION DEVICE MRSA (MISCELLANEOUS) ×2 IMPLANT
SWAB CULTURE LIQ STUART DBL (MISCELLANEOUS) ×2 IMPLANT
TOWEL OR 17X24 6PK STRL BLUE (TOWEL DISPOSABLE) ×2 IMPLANT
TOWEL OR 17X26 10 PK STRL BLUE (TOWEL DISPOSABLE) ×2 IMPLANT
TRAY FOLEY BAG SILVER LF 16FR (SET/KITS/TRAYS/PACK) ×2 IMPLANT
TUBE ANAEROBIC SPECIMEN COL (MISCELLANEOUS) IMPLANT
UNDERPAD 30X30 (UNDERPADS AND DIAPERS) ×2 IMPLANT
WATER STERILE IRR 1000ML POUR (IV SOLUTION) ×2 IMPLANT
YANKAUER SUCT BULB TIP NO VENT (SUCTIONS) ×2 IMPLANT

## 2016-09-06 NOTE — Anesthesia Procedure Notes (Signed)
Procedure Name: Intubation Date/Time: 09/06/2016 6:01 PM Performed by: Salli Quarry Irish Piech Pre-anesthesia Checklist: Patient identified, Emergency Drugs available, Suction available and Patient being monitored Patient Re-evaluated:Patient Re-evaluated prior to inductionOxygen Delivery Method: Circle System Utilized Preoxygenation: Pre-oxygenation with 100% oxygen Intubation Type: IV induction Ventilation: Mask ventilation without difficulty Laryngoscope Size: Mac and 3 Grade View: Grade II Tube type: Oral Tube size: 7.5 mm Number of attempts: 1 Airway Equipment and Method: Stylet Placement Confirmation: ETT inserted through vocal cords under direct vision,  positive ETCO2 and breath sounds checked- equal and bilateral Secured at: 23 cm Tube secured with: Tape Dental Injury: Teeth and Oropharynx as per pre-operative assessment
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
## 2016-09-06 NOTE — Transfer of Care (Signed)
Immediate Anesthesia Transfer of Care Note  Patient: Katherine Banks  Procedure(s) Performed: Procedure(s): INCISION AND DRAINAGE RIGHT HIP (Right)  Patient Location: PACU  Anesthesia Type:General  Level of Consciousness: awake, alert  and oriented  Airway & Oxygen Therapy: Patient Spontanous Breathing  Post-op Assessment: Report given to RN and Post -op Vital signs reviewed and stable  Post vital signs: Reviewed and stable  Last Vitals:  Vitals:   09/06/16 1000 09/06/16 1533  BP: (!) 151/81 (!) 153/83  Pulse: 93 83  Resp: 18 18  Temp: 37 C 36.6 C    Last Pain:  Vitals:   09/06/16 1533  TempSrc: Oral  PainSc:       Patients Stated Pain Goal: 2 ([id number])  Complications: No apparent anesthesia complications

## 2016-09-06 NOTE — ED Notes (Signed)
IV team at bedside again. Unable to use port at this time.
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
## 2016-09-06 NOTE — ED Notes (Signed)
Patient transported to CT 

## 2016-09-06 NOTE — ED Notes (Signed)
Dr. Ward at bedside at this time.  

## 2016-09-06 NOTE — Progress Notes (Signed)
Patient going to OR, surgical consent and preop check list done. Report called to [facility].  Cauy Melody, Tivis Ringer, RN

## 2016-09-06 NOTE — Progress Notes (Signed)
Pharmacy Antibiotic Note  Katherine Banks is a 50 y.o. female admitted on 09/06/2016 with hip pain.  Pt with h/o recurrent infections of the R hip requiring hardware removal and debridement. Noted pt recently finished Rifampin about one mos ago. Wad followed by ID clinic and doxy changed to Keflex for Group B Strep.  Pharmacy initially consulted for Vancomycin and Zosyn dosing for sepsis, and 1 dose of each was given. Antibiotics later to Vancomycin and Rocephin for osteomyelitis coverage, then held before additional doses given.    Now s/p I&D in OR.  Hip wound culture/deep soft tissue sent. No organisms seen on gram stain. Antibiotics to resume.     Vanc 2gm IV x 1 given ~6am today in ED, then 1gm given in OR ~6pm.    Zosyn 3.375 gm IV x 1 given ~5am today in ED.  Plan: Ceftriaxone 2 gm IV q24hrs. Vancomycin 1 gm IV every 12 hours.  Goal trough 15-20 mcg/mL.  Vancomycin trough level at steady state. Will follow renal function, culture data, progress.    Height: 5\' 6"  (167.6 cm) Weight: 249 lb (112.9 kg) IBW/kg (Calculated) : 59.3  Temp (24hrs), Avg:98 F (36.7 C), Min:97.3 F (36.3 C), Max:98.6 F (37 C)   Recent Labs Lab 09/06/16 0223 09/06/16 0247  WBC 11.0*  --   CREATININE 0.75  --   LATICACIDVEN  --  1.43    Estimated Creatinine Clearance: 104.8 mL/min (by C-G formula based on SCr of 0.75 mg/dL).    Allergies  Allergen Reactions  . Compazine [Prochlorperazine Edisylate] Anaphylaxis, Swelling and Rash  . Morphine And Related Anaphylaxis    Projectile vomiting and coughing also  . Phenergan [Promethazine Hcl] Anaphylaxis  . Toradol [Ketorolac Tromethamine] Anaphylaxis, Hives and Rash  . Exforge Hct [Amlodipine-Valsartan-Hctz] Nausea And Vomiting    Thoughts were not settled, neurological changes also  . Influenza Vaccines Itching, Nausea And Vomiting and Rash    Swollen lips   . Latex Rash  . Tape Itching and Rash    Antimicrobials this admission:  Zosyn x 1  on 11/7  Vancomycin 11/7>>  Ceftriaxone 11/7>>  Dose adjustments this admission:  n/a  Microbiology results:  11/7 Blood x 2 -  11/7 hip wound/deep soft tissue - no organisms on gram stain - cx pending  11/7 MRSA screen - negative  Thank you for allowing pharmacy to be a part of this patient's care.  Arty Baumgartner, [place] Pager: [phone] 09/06/2016 10:25 PM

## 2016-09-06 NOTE — ED Notes (Signed)
Pt's port accessed. Resistance met when flushing with saline; pt reports being able to taste flush but does not usually have resistance. IV consulted to verify placement. Per Dr.Ward, second set of cultures to be drawn from port

## 2016-09-06 NOTE — Progress Notes (Signed)
Pharmacy Antibiotic Note  Serine Beiser is a 50 y.o. female admitted on 09/06/2016 with hip pain. Pt with h/o recurrent infections of the R hip requiring hardware removal and debridement. Noted pt recently finished Rifampin about one mos ago. Wad followed by ID clinic and doxy changed to Keflex for Group B strep. Pharmacy initially consulted for Vancomycin and Zosyn dosing for sepsis.  Now to change abx to Vancomycin and Rocephin for osteo coverage.  Zosyn 3.375gm IV given ~0445 and Vanc 2gm IV given ~0600 in ED  Plan: Add Rocephin 2gm IV q24h Continue Vancomycin 1gm IV q12h Will f/u micro data, renal function, and pt's clinical condition Vanc trough at Css   Height: 5\' 6"  (167.6 cm) Weight: 249 lb (112.9 kg) IBW/kg (Calculated) : 59.3  Temp (24hrs), Avg:98.4 F (36.9 C), Min:97.9 F (36.6 C), Max:98.6 F (37 C)   Recent Labs Lab 09/06/16 0223 09/06/16 0247  WBC 11.0*  --   CREATININE 0.75  --   LATICACIDVEN  --  1.43    Estimated Creatinine Clearance: 104.8 mL/min (by C-G formula based on SCr of 0.75 mg/dL).    Allergies  Allergen Reactions  . Compazine [Prochlorperazine Edisylate] Anaphylaxis, Swelling and Rash  . Morphine And Related Anaphylaxis    Projectile vomiting and coughing also  . Phenergan [Promethazine Hcl] Anaphylaxis  . Toradol [Ketorolac Tromethamine] Anaphylaxis, Hives and Rash  . Exforge Hct [Amlodipine-Valsartan-Hctz] Nausea And Vomiting    Thoughts were not settled, neurological changes also  . Influenza Vaccines Itching, Nausea And Vomiting and Rash    Swollen lips   . Latex Rash  . Tape Itching and Rash    Antimicrobials this admission: 11/7 Vanc >>  11/7 Zosyn >> 11/7 11/7 Rocephin >>  Dose adjustments this admission: n/a  Microbiology results: 11/7 BCx:   Thank you for allowing pharmacy to be a part of this patient's care.  [employer], Pharm.D., BCPS Clinical Pharmacist Pager [phone] 09/06/2016 11:45 AM

## 2016-09-06 NOTE — Progress Notes (Signed)
Pharmacy Antibiotic Note  Katherine Banks is a 50 y.o. female admitted on 09/06/2016 with hip pain. Pt with h/o recurrent infections of the R hip requiring hardware removal and debridement. Noted pt recently finished Rifampin about one mos ago. Wad followed by ID clinic and doxy changed to Keflex for Group B strep. Pharmacy has been consulted for Vancomycin and Zosyn dosing for sepsis.  Zosyn 3.375gm IV given ~0445 and Vanc 2gm IV given ~0600 in ED  Plan: Zosyn 3.375gm IV q8h - doses over 4 hours Vancomycin 1gm IV q12h Will f/u micro data, renal function, and pt's clinical condition Vanc trough at Css   Height: 5\' 6"  (167.6 cm) Weight: 245 lb (111.1 kg) IBW/kg (Calculated) : 59.3  Temp (24hrs), Avg:98.5 F (36.9 C), Min:98.3 F (36.8 C), Max:98.6 F (37 C)   Recent Labs Lab 09/06/16 0223 09/06/16 0247  WBC 11.0*  --   CREATININE 0.75  --   LATICACIDVEN  --  1.43    Estimated Creatinine Clearance: 103.9 mL/min (by C-G formula based on SCr of 0.75 mg/dL).    Allergies  Allergen Reactions  . Compazine [Prochlorperazine Edisylate] Anaphylaxis, Swelling and Rash  . Morphine And Related Anaphylaxis    Projectile vomiting and coughing also  . Phenergan [Promethazine Hcl] Anaphylaxis  . Toradol [Ketorolac Tromethamine] Anaphylaxis, Hives and Rash  . Exforge Hct [Amlodipine-Valsartan-Hctz] Nausea And Vomiting    Thoughts were not settled, neurological changes also  . Influenza Vaccines Itching, Nausea And Vomiting and Rash    Swollen lips   . Latex Rash  . Tape Itching and Rash    Antimicrobials this admission: 11/7 Vanc >>  11/7 Zosyn >>   Dose adjustments this admission: n/a  Microbiology results: 11/7 BCx:   Thank you for allowing pharmacy to be a part of this patient's care.  Sherlon Handing, PharmD, BCPS Clinical pharmacist, pager [phone] 09/06/2016 6:21 AM

## 2016-09-06 NOTE — ED Notes (Addendum)
Attempted to call report

## 2016-09-06 NOTE — ED Notes (Signed)
IV Team staff members at bedside at this time.
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
## 2016-09-06 NOTE — H&P (Addendum)
  Date: 09/06/2016  Patient name: Katherine Banks  Medical record number: [id number]  Date of birth: [birth year]   I have seen and evaluated Katherine Banks and discussed their care with the Residency Team.  50 yo F with hx of SLE and R THR 2012 and multiple subsequent surgeries (ncluding girdlestone). Prev Cx from R hip abscess grew MRSA (11-23-15). She was seen in ID May 2017 with drainage from her R hip for 1 week. She was adm to hospital and was found to have a fluid collection. This was aspirated and was no growth. She was continued on her chronic doxy.  She was again hospitalized 8-31 after having falls at home as well as worsening fatgue. She has also noted pain with wt bearing. She was on chronic doxy/rifampin at that time.  She was found to have osteomyelitis and fluid collection of R femur, and L femur AVN and fluid in her gluteus muscle on her MRI. She had an IR aspirate which showed Group B strep.  She was d/c home on 9-8 on doxy/rifampin, changed to keflex rifampin on 9-11 when Cx data finalized.  She has not had oupt ID f/u. She has had Ortho f/u. She states she is waiting for referral to [facility] as well.  States she has been on her anbx continuously since September.   She returns today with 7 days of worsening pain in her R hip and d/c from her wound over the last 2-3 days. She has had no change in her ability to ambulate. She denies f/c.  In Ed she was afebrile with WBC of 11.0. Her CT hip showed:  Previous resection or resorption of the right femoral head and neck with callus formation around the proximal right femur consistent with postoperative change or chronic osteomyelitis. Loculated fluid collections around the right hip with a large collection extending laterally towards the skin surface consistent with abscesses. Soft tissue stranding and skin thickening consistent with cellulitis. Probable sinus tracts to the skin surface anteriorly and laterally. Right groin and right  pelvic lymphadenopathy is likely reactive. Incidental note of changes of left hip avascular necrosis.  PMHx, Fam Hx, and/or Soc Hx : see h/o note as well  Vitals:   09/06/16 0900 09/06/16 1000  BP: 147/85 (!) 151/81  Pulse: 94 93  Resp:  18  Temp:  98.6 F (37 C)   Eyes- EOMI, Perrl Mouth- without lesion Neck- nontender, no LAN Chest- cta CV- rrr abd- BS+, soft nontender Extr- golf ball sized lesion on R hip wound. There is fluctuance and tenderness here. There is heat. There is no d/c from wound nor opening in skin.   Assessment and Plan: I have seen and evaluated the patient as outlined above. I agree with the formulated Assessment and Plan as detailed in the residents' note, with the following changes:   1. Osteomyelitis, Abscess R hip Will hold anbx Appreciate Ortho f/u Possible I & D in AM Check ESR and CRP  Campbell Riches, MD 11/7/201712:31 PM

## 2016-09-06 NOTE — ED Notes (Signed)
Phlebotomy at bedside.

## 2016-09-06 NOTE — ED Notes (Signed)
Patient has a port that needs to be accessed.

## 2016-09-06 NOTE — Brief Op Note (Signed)
09/06/2016  7:47 PM  PATIENT:  Katherine Banks  50 y.o. female  PRE-OPERATIVE DIAGNOSIS:  Right hip wound  POST-OPERATIVE DIAGNOSIS:  Right hip wound  PROCEDURE:  Procedure(s): INCISION AND DRAINAGE RIGHT HIP  SURGEON:  Surgeon(s): Meredith Pel, MD  ASSISTANT: Benjiman Core pa  ANESTHESIA:   general  EBL: 330 ml    Total I/O In: 331 [Blood:331] Out: -   BLOOD ADMINISTERED: none  DRAINS: incisional wound vac   LOCAL MEDICATIONS USED:  none  SPECIMEN:  Cultures x 1  COUNTS:  YES  TOURNIQUET:  * No tourniquets in log *  DICTATION: .Other Dictation: Dictation Number [id number]  PLAN OF CARE: Admit to inpatient   PATIENT DISPOSITION:  PACU - hemodynamically stable

## 2016-09-06 NOTE — Anesthesia Postprocedure Evaluation (Signed)
Anesthesia Post Note  Patient: Katherine Banks  Procedure(s) Performed: Procedure(s) (LRB): INCISION AND DRAINAGE RIGHT HIP (Right)  Patient location during evaluation: PACU Anesthesia Type: General Level of consciousness: awake Pain management: pain level controlled Vital Signs Assessment: post-procedure vital signs reviewed and stable Respiratory status: spontaneous breathing Cardiovascular status: stable Anesthetic complications: no    Last Vitals:  Vitals:   09/06/16 2005 09/06/16 2020  BP: 136/74 138/63  Pulse: 100 94  Resp: 14 14  Temp:      Last Pain:  Vitals:   09/06/16 2020  TempSrc:   PainSc: 5                  EDWARDS,Kortland Nichols

## 2016-09-07 ENCOUNTER — Encounter (HOSPITAL_COMMUNITY): Payer: Self-pay | Admitting: Orthopedic Surgery

## 2016-09-07 LAB — TYPE AND SCREEN
ABO/RH(D): A POS
ANTIBODY SCREEN: NEGATIVE
UNIT DIVISION: 0
UNIT DIVISION: 0

## 2016-09-07 LAB — CBC
HCT: 27.1 % — ABNORMAL LOW (ref 36.0–46.0)
Hemoglobin: 8.7 g/dL — ABNORMAL LOW (ref 12.0–15.0)
MCH: 24.2 pg — ABNORMAL LOW (ref 26.0–34.0)
MCHC: 32.1 g/dL (ref 30.0–36.0)
MCV: 75.5 fL — ABNORMAL LOW (ref 78.0–100.0)
PLATELETS: 369 10*3/uL (ref 150–400)
RBC: 3.59 MIL/uL — ABNORMAL LOW (ref 3.87–5.11)
RDW: 18.7 % — AB (ref 11.5–15.5)
WBC: 8.5 10*3/uL (ref 4.0–10.5)

## 2016-09-07 LAB — HEPATITIS C ANTIBODY (REFLEX): HCV AB: 0.2 {s_co_ratio} (ref 0.0–0.9)

## 2016-09-07 LAB — BASIC METABOLIC PANEL
Anion gap: 8 (ref 5–15)
BUN: <5 mg/dL — ABNORMAL LOW (ref 6–20)
CALCIUM: 8.6 mg/dL — AB (ref 8.9–10.3)
CO2: 28 mmol/L (ref 22–32)
CREATININE: 0.72 mg/dL (ref 0.44–1.00)
Chloride: 101 mmol/L (ref 101–111)
Glucose, Bld: 133 mg/dL — ABNORMAL HIGH (ref 65–99)
Potassium: 4 mmol/L (ref 3.5–5.1)
SODIUM: 137 mmol/L (ref 135–145)

## 2016-09-07 LAB — HCV COMMENT:

## 2016-09-07 LAB — HIV ANTIBODY (ROUTINE TESTING W REFLEX): HIV SCREEN 4TH GENERATION: NONREACTIVE

## 2016-09-07 MED ORDER — SODIUM CHLORIDE 0.9% FLUSH
10.0000 mL | INTRAVENOUS | Status: DC | PRN
  Administered 2016-09-10 – 2016-09-12 (×5): 10 mL
  Filled 2016-09-07 (×5): qty 40

## 2016-09-07 NOTE — Progress Notes (Signed)
Patient requesting to skip full liquid diet and move onto regular diet. MD paged.  Betha Loa Gale Hulse, RN

## 2016-09-07 NOTE — Progress Notes (Signed)
Patient doing well this morning Ankle dorsi flexion intact on the right Wound VAC minimal Await culture results.  Antibiotics per primary team.  Patient does have antibiotic beads in place in a large quantity these will dissolve over the next 3-4 weeks  I don't think this is the definitive procedure but she is not yet ready for definitive procedure

## 2016-09-07 NOTE — Progress Notes (Signed)
Subjective: Patient was feeling better when seen this morning. She had her incision and drainage done with wound VAC placed by orthopedic yesterday. She tolerated the procedure very well. She denies any pain at this time. She was able to move her feet without any difficulty. She was complaining of scratchy throat.  Objective:  Vital signs in last 24 hours: Vitals:   09/07/16 0100 09/07/16 0121 09/07/16 0400 09/07/16 0757  BP: (!) 163/62 (!) 165/74 (!) 144/60 (!) 97/52  Pulse: 94 91 92 89  Resp:    14  Temp:    98.6 F (37 C)  TempSrc:    Oral  SpO2:    91%  Weight:      Height:       Gen. well-developed, well-nourished lady, in no acute distress. Extremities. Clear bandage and wound VAC with minimum drainage was in place on right hip, no erythema or tenderness, was able to dorsiflex and plantarflex her right foot without any pain. 1+ lower extremity edema, pulses intact and symmetrical. Lungs. Clear bilaterally CV. Regular rate and rhythm. Abdomen. Soft, nontender, nondistended, bowel sounds hypoactive.  Labs. CBC Latest Ref Rng & Units 09/07/2016 09/06/2016 07/18/2016  WBC 4.0 - 10.5 K/uL 8.5 11.0(H) 12.1(H)  Hemoglobin 12.0 - 15.0 g/dL 8.7(L) 9.6(L) 8.1(L)  Hematocrit 36.0 - 46.0 % 27.1(L) 31.8(L) 26.2(L)  Platelets 150 - 400 K/uL 369 450(H) 440(H)   BMP Latest Ref Rng & Units 09/07/2016 09/06/2016 07/02/2016  Glucose 65 - 99 mg/dL 133(H) 99 152(H)  BUN 6 - 20 mg/dL <5(L) 6 10  Creatinine 0.44 - 1.00 mg/dL 0.72 0.75 0.87  BUN/Creat Ratio 9 - 23 - - -  Sodium 135 - 145 mmol/L 137 142 139  Potassium 3.5 - 5.1 mmol/L 4.0 3.9 4.1  Chloride 101 - 111 mmol/L 101 104 105  CO2 22 - 32 mmol/L 28 26 28   Calcium 8.9 - 10.3 mg/dL 8.6(L) 9.2 8.7(L)   Aerobic/Anaerobic Culture (surgical/deep wound Specimen Description HIP WOUND   Special Requests RIGHT SWABS OF DEEP SOFT TISSUE   Gram Stain RARE WBC PRESENT, PREDOMINANTLY PMN  NO ORGANISMS SEEN  Gram Stain Report Called to,Read Back  By and Verified With: M. Genevie Ann, PACU RN AT 2008 ON 09/06/16 BY C. JESSUP, MLT.       Culture PENDING   Report Status PENDING    Assessment/Plan:  50 year old woman with history of avascular necrosis of bilateral hips s/p right THA in 2012, hardware removal in 2016 and subsequent multiple irrigation and debridments for recurrent osteomyelitis, asthma, chronic bronchitis, DVT/PE, GERD, HTN, Lupus, PTSD with anxiety presenting with right hip pain.   Right hip osteomyelitis with abscess. She had her incision and drainage along with wound VAC was done by orthopedic surgery yesterday.Patient does have antibiotic beads in place in a large quantity these will dissolve over the next 3-4 weeks. Patient tolerated the procedure very well, denies any ongoing pain. Wound VAC was with minimum drainage. -Wound Gram stain shows rare WBCs and no organisms, we will wait for culture results. -Continue Rocephin and vancomycin, we will adjust antibiotics after culture and sensitivity report. -She had her last dose of Dilaudid 1 mg at 5:30 AM. She need hindquarter amputation for definitive treatment of her recurrent infection, patient is not ready for that.  Dysuria. Her current urinary analysis is normal. She has an history of recurrent UTI.  Hypertension. Her BP remains 97/52-167/80. She is not on any antihypertensive, we will keep monitoring her.  Lupus. Continue home dose  of hydroxychloroquine and prednisone.  GERD: Continue home PPI as Protonix 40mg  daily.  Chronic anemia: Most probably due to anemia of chronic disease. Her hemoglobin this morning was 8.7 after surgery,close to her baseline of 9. -Continue her home vitamin B12 and ferrous sulfate.  Asthma: Continue albuterol prn and Breo daily.  Anxiety: Continue home duloxetine.  Diet. Advance her diet as tolerated.   Dispo: Anticipated discharge in approximately 2-3 day(s).   Lorella Nimrod, MD 09/07/2016, 9:26 AM Pager: [phone]

## 2016-09-07 NOTE — Progress Notes (Signed)
  Date: 09/07/2016  Patient name: Katherine Banks  Medical record number: [id number]  Date of birth: 01-Jun-1966   This patient's plan of care was discussed with the house staff. Please see their note for complete details. I concur with their findings. Pain improved.   Vitals:   09/07/16 0400 09/07/16 0757  BP: (!) 144/60 (!) 97/52  Pulse: 92 89  Resp:  14  Temp:  98.6 F (37 C)  CV: RRR Chest: CTA Abd: bs+, soft, nontender.  Wound: R hip with vac in place, decreased fluctuance.    A/P Osteomyelitis of R hip with abscess Await her Cx result Continue vanco/ceftriaxone (day 1) Place PIC Greatly appreciate ortho eval.   Campbell Riches, MD 09/07/2016, 12:42 PM

## 2016-09-07 NOTE — Progress Notes (Signed)
Responded to consult. Visited with pt and her mom in room. Pt recounted some history of her bone disease and 27 surgeries. She understands the disease must be cut out of her bones, and  wants to know the full extent of her options. She doesn't wish her leg to be amputated, but is frightened at the extent of the last proposed amputation as she understood it. (She moved her gown aside and indicated her right side hip moving up to her buttocks.) She believes she's been waiting 2 wks for an answer on this from the doctor. Spiritual/emotional support and prayer were offered. Chaplain available for follow-up.   09/07/16 1100  Clinical Encounter Type  Visited With Patient and family together  Visit Type Initial;Psychological support;Spiritual support;Social support  Referral From Nurse  Spiritual Encounters  Spiritual Needs Prayer;Emotional  Stress Factors  Patient Stress Factors Exhausted;Health changes;Loss of control  Family Stress Factors Family relationships;Health changes

## 2016-09-07 NOTE — Telephone Encounter (Signed)
CSW f/u with Katherine Banks regarding referral.  Agency states they have attempted several times receiving voice mail.  Chart review, pt currently hospitalized.  CSW requested if agency to attempt contact next week 09/12/16.

## 2016-09-07 NOTE — Progress Notes (Signed)
Paged MD to make aware that patient has a pain rating of 9 and O2SAT on room air is 83% and 95% on 2L. Patient is very lethargic at this time. Patient offered tylenol but refused. 4:05 Provider is aware that I will hold narcotic until patient is more alert and vitals are stable.  Betha Loa Jezelle Gullick, RN

## 2016-09-08 DIAGNOSIS — Z9689 Presence of other specified functional implants: Secondary | ICD-10-CM

## 2016-09-08 LAB — CBC
HEMATOCRIT: 26.5 % — AB (ref 36.0–46.0)
HEMOGLOBIN: 8.3 g/dL — AB (ref 12.0–15.0)
MCH: 23.7 pg — ABNORMAL LOW (ref 26.0–34.0)
MCHC: 31.3 g/dL (ref 30.0–36.0)
MCV: 75.7 fL — ABNORMAL LOW (ref 78.0–100.0)
Platelets: 404 10*3/uL — ABNORMAL HIGH (ref 150–400)
RBC: 3.5 MIL/uL — ABNORMAL LOW (ref 3.87–5.11)
RDW: 18.9 % — AB (ref 11.5–15.5)
WBC: 11.1 10*3/uL — AB (ref 4.0–10.5)

## 2016-09-08 MED ORDER — HYDROMORPHONE HCL 2 MG/ML IJ SOLN
1.0000 mg | INTRAMUSCULAR | Status: DC | PRN
  Administered 2016-09-08 – 2016-09-10 (×11): 1 mg via INTRAVENOUS
  Filled 2016-09-08 (×11): qty 1

## 2016-09-08 NOTE — Op Note (Signed)
NAMEMarland Kitchen  Katherine, Banks NO.:  [id number]  MEDICAL RECORD NO.:  [id number]  LOCATION:                           FACILITY:  PHYSICIAN:  Anderson Malta, M.D.DATE OF BIRTH:  [birth year]  DATE OF PROCEDURE: DATE OF DISCHARGE:                              OPERATIVE REPORT   PREOPERATIVE DIAGNOSIS:  Right hip infection.  POSTOPERATIVE DIAGNOSIS:  Right hip infection.  PROCEDURE:  Right hip I and D with excisional debridement.  SURGEON:  Anderson Malta, M.D.  ASSISTANT:  Alyson Locket. Ricard Dillon, [place].  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  Seen here earlier this year by one of my partners, I and D performed.  She has subsequently developed recurrent infection and has known osteomyelitis in the proximal femur.  Presents now for repeat I and D.  Recommendation was made for __________ amputation; however, the patient does not want to do that and hence we will perform I and D.  PROCEDURE IN DETAIL:  The patient was brought to the operating room where general anesthetic was induced.  The patient was placed in a lateral position.  Left peroneal nerve well-padded.  Right leg was prescrubbed with alcohol and Betadine, allowed to air dry, prepped with DuraPrep solution and draped in a sterile manner.  Time-out was called. Prior posterior approach was utilized.  Abscess was pointing at the distal aspect of the incision.  To try to develop normal tissue planes, the incision was made distal to the prior incision; however, essentially there was nothing but scar tissue from the multiple procedures done through this approach.  The infection pocket was entered, cultures were obtained.  Through this pocket, dissection was performed in order to expose the entire pocket, which actually traveled on the superior aspect of the remaining bone into the more medial aspect of the femur.  This cavity  was entered and evacuated using curette and mechanical debridement.  Following excisional debridement of devitalized appearing skin, subcutaneous tissue, muscle, fascia, and small bone fragments, the wound was irrigated using about 6 L of irrigating solution.  Three sets of antibiotic beads with vancomycin and gentamicin were placed, both medially and laterally.  The soft tissue was then closed using #1 Vicryl suture, 2-0 Vicryl suture, and nylon suture.  Incisional wound VAC placed.  The patient tolerated the procedure well without immediate complications.  Transferred to the recovery room in a stable condition.     Anderson Malta, M.D.     GSD/MEDQ  D:  09/06/2016  T:  09/07/2016  Job:  [id number]

## 2016-09-08 NOTE — Evaluation (Signed)
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   Now s/p I&D x 2 with wound vac.  Clinical Impression  Patient presents with decreased independence with mobility due to deficits listed in PT problem list.  She will benefit from skilled PT in the acute setting to allow return home with family support and follow up HHPT.  Will need to practice stairs prior to d/c home.     Follow Up Recommendations Home health PT    Equipment Recommendations  None recommended by PT    Recommendations for Other Services       Precautions / Restrictions Precautions Precautions: Fall Restrictions Weight Bearing Restrictions: Yes RLE Weight Bearing: Non weight bearing      Mobility  Bed Mobility Overal bed mobility: Needs Assistance Bed Mobility: Supine to Sit     Supine to sit: Min assist;HOB elevated     General bed mobility comments: assist for R LE off bed  Transfers Overall transfer level: Needs assistance Equipment used: Rolling walker (2 wheeled) Transfers: Sit to/from Stand Sit to Stand: Min assist         General transfer comment: up from EOB min A for balance  Ambulation/Gait Ambulation/Gait assistance: Min guard Ambulation Distance (Feet): 20 Feet Assistive device: Rolling walker (2 wheeled) Gait Pattern/deviations: Step-to pattern     General Gait Details: in room in small space, ambulated around bed and back around to recliner  Stairs            Wheelchair Mobility    Modified Rankin (Stroke Patients Only)       Balance  Overall balance assessment: Needs assistance   Sitting balance-Leahy Scale: Good     Standing balance support: Bilateral upper extremity supported Standing balance-Leahy Scale: Poor Standing balance comment: UE support needed due to NWB                             Pertinent Vitals/Pain Pain Assessment: 0-10 Pain Score: 6  Pain Location: R hip Pain Descriptors / Indicators: Aching;Grimacing Pain Intervention(s): Monitored during session;Repositioned;Premedicated before session    Home Living Family/patient expects to be discharged to:: Private residence Living Arrangements: Parent Available Help at Discharge: Family;Available 24 hours/day Type of Home: House Home Access: Stairs to enter Entrance Stairs-Rails: Right;Left;Can reach both Entrance Stairs-Number of Steps: 4 Home Layout: One level Home Equipment: Walker - 4 wheels;Wheelchair - Sport and exercise psychologist Comments: Lives with mother.     Prior Function Level of Independence: Needs assistance   Gait / Transfers Assistance Needed: rollator for short ambulation around the house, w/c for community mobility.   ADL's / Homemaking Assistance Needed: assistance with dressing/bathing sometimes        Hand Dominance   Dominant Hand: Right    Extremity/Trunk Assessment   Upper Extremity Assessment: Overall WFL for tasks assessed           Lower Extremity Assessment: RLE deficits/detail RLE Deficits / Details: ankle AROM WFL, unable to lift leg unaided, remains in externally rotated position       Communication   Communication: No difficulties  Cognition Arousal/Alertness: Awake/alert  Behavior During Therapy: Flat affect Overall Cognitive Status: Within Functional Limits for tasks assessed                      General Comments General comments (skin integrity, edema, etc.): wound vac R hip    Exercises     Assessment/Plan    PT Assessment Patient needs continued PT services   PT Problem List Decreased activity tolerance;Decreased range of motion;Decreased strength;Decreased mobility;Decreased balance;Decreased knowledge of use of DME          PT Treatment Interventions DME instruction;Gait training;Balance training;Stair training;Functional mobility training;Therapeutic exercise;Therapeutic activities;Patient/family education;Wheelchair mobility training    PT Goals (Current goals can be found in the Care Plan section)  Acute Rehab PT Goals Patient Stated Goal: To return home PT Goal Formulation: With patient Time For Goal Achievement: 09/15/16 Potential to Achieve Goals: Good    Frequency Min 3X/week   Barriers to discharge        Co-evaluation               End of Session Equipment Utilized During Treatment: Gait belt Activity Tolerance: Patient tolerated treatment well Patient left: in chair;with call bell/phone within reach Nurse Communication: Mobility status         Time: 1420-1441 PT Time Calculation (min) (ACUTE ONLY): 21 min   Charges:   PT Evaluation $PT Eval Moderate Complexity: 1 Procedure     PT G CodesReginia Naas 2016-09-26, 4:55 PM Magda Kiel, [place] 2016-09-26]

## 2016-09-08 NOTE — Care Management Note (Signed)
Case Management Note  Patient Details  Name: Katherine Banks MRN: [id number] Date of Birth: 08/10/1966  Subjective/Objective:    Admitted with cellulitis (R)hip, hx of SLE and R THR 2012 and multiple subsequent surgeries. Lives with mom.        Action/Plan: Plan is to d/c to home when medically stable with home health services( RN- IV antibiotic therapy / wound vac)  Expected Discharge Date:                  Expected Discharge Plan:  [place]  In-House Referral:     Discharge planning Services  CM Consult  Post Acute Care Choice:    Choice offered to:  Patient  DME Arranged:  IV pump/equipment (Referral made with Pam @ [phone] for IV ABX infusion) DME Agency:  [place]:  RN [facility] Agency:  [facility] (Referral made with [facility] @ [phone] for home health RN)  Status of Service:  In process, will continue to follow  If discussed at Long Length of Stay Meetings, dates discussed:    Additional Comments:  Sharin Mons, RN 09/08/2016, 4:18 PM

## 2016-09-08 NOTE — Progress Notes (Signed)
Patient stable Minimal drainage wound VAC Mobilize as tolerated My partner Dr. Ninfa Linden actually tried to arrange a second opinion with 2; however they responded that this process needed to be and affect for over a year before they would consider evaluation.  I'm not exactly sure what that means for Katherine Banks but it may be in her best interest to pursue that second opinion on her own to see if they would have options for limb salvage

## 2016-09-08 NOTE — Progress Notes (Signed)
[facility]  Patient will have Home Health services provided by [facility]. [facility] will partner with [facility] to provide IV ABX for pt at home upon DC. [facility] hospital team will follow pt until DC to support transition home.   If patient discharges after hours, please call [phone].   Katherine Banks 09/08/2016, 5:05 PM

## 2016-09-08 NOTE — Progress Notes (Signed)
   Subjective: Patient was complaining of right hip pain and seen early today, her pain improved with Dilaudid been seen during rounds. She was able to move her leg and foot without difficulty.  Objective:  Vital signs in last 24 hours: Vitals:   09/08/16 0800 09/08/16 0842 09/08/16 1142 09/08/16 1300  BP: (!) 159/79  (!) 158/74 (!) 164/73  Pulse: 86  99 95  Resp: 15     Temp: 98.3 F (36.8 C)  98.2 F (36.8 C) 98.2 F (36.8 C)  TempSrc: Oral  Oral Oral  SpO2: 95% 95% 96% 96%  Weight:      Height:       Gen. well-developed, well-nourished lady, in no acute distress. Lungs. Clear bilaterally CV. Regular rate and rhythm. Abdomen. Soft, nontender, nondistended, bowel sounds present. Extremities. Clear bandage and wound VAC with minimum drainage was in place on right hip, no erythema or tenderness, was able to dorsiflex and plantarflex her right foot without any pain. 1+ lower extremity edema, pulses intact and symmetrical.  Labs. CBC Latest Ref Rng & Units 09/08/2016 09/07/2016 09/06/2016  WBC 4.0 - 10.5 K/uL 11.1(H) 8.5 11.0(H)  Hemoglobin 12.0 - 15.0 g/dL 8.3(L) 8.7(L) 9.6(L)  Hematocrit 36.0 - 46.0 % 26.5(L) 27.1(L) 31.8(L)  Platelets 150 - 400 K/uL 404(H) 369 450(H)   Wound culture. No growth after more than 24 hour.  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   Right hip osteomyelitis with abscess. Wound VAC was in place with minimal drainage. She wants to get a second opinion, orthopedic surgery tried to get her an appointment, they were unable to do so, patient was advised to make an appointment on her own. -Pain control is with Dilaudid 1 mg every 4 hourly if needed and tramadol 50 mg every 6 hourly  if needed. -Continue Rocephin and vancomycin. -Mobilize the patient.  Elevated blood pressure. Her blood pressure remains mildly elevated between 149/80-164/73 over the last 24 hour. She is not on any antihypertensives at home.  Lupus. Continue home dose of hydroxychloroquine and prednisone.  GERD: Continue home PPI as Protonix 40mg  daily.  Chronic anemia: Most probably due to anemia of chronic disease. Her hemoglobin this morning was 8.7 after surgery,close to her baseline of 9. -Continue her home vitamin B12 and ferrous sulfate.  Asthma: Continue albuterol prn and Breo daily.  Anxiety: Continue home duloxetine.  Dispo: Anticipated discharge in approximately 1-2 day(s).   Lorella Nimrod, MD 09/08/2016, 2:09 PM Pager: [phone]

## 2016-09-08 NOTE — Telephone Encounter (Signed)
Will await discharge med rec

## 2016-09-08 NOTE — Progress Notes (Addendum)
  Date: 09/08/2016  Patient name: Katherine Banks  Medical record number: [id number]  Date of birth: Nov 02, 1965   This patient's plan of care was discussed with the house staff. Please see their note for complete details. I concur with their findings. Without complaints. Still some hip pain.  She is asking about transfer to [facility] for second opinion.   Vitals:   09/08/16 0612 09/08/16 0800  BP:  (!) 159/79  Pulse: 86 86  Resp:  15  Temp: 98.3 F (36.8 C) 98.3 F (36.8 C)   R chest port- clean, nontender.  R hip- wound clean, VAC in place, less fluctuance. Continued heat  Lab- Her cx is incubating.   A/P Osteomyelitis of R hip Await her Cx No change in anbx ESR (81)  My great appreciation to Dr Marlou Sa Defer [facility] eval to outpt unless ortho feels strongly that she should be transferred.    Campbell Riches, MD 09/08/2016, 11:32 AM

## 2016-09-09 DIAGNOSIS — Z95828 Presence of other vascular implants and grafts: Secondary | ICD-10-CM

## 2016-09-09 DIAGNOSIS — B951 Streptococcus, group B, as the cause of diseases classified elsewhere: Secondary | ICD-10-CM

## 2016-09-09 LAB — CBC
HEMATOCRIT: 28.6 % — AB (ref 36.0–46.0)
HEMOGLOBIN: 8.9 g/dL — AB (ref 12.0–15.0)
MCH: 23.6 pg — ABNORMAL LOW (ref 26.0–34.0)
MCHC: 31.1 g/dL (ref 30.0–36.0)
MCV: 75.9 fL — ABNORMAL LOW (ref 78.0–100.0)
Platelets: 432 10*3/uL — ABNORMAL HIGH (ref 150–400)
RBC: 3.77 MIL/uL — AB (ref 3.87–5.11)
RDW: 19.3 % — ABNORMAL HIGH (ref 11.5–15.5)
WBC: 14.4 10*3/uL — ABNORMAL HIGH (ref 4.0–10.5)

## 2016-09-09 LAB — BASIC METABOLIC PANEL
Anion gap: 9 (ref 5–15)
BUN: 9 mg/dL (ref 6–20)
CHLORIDE: 102 mmol/L (ref 101–111)
CO2: 29 mmol/L (ref 22–32)
CREATININE: 0.79 mg/dL (ref 0.44–1.00)
Calcium: 9.3 mg/dL (ref 8.9–10.3)
GFR calc non Af Amer: >60 mL/min (ref >60)
Glucose, Bld: 153 mg/dL — ABNORMAL HIGH (ref 65–99)
POTASSIUM: 3.8 mmol/L (ref 3.5–5.1)
Sodium: 140 mmol/L (ref 135–145)

## 2016-09-09 LAB — VANCOMYCIN, TROUGH: Vancomycin Tr: 18 ug/mL (ref 15–20)

## 2016-09-09 MED ORDER — OXYCODONE HCL ER 10 MG PO T12A
30.0000 mg | EXTENDED_RELEASE_TABLET | Freq: Two times a day (BID) | ORAL | Status: DC
  Administered 2016-09-09 – 2016-09-13 (×9): 30 mg via ORAL
  Filled 2016-09-09 (×9): qty 3

## 2016-09-09 MED ORDER — AMLODIPINE BESYLATE 10 MG PO TABS
10.0000 mg | ORAL_TABLET | Freq: Every day | ORAL | Status: DC
  Administered 2016-09-09 – 2016-09-13 (×5): 10 mg via ORAL
  Filled 2016-09-09 (×5): qty 1

## 2016-09-09 NOTE — Care Management Note (Addendum)
Case Management Note  Patient Details  Name: Katherine Banks MRN: [id number] Date of Birth: [birth year]  Subjective/Objective:   Right hip osteomyelitis with abscess                 Action/Plan: Discharge Planning:  NCM spoke to pt and [facility] RN arranged with [facility]. [facility] will provided IV abx. Will need IV Abx Rx faxed to [facility] pharmacy fax  [phone] if dc on weekend. Pt states she lives at home with mother. Has RW, and bedside commode. [facility] Wound Vac in the room and Unit RN will apply prior to her dc home.    Notified [facility] and they have [place] RN prepared to go out Sunday night if pt dc home on weekend.     PCP  [name] MD  Expected Discharge Date:                  Expected Discharge Plan:  [place] (Resides with mom)  In-House Referral:  NA  Discharge planning Services  CM Consult  Post Acute Care Choice:  Home Health Choice offered to:  Patient  DME Arranged:  IV pump/equipment, Vac (Referral made with Pam @ [phone] for IV ABX infusion) DME Agency:  [place]  HH Arranged:  RN, IV Antibiotics HH Agency:  [facility] (Referral made with [facility] @ [phone] for home health RN)  Status of Service:  In process, will continue to follow  If discussed at Long Length of Stay Meetings, dates discussed:    Additional Comments:  Erenest Rasher, RN 09/09/2016, 3:42 PM

## 2016-09-09 NOTE — Progress Notes (Signed)
Subjective: Patient had a rough night, complaining of increase in her right hip pain after mobilization. She was using bedside commode after removal of Foley's. She states that her current pain management regimen is unable to control her pain very well since she started mobilizing.  Objective:  Vital signs in last 24 hours: Vitals:   09/08/16 2126 09/09/16 0838 09/09/16 0936 09/09/16 1220  BP: (!) 177/92 (!) 178/88 (!) 178/88 (!) 155/88  Pulse: 95 90 88 96  Resp: 20     Temp: 98.4 F (36.9 C)     TempSrc: Oral     SpO2: 98%  96%   Weight:      Height:       Gen.well-developed, well-nourished lady, in no acute distress. Extremities.Clear bandage and wound VAC with minimum drainage was in place on right hip,no erythema or tenderness,was able to dorsiflex and plantarflex her right foot without any pain. 1+ lower extremity edema,pulses intact and symmetrical. Lungs.Clear bilaterally CV. Regular rate and rhythm. Abdomen. Soft, nontender, nondistended, bowel sounds present.  Labs. CBC Latest Ref Rng & Units 09/09/2016 09/08/2016 09/07/2016  WBC 4.0 - 10.5 K/uL 14.4(H) 11.1(H) 8.5  Hemoglobin 12.0 - 15.0 g/dL 8.9(L) 8.3(L) 8.7(L)  Hematocrit 36.0 - 46.0 % 28.6(L) 26.5(L) 27.1(L)  Platelets 150 - 400 K/uL 432(H) 404(H) 369   BMP Latest Ref Rng & Units 09/09/2016 09/07/2016 09/06/2016  Glucose 65 - 99 mg/dL 153(H) 133(H) 99  BUN 6 - 20 mg/dL 9 <5(L) 6  Creatinine 0.44 - 1.00 mg/dL 0.79 0.72 0.75  BUN/Creat Ratio 9 - 23 - - -  Sodium 135 - 145 mmol/L 140 137 142  Potassium 3.5 - 5.1 mmol/L 3.8 4.0 3.9  Chloride 101 - 111 mmol/L 102 101 104  CO2 22 - 32 mmol/L 29 28 26   Calcium 8.9 - 10.3 mg/dL 9.3 8.6(L) 9.2   Aerobic/Anaerobic Culture (surgical/deep wound)  Order: [id number]  Status:  Preliminary result Visible to patient:  No (Not Released) Next appt:  None   3d ago  Specimen Description HIP WOUND   Special Requests RIGHT SWABS OF DEEP SOFT TISSUE   Gram Stain RARE  WBC PRESENT, PREDOMINANTLY PMN  NO ORGANISMS SEEN  Gram Stain Report Called to,Read Back By and Verified With: M. Genevie Ann, PACU RN AT 2008 ON 09/06/16 BY C. JESSUP, MLT.       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   Right hip osteomyelitis with abscess. Wound VAC was in place with minimal drainage. She was complaining of increase in her pain with mobilization today. Her wound culture is growing rare group B strep (Strep agalactia). She remains afebrile with increase in her WBC count. -Add long-acting OxyContin 30 mg twice daily. -Continue Dilaudid every 4 hourly.  -Continue Rocephin and vancomycin until sensitivity results become available.  Hypertension. Her blood pressure remains elevated," Patient find out that she was on clonidine 0.2 mg twice daily and a combination of amlodipine, losartan and hydrochlorothiazide at home. Called in pharmacy find out that she was on the filling her clonidine, apparently she developed nausea vomiting with that combination, not sure about the cause. -Start her on amlodipine 10 mg daily and keep monitoring  her blood pressure.  Lupus. Continue home dose of hydroxychloroquine and prednisone.  GERD: Continue home PPI as Protonix 40mg  daily.  Chronic anemia. Stable. -Continue her home vitamin B12 and ferrous sulfate.  Asthma: Continue albuterol prn and Breo daily.  Anxiety: Continue home duloxetine.  Dispo: Anticipated discharge in approximately 1-2 day(s).   Lorella Nimrod, MD 09/09/2016, 2:19 PM Pager: [phone]

## 2016-09-09 NOTE — Progress Notes (Signed)
Pt stable Mild increase in pain overnight Need to change to home vac on dc Sunday - nurse is currently charging portable discharge unit Plan to remove vac Monday or Tuesday in office

## 2016-09-09 NOTE — Progress Notes (Signed)
Paged MD regarding BP of 178/88 hr 90 pain 8/10. MD: No orders at this time. Allow pain medication to help pain, recheck BP and report.

## 2016-09-09 NOTE — Progress Notes (Signed)
  Date: 09/09/2016  Patient name: Katherine Banks  Medical record number: [id number]  Date of birth: [birth year]   This patient's plan of care was discussed with the house staff. Please see their note for complete details. I concur with their findings. C/o pain with ambulating. Had foley removed.  R chest port non-tender.  R hip VAC in place. Mild tenderness.   Appreciate othro f/u.  awiat her Cx has grwon Group B strep. sensi pending (PEN).  Planing for d/c over weekend Has port No change in anbx for now.   Campbell Riches, MD 09/09/2016, 9:37 AM

## 2016-09-09 NOTE — Progress Notes (Signed)
PT Cancellation Note  Patient Details Name: Katherine Banks MRN: [id number] DOB: [birth year]   Cancelled Treatment:    Reason Eval/Treat Not Completed: Pain limiting ability to participate.  Pt reports she has been having a very rough day with increased pain from increased mobility.  She would like to defer session today until tomorrow.  PT will check on pt tomorrow to progress mobility to prepare for possible d/c home early in the week (pt reports maybe Monday).  Thanks,    Barbarann Ehlers. Enio Hornback, PT, DPT [phone]   09/09/2016, 5:06 PM

## 2016-09-09 NOTE — Care Management Important Message (Signed)
Important Message  Patient Details  Name: Bradley Voorheis MRN: [id number] Date of Birth: May 31, 1966   Medicare Important Message Given:  Yes    Danitra Payano Abena 09/09/2016, 11:03 AM

## 2016-09-09 NOTE — Progress Notes (Signed)
Pharmacy Antibiotic Note  Katherine Banks is a 50 y.o. female admitted on 09/06/2016 with osteomyelitis of R hip. Pharmacy consulted for Vancomycin and Ceftriaxone dosing.  Pt with h/o recurrent infections of the R hip requiring hardware removal and debridement. Renal fxn stable. WBC trending up (on prednisone 5mg  QID), afebrile. ID on board, want to continue antibiotics for now, plan to discharge over the weekend. If continues on vancomycin, plan for another trough within a week, patient is obese and will likely accumulate with continued dosing.  Please see previous note for antibiotic and ID history.   11/10 Vancomycin trough = 18 (therapeutic, drawn appropriately 11 hrs after last dose)  Plan: -Continue Ceftriaxone 2 gm IV q24hrs. -Continue Vancomycin 1 gm IV q12h -Will follow renal function, culture data, progress. -Vancomycin trough when clinically indicated.   Height: 5\' 6"  (167.6 cm) Weight: 249 lb (112.9 kg) IBW/kg (Calculated) : 59.3  Temp (24hrs), Avg:98.3 F (36.8 C), Min:98.2 F (36.8 C), Max:98.4 F (36.9 C)   Recent Labs Lab 09/06/16 0223 09/06/16 0247 09/07/16 0620 09/08/16 0500 09/09/16 0517  WBC 11.0*  --  8.5 11.1* 14.4*  CREATININE 0.75  --  0.72  --  0.79  LATICACIDVEN  --  1.43  --   --   --   VANCOTROUGH  --   --   --   --  18    Estimated Creatinine Clearance: 104.8 mL/min (by C-G formula based on SCr of 0.79 mg/dL).    Allergies  Allergen Reactions  . Compazine [Prochlorperazine Edisylate] Anaphylaxis, Swelling and Rash  . Morphine And Related Anaphylaxis    Projectile vomiting and coughing also  . Phenergan [Promethazine Hcl] Anaphylaxis  . Toradol [Ketorolac Tromethamine] Anaphylaxis, Hives and Rash  . Exforge Hct [Amlodipine-Valsartan-Hctz] Nausea And Vomiting    Thoughts were not settled, neurological changes also  . Influenza Vaccines Itching, Nausea And Vomiting and Rash    Swollen lips   . Latex Rash  . Tape Itching and Rash     Antimicrobials this admission:  Vancomycin 11/7>>  Ceftriaxone 11/7>>  Zosyn x 1 on 11/7  Dose adjustments this admission:  n/a  Microbiology results:  11/7 Blood x 2 - ngtd  11/7 hip wound/deep soft tissue - no organisms on gram stain - cx pending  11/7 MRSA screen - negative  Thank you for allowing pharmacy to be a part of this patient's care.  [place] Student 09/09/2016 10:42 AM

## 2016-09-10 MED ORDER — LOSARTAN POTASSIUM 50 MG PO TABS
50.0000 mg | ORAL_TABLET | Freq: Every day | ORAL | Status: DC
  Administered 2016-09-10 – 2016-09-11 (×2): 50 mg via ORAL
  Filled 2016-09-10 (×2): qty 1

## 2016-09-10 MED ORDER — OXYCODONE HCL 5 MG PO TABS
15.0000 mg | ORAL_TABLET | ORAL | Status: DC | PRN
  Administered 2016-09-10 – 2016-09-13 (×12): 15 mg via ORAL
  Filled 2016-09-10 (×12): qty 3

## 2016-09-10 NOTE — Progress Notes (Signed)
  Date: 09/10/2016  Patient name: Katherine Banks  Medical record number: [id number]  Date of birth: 03/29/1966   This patient's plan of care was discussed with the house staff. Please see their note for complete details. I concur with their findings. Still some pain.  Cx group B strep.  Will continue ceftriaxone, plan for at least 6 weeks. She has port in place.   Campbell Riches, MD 09/10/2016, 3:07 PM

## 2016-09-10 NOTE — Progress Notes (Signed)
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   Now s/p I&D x 2 with wound vac.    PT Comments    Pt only willing to get up to [facility] with PT today. Supervision for transfers with NWB on right LE. Pt should benefit from continued PT to progress toward goals, acute PT to continue.  Follow Up Recommendations  Home health PT     Equipment Recommendations  None recommended by PT    Precautions / Restrictions Precautions Precautions: Fall Restrictions RLE Weight Bearing: Non weight bearing    Mobility  Bed Mobility Overal bed mobility: Needs Assistance       Supine to sit: Min guard     General bed mobility comments: with HOB elevated 40 degrees, increased time needed  Transfers Overall transfer level: Needs assistance Equipment used: Rolling walker (2 wheeled) Transfers: Sit to/from [facility] Sit to Stand: Min guard Stand pivot transfers: Min guard       General transfer comment: with RW assist. demo'd safe technique with sit<>stand transfers and stand<>pivot transfer bed<>bsc. pt maintained NWB without issues.       Balance Overall balance assessment: Needs assistance         Standing balance support: Single extremity supported;During functional activity Standing balance-Leahy Scale: Good Standing balance comment: Pt able to support self on RW with 1 UE while performing self pericare, NWB on right LE, with supervision assistance.                     Cognition Arousal/Alertness: Awake/alert Behavior During  Therapy: WFL for tasks assessed/performed Overall Cognitive Status: Within Functional Limits for tasks assessed                       Pertinent Vitals/Pain Pain Assessment: 0-10 Pain Score: 8  Pain Location: right hip Pain Descriptors / Indicators: Aching;Sore Pain Intervention(s): Limited activity within patient's tolerance;Monitored during session;Patient requesting pain meds-RN notified;Repositioned     PT Goals (current goals can now be found in the care plan section) Acute Rehab PT Goals Patient Stated Goal: To return home PT Goal Formulation: With patient Time For Goal Achievement: 09/15/16 Potential to Achieve Goals: Good Progress towards PT goals: Progressing toward goals    Frequency    Min 3X/week      PT Plan Current plan remains appropriate    End of Session Equipment Utilized During Treatment: Gait belt Activity Tolerance: Patient tolerated treatment well;Patient limited by pain Patient left: in bed;with call bell/phone within reach;with nursing/sitter in room     Time: [id number] PT Time Calculation (min) (ACUTE ONLY): 16 min  Charges:  $Therapeutic Activity: 8-22 mins            Willow Ora 09/10/2016, 2:00 PM   Willow Ora, PTA, [place] [address] [place], [place] [address] [phone] 09/10/16, 2:00 PM

## 2016-09-10 NOTE — Progress Notes (Signed)
Subjective: Patient states that her pain is better as compared to yesterday still having 8 out of 10 pain after 3 hours of Dilaudid. She states that that started effect weans off within 3 hour. I told her that I didn't put her back on her home regimen today. She gets her opioid prescriptions from a pain management clinic. She had her upcoming appointment on Friday. She was also complaining of lower abdominal pain and burning micturition since yesterday late afternoon. She was requesting to be discharged on Monday, as there is no one at home to take care of her over the weekend.  Objective:  Vital signs in last 24 hours: Vitals:   09/09/16 1445 09/09/16 2142 09/10/16 0529 09/10/16 0825  BP: (!) 164/85 (!) 158/81 (!) 178/88   Pulse: 94 91 83   Resp:   20   Temp: 98.3 F (36.8 C) 98.2 F (36.8 C) 97.9 F (36.6 C)   TempSrc: Oral Oral Oral   SpO2: 96% 98% 97% 98%  Weight:      Height:       Gen. well-developed lady, alert and oriented, lying comfortably in her bed, in no acute distress. Lungs.Clear bilaterally CV. Regular rate and rhythm. Abdomen.soft, mildly tender lower abdomen, nondistended, bowel sounds positive. Extremities. Clear bandage and wound VAC with minimum drainage was in place on right hip,no erythema or tenderness,was able to dorsiflex and plantarflex her right foot without any pain. No edema,pulses intact and symmetrical. Aerobic/Anaerobic Culture (surgical/deep wound)  Order: [id number]  Status:  Preliminary result Visible to patient:  No (Not Released) Next appt:  None   4d ago  Specimen Description HIP WOUND   Special Requests RIGHT SWABS OF DEEP SOFT TISSUE   Gram Stain RARE WBC PRESENT, PREDOMINANTLY PMN  NO ORGANISMS SEEN  Gram Stain Report Called to,Read Back By and Verified With: M. Genevie Ann, PACU RN AT 2008 ON 09/06/16 BY C. JESSUP, MLT.       Culture RARE GROUP B STREP(S.AGALACTIAE)ISOLATED NO ANAEROBES ISOLATED; CULTURE IN PROGRESS FOR 5 DAYS     Report Status PENDING   Organism ID, Bacteria GROUP B STREP(S.AGALACTIAE)ISOLATED   Resulting Agency [facility]  Susceptibility    Group b strep(s.agalactiae)isolated    MIC    AMPICILLIN <=0.25 SENSITIVE "><=0.25 SENS... Sensitive    CEFTRIAXONE <=0.12 SENSITIVE "><=0.12 SENS... 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   Right hip osteomyelitis with abscess.Wound VAC was in place with minimal drainage. Still having pain with ambulation. -Stop Dilaudid,. -Start her on immediate release oxycodone 15 mg every 4 hourly for breakthrough pain(her home regimen ) -Continue extended release OxyContin 30 mg twice daily. -Her morphine equavalent with this current regimen is 180. -Continue Rocephin, has her culture results show sensitivity to Rocephin.  Hypertension. Her blood pressure remains elevated, with the start of amlodipine 10 mg yesterday. -Start her on losartan 50 mg daily. -Continue amlodipine 10 mg daily  -Monitor her  blood pressure.  Lupus. Continue home dose of hydroxychloroquine and prednisone.  Asthma: Continue albuterol prn and Breo daily.  Anxiety: Continue home duloxetine.  Chronic anemia. Stable. -Continue her home vitamin B12 and ferrous sulfate.  GERD: Continue home PPI as Protonix 40mg  daily.   Dispo: Anticipated discharge in approximately 1-2 day(s).   Lorella Nimrod, MD 09/10/2016, 9:16  AM Pager: [phone]

## 2016-09-11 DIAGNOSIS — D72829 Elevated white blood cell count, unspecified: Secondary | ICD-10-CM

## 2016-09-11 LAB — CBC
HEMATOCRIT: 29.4 % — AB (ref 36.0–46.0)
HEMOGLOBIN: 9 g/dL — AB (ref 12.0–15.0)
MCH: 23.9 pg — ABNORMAL LOW (ref 26.0–34.0)
MCHC: 30.6 g/dL (ref 30.0–36.0)
MCV: 78 fL (ref 78.0–100.0)
Platelets: 496 10*3/uL — ABNORMAL HIGH (ref 150–400)
RBC: 3.77 MIL/uL — AB (ref 3.87–5.11)
RDW: 20.4 % — ABNORMAL HIGH (ref 11.5–15.5)
WBC: 17 10*3/uL — AB (ref 4.0–10.5)

## 2016-09-11 LAB — TROPONIN I
Troponin I: <0.03 ng/mL (ref <0.03)
Troponin I: <0.03 ng/mL (ref <0.03)

## 2016-09-11 LAB — BASIC METABOLIC PANEL
ANION GAP: 10 (ref 5–15)
BUN: 16 mg/dL (ref 6–20)
CHLORIDE: 104 mmol/L (ref 101–111)
CO2: 26 mmol/L (ref 22–32)
Calcium: 8.8 mg/dL — ABNORMAL LOW (ref 8.9–10.3)
Creatinine, Ser: 0.92 mg/dL (ref 0.44–1.00)
GFR calc non Af Amer: >60 mL/min (ref >60)
Glucose, Bld: 172 mg/dL — ABNORMAL HIGH (ref 65–99)
POTASSIUM: 3.8 mmol/L (ref 3.5–5.1)
SODIUM: 140 mmol/L (ref 135–145)

## 2016-09-11 LAB — CULTURE, BLOOD (ROUTINE X 2)
CULTURE: NO GROWTH
CULTURE: NO GROWTH

## 2016-09-11 LAB — AEROBIC/ANAEROBIC CULTURE (SURGICAL/DEEP WOUND)

## 2016-09-11 LAB — AEROBIC/ANAEROBIC CULTURE W GRAM STAIN (SURGICAL/DEEP WOUND)

## 2016-09-11 MED ORDER — NITROGLYCERIN 0.4 MG SL SUBL: 0.4000 mg | SUBLINGUAL_TABLET | SUBLINGUAL | Status: DC | PRN

## 2016-09-11 MED ORDER — HYDROCHLOROTHIAZIDE 25 MG PO TABS
25.0000 mg | ORAL_TABLET | Freq: Every day | ORAL | Status: DC
  Administered 2016-09-12 – 2016-09-13 (×2): 25 mg via ORAL
  Filled 2016-09-11 (×2): qty 1

## 2016-09-11 MED ORDER — HYDROCHLOROTHIAZIDE 25 MG PO TABS: 25.0000 mg | ORAL_TABLET | Freq: Every day | ORAL | Status: DC

## 2016-09-11 NOTE — Progress Notes (Signed)
   Subjective: Patient was feeling better when seen this morning. She had a short episode of chest tightness last night, her troponin and ECG remains negative. Her chest pain resolved on her own and she fell asleep. She denies any more chest pain, palpitations, shortness of breath. States that she has better control of pain on her home regimen of pain medicine.  Objective:  Vital signs in last 24 hours: Vitals:   09/10/16 2153 09/11/16 0055 09/11/16 0452 09/11/16 0808  BP: 139/70 (!) 161/83 128/68   Pulse: 74 76 66   Resp: 18 17 18    Temp: 97.5 F (36.4 C) 98.1 F (36.7 C) 97.5 F (36.4 C)   TempSrc: Oral Oral Oral   SpO2: 97% 100% 96% 96%  Weight:      Height:       Gen. well-developed lady, alert and oriented, lying comfortably in her bed, in no acute distress. Extremities. Clear bandage and wound VAC with minimum drainage was in place on right hip,no erythema or tenderness,was able to dorsiflex and plantarflex her right foot without any pain. No edema,pulses intact and symmetrical. Lungs.Clear bilaterally CV. Regular rate and rhythm. Abdomen.soft, mildly tender lower abdomen, nondistended, bowel sounds positive.  Labs. CBC Latest Ref Rng & Units 09/11/2016 09/09/2016 09/08/2016  WBC 4.0 - 10.5 K/uL 17.0(H) 14.4(H) 11.1(H)  Hemoglobin 12.0 - 15.0 g/dL 9.0(L) 8.9(L) 8.3(L)  Hematocrit 36.0 - 46.0 % 29.4(L) 28.6(L) 26.5(L)  Platelets 150 - 400 K/uL 496(H) 432(H) 404(H)   BMP Latest Ref Rng & Units 09/11/2016 09/09/2016 09/07/2016  Glucose 65 - 99 mg/dL 172(H) 153(H) 133(H)  BUN 6 - 20 mg/dL 16 9 <5(L)  Creatinine 0.44 - 1.00 mg/dL 0.92 0.79 0.72  BUN/Creat Ratio 9 - 23 - - -  Sodium 135 - 145 mmol/L 140 140 137  Potassium 3.5 - 5.1 mmol/L 3.8 3.8 4.0  Chloride 101 - 111 mmol/L 104 102 101  CO2 22 - 32 mmol/L 26 29 28   Calcium 8.9 - 10.3 mg/dL 8.8(L) 9.3 8.6(L)   Trop. 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   Right hip osteomyelitis with abscess.Wound VAC was in place with minimal drainage. Pain is better control on her home regimen of OxyContin 30 mg twice daily and oxycodone 15 mg every 4 hourly for breakthrough pain. -Her WBC count is increasing, it was 17 today. She is on Rocephin, sensitive according to culture report. Her port site is without any erythema,, edema or tenderness. She remains afebrile. She is also having thrombocytosis with platelet count of 496. -Repeat CBC tomorrow. -Continue Rocephin.  Hypertension. Her blood pressure improved with addition of losartan yesterday, but she developed nausea. She states this is same as she was experiencing with her combination pill. -Stop losartan. -Start her on HCTZ 25 mg daily.  Lupus. Continue home dose of hydroxychloroquine and prednisone.  Asthma: Continue albuterol prn and Breo daily.  Anxiety: Continue home duloxetine.  Chronic anemia. Stable. -Continue her home vitamin B12 and ferrous sulfate.  GERD: Continue home PPI as Protonix 40mg  daily.   Dispo: Anticipated discharge in approximately 1 day(s).   Lorella Nimrod, MD 09/11/2016, 1:18 PM Pager: [phone]

## 2016-09-11 NOTE — Progress Notes (Addendum)
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   Now s/p I&D x 2 with wound vac.    PT Comments    Pt transferred with RW and good adherence of NWB status.  Pt relays she has 3 steps to enter home with 2 rails.  Pt taken to gym on other unit to practice stairs due to possible d/c tomorrow.  Pt able to hop up 1 step with MOD A and reports of increased pain in R hip. Then began discussion of other ways to enter home and pt then stated, "the ambulance always takes me home from the hospital and I don't have to do the steps."  This probably would be the safest way due to pt lives with her mother who arrived at end of session in an electric scooter and would not be able to physically assist pt. Con't to recommend HHPT.  Pt with c/o chest pain earlier in day, but no complaints during therapy and lab values were ok, with RN okaying session.  Follow Up Recommendations  Home health PT;Other (comment) (ambulance transport home)     Equipment Recommendations  None recommended by PT    Recommendations for Other Services       Precautions / Restrictions Restrictions Weight Bearing Restrictions: Yes RLE Weight Bearing: Non weight bearing    Mobility  Bed Mobility Overal bed mobility: Needs Assistance Bed Mobility: Supine to Sit     Supine to sit: Supervision     General bed mobility comments: HOB elevated  Transfers Overall transfer level: Needs assistance Equipment used: Rolling walker (2 wheeled) Transfers: Sit to/from Stand Sit to Stand: Supervision Stand  pivot transfers: Supervision       General transfer comment: Good adherence of NWB on R LE  Ambulation/Gait                 Stairs Stairs: Yes Stairs assistance: Mod assist Stair Management: Two rails;Forwards Number of Stairs: 1 General stair comments: Took 2 attempts to hop up 1 step with MOD A and pt reporting pain in R hip.  then she stated, "I don't have to do stairs, the ambulance always takes me home from the hospital."  Wheelchair Mobility    Modified Rankin (Stroke Patients Only)       Balance     Sitting balance-Leahy Scale: Good     Standing balance support: Bilateral upper extremity supported Standing balance-Leahy Scale: Fair                      Cognition Arousal/Alertness: Awake/alert Behavior During Therapy: WFL for tasks assessed/performed Overall Cognitive Status: Within Functional Limits for tasks assessed                      Exercises      General Comments        Pertinent Vitals/Pain Pain Assessment: 0-10 Pain Score: 7  Pain Location: R hip Pain Descriptors / Indicators: Sharp;Stabbing Pain Intervention(s): Monitored during session    Home Living  Prior Function            PT Goals (current goals can now be found in the care plan section) Acute Rehab PT Goals PT Goal Formulation: With patient Time For Goal Achievement: 09/15/16 Potential to Achieve Goals: Good Progress towards PT goals: Progressing toward goals    Frequency    Min 3X/week      PT Plan Current plan remains appropriate    Co-evaluation             End of Session Equipment Utilized During Treatment: Gait belt Activity Tolerance: Patient tolerated treatment well;Patient limited by pain Patient left: in bed;with call bell/phone within reach;with family/visitor present     Time: 1020-1100 (no charge for time lab getting blood) PT Time Calculation (min) (ACUTE ONLY): 40 min  Charges:   $Therapeutic Activity: 23-37 mins                    G Codes:      Katherine Banks 09/11/2016, 11:08 AM

## 2016-09-11 NOTE — Progress Notes (Signed)
MD on call notified that patient is complaining of chest tightness that she had never experienced before.  EKG, cyclic troponins, and nitroglycerin prn was ordered, and MD notified of results.  Will continue to monitor patient.

## 2016-09-11 NOTE — Progress Notes (Signed)
  Date: 09/11/2016  Patient name: Katherine Banks  Medical record number: [id number]  Date of birth: 1966/06/12   This patient's plan of care was discussed with the house staff. Please see their note for complete details. I concur with their findings. withotu complaints this am.  Had episode of CP this AM. She attributes this to GERD. Improved with going back to sleep.  Normal BM  Blood pressure 128/68, pulse 66, temperature 97.5 F (36.4 C), temperature source Oral, resp. rate 18, height 5\' 6"  (1.676 m), weight 112.9 kg (249 lb), SpO2 96 %. Chest- R chest port is clean, nontender, no fluctuance.  R hip- non-tender, VAC in place.   Labs Troponin (-) ECG NSR 71 bpm. Possible LVH by criteria.   WBC 17  A/P R hip abscess Will continue ceftriaxone Will have her f/u with me in ID clinic  Leukocytosis She does not have sis/x of hospital infection (line, c diff).  Suspect this could be due to drug reaction (ceftriaxone).  D/c planning.   CP- Suspect this is GERD related.   Campbell Riches, MD 09/11/2016, 10:21 AM

## 2016-09-12 ENCOUNTER — Telehealth: Payer: Self-pay | Admitting: Internal Medicine

## 2016-09-12 ENCOUNTER — Inpatient Hospital Stay (HOSPITAL_COMMUNITY): Payer: Medicare Other

## 2016-09-12 DIAGNOSIS — R1031 Right lower quadrant pain: Secondary | ICD-10-CM

## 2016-09-12 DIAGNOSIS — R1011 Right upper quadrant pain: Secondary | ICD-10-CM

## 2016-09-12 LAB — COMPREHENSIVE METABOLIC PANEL
ALK PHOS: 87 U/L (ref 38–126)
ALT: 56 U/L — AB (ref 14–54)
ANION GAP: 12 (ref 5–15)
AST: 60 U/L — ABNORMAL HIGH (ref 15–41)
Albumin: 3 g/dL — ABNORMAL LOW (ref 3.5–5.0)
BILIRUBIN TOTAL: 0.3 mg/dL (ref 0.3–1.2)
BUN: 18 mg/dL (ref 6–20)
CALCIUM: 9.1 mg/dL (ref 8.9–10.3)
CO2: 24 mmol/L (ref 22–32)
CREATININE: 0.87 mg/dL (ref 0.44–1.00)
Chloride: 104 mmol/L (ref 101–111)
GFR calc non Af Amer: >60 mL/min (ref >60)
Glucose, Bld: 131 mg/dL — ABNORMAL HIGH (ref 65–99)
Potassium: 3.5 mmol/L (ref 3.5–5.1)
SODIUM: 140 mmol/L (ref 135–145)
TOTAL PROTEIN: 7.1 g/dL (ref 6.5–8.1)

## 2016-09-12 LAB — BASIC METABOLIC PANEL
Anion gap: 7 (ref 5–15)
BUN: 17 mg/dL (ref 6–20)
CALCIUM: 9.3 mg/dL (ref 8.9–10.3)
CHLORIDE: 106 mmol/L (ref 101–111)
CO2: 27 mmol/L (ref 22–32)
CREATININE: 0.88 mg/dL (ref 0.44–1.00)
GFR calc non Af Amer: >60 mL/min (ref >60)
GLUCOSE: 148 mg/dL — AB (ref 65–99)
Potassium: 3.9 mmol/L (ref 3.5–5.1)
Sodium: 140 mmol/L (ref 135–145)

## 2016-09-12 LAB — CBC
HEMATOCRIT: 31 % — AB (ref 36.0–46.0)
HEMOGLOBIN: 9.3 g/dL — AB (ref 12.0–15.0)
MCH: 23.5 pg — AB (ref 26.0–34.0)
MCHC: 30 g/dL (ref 30.0–36.0)
MCV: 78.3 fL (ref 78.0–100.0)
Platelets: 595 10*3/uL — ABNORMAL HIGH (ref 150–400)
RBC: 3.96 MIL/uL (ref 3.87–5.11)
RDW: 20.4 % — AB (ref 11.5–15.5)
WBC: 20.9 10*3/uL — ABNORMAL HIGH (ref 4.0–10.5)

## 2016-09-12 LAB — SAVE SMEAR

## 2016-09-12 MED ORDER — IOPAMIDOL (ISOVUE-300) INJECTION 61%
INTRAVENOUS | Status: AC
  Filled 2016-09-12: qty 30

## 2016-09-12 NOTE — Progress Notes (Signed)
  Date: 09/12/2016  Patient name: Katherine Banks  Medical record number: [id number]  Date of birth: May 27, 1966   This patient's plan of care was discussed with the house staff. Please see their note for complete details. I concur with their findings. C/o RLQ abd pain.  Blood pressure 132/71, pulse 71, temperature 98.2 F (36.8 C), temperature source Oral, resp. rate 18, height 5\' 6"  (1.676 m), weight 112.9 kg (249 lb), SpO2 98 %. CV- rrr Chest- cta abd- bs+, soft, RLQ tenderness. No r/g.  extr- R hip wound has VAC in place. Clean.   Labs- WBC 20.9  <-- 17.0  A/P Osteomyelitis/Abscess Continue ceftriaxone Appears to be doing well  Abd pain and leukocytosis Will check CT abd/pelvis Check CMP/LFTs No change in anbx for now.   Campbell Riches, MD 09/12/2016, 12:28 PM

## 2016-09-12 NOTE — Care Management Important Message (Signed)
Important Message  Patient Details  Name: Katherine Banks MRN: [id number] Date of Birth: 08/16/1966   Medicare Important Message Given:  Yes    Nathen May 09/12/2016, 10:42 AM

## 2016-09-12 NOTE — Progress Notes (Signed)
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   Now s/p I&D x 2 with wound vac.    PT Comments    Pt moving well and able to perform basic transfers without assist as well as maintain NWB RLE with gait. Pt able to walk in room today but denied increased distance and stated she was weight bearing on RLE PTA to ascend steps. Pt educated for HEP to maintain strength and ROM with encouragement to perform daily. Pt motivated and states she wants to progress back to mod I level.   Follow Up Recommendations  Home health PT;Other (comment)     Equipment Recommendations  None recommended by PT    Recommendations for Other Services       Precautions / Restrictions Precautions Precautions: Fall Precaution Comments: VAC Restrictions Weight Bearing Restrictions: No RLE Weight Bearing: Non weight bearing    Mobility  Bed Mobility               General bed mobility comments: in chair on arrival and end of session  Transfers Overall transfer level: Modified independent                  Ambulation/Gait Ambulation/Gait assistance: Min guard Ambulation Distance (Feet): 20 Feet Assistive device: Rolling walker (2 wheeled) Gait Pattern/deviations: Step-to pattern   Gait velocity interpretation: Below normal speed for age/gender General Gait Details: ambulated in room and denied walking out of room   Stairs            Wheelchair Mobility    Modified Rankin (Stroke Patients Only)       Balance                                     Cognition Arousal/Alertness: Awake/alert Behavior During Therapy: WFL for tasks assessed/performed Overall Cognitive Status: Within Functional Limits for tasks assessed                      Exercises General Exercises - Lower Extremity Long Arc Quad: AROM;AAROM;Right;Left;15 reps;Seated (AROM LLE) Hip Flexion/Marching: AROM;15 reps;Left;Right;Seated;PROM (AROM on LLE) Toe Raises: AROM;AAROM;Right;Left;Seated;15 reps (AROM on LLE)    General Comments        Pertinent Vitals/Pain Pain Score: 4  Pain Location: Rt hip    Home Living                      Prior Function            PT Goals (current goals can now be found in the care plan section) Progress towards PT goals: Progressing toward goals    Frequency           PT Plan Current plan remains appropriate    Co-evaluation             End of Session Equipment Utilized During Treatment: Gait belt Activity Tolerance: Patient tolerated treatment well Patient left: in chair;with call bell/phone within reach     Time: 1016-1032 PT Time Calculation (min) (ACUTE ONLY): 16 min  Charges:  $  Gait Training: 8-22 mins                    G Codes:      Melford Aase 2016-09-13, 12:25 PM  Elwyn Reach, [place]

## 2016-09-12 NOTE — Progress Notes (Signed)
   Subjective: Patient was complaining of right lower quadrant abdominal pain. Denies any nausea or vomiting. Her leg pain is better controlled with her home regimen of opiates.  Objective:  Vital signs in last 24 hours: Vitals:   09/12/16 0419 09/12/16 0843 09/12/16 0941 09/12/16 1416  BP: 132/79 131/68 132/71 122/60  Pulse: 83 69 71 84  Resp: 18   20  Temp: 97.9 F (36.6 C)  98.2 F (36.8 C) 97.8 F (36.6 C)  TempSrc: Oral  Oral Oral  SpO2: 98% 99% 98% 100%  Weight:      Height:       Gen. well-developed lady, alert and oriented, lying comfortably in her bed, in no acute distress. Abdomen. Tender right upper and lower quadrant with positive rebound at right lower quadrant. Bowel sounds positive. Lungs.Clear bilaterally CV. Regular rate and rhythm. Extremities. Clear bandage and wound VAC with minimum drainage was in place on right hip,no erythema or tenderness,was able to dorsiflex and plantarflex her right foot without any pain.  Labs. CBC Latest Ref Rng & Units 09/12/2016 09/11/2016 09/09/2016  WBC 4.0 - 10.5 K/uL 20.9(H) 17.0(H) 14.4(H)  Hemoglobin 12.0 - 15.0 g/dL 9.3(L) 9.0(L) 8.9(L)  Hematocrit 36.0 - 46.0 % 31.0(L) 29.4(L) 28.6(L)  Platelets 150 - 400 K/uL 595(H) 496(H) 432(H)   CMP     Component Value Date/Time   NA 140 09/12/2016 1445   NA 143 05/04/2016 1110   K 3.5 09/12/2016 1445   CL 104 09/12/2016 1445   CO2 24 09/12/2016 1445   GLUCOSE 131 (H) 09/12/2016 1445   BUN 18 09/12/2016 1445   BUN 11 05/04/2016 1110   CREATININE 0.87 09/12/2016 1445   CREATININE 0.82 03/14/2016 1430   CALCIUM 9.1 09/12/2016 1445   PROT 7.1 09/12/2016 1445   PROT 6.8 05/04/2016 1110   ALBUMIN 3.0 (L) 09/12/2016 1445   ALBUMIN 3.7 05/04/2016 1110   AST 60 (H) 09/12/2016 1445   ALT 56 (H) 09/12/2016 1445   ALKPHOS 87 09/12/2016 1445   BILITOT 0.3 09/12/2016 1445   BILITOT 0.2 05/04/2016 1110   GFRNONAA >60 09/12/2016 1445   GFRAA >60 09/12/2016 1445    Assessment/Plan:  50 year old woman with history 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   Right upper and lower quadrant abdominal pain. She is having leukocytosis, her WBC was 20 today, along with thrombocytosis of 595. She has tenderness in both right upper and lower quadrant with rebound in right lower quadrant. CMP shows mildly elevated AST and ALT. Normal Bilirubin. CT abdomen and pelvis is pending.  Right hip osteomyelitis with abscess.Wound VAC was in place.Pain is better control on her home regimen of OxyContin 30 mg twice daily and oxycodone 15 mg every 4 hourly for breakthrough pain. -Continue Rocephin and pain meds.  Hypertension. Her blood pressure improved. She has no complaints with hydrochlorothiazide. Continue amlodipine and hydrochlorothiazide.  Lupus. Continue home dose of hydroxychloroquine and prednisone.  Asthma: Continue albuterol prn and Breo daily.  Anxiety: Continue home duloxetine.  Chronic anemia. Stable. -Continue her home vitamin B12 and ferrous sulfate.  GERD: Continue home PPI as Protonix 40mg  daily.  Dispo: Anticipated discharge in approximately 1 day(s).   Lorella Nimrod, MD 09/12/2016, 4:20 PM Pager: [phone]

## 2016-09-13 ENCOUNTER — Telehealth (INDEPENDENT_AMBULATORY_CARE_PROVIDER_SITE_OTHER): Payer: Self-pay | Admitting: *Deleted

## 2016-09-13 DIAGNOSIS — R928 Other abnormal and inconclusive findings on diagnostic imaging of breast: Secondary | ICD-10-CM

## 2016-09-13 LAB — BASIC METABOLIC PANEL
Anion gap: 9 (ref 5–15)
Anion gap: 10 (ref 5–15)
BUN: 18 mg/dL (ref 6–20)
BUN: 17 mg/dL (ref 6–20)
CALCIUM: 9.1 mg/dL (ref 8.9–10.3)
CALCIUM: 8.8 mg/dL — AB (ref 8.9–10.3)
CO2: 27 mmol/L (ref 22–32)
CO2: 27 mmol/L (ref 22–32)
CREATININE: 0.92 mg/dL (ref 0.44–1.00)
CREATININE: 0.82 mg/dL (ref 0.44–1.00)
Chloride: 102 mmol/L (ref 101–111)
Chloride: 99 mmol/L — ABNORMAL LOW (ref 101–111)
GFR calc non Af Amer: >60 mL/min (ref >60)
GFR calc non Af Amer: >60 mL/min (ref >60)
Glucose, Bld: 129 mg/dL — ABNORMAL HIGH (ref 65–99)
Glucose, Bld: 130 mg/dL — ABNORMAL HIGH (ref 65–99)
Potassium: 5.5 mmol/L — ABNORMAL HIGH (ref 3.5–5.1)
Potassium: 3.1 mmol/L — ABNORMAL LOW (ref 3.5–5.1)
SODIUM: 138 mmol/L (ref 135–145)
SODIUM: 136 mmol/L (ref 135–145)

## 2016-09-13 LAB — CBC
HCT: 30.3 % — ABNORMAL LOW (ref 36.0–46.0)
Hemoglobin: 9.3 g/dL — ABNORMAL LOW (ref 12.0–15.0)
MCH: 24 pg — ABNORMAL LOW (ref 26.0–34.0)
MCHC: 30.7 g/dL (ref 30.0–36.0)
MCV: 78.1 fL (ref 78.0–100.0)
PLATELETS: 576 10*3/uL — AB (ref 150–400)
RBC: 3.88 MIL/uL (ref 3.87–5.11)
RDW: 20.9 % — AB (ref 11.5–15.5)
WBC: 18.6 10*3/uL — ABNORMAL HIGH (ref 4.0–10.5)

## 2016-09-13 MED ORDER — HEPARIN SOD (PORK) LOCK FLUSH 100 UNIT/ML IV SOLN
500.0000 [IU] | INTRAVENOUS | Status: AC | PRN
  Administered 2016-09-13: 500 [IU]

## 2016-09-13 MED ORDER — SODIUM CHLORIDE 0.9% FLUSH: 10.0000 mL | Freq: Two times a day (BID) | INTRAVENOUS | Status: DC

## 2016-09-13 MED ORDER — HYDROMORPHONE HCL 2 MG/ML IJ SOLN
1.0000 mg | Freq: Once | INTRAMUSCULAR | Status: AC
  Administered 2016-09-13: 1 mg via INTRAVENOUS
  Filled 2016-09-13: qty 1

## 2016-09-13 MED ORDER — POTASSIUM CHLORIDE CRYS ER 20 MEQ PO TBCR
40.0000 meq | EXTENDED_RELEASE_TABLET | Freq: Once | ORAL | Status: AC
  Administered 2016-09-13: 40 meq via ORAL
  Filled 2016-09-13: qty 2

## 2016-09-13 MED ORDER — HYDROCHLOROTHIAZIDE 25 MG PO TABS: 25.0000 mg | ORAL_TABLET | Freq: Every day | ORAL | 3 refills | Status: DC

## 2016-09-13 MED ORDER — SODIUM CHLORIDE 0.9% FLUSH
10.0000 mL | INTRAVENOUS | Status: DC | PRN
  Administered 2016-09-13: 10 mL
  Filled 2016-09-13: qty 40

## 2016-09-13 MED ORDER — AMLODIPINE BESYLATE 10 MG PO TABS: 10.0000 mg | ORAL_TABLET | Freq: Every day | ORAL | 3 refills | Status: DC

## 2016-09-13 MED ORDER — ONDANSETRON HCL 4 MG PO TABS: 4.0000 mg | ORAL_TABLET | Freq: Four times a day (QID) | ORAL | 0 refills | Status: AC | PRN

## 2016-09-13 MED ORDER — DEXTROSE 5 % IV SOLN: 2.0000 g | INTRAVENOUS | 41 refills | Status: AC

## 2016-09-13 NOTE — Progress Notes (Signed)
Subjective: Patient was little upset this morning when seen because of an incidence when the nurse came by telling her in the morning that they need to pull up her wound VAC. She wants to be informed by a doctor before hand. We explained to her that this was the original plan by her orthopedic surgeon to take this wound VAC as an outpatient on Tuesday, as you are still in hospital they might have think taken care while you're here. She states that her right sided abdominal pain is better. Denies any nausea or vomiting, shortness of breath or chest pain.  Objective:  Vital signs in last 24 hours: Vitals:   09/12/16 2047 09/13/16 0513 09/13/16 0739 09/13/16 1121  BP: (!) 145/84 (!) 147/93 (!) 160/78 (!) 148/89  Pulse: 93 74 72 88  Resp: 18 18 18    Temp: 98 F (36.7 C) 97.8 F (36.6 C) 98.2 F (36.8 C)   TempSrc: Oral Oral Oral   SpO2: 96% 100% 100%   Weight:      Height:       Gen. well-developed lady, alert and oriented, lying comfortably in her bed, in no acute distress. Extremities.Bandage and wound VAC with minimum drainage was in place on right hip,no erythema or tenderness,was able to dorsiflex and plantarflex her right foot without any pain. Lungs.Clear bilaterally CV. Regular rate and rhythm. Abdomen. Soft, mildly tender right lower quadrant, nondistended bowel sounds positive,.  Labs. CBC Latest Ref Rng & Units 09/13/2016 09/12/2016 09/11/2016  WBC 4.0 - 10.5 K/uL 18.6(H) 20.9(H) 17.0(H)  Hemoglobin 12.0 - 15.0 g/dL 9.3(L) 9.3(L) 9.0(L)  Hematocrit 36.0 - 46.0 % 30.3(L) 31.0(L) 29.4(L)  Platelets 150 - 400 K/uL 576(H) 595(H) 496(H)   BMP Latest Ref Rng & Units 09/13/2016 09/13/2016 09/12/2016  Glucose 65 - 99 mg/dL 130(H) 129(H) 131(H)  BUN 6 - 20 mg/dL 17 18 18   Creatinine 0.44 - 1.00 mg/dL 0.82 0.92 0.87  BUN/Creat Ratio 9 - 23 - - -  Sodium 135 - 145 mmol/L 136 138 140  Potassium 3.5 - 5.1 mmol/L 3.1(L) 5.5(H) 3.5  Chloride 101 - 111 mmol/L 99(L) 102 104    CO2 22 - 32 mmol/L 27 27 24   Calcium 8.9 - 10.3 mg/dL 8.8(L) 9.1 9.1   CT abdomen and Pelvis. FINDINGS: Lower chest: No significant pulmonary nodules or acute consolidative airspace disease. Tip of superior approach central venous catheter terminates at the cavoatrial junction.  Hepatobiliary: Normal liver with no liver mass. Cholecystectomy. No biliary ductal dilatation.  Pancreas: Normal, with no mass or duct dilation.  Spleen: Normal size. No mass.  Adrenals/Urinary Tract: Normal adrenals. No renal stones. No hydronephrosis. No contour deforming renal mass. Normal bladder.  Stomach/Bowel: Grossly normal stomach. Normal caliber small bowel with no small bowel wall thickening. Appendix is not discretely visualized. No pericecal inflammatory changes. Oral contrast progresses to the cecum. Moderate stool throughout the large bowel and rectum. No large bowel wall thickening, colonic diverticulosis or pericolonic fat stranding.  Vascular/Lymphatic: Atherosclerotic nonaneurysmal abdominal aorta. IVC filter is in place below the level of the renal veins. Mildly enlarged 1.0 cm right external iliac lymph node (series 2/ image 82), stable since 07/31/2011. Mildly enlarged 1.2 cm right external iliac node (series 2/ image 82), new since 07/31/2011. No additional pathologically enlarged abdominopelvic nodes.  Reproductive: Status post hysterectomy, with no abnormal findings at the vaginal cuff. No adnexal mass.  Other: No pneumoperitoneum, ascites or intra-abdominal focal fluid collection. Focal 2.6 cm asymmetry in the lateral right  breast (series 2/ image 11).  Musculoskeletal: Expected postsurgical changes in the right hip status post interval incision and drainage. The subcutaneous fluid collection in the posterior lateral right hip seen on the 09/06/2016 CT study has been drained and filled with antibiotic beads with no reaccumulation of the collection. There is a small joint effusion  in the right hip joint with diffuse synovial thickening in the right hip joint, unchanged. Antibiotic beads are seen in the lateral right hip joint space. Postsurgical fat stranding is seen in the anterior right hip soft tissues. There is stable healed deformity in the visualized proximal right femur with prior resection of the right femoral head and neck. There is stable mild erosive change along the proximal right femoral margin consistent with osteomyelitis. 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 Moderate thoracolumbar spondylosis.  IMPRESSION: 1. No evidence of bowel obstruction or acute bowel inflammation. 2. No hydronephrosis. 3. Mild right pelvic lymphadenopathy, nonspecific, probably reactive . 4. Expected postsurgical changes in the right hip status post incision and drainage with antibiotic bead placement. Stable right hip joint effusion and diffuse right hip synovial thickening. Stable findings of osteomyelitis at the proximal right femoral margin. 5. **An incidental finding of potential clinical significance has been found. Focal asymmetry in the lateral right breast, for which outpatient mammographic evaluation is recommended at a women's imaging center.    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   Right upper and lower quadrant abdominal pain. Her pain and leukocytosis both are improving.her CT of abdomen and pelvis was negative for any acute pathology. She can be discharged home on current antibiotic regimen with close follow-up with Dr. [name].  Right hip osteomyelitis with abscess. Wound VAC is to be removed today.she will be discharged home on Rocephin and pain meds.  Hypertension.mildly  elevated again today. Continue amlodipine and hydrochlorothiazide.  Right breast capacity on CT. She needs a follow-up with mammogram as an outpatient.  Lupus. Continue home dose of hydroxychloroquine and prednisone.  Asthma: Continue albuterol prn and Breo daily.  Anxiety: Continue home duloxetine.  Chronic anemia. Stable. -Continue her home vitamin B12 and ferrous sulfate.  GERD: Continue home PPI as Protonix 40mg  daily.  Dispo: Being discharged.  Lorella Nimrod, MD 09/13/2016, 2:38 PM Pager: [phone]

## 2016-09-13 NOTE — Progress Notes (Signed)
  Date: 09/13/2016  Patient name: Katherine Banks  Medical record number: [id number]  Date of birth: November 04, 1965   This patient's plan of care was discussed with the house staff. Please see their note for complete details. I concur with their findings. Discussed with pt regarding her home anbx, her VAC removal today.  She still has some abd pain (better, no r/g). CT abd (-).  WBC better (18.6). K 3.1  NEEDS R breast f/u as outpt.  I will see her in clinic for anbx management and potential eval at [facility].   Campbell Riches, MD 09/13/2016, 11:57 AM

## 2016-09-13 NOTE — Telephone Encounter (Signed)
IC s/w Aldona Bar gave her Dr Randel Pigg number to call.  Patient wants to speak with him directly prior to RN removing the wound vac.

## 2016-09-13 NOTE — Telephone Encounter (Signed)
IC [phone] to the floor 5W, NA, will call again.   Katherine Banks is not at number listed. It was another nurse, not in charge of this patient.

## 2016-09-13 NOTE — Progress Notes (Addendum)
Per Dr. Marlou Sa order to pull wound vac today. Patient requested to speak to doctor first. Betha Loa Tarun Patchell, RN  10:45 Dr.Dean in surgery all day, OR nurse took message for him to come speak to her when he can.  11:49 Patient agreed to allow wound care nurse to remove wound vac. Wound care nurse notified.

## 2016-09-13 NOTE — Anesthesia Preprocedure Evaluation (Signed)
Anesthesia Evaluation  Patient identified by MRN, date of birth, ID band Patient awake    Reviewed: Allergy & Precautions, NPO status , Patient's Chart, lab work & pertinent test results  Airway Mallampati: II  TM Distance: >3 FB Neck ROM: Full    Dental  (+) Teeth Intact   Pulmonary asthma , former smoker,    breath sounds clear to auscultation       Cardiovascular hypertension,  Rhythm:Regular Rate:Normal     Neuro/Psych  Headaches, Seizures -,     GI/Hepatic GERD  ,  Endo/Other    Renal/GU      Musculoskeletal   Abdominal (+) + obese,   Peds  Hematology   Anesthesia Other Findings   Reproductive/Obstetrics                             Anesthesia Physical Anesthesia Plan  ASA: III  Anesthesia Plan: General   Post-op Pain Management:    Induction: Intravenous  Airway Management Planned: LMA  Additional Equipment:   Intra-op Plan:   Post-operative Plan: Extubation in OR  Informed Consent: I have reviewed the patients History and Physical, chart, labs and discussed the procedure including the risks, benefits and alternatives for the proposed anesthesia with the patient or authorized representative who has indicated his/her understanding and acceptance.     Plan Discussed with: CRNA  Anesthesia Plan Comments:         Anesthesia Quick Evaluation

## 2016-09-13 NOTE — Discharge Summary (Signed)
Name: Katherine Banks MRN: [id number] DOB: August 18, 1966 50 y.o. PCP: [name], MD  Date of Admission: 09/06/2016  1:19 AM Date of Discharge: 09/13/2016 Attending Physician: Campbell Riches, MD  Discharge Diagnosis: 1. Osteomyelitis of right hip. 2. Hypertension 3. Lupus 4. Asthma  Discharge Medications:   Medication List    STOP taking these medications   dicyclomine 10 MG capsule Commonly known as:  BENTYL   furosemide 20 MG tablet Commonly known as:  LASIX     TAKE these medications   albuterol (2.5 MG/3ML) 0.083% nebulizer solution Commonly known as:  PROVENTIL Take 3 mLs (2.5 mg total) by nebulization every 8 (eight) hours. What changed:  when to take this  reasons to take this   amLODipine 10 MG tablet Commonly known as:  NORVASC Take 1 tablet (10 mg total) by mouth daily. Start taking on:  09/14/2016   cefTRIAXone 2 g in dextrose 5 % 50 mL Inject 2 g into the vein daily.   cyanocobalamin 500 MCG tablet Take 1 tablet (500 mcg total) by mouth daily.   cyclobenzaprine 10 MG tablet Commonly known as:  FLEXERIL Take 1 tablet (10 mg total) by mouth daily as needed for muscle spasms.   diclofenac sodium 1 % Gel Commonly known as:  VOLTAREN Apply 4 g topically 4 (four) times daily -  before meals and at bedtime.   DULoxetine 30 MG capsule Commonly known as:  CYMBALTA Take 1 capsule (30 mg total) by mouth daily.   ferrous sulfate 325 (65 FE) MG tablet Take 1 tablet (325 mg total) by mouth daily.   fluticasone 50 MCG/ACT nasal spray Commonly known as:  FLONASE Place 1 spray into both nostrils daily. What changed:  how much to take  when to take this  reasons to take this   fluticasone furoate-vilanterol 200-25 MCG/INH Aepb Commonly known as:  BREO ELLIPTA Inhale 1 puff into the lungs daily.   gabapentin 400 MG capsule Commonly known as:  NEURONTIN TAKE 2 CAPSULES BY MOUTH 3 TIMES A DAY   hydrochlorothiazide 25 MG tablet Commonly  known as:  HYDRODIURIL Take 1 tablet (25 mg total) by mouth daily. Start taking on:  09/14/2016   hydroxychloroquine 200 MG tablet Commonly known as:  PLAQUENIL Take 1 tablet (200 mg total) by mouth 2 (two) times daily.   lidocaine 5 % Commonly known as:  LIDODERM Place 1 patch onto the skin daily. Remove & Discard patch within 12 hours or as directed by MD   montelukast 10 MG tablet Commonly known as:  SINGULAIR Take 10 mg by mouth daily. Reported on 03/14/2016   naproxen 500 MG tablet Commonly known as:  NAPROSYN Take 1 tablet (500 mg total) by mouth 2 (two) times daily with a meal.   nystatin ointment Commonly known as:  MYCOSTATIN Apply topically 2 (two) times daily.   omeprazole 20 MG capsule Commonly known as:  PRILOSEC TAKE ONE CAPSULE BY MOUTH TWICE A DAY BEFORE A MEAL   ondansetron 4 MG tablet Commonly known as:  ZOFRAN Take 1 tablet (4 mg total) by mouth every 6 (six) hours as needed for nausea.   oxyCODONE 30 MG 12 hr tablet Take 30 mg by mouth every 12 (twelve) hours.   oxyCODONE 15 MG immediate release tablet Commonly known as:  ROXICODONE Take 1 tablet (15 mg total) by mouth every 4 (four) hours as needed for severe pain.   predniSONE 5 MG tablet Commonly known as:  DELTASONE Take 5 mg by  mouth 4 (four) times daily.   zolpidem 10 MG tablet Commonly known as:  AMBIEN Take 10 mg by mouth at bedtime as needed for sleep.       Disposition and follow-up:   [name] was discharged from [facility] in Stable condition.  At the hospital follow up visit please address:  1.  Her blood pressure, her right hip pain.  2.  Labs / imaging needed at time of follow-up: BMP and CBC  3.  Pending labs/ test needing follow-up: None  Follow-up Appointments: Follow-up Information    [facility] Follow up.   Specialty:  [place] Why:  [place] will provided IV antibiotics [phone] Contact  information: [address] [place] [address] [phone]        [name], MD. Schedule an appointment as soon as possible for a visit.   Specialty:  Internal Medicine Why:  Please call and make an follow-up appointment with the next one to 2 weeks. You need a mammogram. 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  Right hip osteomyelitis with abscess. In ED she was afebrile with WBC of 11. Her CT hip was positive for Loculated fluid collections around the right hip with a large collection extending laterally towards the skin surface consistent with abscesses. Soft tissue stranding and skin thickening consistent with cellulitis.Probable sinus tracts to the skin surface anteriorly and laterally. She had her incision and drainage along with wound VAC was done by orthopedic surgery. She was started on Rocephin and vancomycin. Her wound culture grew strep agalactia, vancomycin was stopped. Rocephin was continued. She needs total 6 weeks of Rocephin. Wound VAC was removed on November 14 ,2017. She will follow-up with Dr. [name] at clinic for her infection. She will continue to follow-up with pain management, for control of her pain. She will continue to follow-up with wound care.  Hypertension. Initially she has softer blood pressures, her home antihypertensives were held. Later when she started developing elevated blood pressures. She was started first on amlodipine, then hydrochlorothiazide was added. She had improved blood pressure on discharge.  She needs monitoring to titrate her antihypertensives. She told us that she was on clonidine 0.2 mg twice daily at home, as she was unable to tolerate Exforge which is a combination of amlodipine, valsartan and hydrochlorothiazide, because of nausea and vomiting. We tried to separate all of them, found that she developed nausea and vomiting with valsartan. She tolerated amlodipine and hydrochlorothiazide very well. Clonidine was never restarted.  Lupus. No Lupus flareup suspected, she was continue on home dose of hydroxychloroquine and prednisone.  GERD: Continue home PPI as Protonix 40mg  daily.  Chronic anemia: Most probably due to anemia of chronic disease. Her hemoglobin this morning was 8.7 after surgery,close to her baseline of 9. -Continue her home vitamin B12 and ferrous sulfate.  Asthma: Continue albuterol prn and Breo daily.  Anxiety: Continue home duloxetine.  Discharge Vitals:   BP (!) 148/89 (BP Location: Left Arm)   Pulse 88   Temp 98.2  F (36.8 C) (Oral)   Resp 18   Ht 5\' 6"  (1.676 m)   Wt 249 lb (112.9 kg)   SpO2 100%   BMI 40.19 kg/m   Gen. well-developed lady, alert and oriented, lying comfortably in her bed, in no acute distress. Extremities.Bandage and wound VAC with minimum drainage was in place on right hip,no erythema or tenderness,was able to dorsiflex and plantarflex her right foot without any pain. Lungs.Clear bilaterally CV. Regular rate and rhythm. Abdomen. Soft, mildly tender right lower quadrant, nondistended bowel sounds positive,.  Pertinent Labs, Studies, and Procedures:  CBC Latest Ref Rng & Units 09/13/2016 09/12/2016 09/11/2016  WBC 4.0 - 10.5 K/uL 18.6(H) 20.9(H) 17.0(H)  Hemoglobin 12.0 - 15.0 g/dL 9.3(L) 9.3(L) 9.0(L)  Hematocrit 36.0 - 46.0 % 30.3(L) 31.0(L) 29.4(L)  Platelets 150 - 400 K/uL 576(H) 595(H) 496(H)   CMP Latest Ref Rng & Units 09/13/2016 09/13/2016 09/12/2016  Glucose 65 - 99 mg/dL 130(H) 129(H) 131(H)  BUN 6 - 20  mg/dL 17 18 18   Creatinine 0.44 - 1.00 mg/dL 0.82 0.92 0.87  Sodium 135 - 145 mmol/L 136 138 140  Potassium 3.5 - 5.1 mmol/L 3.1(L) 5.5(H) 3.5  Chloride 101 - 111 mmol/L 99(L) 102 104  CO2 22 - 32 mmol/L 27 27 24   Calcium 8.9 - 10.3 mg/dL 8.8(L) 9.1 9.1  Total Protein 6.5 - 8.1 g/dL - - 7.1  Total Bilirubin 0.3 - 1.2 mg/dL - - 0.3  Alkaline Phos 38 - 126 U/L - - 87  AST 15 - 41 U/L - - 60(H)  ALT 14 - 54 U/L - - 56(H)   Aerobic/Anaerobic Culture (surgical/deep wound)  Order: [id number]  Status:  Preliminary result Visible to patient:  No (Not Released) Next appt:  None   4d ago  Specimen Description HIP WOUND   Special Requests RIGHT SWABS OF DEEP SOFT TISSUE   Gram Stain RARE WBC PRESENT, PREDOMINANTLY PMN  NO ORGANISMS SEEN  Gram Stain Report Called to,Read Back By and Verified With: M. Genevie Ann, PACU RN AT 2008 ON 09/06/16 BY C. JESSUP, MLT.      Culture RARE GROUP B STREP(S.AGALACTIAE)ISOLATED NO ANAEROBES ISOLATED; CULTURE IN PROGRESS FOR 5 DAYS   Report Status PENDING   Organism ID, Bacteria GROUP B STREP(S.AGALACTIAE)ISOLATED   Resulting Agency [facility]  Susceptibility          Group b strep(s.agalactiae)isolated    MIC    AMPICILLIN <=0.25 SENSITIVE "><=0.25 SENS... Sensitive    CEFTRIAXONE <=0.12 SENSITIVE "><=0.12 SENS... Sensitive    CLINDAMYCIN >=1 RESISTANT  Resistant    ERYTHROMYCIN >=8 RESISTANT  Resistant    LEVOFLOXACIN 1 SENSITIVE  Sensitive    VANCOMYCIN 0.5 SENSITIVE  Sensitive         Susceptibility Comments   Group b strep(s.agalactiae)isolated  RARE GROUP B STREP(S.AGALACTIAE)ISOLATED          CT right Hip. FINDINGS: Previous resection or resorption of the right femoral head with heterotopic ossification lateral to the right proximal femur. Bone deformity and periosteal callus formation around the proximal femur. This could represent postoperative change or changes due to chronic osteomyelitis. The right  hemipelvis and acetabulum appear intact. Incidental note of changes of avascular necrosis in the left hip.  Right hip effusion with soft tissue infiltration in the previous face occupied by the femoral head. Fluid collection extending laterally from the right hip towards the skin surface and measuring up to 4.8 x 9.5 cm. Additional loculated collections anterior and medial to the right hip.  These changes likely to represent abscesses. Infiltration in the subcutaneous fat around the right hip with diffuse skin thickening consistent with cellulitis. Soft tissue defect anterior to the right hip likely to represent a sinus tract.  Prominent lymph nodes in the right groin and right iliac region likely representing reactive nodes.  IMPRESSION: Previous resection or resorption of the right femoral head and neck with callus formation around the proximal right femur consistent with postoperative change or chronic osteomyelitis. Loculated fluid collections around the right hip with a large collection extending laterally towards the skin surface consistent with abscesses. Soft tissue stranding and skin thickening consistent with cellulitis. Probable sinus tracts to the skin surface anteriorly and laterally. Right groin and right pelvic lymphadenopathy is likely reactive. Incidental note of changes of left hip avascular necrosis.   CT abdomen and Pelvis. FINDINGS: Lower chest: No significant pulmonary nodules or acute consolidative airspace disease. Tip of superior approach central venous catheter terminates at the cavoatrial junction.  Hepatobiliary: Normal liver with no liver mass. Cholecystectomy. No biliary ductal dilatation.  Pancreas: Normal, with no mass or duct dilation.  Spleen: Normal size. No mass.  Adrenals/Urinary Tract: Normal adrenals. No renal stones. No hydronephrosis. No contour deforming renal mass. Normal bladder.  Stomach/Bowel: Grossly normal stomach. Normal caliber small  bowel with no small bowel wall thickening. Appendix is not discretely visualized. No pericecal inflammatory changes. Oral contrast progresses to the cecum. Moderate stool throughout the large bowel and rectum. No large bowel wall thickening, colonic diverticulosis or pericolonic fat stranding.  Vascular/Lymphatic: Atherosclerotic nonaneurysmal abdominal aorta. IVC filter is in place below the level of the renal veins. Mildly enlarged 1.0 cm right external iliac lymph node (series 2/ image 82), stable since 07/31/2011. Mildly enlarged 1.2 cm right external iliac node (series 2/ image 82), new since 07/31/2011. No additional pathologically enlarged abdominopelvic nodes.  Reproductive: Status post hysterectomy, with no abnormal findings at the vaginal cuff. No adnexal mass.  Other: No pneumoperitoneum, ascites or intra-abdominal focal fluid collection. Focal 2.6 cm asymmetry in the lateral right breast (series 2/ image 11).  Musculoskeletal: Expected postsurgical changes in the right hip status post interval incision and drainage. The subcutaneous fluid collection in the posterior lateral right hip seen on the 09/06/2016 CT study has been drained and filled with antibiotic beads with no reaccumulation of the collection. There is a small joint effusion in the right hip joint with diffuse synovial thickening in the right hip joint, unchanged. Antibiotic beads are seen in the lateral right hip joint space. Postsurgical fat stranding is seen in the anterior right hip soft tissues. There is stable healed deformity in the visualized proximal right femur with prior resection of the right femoral head and neck. There is stable mild erosive change along the proximal right femoral margin consistent with osteomyelitis. 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 Moderate thoracolumbar spondylosis.  IMPRESSION: 1. No evidence of bowel obstruction or acute bowel inflammation. 2. No hydronephrosis. 3. Mild right  pelvic lymphadenopathy, nonspecific, probably reactive . 4. Expected postsurgical changes in the right hip status post incision and drainage with antibiotic bead placement. Stable right hip joint effusion and diffuse right hip synovial thickening. Stable findings of osteomyelitis at the proximal right femoral margin. 5. **An incidental finding of potential clinical significance has been found. Focal asymmetry in the lateral right breast, for which outpatient mammographic evaluation is recommended at a women's imaging center.     Discharge Instructions: Discharge Instructions    Diet - low sodium heart  healthy    Complete by:  As directed    Discharge instructions    Complete by:  As directed    It was pleasure taking care of you. Please make an appointment with your orthopedic surgeon, Dr. [name] and with your primary care. You need and mammogram as soon as possible.   Increase activity slowly    Complete by:  As directed       Signed: Lorella Nimrod, MD 09/13/2016, 3:15 PM   Pager: [phone]

## 2016-09-13 NOTE — Consult Note (Addendum)
[place] Nurse wound consult note Reason for Consult: Consult requested to discontinue Prevena negative pressure device.  Pt has been followed by the ortho service for assessment and plan of care to right hip post-op wound. Wound type: Full thickness Wound bed: Removed Prevena dressing and incision line to right hip is well-approximated with sutures intact, scant amt yellow drainage, no odor. Dressing procedure/placement/frequency: Applied silver post-op dressing to incision line.  Pt can follow-up with ortho service after discharge. Please re-consult if further assistance is needed.  Thank-you,  Julien Girt MSN, [place], [place], [place], [place]

## 2016-09-13 NOTE — Telephone Encounter (Signed)
Pt nurse called from [facility] stating she has the order to pull the wound vac but pt wanted to speak to the Dr. Before she does this.

## 2016-09-13 NOTE — Care Management Note (Signed)
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 Resides with mom.  PCP: [name]  Action/Plan: Plan is to d/c to home with home health services.  Expected Discharge Date:     09/13/2016            Expected Discharge Plan:  [place] (Resides with mom)  In-House Referral:  NA  Discharge planning Services  CM Consult  Post Acute Care Choice:  Home Health Choice offered to:  Patient  DME Arranged:  IV pump/equipment (Referral made with [facility] @ [phone] for IV ABX infusion) DME Agency:  [place]  HH Arranged:  RN, IV Antibiotics HH Agency:  [facility] (Referral made with [facility] @ [phone] for home health RN)  Status of Service:  completed  If discussed at [place] of Stay Meetings, dates discussed:    Additional Comments:  Sharin Mons, RN 09/13/2016, 3:12 PM

## 2016-09-15 ENCOUNTER — Encounter (INDEPENDENT_AMBULATORY_CARE_PROVIDER_SITE_OTHER): Payer: Self-pay | Admitting: Orthopedic Surgery

## 2016-09-15 ENCOUNTER — Ambulatory Visit (INDEPENDENT_AMBULATORY_CARE_PROVIDER_SITE_OTHER): Payer: Medicare Other | Admitting: Orthopedic Surgery

## 2016-09-15 DIAGNOSIS — M869 Osteomyelitis, unspecified: Secondary | ICD-10-CM
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
## 2016-09-16 DIAGNOSIS — M86151 Other acute osteomyelitis, right femur: Secondary | ICD-10-CM

## 2016-09-20 ENCOUNTER — Other Ambulatory Visit: Payer: Self-pay | Admitting: Oncology

## 2016-09-20 ENCOUNTER — Telehealth: Payer: Self-pay

## 2016-09-20 NOTE — Telephone Encounter (Signed)
Gracie , Physical Therapist with [facility] is calling to report patient is not ready to start physical therapy due to not able to bear weight on R hip .    Physical therapy will call once services are started.  Laverle Patter, RN

## 2016-09-21 ENCOUNTER — Encounter (INDEPENDENT_AMBULATORY_CARE_PROVIDER_SITE_OTHER): Payer: Self-pay | Admitting: Orthopedic Surgery

## 2016-09-21 ENCOUNTER — Ambulatory Visit (INDEPENDENT_AMBULATORY_CARE_PROVIDER_SITE_OTHER): Payer: Medicare Other | Admitting: Orthopedic Surgery

## 2016-09-21 DIAGNOSIS — M869 Osteomyelitis, unspecified: Secondary | ICD-10-CM
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
## 2016-09-26 ENCOUNTER — Encounter: Payer: Self-pay | Admitting: Internal Medicine

## 2016-09-27 ENCOUNTER — Ambulatory Visit (INDEPENDENT_AMBULATORY_CARE_PROVIDER_SITE_OTHER): Payer: Medicare Other | Admitting: Internal Medicine

## 2016-09-27 ENCOUNTER — Encounter: Payer: Self-pay | Admitting: Internal Medicine

## 2016-09-27 DIAGNOSIS — M009 Pyogenic arthritis, unspecified: Secondary | ICD-10-CM | POA: Diagnosis not present

## 2016-09-27 DIAGNOSIS — Z8542 Personal history of malignant neoplasm of other parts of uterus: Secondary | ICD-10-CM | POA: Diagnosis present
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
## 2016-09-27 NOTE — Assessment & Plan Note (Addendum)
She has chronic recurrent right hip infection with femoral osteomyelitis. Apparently she has refused hip disarticulation and amputation. Dr. Randel Pigg recent office note indicates that he will be arranging referral to a tertiary care center for more definitive therapy. I plan on 3 more weeks of IV ceftriaxone then considering conversion to oral amoxicillin. She will follow up here in 3 weeks.  I did not find documentation of a recent right breast mass. However, if she does have a mass and needs a biopsy I feel it can be done safely at any time.

## 2016-09-30 NOTE — Telephone Encounter (Signed)
IC and s/w Elmyra Ricks and advised ok to do.

## 2016-09-30 NOTE — Telephone Encounter (Signed)
09/30/16 CSW followed up with agency to inquire if contact had been made.  Agency states multiple voice mails have been left with no return calls.

## 2016-09-30 NOTE — Telephone Encounter (Signed)
Patient called and said that nicole with [facility] needs an order for a social worker to come to patient home because it is not handicap accessible.   Cb# [phone] (patient)  Elmyra Ricks with [facility] # [phone]

## 2016-10-03 ENCOUNTER — Encounter: Payer: Self-pay | Admitting: Internal Medicine

## 2016-10-03 ENCOUNTER — Telehealth: Payer: Self-pay

## 2016-10-03 ENCOUNTER — Encounter: Payer: Medicare Other | Admitting: Internal Medicine

## 2016-10-04 ENCOUNTER — Encounter: Payer: Self-pay | Admitting: Internal Medicine

## 2016-10-05 ENCOUNTER — Other Ambulatory Visit: Payer: Self-pay | Admitting: Internal Medicine

## 2016-10-06 ENCOUNTER — Telehealth (INDEPENDENT_AMBULATORY_CARE_PROVIDER_SITE_OTHER): Payer: Self-pay

## 2016-10-06 NOTE — Telephone Encounter (Signed)
Tiffany with [facility] called wanting to know if patient needed to use something else for her wound?  States that wound is draining a lot, so dressing has to be changed everyday instead of every other day.  NO fever, redness, or odor, but having increased pain posterior to wound. Tiffany call back number is [phone].

## 2016-10-06 NOTE — Telephone Encounter (Signed)
Please advise 

## 2016-10-06 NOTE — Telephone Encounter (Signed)
Send this to Dr. Marlou Sa

## 2016-10-07 NOTE — Telephone Encounter (Signed)
Change dressing daily until return office visit

## 2016-10-07 NOTE — Telephone Encounter (Signed)
Called tiffany to advise, [facility]

## 2016-10-10 ENCOUNTER — Telehealth: Payer: Self-pay | Admitting: *Deleted

## 2016-10-10 NOTE — Telephone Encounter (Signed)
[place], Jonelle Sidle, called asking for an order for UA/Culture for urinary symptoms.  RN spoke with Dr. Megan Salon.  Obtained order for UA/Culture to be obtained.  Results to be faxed to Dr. Megan Salon at [phone].  Phone call to Jonelle Sidle, RN to give verbal order for lab test.  Tiffany repeated back the order.

## 2016-10-13 ENCOUNTER — Telehealth (INDEPENDENT_AMBULATORY_CARE_PROVIDER_SITE_OTHER): Payer: Self-pay | Admitting: Orthopedic Surgery

## 2016-10-13 NOTE — Telephone Encounter (Signed)
Katherine Banks, patient has a 9am appt with Scientist, physiological tomorrow made by Caryl Pina around 11am.  I called patient to try and get her in today, but she states that it too quick of a notice for SCAT. For now she plans to keep appt tomorrow.   She did say that if it got any worse to wear she could no longer tolerate the pain, she'll will go to the emergency dept.

## 2016-10-13 NOTE — Telephone Encounter (Signed)
Can you call patient and get her in here today to look at this?

## 2016-10-14 ENCOUNTER — Encounter (INDEPENDENT_AMBULATORY_CARE_PROVIDER_SITE_OTHER): Payer: Self-pay | Admitting: Orthopedic Surgery

## 2016-10-14 ENCOUNTER — Encounter (INDEPENDENT_AMBULATORY_CARE_PROVIDER_SITE_OTHER): Payer: Self-pay

## 2016-10-14 ENCOUNTER — Ambulatory Visit (INDEPENDENT_AMBULATORY_CARE_PROVIDER_SITE_OTHER): Payer: Medicare Other | Admitting: Orthopedic Surgery

## 2016-10-14 DIAGNOSIS — Z96649 Presence of unspecified artificial hip joint: Secondary | ICD-10-CM

## 2016-10-14 DIAGNOSIS — T8459XS Infection and inflammatory reaction due to other internal joint prosthesis, sequela: Secondary | ICD-10-CM
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
## 2016-10-14 NOTE — Addendum Note (Signed)
Addended by: Daylene Posey T on: 10/14/2016 09:16 AM   Modules accepted: Orders

## 2016-10-17 ENCOUNTER — Ambulatory Visit (HOSPITAL_COMMUNITY): Admission: RE | Admit: 2016-10-17 | Payer: Medicare Other | Source: Ambulatory Visit

## 2016-10-17 ENCOUNTER — Encounter: Payer: Self-pay | Admitting: Internal Medicine

## 2016-10-17 ENCOUNTER — Ambulatory Visit: Payer: Medicare Other | Admitting: Internal Medicine

## 2016-10-17 ENCOUNTER — Telehealth (INDEPENDENT_AMBULATORY_CARE_PROVIDER_SITE_OTHER): Payer: Self-pay | Admitting: Orthopedic Surgery

## 2016-10-17 NOTE — Telephone Encounter (Signed)
Pt aware can call facility and reschedule appt

## 2016-10-17 NOTE — Telephone Encounter (Signed)
Pt requesting if we could reschedule her appt for an x-ray she had today? She stated Dr. Marlou Sa set up appt at [facility] long for her x-rays today but she missed the appt. Please advise. [phone]

## 2016-10-17 NOTE — Telephone Encounter (Signed)
See note from patient

## 2016-10-19 ENCOUNTER — Ambulatory Visit (HOSPITAL_COMMUNITY)
Admission: RE | Admit: 2016-10-19 | Discharge: 2016-10-19 | Disposition: A | Discharge status: Home / Self Care | Payer: Medicare Other | Source: Ambulatory Visit | Attending: Orthopedic Surgery | Admitting: Orthopedic Surgery

## 2016-10-19 ENCOUNTER — Ambulatory Visit (INDEPENDENT_AMBULATORY_CARE_PROVIDER_SITE_OTHER): Payer: Medicare Other | Admitting: Orthopedic Surgery

## 2016-10-19 ENCOUNTER — Encounter (HOSPITAL_COMMUNITY): Payer: Self-pay

## 2016-10-19 DIAGNOSIS — M9689 Other intraoperative and postprocedural complications and disorders of the musculoskeletal system: Secondary | ICD-10-CM | POA: Diagnosis not present

## 2016-10-19 DIAGNOSIS — Z96649 Presence of unspecified artificial hip joint: Secondary | ICD-10-CM

## 2016-10-19 DIAGNOSIS — X58XXXS Exposure to other specified factors, sequela: Secondary | ICD-10-CM | POA: Insufficient documentation

## 2016-10-19 DIAGNOSIS — Z96641 Presence of right artificial hip joint: Secondary | ICD-10-CM | POA: Insufficient documentation

## 2016-10-19 DIAGNOSIS — T8459XS Infection and inflammatory reaction due to other internal joint prosthesis, sequela: Secondary | ICD-10-CM | POA: Diagnosis present

## 2016-10-19 DIAGNOSIS — Z9889 Other specified postprocedural states: Secondary | ICD-10-CM | POA: Diagnosis not present

## 2016-10-19 MED ORDER — IOPAMIDOL (ISOVUE-300) INJECTION 61%
INTRAVENOUS | Status: AC
  Filled 2016-10-19: qty 100

## 2016-10-19 MED ORDER — IOPAMIDOL (ISOVUE-300) INJECTION 61%
100.0000 mL | Freq: Once | INTRAVENOUS | Status: AC | PRN
  Administered 2016-10-19: 100 mL via INTRAVENOUS

## 2016-10-19 NOTE — Addendum Note (Signed)
Addended by: Daylene Posey T on: 10/19/2016 09:40 AM   Modules accepted: Orders

## 2016-10-20 NOTE — Telephone Encounter (Addendum)
Dr. Megan Salon asking about culture for UA obtained 10/18/16.  UA results have been reviewed by Dr Megan Salon.  Requested that Tiffany, [facility] RN, call [facility] to let us know if the urine culture was ordered and being processed.

## 2016-10-21 ENCOUNTER — Other Ambulatory Visit: Payer: Medicare Other

## 2016-10-21 ENCOUNTER — Telehealth (INDEPENDENT_AMBULATORY_CARE_PROVIDER_SITE_OTHER): Payer: Self-pay | Admitting: Radiology

## 2016-10-21 ENCOUNTER — Ambulatory Visit (INDEPENDENT_AMBULATORY_CARE_PROVIDER_SITE_OTHER): Payer: Medicare Other | Admitting: Orthopedic Surgery

## 2016-10-21 NOTE — Telephone Encounter (Signed)
IC patient and LM to call me, she missed 815am appt this morning, we need to get her in to be seen, PLEASE GET ME IF PATIENT CALLS, DO NOT SEND TO MY VOICE MAIL-

## 2016-10-21 NOTE — Telephone Encounter (Addendum)
Jonelle Sidle, [facility] RN returned call.  [facility] did NOT process the urine sample for Culture after UA was collected.  Tiffany, [facility] RN will collect another urine specimen for culture at next home visit.

## 2016-10-25 ENCOUNTER — Encounter: Payer: Self-pay | Admitting: Internal Medicine

## 2016-10-26 ENCOUNTER — Other Ambulatory Visit: Payer: Self-pay | Admitting: Student in an Organized Health Care Education/Training Program

## 2016-10-28 ENCOUNTER — Telehealth (INDEPENDENT_AMBULATORY_CARE_PROVIDER_SITE_OTHER): Payer: Self-pay

## 2016-10-28 NOTE — Telephone Encounter (Signed)
Tiffany from [facility] called, stating she went to patients home today and she had fallen earlier. She has a new puncture wound at the healing wound on the hip. She states its about 3cm long and about 1/2 cm wide. She did advise her to go to the ER to have this evaluated due to the amount of blood. FYI---- This is a Scientist, physiological patient

## 2016-10-29 ENCOUNTER — Emergency Department (HOSPITAL_COMMUNITY)
Admission: EM | Admit: 2016-10-29 | Discharge: 2016-10-29 | Disposition: A | Discharge status: Home / Self Care | Payer: Medicare Other | Attending: Emergency Medicine | Admitting: Emergency Medicine

## 2016-10-29 ENCOUNTER — Emergency Department (HOSPITAL_COMMUNITY): Payer: Medicare Other

## 2016-10-29 ENCOUNTER — Encounter (HOSPITAL_COMMUNITY): Payer: Self-pay | Admitting: Emergency Medicine

## 2016-10-29 DIAGNOSIS — J45909 Unspecified asthma, uncomplicated: Secondary | ICD-10-CM | POA: Diagnosis not present

## 2016-10-29 DIAGNOSIS — R7989 Other specified abnormal findings of blood chemistry: Secondary | ICD-10-CM

## 2016-10-29 DIAGNOSIS — Z23 Encounter for immunization: Secondary | ICD-10-CM | POA: Diagnosis not present

## 2016-10-29 DIAGNOSIS — T148XXA Other injury of unspecified body region, initial encounter: Secondary | ICD-10-CM

## 2016-10-29 DIAGNOSIS — Y658 Other specified misadventures during surgical and medical care: Secondary | ICD-10-CM | POA: Diagnosis not present

## 2016-10-29 DIAGNOSIS — Z87891 Personal history of nicotine dependence: Secondary | ICD-10-CM | POA: Diagnosis not present

## 2016-10-29 DIAGNOSIS — Z96641 Presence of right artificial hip joint: Secondary | ICD-10-CM | POA: Diagnosis not present

## 2016-10-29 DIAGNOSIS — T8130XA Disruption of wound, unspecified, initial encounter: Secondary | ICD-10-CM | POA: Diagnosis present

## 2016-10-29 DIAGNOSIS — Z8541 Personal history of malignant neoplasm of cervix uteri: Secondary | ICD-10-CM | POA: Diagnosis not present

## 2016-10-29 DIAGNOSIS — I10 Essential (primary) hypertension: Secondary | ICD-10-CM | POA: Insufficient documentation

## 2016-10-29 DIAGNOSIS — Z91040 Latex allergy status: Secondary | ICD-10-CM | POA: Insufficient documentation

## 2016-10-29 LAB — BASIC METABOLIC PANEL
ANION GAP: 10 (ref 5–15)
BUN: 28 mg/dL — ABNORMAL HIGH (ref 6–20)
CALCIUM: 9 mg/dL (ref 8.9–10.3)
CO2: 24 mmol/L (ref 22–32)
Chloride: 103 mmol/L (ref 101–111)
Creatinine, Ser: 1.98 mg/dL — ABNORMAL HIGH (ref 0.44–1.00)
GFR calc Af Amer: 32 mL/min — ABNORMAL LOW (ref >60)
GFR, EST NON AFRICAN AMERICAN: 28 mL/min — AB (ref >60)
GLUCOSE: 130 mg/dL — AB (ref 65–99)
Potassium: 4.6 mmol/L (ref 3.5–5.1)
Sodium: 137 mmol/L (ref 135–145)

## 2016-10-29 LAB — CBC WITH DIFFERENTIAL/PLATELET
BASOS ABS: 0 10*3/uL (ref 0.0–0.1)
Basophils Relative: 0 %
EOS ABS: 0.4 10*3/uL (ref 0.0–0.7)
EOS PCT: 4 %
HCT: 28.9 % — ABNORMAL LOW (ref 36.0–46.0)
Hemoglobin: 8.9 g/dL — ABNORMAL LOW (ref 12.0–15.0)
LYMPHS ABS: 3.1 10*3/uL (ref 0.7–4.0)
Lymphocytes Relative: 28 %
MCH: 22.6 pg — ABNORMAL LOW (ref 26.0–34.0)
MCHC: 30.8 g/dL (ref 30.0–36.0)
MCV: 73.4 fL — AB (ref 78.0–100.0)
MONO ABS: 0.7 10*3/uL (ref 0.1–1.0)
Monocytes Relative: 6 %
Neutro Abs: 6.7 10*3/uL (ref 1.7–7.7)
Neutrophils Relative %: 62 %
PLATELETS: 378 10*3/uL (ref 150–400)
RBC: 3.94 MIL/uL (ref 3.87–5.11)
RDW: 18.9 % — AB (ref 11.5–15.5)
WBC: 10.9 10*3/uL — AB (ref 4.0–10.5)

## 2016-10-29 LAB — I-STAT BETA HCG BLOOD, ED (MC, WL, AP ONLY): I-stat hCG, quantitative: <5 m[IU]/mL (ref <5)

## 2016-10-29 MED ORDER — SODIUM CHLORIDE 0.9 % IV BOLUS (SEPSIS)
1000.0000 mL | Freq: Once | INTRAVENOUS | Status: AC
  Administered 2016-10-29: 1000 mL via INTRAVENOUS

## 2016-10-29 MED ORDER — CLINDAMYCIN PHOSPHATE 600 MG/50ML IV SOLN
600.0000 mg | Freq: Once | INTRAVENOUS | Status: AC
  Administered 2016-10-29: 600 mg via INTRAVENOUS
  Filled 2016-10-29: qty 50

## 2016-10-29 MED ORDER — HYDROMORPHONE HCL 2 MG/ML IJ SOLN
0.5000 mg | Freq: Once | INTRAMUSCULAR | Status: AC
  Administered 2016-10-29: 0.5 mg via INTRAVENOUS
  Filled 2016-10-29: qty 1

## 2016-10-29 MED ORDER — HEPARIN SOD (PORK) LOCK FLUSH 100 UNIT/ML IV SOLN
500.0000 [IU] | Freq: Once | INTRAVENOUS | Status: AC
  Administered 2016-10-29: 500 [IU]
  Filled 2016-10-29: qty 5

## 2016-10-29 MED ORDER — ONDANSETRON HCL 4 MG/2ML IJ SOLN
4.0000 mg | Freq: Once | INTRAMUSCULAR | Status: AC
  Administered 2016-10-29: 4 mg via INTRAVENOUS
  Filled 2016-10-29: qty 2

## 2016-10-29 MED ORDER — CEPHALEXIN 500 MG PO CAPS: 500.0000 mg | ORAL_CAPSULE | Freq: Four times a day (QID) | ORAL | 0 refills | Status: DC

## 2016-10-29 MED ORDER — TETANUS-DIPHTH-ACELL PERTUSSIS 5-2.5-18.5 LF-MCG/0.5 IM SUSP
0.5000 mL | Freq: Once | INTRAMUSCULAR | Status: AC
  Administered 2016-10-29: 0.5 mL via INTRAMUSCULAR
  Filled 2016-10-29: qty 0.5

## 2016-10-29 NOTE — Discharge Instructions (Signed)
Your kidney tests today are not normal. You need to drink plenty of water and have this rechecked by your primary care doctor in the next week. Do not take any NSAIDs (motrin, ibuprofen, advil, aleve, excedrin, aspirin, naproxen, goody's powder,  etc.) because they can further hurt your kidneys.   Please tell your wound care nurse she will need to perform wet-to-dry dressings.  Do not hesitate to return to the emergency department for any new, worsening or concerning symptoms including fever, increasing pain, increasing redness or purulent discharge from the wound.
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
## 2016-10-29 NOTE — ED Notes (Signed)
Pt back from CT, placed back on monitor
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
## 2016-10-29 NOTE — ED Notes (Signed)
Pt out for CT.

## 2016-10-29 NOTE — ED Notes (Signed)
Wasted 1.5mg  hydromorphone in sharps with Su Grand.

## 2016-10-29 NOTE — ED Triage Notes (Signed)
Pt has had surgery on right hip on 10/06/16 -- incision opened up after falling yesterday-- was supposed to come yesterday per ortho doc, has had multiple surgeries on right leg.

## 2016-11-07 ENCOUNTER — Encounter: Payer: Medicare Other | Admitting: Internal Medicine

## 2016-11-08 NOTE — Progress Notes (Signed)
Katherine Banks can you call Katherine Banks to see what gone on she missed her last appointment and she has an issue with her hip

## 2016-11-09 ENCOUNTER — Ambulatory Visit (INDEPENDENT_AMBULATORY_CARE_PROVIDER_SITE_OTHER): Payer: Medicare Other | Admitting: Orthopedic Surgery

## 2016-11-09 ENCOUNTER — Encounter (INDEPENDENT_AMBULATORY_CARE_PROVIDER_SITE_OTHER): Payer: Self-pay | Admitting: Orthopedic Surgery

## 2016-11-09 DIAGNOSIS — M869 Osteomyelitis, unspecified: Secondary | ICD-10-CM

## 2016-11-09 MED ORDER — AMOXICILLIN 500 MG PO TABS: 500.0000 mg | ORAL_TABLET | Freq: Two times a day (BID) | ORAL | 1 refills | Status: DC

## 2016-11-09 MED ORDER — DICYCLOMINE HCL 20 MG PO TABS: 20.0000 mg | ORAL_TABLET | Freq: Three times a day (TID) | ORAL | 1 refills | Status: DC
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
## 2016-11-10 ENCOUNTER — Telehealth (INDEPENDENT_AMBULATORY_CARE_PROVIDER_SITE_OTHER): Payer: Self-pay

## 2016-11-14 ENCOUNTER — Other Ambulatory Visit (INDEPENDENT_AMBULATORY_CARE_PROVIDER_SITE_OTHER): Payer: Self-pay | Admitting: Radiology

## 2016-11-14 DIAGNOSIS — M869 Osteomyelitis, unspecified: Secondary | ICD-10-CM

## 2016-11-16 ENCOUNTER — Ambulatory Visit (INDEPENDENT_AMBULATORY_CARE_PROVIDER_SITE_OTHER): Payer: Medicare Other | Admitting: Orthopedic Surgery

## 2016-11-18 ENCOUNTER — Telehealth (INDEPENDENT_AMBULATORY_CARE_PROVIDER_SITE_OTHER): Payer: Self-pay | Admitting: Orthopedic Surgery

## 2016-11-18 NOTE — Telephone Encounter (Signed)
Ok to shower pls clal thx

## 2016-11-18 NOTE — Telephone Encounter (Signed)
Please advise 

## 2016-11-18 NOTE — Telephone Encounter (Signed)
Laqueta Linden called from Dr. Lorri Frederick office stating they are unable to see pt.

## 2016-11-18 NOTE — Telephone Encounter (Signed)
Pt called about her wound that is not closed,  Wants to know if she can shower. Please advise.  [phone]

## 2016-11-18 NOTE — Telephone Encounter (Signed)
Needs follow-up at some time in the future to plan for surgery

## 2016-11-18 NOTE — Telephone Encounter (Signed)
See note, please advise further on a referral for her?

## 2016-11-18 NOTE — Telephone Encounter (Signed)
I called patient and advised.   Patient states that she brought all records when she first came to our office from [place] and [place]. We should have copies. We need to send those to [facility] for her. She had to cancel that appt because she cannot afford to send that stuff herself. She would like for Abigail Butts to please check on that.

## 2016-11-22 NOTE — Telephone Encounter (Signed)
IC Laqueta Linden LMVM asking her to call and clarify if they can see patient if we get the old medical records. Or if they just cannot see her at all.  IC pt, patient has the old records, and can get them to me to send to Dr Lorri Frederick office.  I will call pt back to update/advise once Hart[place] calls me back to clarify.

## 2016-11-24 NOTE — Telephone Encounter (Signed)
IC pt and advised we received note from [facility] that the decline to see her.  She understands and wants to sched appt to see Marlou Sa

## 2016-11-28 ENCOUNTER — Ambulatory Visit (INDEPENDENT_AMBULATORY_CARE_PROVIDER_SITE_OTHER): Payer: Medicare Other | Admitting: Orthopedic Surgery

## 2016-11-29 ENCOUNTER — Ambulatory Visit (INDEPENDENT_AMBULATORY_CARE_PROVIDER_SITE_OTHER): Payer: Medicare Other | Admitting: Orthopedic Surgery

## 2016-12-12 ENCOUNTER — Encounter (INDEPENDENT_AMBULATORY_CARE_PROVIDER_SITE_OTHER): Payer: Self-pay | Admitting: Orthopedic Surgery

## 2016-12-12 ENCOUNTER — Ambulatory Visit (INDEPENDENT_AMBULATORY_CARE_PROVIDER_SITE_OTHER): Payer: Medicare Other | Admitting: Orthopedic Surgery

## 2016-12-12 DIAGNOSIS — M869 Osteomyelitis, unspecified: Secondary | ICD-10-CM
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
## 2016-12-19 DIAGNOSIS — G894 Chronic pain syndrome: Secondary | ICD-10-CM | POA: Diagnosis not present

## 2016-12-19 DIAGNOSIS — M79604 Pain in right leg: Secondary | ICD-10-CM | POA: Diagnosis not present

## 2016-12-19 DIAGNOSIS — M25551 Pain in right hip: Secondary | ICD-10-CM | POA: Diagnosis not present

## 2017-01-11 ENCOUNTER — Telehealth: Payer: Self-pay | Admitting: Internal Medicine

## 2017-01-11 DIAGNOSIS — Z79891 Long term (current) use of opiate analgesic: Secondary | ICD-10-CM | POA: Insufficient documentation

## 2017-01-11 NOTE — Telephone Encounter (Signed)
error 

## 2017-01-15 ENCOUNTER — Other Ambulatory Visit (INDEPENDENT_AMBULATORY_CARE_PROVIDER_SITE_OTHER): Payer: Self-pay | Admitting: Orthopedic Surgery

## 2017-01-16 DIAGNOSIS — M79604 Pain in right leg: Secondary | ICD-10-CM | POA: Diagnosis not present

## 2017-01-16 DIAGNOSIS — M25551 Pain in right hip: Secondary | ICD-10-CM | POA: Diagnosis not present

## 2017-01-16 DIAGNOSIS — G894 Chronic pain syndrome: Secondary | ICD-10-CM | POA: Diagnosis not present

## 2017-01-16 NOTE — Telephone Encounter (Signed)
This is not something I usually prescribed please call thanks

## 2017-01-16 NOTE — Telephone Encounter (Signed)
Rx request 

## 2017-01-23 ENCOUNTER — Ambulatory Visit: Payer: Medicare HMO | Admitting: Internal Medicine

## 2017-01-25 ENCOUNTER — Ambulatory Visit (INDEPENDENT_AMBULATORY_CARE_PROVIDER_SITE_OTHER): Payer: Medicare HMO | Admitting: Internal Medicine

## 2017-01-25 DIAGNOSIS — M3214 Glomerular disease in systemic lupus erythematosus: Secondary | ICD-10-CM | POA: Diagnosis not present

## 2017-01-25 DIAGNOSIS — N059 Unspecified nephritic syndrome with unspecified morphologic changes: Secondary | ICD-10-CM

## 2017-01-25 DIAGNOSIS — G4710 Hypersomnia, unspecified: Secondary | ICD-10-CM

## 2017-01-25 DIAGNOSIS — M8668 Other chronic osteomyelitis, other site: Secondary | ICD-10-CM

## 2017-01-25 DIAGNOSIS — D649 Anemia, unspecified: Secondary | ICD-10-CM | POA: Diagnosis not present

## 2017-01-25 DIAGNOSIS — Z7952 Long term (current) use of systemic steroids: Secondary | ICD-10-CM | POA: Diagnosis not present

## 2017-01-25 DIAGNOSIS — R5383 Other fatigue: Secondary | ICD-10-CM

## 2017-01-25 DIAGNOSIS — Z993 Dependence on wheelchair: Secondary | ICD-10-CM | POA: Diagnosis not present

## 2017-01-25 DIAGNOSIS — E669 Obesity, unspecified: Secondary | ICD-10-CM | POA: Diagnosis not present

## 2017-01-25 DIAGNOSIS — Z87891 Personal history of nicotine dependence: Secondary | ICD-10-CM
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
## 2017-01-25 NOTE — Patient Instructions (Signed)
Ms. Dow,  It was a pleasure seeing you today. I have ordered some blood work to further investigate this fatigue you are experiencing. I will call you with the results. Please return to clinic in 2 weeks for follow up. If you have any questions or concerns, call our clinic at [phone] or after hours call [phone] and ask for the internal medicine resident on call. Thank you!  - Dr. Assunta Curtis

## 2017-01-25 NOTE — Assessment & Plan Note (Addendum)
Patient is here today with complaint of excessive daytime sleepiness and fatigue. This started about 1 week ago. Patient reports frequently falling asleep during normal everyday activities. Today in clinic, she appears very drowsy and is speaking to me with her eyes closed. Patient is prescribed multiple sedating medications, however she reports that she has not taken her Ambien in 4 nights and has not been taking her short-acting oxycodone due to her drowsiness. She last took her extended-release oxycodone at 9 PM last night. She reports that she's been on the same dose of medications for 6 months without issue. Patient does endorse snoring and multiple nighttime awakenings that could be consistent with OSA, but reports that she had a normal sleep study in the early 2000s. I do not see these records in our system. Repeat sleep study may be indicated if other workup is negative. Of note, patient does have a history of lupus and is on chronic prednisone and plaquenil therapy. She denies any recent decrease in her prednisone dose. In fact her dose was increased to 10 mg QID from 5 mg about 5 months ago making adrenal insufficiency unlikely. She also has chronic anemia and has required multiple blood transfusions in the past. She reports that she has not been taking her iron supplementation. Patient feels that her current symptoms consistent with her prior anemic episodes requiring transfusion. Last CBC was checked in December of 2017 was notable for microcytic anemia at her baseline Hgb of ~ 9. Patient also had a BMP checked at that time which showed new renal dysfunction. It appears this had not been followed up. Plan to repeat CBC today to assess for worsening of her chronic anemia, repeat BMP to follow up renal function. -- f/u CBC, Ferritin, BMP -- Follow up 2 weeks  ADDENDUM: Hemoglobin at baseline, Ferritin is low-normal. Renal function improved on BMP. No clear etiology for her fatigue. Called patient to  update her on results. Instructed her to resume her iron supplements, prescription sent to pharmacy.

## 2017-01-26 ENCOUNTER — Ambulatory Visit: Payer: Medicare HMO

## 2017-01-26 LAB — BMP8+ANION GAP
Anion Gap: 16 mmol/L (ref 10.0–18.0)
BUN / CREAT RATIO: 11 (ref 9–23)
BUN: 10 mg/dL (ref 6–24)
CHLORIDE: 103 mmol/L (ref 96–106)
CO2: 25 mmol/L (ref 18–29)
CREATININE: 0.89 mg/dL (ref 0.57–1.00)
Calcium: 8.8 mg/dL (ref 8.7–10.2)
GFR calc non Af Amer: 75 mL/min/{1.73_m2} (ref >59)
GFR, EST AFRICAN AMERICAN: 86 mL/min/{1.73_m2} (ref >59)
GLUCOSE: 121 mg/dL — AB (ref 65–99)
POTASSIUM: 4.2 mmol/L (ref 3.5–5.2)
SODIUM: 144 mmol/L (ref 134–144)

## 2017-01-26 LAB — CBC
Hematocrit: 29 % — ABNORMAL LOW (ref 34.0–46.6)
Hemoglobin: 8.4 g/dL — ABNORMAL LOW (ref 11.1–15.9)
MCH: 19.7 pg — ABNORMAL LOW (ref 26.6–33.0)
MCHC: 29 g/dL — ABNORMAL LOW (ref 31.5–35.7)
MCV: 68 fL — ABNORMAL LOW (ref 79–97)
PLATELETS: 416 10*3/uL — AB (ref 150–379)
RBC: 4.26 x10E6/uL (ref 3.77–5.28)
RDW: 19.8 % — ABNORMAL HIGH (ref 12.3–15.4)
WBC: 8.5 10*3/uL (ref 3.4–10.8)

## 2017-01-26 LAB — FERRITIN: FERRITIN: 24 ng/mL (ref 15–150)

## 2017-01-26 MED ORDER — FERROUS SULFATE 325 (65 FE) MG PO TABS: 325.0000 mg | ORAL_TABLET | Freq: Every day | ORAL | 3 refills | Status: DC

## 2017-01-26 NOTE — Progress Notes (Signed)
Internal Medicine Clinic Attending  Case discussed with Dr. Philipp Ovens at the time of the visit.  We reviewed the resident's history and exam and pertinent patient test results.  I agree with the assessment, diagnosis, and plan of care documented in the resident's note.  Complex patient with difficult to control lupus, nephritis, and chronic osteomyelitis of her right hip that has had numerous surgeries and prolonged antibiotics. Fatigue is likely multifactorial from her numerous comorbidities. Chronic microcytic anemia also likely related to chronic inflammation and lupus. Ferritin stores are reasonably well repleated, but still needs oral iron supplementation to continue. No indication for transfusion now. If symptoms progress may need re-imaging of her hip to look for progression of osteomyelitis.

## 2017-01-26 NOTE — Addendum Note (Signed)
Addended by: Jodean Lima on: 01/26/2017 09:42 AM   Modules accepted: Orders

## 2017-01-28 ENCOUNTER — Other Ambulatory Visit: Payer: Self-pay | Admitting: Internal Medicine

## 2017-02-06 ENCOUNTER — Other Ambulatory Visit (INDEPENDENT_AMBULATORY_CARE_PROVIDER_SITE_OTHER): Payer: Self-pay | Admitting: Orthopedic Surgery

## 2017-02-07 ENCOUNTER — Telehealth: Payer: Self-pay | Admitting: Internal Medicine

## 2017-02-07 NOTE — Telephone Encounter (Signed)
This is not something I usually prescribe pls clal thx

## 2017-02-07 NOTE — Telephone Encounter (Signed)
Rx request 

## 2017-02-07 NOTE — Telephone Encounter (Signed)
Calling to confirm appointment for 02/08/17 at 10:15 [facility]

## 2017-02-08 ENCOUNTER — Ambulatory Visit: Payer: Medicare HMO

## 2017-02-08 ENCOUNTER — Encounter: Payer: Self-pay | Admitting: Internal Medicine

## 2017-02-11 ENCOUNTER — Other Ambulatory Visit: Payer: Self-pay | Admitting: Internal Medicine

## 2017-02-13 DIAGNOSIS — M25551 Pain in right hip: Secondary | ICD-10-CM | POA: Diagnosis not present

## 2017-02-13 DIAGNOSIS — M79604 Pain in right leg: Secondary | ICD-10-CM | POA: Diagnosis not present

## 2017-02-13 DIAGNOSIS — G894 Chronic pain syndrome: Secondary | ICD-10-CM | POA: Diagnosis not present

## 2017-02-14 ENCOUNTER — Telehealth (INDEPENDENT_AMBULATORY_CARE_PROVIDER_SITE_OTHER): Payer: Self-pay | Admitting: Orthopedic Surgery

## 2017-02-14 ENCOUNTER — Telehealth (INDEPENDENT_AMBULATORY_CARE_PROVIDER_SITE_OTHER): Payer: Self-pay | Admitting: *Deleted

## 2017-02-14 NOTE — Telephone Encounter (Signed)
error 

## 2017-02-14 NOTE — Telephone Encounter (Signed)
Patient called and stated she is wanting to schedule surgery. Can you write blue sheet? Thank you!

## 2017-02-15 ENCOUNTER — Ambulatory Visit: Payer: Medicare HMO

## 2017-02-17 ENCOUNTER — Encounter: Payer: Self-pay | Admitting: Internal Medicine

## 2017-02-17 ENCOUNTER — Encounter (INDEPENDENT_AMBULATORY_CARE_PROVIDER_SITE_OTHER): Payer: Self-pay

## 2017-02-17 ENCOUNTER — Ambulatory Visit (INDEPENDENT_AMBULATORY_CARE_PROVIDER_SITE_OTHER): Payer: Medicare HMO | Admitting: Internal Medicine

## 2017-02-17 VITALS — BP 128/69 | HR 98 | Temp 98.1°F | Ht 65.0 in | Wt 254.3 lb

## 2017-02-17 DIAGNOSIS — R5383 Other fatigue: Secondary | ICD-10-CM

## 2017-02-17 DIAGNOSIS — L299 Pruritus, unspecified: Secondary | ICD-10-CM

## 2017-02-17 DIAGNOSIS — R400 Somnolence: Secondary | ICD-10-CM

## 2017-02-17 MED ORDER — NYSTATIN 100000 UNIT/GM EX OINT: TOPICAL_OINTMENT | Freq: Two times a day (BID) | CUTANEOUS | 2 refills | Status: DC

## 2017-02-17 NOTE — Patient Instructions (Signed)
General Instructions: - try using nystatin cream on your feet - Also try taking Claritin or Zyrtec. These will help with allergies and itching. They are non-drowsy.  Please bring your medicines with you each time you come to clinic.  Medicines may include prescription medications, over-the-counter medications, herbal remedies, eye drops, vitamins, or other pills.   Progress Toward Treatment Goals:  No flowsheet data found.  Self Care Goals & Plans:  No flowsheet data found.  No flowsheet data found.   Care Management & Community Referrals:  No flowsheet data found.
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
## 2017-02-18 DIAGNOSIS — L299 Pruritus, unspecified: Secondary | ICD-10-CM | POA: Insufficient documentation

## 2017-02-18 NOTE — Assessment & Plan Note (Signed)
No rashes or lesions on feet to suggest an infection such as athlete's foot. Advised patient to use her nystatin ointment on her feet and to try cetirizine for itching.

## 2017-02-18 NOTE — Assessment & Plan Note (Signed)
Her fatigue has mildly improved since her last clinic visit. Given she has some symptoms consistent with sleep apnea, will have her undergo a sleep study to ensure this is not the underlying cause for her excessive daytime sleepiness and fatigue. Patient is agreeable to plan.

## 2017-02-20 ENCOUNTER — Telehealth (INDEPENDENT_AMBULATORY_CARE_PROVIDER_SITE_OTHER): Payer: Self-pay | Admitting: Orthopedic Surgery

## 2017-02-20 NOTE — Telephone Encounter (Signed)
LVM with pt to call to schedule surgery. Will try pt again at a later time. 

## 2017-02-20 NOTE — Progress Notes (Signed)
Medicine attending: Medical history, presenting problems, physical findings, and medications, reviewed with resident physician Dr Carly Rivet on the day of the patient visit and I concur with her evaluation and management plan. 

## 2017-02-20 NOTE — Telephone Encounter (Signed)
Done pls call thx

## 2017-02-21 ENCOUNTER — Other Ambulatory Visit (INDEPENDENT_AMBULATORY_CARE_PROVIDER_SITE_OTHER): Payer: Self-pay | Admitting: Orthopedic Surgery

## 2017-02-21 DIAGNOSIS — M869 Osteomyelitis, unspecified: Secondary | ICD-10-CM

## 2017-03-09 ENCOUNTER — Other Ambulatory Visit: Payer: Self-pay | Admitting: Internal Medicine

## 2017-03-13 DIAGNOSIS — M25559 Pain in unspecified hip: Secondary | ICD-10-CM | POA: Diagnosis not present

## 2017-03-13 DIAGNOSIS — M545 Low back pain: Secondary | ICD-10-CM | POA: Diagnosis not present

## 2017-03-13 DIAGNOSIS — M2550 Pain in unspecified joint: Secondary | ICD-10-CM | POA: Diagnosis not present

## 2017-03-13 DIAGNOSIS — M79609 Pain in unspecified limb: Secondary | ICD-10-CM | POA: Diagnosis not present

## 2017-03-13 DIAGNOSIS — M25551 Pain in right hip: Secondary | ICD-10-CM | POA: Diagnosis not present

## 2017-03-13 DIAGNOSIS — G894 Chronic pain syndrome: Secondary | ICD-10-CM | POA: Diagnosis not present

## 2017-03-13 DIAGNOSIS — M79604 Pain in right leg: Secondary | ICD-10-CM | POA: Diagnosis not present

## 2017-03-15 ENCOUNTER — Emergency Department (HOSPITAL_COMMUNITY): Payer: Medicare HMO

## 2017-03-15 ENCOUNTER — Encounter (HOSPITAL_COMMUNITY): Payer: Self-pay | Admitting: Emergency Medicine

## 2017-03-15 DIAGNOSIS — R079 Chest pain, unspecified: Secondary | ICD-10-CM | POA: Diagnosis not present

## 2017-03-15 DIAGNOSIS — Z87891 Personal history of nicotine dependence: Secondary | ICD-10-CM | POA: Diagnosis not present

## 2017-03-15 DIAGNOSIS — Y999 Unspecified external cause status: Secondary | ICD-10-CM | POA: Diagnosis not present

## 2017-03-15 DIAGNOSIS — W228XXA Striking against or struck by other objects, initial encounter: Secondary | ICD-10-CM | POA: Diagnosis not present

## 2017-03-15 DIAGNOSIS — R0789 Other chest pain: Secondary | ICD-10-CM | POA: Insufficient documentation

## 2017-03-15 DIAGNOSIS — R0602 Shortness of breath: Secondary | ICD-10-CM | POA: Insufficient documentation

## 2017-03-15 DIAGNOSIS — Y929 Unspecified place or not applicable: Secondary | ICD-10-CM | POA: Insufficient documentation

## 2017-03-15 DIAGNOSIS — Z96641 Presence of right artificial hip joint: Secondary | ICD-10-CM | POA: Insufficient documentation

## 2017-03-15 DIAGNOSIS — M545 Low back pain: Secondary | ICD-10-CM | POA: Insufficient documentation

## 2017-03-15 DIAGNOSIS — M25551 Pain in right hip: Principal | ICD-10-CM | POA: Insufficient documentation

## 2017-03-15 DIAGNOSIS — Y939 Activity, unspecified: Secondary | ICD-10-CM | POA: Diagnosis not present

## 2017-03-15 DIAGNOSIS — S79911A Unspecified injury of right hip, initial encounter: Secondary | ICD-10-CM | POA: Diagnosis not present

## 2017-03-15 DIAGNOSIS — M48061 Spinal stenosis, lumbar region without neurogenic claudication: Secondary | ICD-10-CM | POA: Diagnosis not present

## 2017-03-15 DIAGNOSIS — I10 Essential (primary) hypertension: Secondary | ICD-10-CM | POA: Diagnosis not present

## 2017-03-15 DIAGNOSIS — J45909 Unspecified asthma, uncomplicated: Secondary | ICD-10-CM | POA: Diagnosis not present

## 2017-03-15 DIAGNOSIS — S99921A Unspecified injury of right foot, initial encounter: Secondary | ICD-10-CM | POA: Diagnosis not present

## 2017-03-15 DIAGNOSIS — Z8542 Personal history of malignant neoplasm of other parts of uterus: Secondary | ICD-10-CM | POA: Diagnosis not present

## 2017-03-15 DIAGNOSIS — Z91040 Latex allergy status: Secondary | ICD-10-CM | POA: Insufficient documentation

## 2017-03-15 DIAGNOSIS — M7989 Other specified soft tissue disorders: Secondary | ICD-10-CM | POA: Diagnosis not present

## 2017-03-15 LAB — CBC
HCT: 35.5 % — ABNORMAL LOW (ref 36.0–46.0)
HEMOGLOBIN: 10.5 g/dL — AB (ref 12.0–15.0)
MCH: 20.5 pg — AB (ref 26.0–34.0)
MCHC: 29.6 g/dL — ABNORMAL LOW (ref 30.0–36.0)
MCV: 69.5 fL — AB (ref 78.0–100.0)
PLATELETS: 494 10*3/uL — AB (ref 150–400)
RBC: 5.11 MIL/uL (ref 3.87–5.11)
RDW: 21.8 % — ABNORMAL HIGH (ref 11.5–15.5)
WBC: 12.9 10*3/uL — ABNORMAL HIGH (ref 4.0–10.5)

## 2017-03-15 LAB — BASIC METABOLIC PANEL
Anion gap: 11 (ref 5–15)
BUN: 9 mg/dL (ref 6–20)
CHLORIDE: 104 mmol/L (ref 101–111)
CO2: 24 mmol/L (ref 22–32)
CREATININE: 1.44 mg/dL — AB (ref 0.44–1.00)
Calcium: 8.9 mg/dL (ref 8.9–10.3)
GFR calc non Af Amer: 41 mL/min — ABNORMAL LOW (ref >60)
GFR, EST AFRICAN AMERICAN: 47 mL/min — AB (ref >60)
Glucose, Bld: 112 mg/dL — ABNORMAL HIGH (ref 65–99)
Potassium: 4.1 mmol/L (ref 3.5–5.1)
Sodium: 139 mmol/L (ref 135–145)

## 2017-03-15 LAB — I-STAT TROPONIN, ED: Troponin i, poc: 0 ng/mL (ref 0.00–0.08)

## 2017-03-15 NOTE — ED Triage Notes (Signed)
Patient reports intermittent central chest pain with mild SOB onset this week , denies nausea or diaphoresis , pt. added right hip pain and fall this morning . No cough or congestion .

## 2017-03-16 ENCOUNTER — Encounter (HOSPITAL_COMMUNITY): Payer: Self-pay | Admitting: General Practice

## 2017-03-16 ENCOUNTER — Emergency Department (HOSPITAL_COMMUNITY): Payer: Medicare HMO

## 2017-03-16 ENCOUNTER — Observation Stay (HOSPITAL_COMMUNITY)
Admission: EM | Admit: 2017-03-16 | Discharge: 2017-03-17 | Disposition: A | Discharge status: Home / Self Care | Payer: Medicare HMO | Attending: Internal Medicine | Admitting: Internal Medicine

## 2017-03-16 ENCOUNTER — Other Ambulatory Visit: Payer: Self-pay

## 2017-03-16 DIAGNOSIS — M25551 Pain in right hip: Secondary | ICD-10-CM | POA: Diagnosis not present

## 2017-03-16 DIAGNOSIS — N289 Disorder of kidney and ureter, unspecified: Secondary | ICD-10-CM

## 2017-03-16 DIAGNOSIS — M329 Systemic lupus erythematosus, unspecified: Secondary | ICD-10-CM | POA: Diagnosis present

## 2017-03-16 DIAGNOSIS — M7989 Other specified soft tissue disorders: Secondary | ICD-10-CM | POA: Diagnosis not present

## 2017-03-16 DIAGNOSIS — M8668 Other chronic osteomyelitis, other site: Secondary | ICD-10-CM | POA: Diagnosis present

## 2017-03-16 DIAGNOSIS — R9389 Abnormal findings on diagnostic imaging of other specified body structures: Secondary | ICD-10-CM

## 2017-03-16 DIAGNOSIS — R0602 Shortness of breath: Secondary | ICD-10-CM | POA: Diagnosis not present

## 2017-03-16 DIAGNOSIS — R079 Chest pain, unspecified: Secondary | ICD-10-CM

## 2017-03-16 DIAGNOSIS — M48061 Spinal stenosis, lumbar region without neurogenic claudication: Secondary | ICD-10-CM | POA: Diagnosis not present

## 2017-03-16 DIAGNOSIS — S99921A Unspecified injury of right foot, initial encounter: Secondary | ICD-10-CM | POA: Diagnosis not present

## 2017-03-16 DIAGNOSIS — S79911A Unspecified injury of right hip, initial encounter: Secondary | ICD-10-CM | POA: Diagnosis not present

## 2017-03-16 DIAGNOSIS — R0789 Other chest pain: Secondary | ICD-10-CM | POA: Diagnosis present

## 2017-03-16 HISTORY — DX: Anemia, unspecified: D64.9

## 2017-03-16 LAB — BASIC METABOLIC PANEL
Anion gap: 10 (ref 5–15)
BUN: 10 mg/dL (ref 6–20)
CHLORIDE: 102 mmol/L (ref 101–111)
CO2: 24 mmol/L (ref 22–32)
Calcium: 8.6 mg/dL — ABNORMAL LOW (ref 8.9–10.3)
Creatinine, Ser: 1.12 mg/dL — ABNORMAL HIGH (ref 0.44–1.00)
GFR calc Af Amer: >60 mL/min (ref >60)
GFR calc non Af Amer: 55 mL/min — ABNORMAL LOW (ref >60)
Glucose, Bld: 97 mg/dL (ref 65–99)
POTASSIUM: 4.1 mmol/L (ref 3.5–5.1)
SODIUM: 136 mmol/L (ref 135–145)

## 2017-03-16 LAB — TROPONIN I
Troponin I: <0.03 ng/mL (ref <0.03)
Troponin I: <0.03 ng/mL
Troponin I: <0.03 ng/mL (ref <0.03)

## 2017-03-16 LAB — BRAIN NATRIURETIC PEPTIDE: B Natriuretic Peptide: 8.8 pg/mL (ref 0.0–100.0)

## 2017-03-16 LAB — URINALYSIS, ROUTINE W REFLEX MICROSCOPIC
Bilirubin Urine: NEGATIVE
Glucose, UA: NEGATIVE mg/dL
Hgb urine dipstick: NEGATIVE
Ketones, ur: NEGATIVE mg/dL
Leukocytes, UA: NEGATIVE
Nitrite: NEGATIVE
Protein, ur: NEGATIVE mg/dL
Specific Gravity, Urine: 1.039 — ABNORMAL HIGH (ref 1.005–1.030)
pH: 5 (ref 5.0–8.0)

## 2017-03-16 LAB — MRSA PCR SCREENING: MRSA BY PCR: NEGATIVE

## 2017-03-16 LAB — CBC
HEMATOCRIT: 32.8 % — AB (ref 36.0–46.0)
Hemoglobin: 9.6 g/dL — ABNORMAL LOW (ref 12.0–15.0)
MCH: 20.2 pg — AB (ref 26.0–34.0)
MCHC: 29.3 g/dL — ABNORMAL LOW (ref 30.0–36.0)
MCV: 69.1 fL — AB (ref 78.0–100.0)
Platelets: 414 10*3/uL — ABNORMAL HIGH (ref 150–400)
RBC: 4.75 MIL/uL (ref 3.87–5.11)
RDW: 21.6 % — AB (ref 11.5–15.5)
WBC: 9.3 10*3/uL (ref 4.0–10.5)

## 2017-03-16 LAB — SEDIMENTATION RATE: Sed Rate: 38 mm/hr — ABNORMAL HIGH (ref 0–22)

## 2017-03-16 LAB — C-REACTIVE PROTEIN: CRP: 4.6 mg/dL — ABNORMAL HIGH (ref <1.0)

## 2017-03-16 LAB — D-DIMER, QUANTITATIVE: D-Dimer, Quant: 1.52 ug{FEU}/mL — ABNORMAL HIGH (ref 0.00–0.50)

## 2017-03-16 MED ORDER — FENTANYL CITRATE (PF) 100 MCG/2ML IJ SOLN
50.0000 ug | Freq: Once | INTRAMUSCULAR | Status: AC
  Administered 2017-03-16: 50 ug via INTRAVENOUS
  Filled 2017-03-16: qty 2

## 2017-03-16 MED ORDER — ALBUTEROL SULFATE (2.5 MG/3ML) 0.083% IN NEBU: 2.5000 mg | INHALATION_SOLUTION | RESPIRATORY_TRACT | Status: DC | PRN

## 2017-03-16 MED ORDER — MONTELUKAST SODIUM 10 MG PO TABS
10.0000 mg | ORAL_TABLET | Freq: Every day | ORAL | Status: DC
  Administered 2017-03-16 – 2017-03-17 (×2): 10 mg via ORAL
  Filled 2017-03-16 (×2): qty 1

## 2017-03-16 MED ORDER — PANTOPRAZOLE SODIUM 40 MG PO TBEC
40.0000 mg | DELAYED_RELEASE_TABLET | Freq: Every day | ORAL | Status: DC
  Administered 2017-03-16 – 2017-03-17 (×2): 40 mg via ORAL
  Filled 2017-03-16 (×2): qty 1

## 2017-03-16 MED ORDER — ACETAMINOPHEN 500 MG PO TABS: 1000.0000 mg | ORAL_TABLET | Freq: Four times a day (QID) | ORAL | Status: DC | PRN

## 2017-03-16 MED ORDER — HYDROCHLOROTHIAZIDE 25 MG PO TABS
25.0000 mg | ORAL_TABLET | Freq: Every day | ORAL | Status: DC
  Administered 2017-03-16 – 2017-03-17 (×2): 25 mg via ORAL
  Filled 2017-03-16 (×2): qty 1

## 2017-03-16 MED ORDER — AMLODIPINE BESYLATE 10 MG PO TABS
10.0000 mg | ORAL_TABLET | Freq: Every day | ORAL | Status: DC
  Administered 2017-03-16 – 2017-03-17 (×2): 10 mg via ORAL
  Filled 2017-03-16: qty 2
  Filled 2017-03-16: qty 1

## 2017-03-16 MED ORDER — HYDROMORPHONE HCL 1 MG/ML IJ SOLN
0.5000 mg | Freq: Once | INTRAMUSCULAR | Status: AC
  Administered 2017-03-16: 0.5 mg via INTRAVENOUS
  Filled 2017-03-16: qty 1

## 2017-03-16 MED ORDER — DICLOFENAC SODIUM 1 % TD GEL
4.0000 g | Freq: Four times a day (QID) | TRANSDERMAL | Status: DC
  Administered 2017-03-16 – 2017-03-17 (×4): 4 g via TOPICAL
  Filled 2017-03-16: qty 100

## 2017-03-16 MED ORDER — HYDROXYCHLOROQUINE SULFATE 200 MG PO TABS
200.0000 mg | ORAL_TABLET | Freq: Two times a day (BID) | ORAL | Status: DC
  Administered 2017-03-16 – 2017-03-17 (×2): 200 mg via ORAL
  Filled 2017-03-16 (×2): qty 1

## 2017-03-16 MED ORDER — ONDANSETRON HCL 4 MG PO TABS: 4.0000 mg | ORAL_TABLET | Freq: Four times a day (QID) | ORAL | Status: DC | PRN

## 2017-03-16 MED ORDER — FLUTICASONE FUROATE-VILANTEROL 200-25 MCG/INH IN AEPB
1.0000 | INHALATION_SPRAY | Freq: Every day | RESPIRATORY_TRACT | Status: DC
  Filled 2017-03-16 (×2): qty 28

## 2017-03-16 MED ORDER — DULOXETINE HCL 30 MG PO CPEP
30.0000 mg | ORAL_CAPSULE | Freq: Every day | ORAL | Status: DC
  Administered 2017-03-16: 30 mg via ORAL
  Filled 2017-03-16 (×2): qty 1

## 2017-03-16 MED ORDER — ZOLPIDEM TARTRATE 5 MG PO TABS
5.0000 mg | ORAL_TABLET | Freq: Every evening | ORAL | Status: DC | PRN
  Administered 2017-03-16: 5 mg via ORAL
  Filled 2017-03-16: qty 1

## 2017-03-16 MED ORDER — OXYCODONE HCL ER 10 MG PO T12A
30.0000 mg | EXTENDED_RELEASE_TABLET | Freq: Two times a day (BID) | ORAL | Status: DC
  Administered 2017-03-16 – 2017-03-17 (×2): 30 mg via ORAL
  Filled 2017-03-16 (×2): qty 3

## 2017-03-16 MED ORDER — ACETAMINOPHEN 650 MG RE SUPP: 650.0000 mg | Freq: Four times a day (QID) | RECTAL | Status: DC | PRN

## 2017-03-16 MED ORDER — GABAPENTIN 400 MG PO CAPS
800.0000 mg | ORAL_CAPSULE | Freq: Three times a day (TID) | ORAL | Status: DC
  Administered 2017-03-16 – 2017-03-17 (×4): 800 mg via ORAL
  Filled 2017-03-16 (×4): qty 2

## 2017-03-16 MED ORDER — SODIUM CHLORIDE 0.9% FLUSH
10.0000 mL | Freq: Two times a day (BID) | INTRAVENOUS | Status: DC
  Administered 2017-03-17: 20 mL

## 2017-03-16 MED ORDER — SODIUM CHLORIDE 0.9% FLUSH: 10.0000 mL | INTRAVENOUS | Status: DC | PRN

## 2017-03-16 MED ORDER — POLYSACCHARIDE IRON COMPLEX 150 MG PO CAPS
150.0000 mg | ORAL_CAPSULE | Freq: Every day | ORAL | Status: DC
  Administered 2017-03-16 – 2017-03-17 (×2): 150 mg via ORAL
  Filled 2017-03-16 (×2): qty 1

## 2017-03-16 MED ORDER — IOPAMIDOL (ISOVUE-370) INJECTION 76%
INTRAVENOUS | Status: AC
  Administered 2017-03-16: 100 mL
  Filled 2017-03-16: qty 100

## 2017-03-16 MED ORDER — PREDNISONE 5 MG PO TABS
5.0000 mg | ORAL_TABLET | Freq: Four times a day (QID) | ORAL | Status: DC
  Administered 2017-03-16 – 2017-03-17 (×5): 5 mg via ORAL
  Filled 2017-03-16 (×8): qty 1

## 2017-03-16 MED ORDER — OXYCODONE HCL 5 MG PO TABS
15.0000 mg | ORAL_TABLET | ORAL | Status: DC | PRN
Start: 2017-03-16 — End: 2017-03-17
  Administered 2017-03-16 – 2017-03-17 (×3): 15 mg via ORAL
  Filled 2017-03-16 (×3): qty 3

## 2017-03-16 MED ORDER — POLYMYXIN B-TRIMETHOPRIM 10000-0.1 UNIT/ML-% OP SOLN
1.0000 [drp] | Freq: Four times a day (QID) | OPHTHALMIC | Status: DC
  Administered 2017-03-16 – 2017-03-17 (×3): 1 [drp] via OPHTHALMIC
  Filled 2017-03-16: qty 10

## 2017-03-16 MED ORDER — ENOXAPARIN SODIUM 40 MG/0.4ML ~~LOC~~ SOLN
40.0000 mg | SUBCUTANEOUS | Status: DC
  Administered 2017-03-17: 40 mg via SUBCUTANEOUS
  Filled 2017-03-16: qty 0.4

## 2017-03-16 MED ORDER — SENNOSIDES-DOCUSATE SODIUM 8.6-50 MG PO TABS: 1.0000 | ORAL_TABLET | Freq: Every evening | ORAL | Status: DC | PRN

## 2017-03-16 MED ORDER — DICYCLOMINE HCL 20 MG PO TABS
20.0000 mg | ORAL_TABLET | Freq: Three times a day (TID) | ORAL | Status: DC
  Administered 2017-03-16 – 2017-03-17 (×4): 20 mg via ORAL
  Filled 2017-03-16 (×4): qty 1

## 2017-03-16 MED ORDER — FLUTICASONE PROPIONATE 50 MCG/ACT NA SUSP
1.0000 | Freq: Every day | NASAL | Status: DC | PRN
  Filled 2017-03-16: qty 16

## 2017-03-16 MED ORDER — SODIUM CHLORIDE 0.9% FLUSH: 3.0000 mL | Freq: Two times a day (BID) | INTRAVENOUS | Status: DC

## 2017-03-16 MED ORDER — ONDANSETRON HCL 4 MG/2ML IJ SOLN: 4.0000 mg | Freq: Four times a day (QID) | INTRAMUSCULAR | Status: DC | PRN

## 2017-03-16 MED ORDER — ONDANSETRON HCL 4 MG/2ML IJ SOLN
4.0000 mg | Freq: Once | INTRAMUSCULAR | Status: AC
  Administered 2017-03-16: 4 mg via INTRAVENOUS
  Filled 2017-03-16: qty 2

## 2017-03-16 MED ORDER — CYCLOBENZAPRINE HCL 10 MG PO TABS
10.0000 mg | ORAL_TABLET | Freq: Every day | ORAL | Status: DC | PRN
  Administered 2017-03-17: 10 mg via ORAL
  Filled 2017-03-16: qty 1

## 2017-03-16 NOTE — ED Notes (Signed)
Lunch tray ordered 

## 2017-03-16 NOTE — ED Notes (Signed)
Breakfast tray ordered 
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
## 2017-03-16 NOTE — ED Notes (Signed)
Pt believes she is having a lupus flare.

## 2017-03-16 NOTE — ED Notes (Signed)
Pt requesting pain meds

## 2017-03-16 NOTE — ED Notes (Signed)
Placed pt on bedpan, tolerated well. 

## 2017-03-17 ENCOUNTER — Telehealth: Payer: Self-pay | Admitting: Internal Medicine

## 2017-03-17 ENCOUNTER — Observation Stay (HOSPITAL_BASED_OUTPATIENT_CLINIC_OR_DEPARTMENT_OTHER): Payer: Medicare HMO

## 2017-03-17 DIAGNOSIS — Z885 Allergy status to narcotic agent status: Secondary | ICD-10-CM | POA: Diagnosis not present

## 2017-03-17 DIAGNOSIS — Z887 Allergy status to serum and vaccine status: Secondary | ICD-10-CM | POA: Diagnosis not present

## 2017-03-17 DIAGNOSIS — M868X8 Other osteomyelitis, other site: Secondary | ICD-10-CM | POA: Diagnosis not present

## 2017-03-17 DIAGNOSIS — I300 Acute nonspecific idiopathic pericarditis: Secondary | ICD-10-CM

## 2017-03-17 DIAGNOSIS — Z7951 Long term (current) use of inhaled steroids: Secondary | ICD-10-CM

## 2017-03-17 DIAGNOSIS — N289 Disorder of kidney and ureter, unspecified: Secondary | ICD-10-CM

## 2017-03-17 DIAGNOSIS — R0789 Other chest pain: Secondary | ICD-10-CM | POA: Diagnosis not present

## 2017-03-17 DIAGNOSIS — Z888 Allergy status to other drugs, medicaments and biological substances status: Secondary | ICD-10-CM | POA: Diagnosis not present

## 2017-03-17 DIAGNOSIS — M329 Systemic lupus erythematosus, unspecified: Secondary | ICD-10-CM | POA: Diagnosis not present

## 2017-03-17 DIAGNOSIS — Z91040 Latex allergy status: Secondary | ICD-10-CM | POA: Diagnosis not present

## 2017-03-17 DIAGNOSIS — Z9109 Other allergy status, other than to drugs and biological substances: Secondary | ICD-10-CM | POA: Diagnosis not present

## 2017-03-17 DIAGNOSIS — Z79899 Other long term (current) drug therapy: Secondary | ICD-10-CM

## 2017-03-17 DIAGNOSIS — R9389 Abnormal findings on diagnostic imaging of other specified body structures: Secondary | ICD-10-CM

## 2017-03-17 DIAGNOSIS — M25551 Pain in right hip: Secondary | ICD-10-CM | POA: Diagnosis not present

## 2017-03-17 DIAGNOSIS — R918 Other nonspecific abnormal finding of lung field: Secondary | ICD-10-CM | POA: Diagnosis not present

## 2017-03-17 LAB — BASIC METABOLIC PANEL
ANION GAP: 10 (ref 5–15)
BUN: 11 mg/dL (ref 6–20)
CALCIUM: 9.2 mg/dL (ref 8.9–10.3)
CO2: 28 mmol/L (ref 22–32)
CREATININE: 0.87 mg/dL (ref 0.44–1.00)
Chloride: 99 mmol/L — ABNORMAL LOW (ref 101–111)
Glucose, Bld: 112 mg/dL — ABNORMAL HIGH (ref 65–99)
Potassium: 4.3 mmol/L (ref 3.5–5.1)
SODIUM: 137 mmol/L (ref 135–145)

## 2017-03-17 LAB — ECHOCARDIOGRAM LIMITED
CHL CUP MV DEC (S): 704
E decel time: 704 msec
FS: 33 % (ref 28–44)
Height: 66 in
IV/PV OW: 1.33
LA ID, A-P, ES: 24 mm
LA diam index: 1.09 cm/m2
LEFT ATRIUM END SYS DIAM: 24 mm
MV pk E vel: 94.6 m/s
MVPG: 4 mmHg
MVPKAVEL: 102 m/s
P 1/2 time: 206 ms
PW: 12 mm — AB (ref 0.6–1.1)
Weight: 4024 oz

## 2017-03-17 LAB — CBC
HCT: 32.8 % — ABNORMAL LOW (ref 36.0–46.0)
HEMOGLOBIN: 9.7 g/dL — AB (ref 12.0–15.0)
MCH: 20.3 pg — AB (ref 26.0–34.0)
MCHC: 29.6 g/dL — ABNORMAL LOW (ref 30.0–36.0)
MCV: 68.8 fL — ABNORMAL LOW (ref 78.0–100.0)
PLATELETS: 427 10*3/uL — AB (ref 150–400)
RBC: 4.77 MIL/uL (ref 3.87–5.11)
RDW: 21.4 % — ABNORMAL HIGH (ref 11.5–15.5)
WBC: 9.4 10*3/uL (ref 4.0–10.5)

## 2017-03-17 LAB — HEMOGLOBIN A1C
Hgb A1c MFr Bld: 6.1 % — ABNORMAL HIGH (ref 4.8–5.6)
MEAN PLASMA GLUCOSE: 128 mg/dL

## 2017-03-17 LAB — C3 COMPLEMENT: C3 COMPLEMENT: 172 mg/dL — AB (ref 82–167)

## 2017-03-17 LAB — LIPID PANEL
CHOL/HDL RATIO: 3.5 ratio
CHOLESTEROL: 142 mg/dL (ref 0–200)
HDL: 41 mg/dL (ref >40)
LDL Cholesterol: 88 mg/dL (ref 0–99)
TRIGLYCERIDES: 65 mg/dL (ref <150)
VLDL: 13 mg/dL (ref 0–40)

## 2017-03-17 LAB — C4 COMPLEMENT: Complement C4, Body Fluid: 37 mg/dL (ref 14–44)

## 2017-03-17 LAB — ANTI-DNA ANTIBODY, DOUBLE-STRANDED: ds DNA Ab: 1 IU/mL (ref 0–9)

## 2017-03-17 MED ORDER — HEPARIN SOD (PORK) LOCK FLUSH 100 UNIT/ML IV SOLN
500.0000 [IU] | INTRAVENOUS | Status: DC | PRN
  Administered 2017-03-17: 500 [IU]
  Filled 2017-03-17 (×2): qty 5

## 2017-03-17 MED ORDER — POLYMYXIN B-TRIMETHOPRIM 10000-0.1 UNIT/ML-% OP SOLN: 1.0000 [drp] | Freq: Four times a day (QID) | OPHTHALMIC | 0 refills | Status: DC

## 2017-03-17 MED ORDER — HEPARIN SOD (PORK) LOCK FLUSH 100 UNIT/ML IV SOLN
500.0000 [IU] | INTRAVENOUS | Status: DC
  Filled 2017-03-17: qty 5

## 2017-03-17 NOTE — Telephone Encounter (Signed)
APT. REMINDER CALL, NO ANSWER, NO VOICEMAIL °

## 2017-03-17 NOTE — Progress Notes (Signed)
Chart reviewed, including ECG, labs and imaging studies. Noncoronary chest pain. Likely noncardiac, but could be pericarditis in a patient with autoimmune disease. Had normal echo in Sept 2017. Check limited echo for pericardial effusion. Will gladly come back for a full consult if there are abnormalities on echo.  Sanda Klein, MD, [facility] [phone] office [phone] pager

## 2017-03-17 NOTE — Discharge Summary (Signed)
Name: Katherine Banks MRN: [id number] DOB: Mar 30, 1966 51 y.o. PCP: [name], MD  Date of Admission: 03/16/2017  1:43 AM Date of Discharge: 03/17/2017 Attending Physician: Aldine Contes, MD  Discharge Diagnosis:  Principal Problem:   Atypical chest pain Active Problems:   Lupus (systemic lupus erythematosus) (HCC)   Chronic osteomyelitis of hip ([place])   Acute renal insufficiency   Abnormal chest CT   Discharge Medications: Allergies as of 03/17/2017      Reactions   Compazine [prochlorperazine Edisylate] Anaphylaxis, Swelling, Rash   Morphine And Related Anaphylaxis   Projectile vomiting and coughing also   Phenergan [promethazine Hcl] Anaphylaxis   Toradol [ketorolac Tromethamine] Anaphylaxis, Hives, Rash   Exforge Hct [amlodipine-valsartan-hctz] Nausea And Vomiting   Thoughts were not settled, neurological changes also   Influenza Vaccines Itching, Nausea And Vomiting, Rash, Other (See Comments)   Swollen lips   Latex Rash   Tape Itching, Rash      Medication List    TAKE these medications   albuterol (2.5 MG/3ML) 0.083% nebulizer solution Commonly known as:  PROVENTIL Take 3 mLs (2.5 mg total) by nebulization every 8 (eight) hours. What changed:  when to take this  reasons to take this   amLODipine 10 MG tablet Commonly known as:  NORVASC TAKE 1 TABLET EVERY DAY   cyclobenzaprine 10 MG tablet Commonly known as:  FLEXERIL Take 1 tablet (10 mg total) by mouth daily as needed for muscle spasms.   diclofenac sodium 1 % Gel Commonly known as:  VOLTAREN Apply 4 g topically 4 (four) times daily -  before meals and at bedtime.   dicyclomine 20 MG tablet Commonly known as:  BENTYL Take 1 tablet (20 mg total) by mouth 3 (three) times daily before meals.   DULoxetine 30 MG capsule Commonly known as:  CYMBALTA TAKE 1 CAPSULE (30 MG TOTAL) BY MOUTH DAILY.   ferrous sulfate 325 (65 FE) MG tablet Take 1 tablet (325 mg total) by mouth daily.     fluticasone 50 MCG/ACT nasal spray Commonly known as:  FLONASE Place 1 spray into both nostrils daily. What changed:  how much to take  when to take this  reasons to take this   fluticasone furoate-vilanterol 200-25 MCG/INH Aepb Commonly known as:  BREO ELLIPTA Inhale 1 puff into the lungs daily.   gabapentin 400 MG capsule Commonly known as:  NEURONTIN TAKE 2 CAPSULES BY MOUTH 3 TIMES A DAY   hydrochlorothiazide 25 MG tablet Commonly known as:  HYDRODIURIL Take 1 tablet (25 mg total) by mouth daily.   hydroxychloroquine 200 MG tablet Commonly known as:  PLAQUENIL Take 1 tablet (200 mg total) by mouth 2 (two) times daily.   montelukast 10 MG tablet Commonly known as:  SINGULAIR Take 10 mg by mouth daily. Reported on 03/14/2016   nystatin ointment Commonly known as:  MYCOSTATIN Apply topically 2 (two) times daily.   omeprazole 20 MG capsule Commonly known as:  PRILOSEC TAKE ONE CAPSULE BY MOUTH TWICE A DAY BEFORE A MEAL   ondansetron 4 MG tablet Commonly known as:  ZOFRAN Take 1 tablet (4 mg total) by mouth every 6 (six) hours as needed for nausea.   oxyCODONE 30 MG 12 hr tablet Take 30 mg by mouth every 12 (twelve) hours.   oxyCODONE 15 MG immediate release tablet Commonly known as:  ROXICODONE Take 1 tablet (15 mg total) by mouth every 4 (four) hours as needed for severe pain.   predniSONE 5 MG tablet Commonly known  as:  DELTASONE Take 5 mg by mouth 4 (four) times daily.   trimethoprim-polymyxin b ophthalmic solution Commonly known as:  POLYTRIM Place 1 drop into the right eye every 6 (six) hours.   zolpidem 10 MG tablet Commonly known as:  AMBIEN Take 10 mg by mouth at bedtime as needed for sleep.       Disposition and follow-up:   [name] was discharged from [facility] in Stable condition.  At the hospital follow up visit please address:  1.  Chest Pain. Ask about further symptoms of chest pain and dyspnea.  2.   Labs / imaging needed at time of follow-up: BMP  3.  Pending labs/ test needing follow-up: none  Follow-up Appointments: Follow-up Information    [name], MD. Go in 3 day(s).   Specialty:  Internal Medicine Why:  at 2:45pm Contact information: [place] [id number] [facility] Course by problem list: Principal Problem:   Atypical chest pain Active Problems:   Lupus (systemic lupus erythematosus) ([place])   Chronic osteomyelitis of hip ([place])   Acute renal insufficiency   Abnormal chest CT   1. Atypical Chest Pain Ms Pouncey was admitted with several days of atypical chest pain and falls.  Her pain is substernal pressure, but is non-exertional, without dyspnea, pleurisy, diaphoresis, or other symptoms, and worse when moving her upper body.  She had a positive D Dimer but PE was ruled out with CTA.  MI was ruled out with nonischemic EKG and serial negative troponins.  She did not have pericarditis, with no EKG changes, effusion on echocardiogram, or rub on exam. Her chest pain was deemed musculoskeletal, and she was discharged home with return precautions and instructions to continue her pain medications and antispasmodics.  2. Acute Renal Insufficiency Admission her creatinine was elevated above baseline, but returned to normal with improved by mouth intake.  3. SLE She mentioned that her chest discomfort felt like prior lupus flares. Her ESR and CRP were at baseline, complement levels normal, and dsDNA low, suggesting she was not having a flare. Her renal function rapidly returned to baseline and she did not have any proteinuria, reducing concern for lupus nephritis.  4. Chronic Osteomyelitis of Right Hip CT stable, no fevers/chills, pain well controlled, no sign of acute or systemic infection.  Further surgery planned for June.  5. Abnormal Chest CT CTA of chest incidentally found a low-density abnormality, recommend 3 month repeat CT  or PET-CT.  Discharge Vitals:   BP (!) 149/68 (BP Location: Right Arm)   Pulse 89   Temp 97.6 F (36.4 C) (Oral)   Resp 20   Ht _0  (1.676 m)   Wt 251 lb 8 oz (114.1 kg)   SpO2 96%   BMI 40.59 kg/m   Pertinent Labs, Studies, and Procedures:   CBC Latest Ref Rng & Units 03/17/2017 03/16/2017 03/15/2017  WBC 4.0 - 10.5 K/uL 9.4 9.3 12.9(H)  Hemoglobin 12.0 - 15.0 g/dL 9.7(L) 9.6(L) 10.5(L)  Hematocrit 36.0 - 46.0 % 32.8(L) 32.8(L) 35.5(L)  Platelets 150 - 400 K/uL 427(H) 414(H) 494(H)   CMP Latest Ref Rng & Units 03/17/2017 03/16/2017 03/15/2017  Glucose 65 - 99 mg/dL 112(H) 97 112(H)  BUN 6 - 20 mg/dL _1 Creatinine 0.44 - 1.00 mg/dL 0.87 1.12(H) 1.44(H)  Sodium 135 - 145 mmol/L 137 136 139  Potassium 3.5 - 5.1 mmol/L 4.3 4.1 4.1  Chloride 101 - 111 mmol/L 99(L) 102 104  CO2 22 - 32 mmol/L _0 Calcium 8.9 - 10.3 mg/dL 9.2 8.6(L) 8.9  Total Protein 6.5 - 8.1 g/dL - - -  Total Bilirubin 0.3 - 1.2 mg/dL - - -  Alkaline Phos 38 - 126 U/L - - -  AST 15 - 41 U/L - - -  ALT 14 - 54 U/L - - -   Cardiac Panel (last 3 results)  Recent Labs  03/16/17 0226 03/16/17 0557 03/16/17 1426  TROPONINI <0.03 <0.03 <0.03   CTA Chest PE 03/16/2017 IMPRESSION: 1.  No pulmonary embolus. 2. Mild multifocal mediastinal adenopathy. This is nonspecific, and may be reactive, however given listed history of uterine cancer, three-month follow-up CT versus PET-CT is recommended to evaluate for metabolic activity. 3. Tubular low-density 2.4 x 1 cm structure in the left perihilar lung. This may reflect focal mucoid impaction, as irregular densities extend peripherally to the pleura, however can be reassessed on follow-up. The 4 mm left upper lobe nodule can also be reassessed on follow-up. 4. Bronchial thickening, suggesting bronchitis, of uncertain acuity. Heterogeneous opacities at the lung bases likely atelectasis.  CT Right Hip w/o Contrast 03/16/2017 IMPRESSION: 1. No evidence  of acute abnormality. 2. Postsurgical and chronic changes about the hip joint and upper femur, sequela of chronic osteomyelitis. The previous fluid collection adjacent to the lateral femoral shaft has resolve with residual soft tissue density likely scarring.  Discharge Instructions: Discharge Instructions    Diet - low sodium heart healthy    Complete by:  As directed    Increase activity slowly    Complete by:  As directed        You were admitted to the hospital because of chest pain.  We determined that you did not have a heart attack, blood clot in lungs, pneumonia, or a lupus flare.  I think your chest soreness is most likely from a strained muscle in your chest.  You can use the Voltaren gel on your chest in addition to your hip.  Follow-up with your PCP as scheduled on Monday.  If you have worse chest pain, shortness of breath, fevers, or chills, please come back to the ED.   Signed: Minus Liberty, MD 03/17/2017, 3:06 PM   Pager: [phone]

## 2017-03-17 NOTE — Progress Notes (Signed)
  Date: 03/17/2017  Patient name: Katherine Banks  Medical record number: [id number]  Date of birth: [birth year]   I have seen and evaluated Katherine Banks and discussed their care with the Residency Team. In brief, patient is a 51 year old female with a past medical history of SLE, DVT/PE status post IVC filter, hypertension, asthma, avascular necrosis of bilateral hips, recurrent osteomyelitis of the right hip, GERD who presented to the ED with chest pain 2 days.  Patient states that 2 days ago she developed sudden onset of midsternal chest pain which is pressure-like in nature, nonradiating, worse with movement and relieved on sitting still. Patient also complains of some mild dyspnea on exertion but this appears to be her baseline. She also complains of some associated nausea but no vomiting as well as some diaphoresis. No abdominal pain, no fevers or chills, no lightheadedness, no syncope, no focal weakness, no headache, no blurry vision. Patient does complain of chronic pain in her right hip from her recurrent osteomyelitis and is scheduled to have surgery again for this on June 5th.   PMHx, Fam Hx, and/or Soc Hx : As per resident admit note  Vitals:   03/17/17 0006 03/17/17 0414  BP: 119/71 (!) 113/50  Pulse: 79 79  Resp:    Temp: 97.8 F (36.6 C) 97.6 F (36.4 C)   Gen.: Awake alert and oriented 3, NAD CVS: Regular rate and rhythm, 2/6 systolic murmur noted Lungs: CTA bilaterally Abdomen: Soft, obese, nontender, normoactive bowel sounds Extremities: Right lower extremity shortened and externally rotated, no edema noted  Assessment and Plan: I have seen and evaluated the patient as outlined above. I agree with the formulated Assessment and Plan as detailed in the residents' note, with the following changes:   1. Chest pain: - Patient is a 51 year old female with past medical history of lupus and DVT/PE who presents with chest pain over the last 2 days. CTA chest with no  evidence of PE. Troponins are negative 3 sets and EKG has no acute ischemic changes. The pain is likely musculoskeletal in nature given it is exacerbated by movement but patient may also have mild pericarditis given her history of lupus. - Cardiology follow-up and recommendations appreciated - We'll obtain limited echo to evaluate for pericardial effusion - Voltaren gel as necessary for chest pain - Patient also noted to have mild renal insufficiency on admission which has resolved with IV fluids. No further workup for now - Patient is stable for discharge home today if echo does not show any acute abnormalities  Aldine Contes, MD 5/18/201811:15 AM

## 2017-03-17 NOTE — Discharge Instructions (Signed)
You were admitted to the hospital because of chest pain.  We determined that you did not have a heart attack, blood clot in lungs, pneumonia, or a lupus flare.  I think your chest soreness is most likely from a strained muscle in your chest.  You can use the Voltaren gel on your chest in addition to your hip.  Follow-up with your PCP as scheduled on Monday.  If you have worse chest pain, shortness of breath, fevers, or chills, please come back to the ED.  Chest Wall Pain Chest wall pain is pain in or around the bones and muscles of your chest. Sometimes, an injury causes this pain. Sometimes, the cause may not be known. This pain may take several weeks or longer to get better. Follow these instructions at home: Pay attention to any changes in your symptoms. Take these actions to help with your pain:  Rest as told by your doctor.  Avoid activities that cause pain. Try not to use your chest, belly (abdominal), or side muscles to lift heavy things.  If directed, apply ice to the painful area:  Put ice in a plastic bag.  Place a towel between your skin and the bag.  Leave the ice on for 20 minutes, 2-3 times per day.  Take over-the-counter and prescription medicines only as told by your doctor.  Do not use tobacco products, including cigarettes, chewing tobacco, and e-cigarettes. If you need help quitting, ask your doctor.  Keep all follow-up visits as told by your doctor. This is important. Contact a doctor if:  You have a fever.  Your chest pain gets worse.  You have new symptoms. Get help right away if:  You feel sick to your stomach (nauseous) or you throw up (vomit).  You feel sweaty or light-headed.  You have a cough with phlegm (sputum) or you cough up blood.  You are short of breath. This information is not intended to replace advice given to you by your health care provider. Make sure you discuss any questions you have with your health care provider. Document  Released: 04/04/2008 Document Revised: 03/24/2016 Document Reviewed: 01/12/2015 Elsevier Interactive Patient Education  2017 [employer].

## 2017-03-17 NOTE — Progress Notes (Signed)
The patient has been given her discharge paper work along with a new medication list and what to take today. She has follow up appointments and a prescription. She is leaving via [facility] ride.   Saddie Benders RN

## 2017-03-17 NOTE — Evaluation (Signed)
Physical Therapy Evaluation Patient Details Name: Katherine Banks MRN: [id number] DOB: [birth year] Today's Date: 03/17/2017   History of Present Illness  51 year old woman with Lupus on prednisone, DVT/PE s/p IVC filter, HTN, asthma, avascular necrosis of bilateral hips, recurrent osteomyelitis of the R hip, GERD, anxiety presenting with chest pain x 2 days and 3 falls in 2 days.   Clinical Impression  Pt independently ambulated 130' with RW without LOB, distance limited by R hip pain, noted pt scheduled for R hip surgery in early June. She is near baseline with mobility and is ready to DC home from PT standpoint, no further PT indicated, will sign off.     Follow Up Recommendations No PT follow up    Equipment Recommendations  None recommended by PT    Recommendations for Other Services       Precautions / Restrictions Precautions Precautions: Fall Precaution Comments: 3 falls just prior to admission, pt reports h/o 1 other fall in past 1 year Restrictions Weight Bearing Restrictions: No      Mobility  Bed Mobility Overal bed mobility: Modified Independent             General bed mobility comments: HOB up  Transfers Overall transfer level: Modified independent Equipment used: Rolling walker (2 wheeled)                Ambulation/Gait Ambulation/Gait assistance: Modified independent (Device/Increase time) Ambulation Distance (Feet): 130 Feet Assistive device: Rolling walker (2 wheeled) Gait Pattern/deviations: Step-to pattern   Gait velocity interpretation: Below normal speed for age/gender General Gait Details: steady, no LOB, distance limited by R hip pain  Stairs            Wheelchair Mobility    Modified Rankin (Stroke Patients Only)       Balance Overall balance assessment: History of Falls;Modified Independent                                           Pertinent Vitals/Pain Pain Assessment: 0-10 Pain Score: 8  Pain  Location: R hip Pain Descriptors / Indicators: Sore Pain Intervention(s): Premedicated before session;Monitored during session;Limited activity within patient's tolerance    Home Living Family/patient expects to be discharged to:: Private residence Living Arrangements: Parent Available Help at Discharge: Family;Available 24 hours/day Type of Home: House Home Access: Stairs to enter Entrance Stairs-Rails: Right;Left;Can reach both Entrance Stairs-Number of Steps: 4 Home Layout: One level Home Equipment: Walker - 4 wheels;Wheelchair - Sport and exercise psychologist Comments: Lives with mother.     Prior Function Level of Independence: Needs assistance   Gait / Transfers Assistance Needed: rollator for short ambulation around the house, w/c for community mobility.   ADL's / Homemaking Assistance Needed: assistance with dressing/bathing sometimes        Hand Dominance   Dominant Hand: Right    Extremity/Trunk Assessment   Upper Extremity Assessment Upper Extremity Assessment: Overall WFL for tasks assessed    Lower Extremity Assessment Lower Extremity Assessment: RLE deficits/detail RLE Deficits / Details: knee ext -3/5 RLE: Unable to fully assess due to pain    Cervical / Trunk Assessment Cervical / Trunk Assessment: Normal  Communication   Communication: No difficulties  Cognition Arousal/Alertness: Awake/alert Behavior During Therapy: WFL for tasks assessed/performed Overall Cognitive Status: Within Functional Limits for tasks assessed  General Comments      Exercises     Assessment/Plan    PT Assessment Patent does not need any further PT services  PT Problem List         PT Treatment Interventions      PT Goals (Current goals can be found in the Care Plan section)  Acute Rehab PT Goals Patient Stated Goal: likes to write poetry PT Goal Formulation: All assessment and education complete, DC  therapy    Frequency     Barriers to discharge        Co-evaluation               AM-PAC PT "6 Clicks" Daily Activity  Outcome Measure Difficulty turning over in bed (including adjusting bedclothes, sheets and blankets)?: None Difficulty moving from lying on back to sitting on the side of the bed? : None Difficulty sitting down on and standing up from a chair with arms (e.g., wheelchair, bedside commode, etc,.)?: None Help needed moving to and from a bed to chair (including a wheelchair)?: None Help needed walking in hospital room?: None Help needed climbing 3-5 steps with a railing? : A Little 6 Click Score: 23    End of Session Equipment Utilized During Treatment: Gait belt Activity Tolerance: Patient tolerated treatment well Patient left: in bed;with call bell/phone within reach;with family/visitor present Nurse Communication: Mobility status      Time: 6754-4920 PT Time Calculation (min) (ACUTE ONLY): 22 min   Charges:   PT Evaluation $PT Eval Low Complexity: 1 Procedure     PT G Codes:   PT G-Codes **NOT FOR INPATIENT CLASS** Functional Assessment Tool Used: AM-PAC 6 Clicks Basic Mobility Functional Limitation: Mobility: Walking and moving around Mobility: Walking and Moving Around Current Status (F0071): At least 1 percent but less than 20 percent impaired, limited or restricted Mobility: Walking and Moving Around Goal Status [phone]): At least 1 percent but less than 20 percent impaired, limited or restricted      [place], Blane 03/17/2017, 11:24 AM [phone]

## 2017-03-17 NOTE — Progress Notes (Signed)
Subjective: Feels better this morning, but still have occasional, mild, substernal aching that is worse when she shifts around in bed, twists her torso, or moves her arms.  Eating, drinking, and breathing without difficulty.  Objective:  Vital signs in last 24 hours: Vitals:   03/16/17 1637 03/16/17 1958 03/17/17 0006 03/17/17 0414  BP: 108/67 121/68 119/71 (!) 113/50  Pulse: 81 81 79 79  Resp: 17     Temp: 98.8 F (37.1 C) 98.3 F (36.8 C) 97.8 F (36.6 C) 97.6 F (36.4 C)  TempSrc: Oral Oral Axillary Oral  SpO2: 97% 98% 99% 100%  Weight: 257 lb 4 oz (116.7 kg)   251 lb 8 oz (114.1 kg)  Height: 5\' 6"  (1.676 m)      Physical Exam  Constitutional: She is oriented to person, place, and time. She appears well-developed and well-nourished. No distress.  Cardiovascular: Normal rate and regular rhythm.   2/6 systolic murmur RUSB  Pulmonary/Chest: Effort normal and breath sounds normal.  Neurological: She is alert and oriented to person, place, and time. No cranial nerve deficit.  Psychiatric: She has a normal mood and affect. Her behavior is normal.   CBC Latest Ref Rng & Units 03/17/2017 03/16/2017 03/15/2017  WBC 4.0 - 10.5 K/uL 9.4 9.3 12.9(H)  Hemoglobin 12.0 - 15.0 g/dL 9.7(L) 9.6(L) 10.5(L)  Hematocrit 36.0 - 46.0 % 32.8(L) 32.8(L) 35.5(L)  Platelets 150 - 400 K/uL 427(H) 414(H) 494(H)   CMP Latest Ref Rng & Units 03/17/2017 03/16/2017 03/15/2017  Glucose 65 - 99 mg/dL 112(H) 97 112(H)  BUN 6 - 20 mg/dL 11 10 9   Creatinine 0.44 - 1.00 mg/dL 0.87 1.12(H) 1.44(H)  Sodium 135 - 145 mmol/L 137 136 139  Potassium 3.5 - 5.1 mmol/L 4.3 4.1 4.1  Chloride 101 - 111 mmol/L 99(L) 102 104  CO2 22 - 32 mmol/L 28 24 24   Calcium 8.9 - 10.3 mg/dL 9.2 8.6(L) 8.9  Total Protein 6.5 - 8.1 g/dL - - -  Total Bilirubin 0.3 - 1.2 mg/dL - - -  Alkaline Phos 38 - 126 U/L - - -  AST 15 - 41 U/L - - -  ALT 14 - 54 U/L - - -   Cardiac Panel (last 3 results)  Recent Labs  03/16/17 0226  03/16/17 0557 03/16/17 1426  TROPONINI <0.03 <0.03 <0.03   Component     Latest Ref Rng & Units 03/16/2017  C3 Complement     82 - 167 mg/dL 172 (H)   Component     Latest Ref Rng & Units 03/16/2017  Complement C4, Body Fluid     14 - 44 mg/dL 37    Assessment/Plan:  Active Problems:   Lupus (systemic lupus erythematosus) (HCC)   Atypical chest pain   Acute renal insufficiency   51 y.o. female with SLE and history of DVT/PE who presents with several days of atypical chest pain.  PE, MI, and lupus flare have been ruled out, her discomfort is most likely MSK pain.  #Atypical Chest Pain Improving. Most likely musculoskeletal with exacerbation by movement.  Mild pericarditis also possible in patient with SLE, but no EKG changes and stable inflammatory markers makes less likely.  No pleuritic chest pain to suggest pleuritis.  No PE on CTA, EKG without acute ischemic changes and serial negative troponins ruled out MI. -limited echo to evaluate for pericarditis -Voltaren gel to chest  #Acute Renal Insufficiency Improving.  Cr 1.44 on admission up from baseline ~0.9.  No proteinuria,  improving with good PO intake, likely mild prerenal azotemia, no lupus nephritis.  #SLE Inflammatory markers at baseline, complement levels not decreased, doe not seem to be having lupus flare. -F/u dsDNA -Continue home prednisone and hydroxychloroquine  #Chronic Osteomyelitis of R Hip Scheduled for further surgery in June, CT at admission stable without new absecess or fluid collection. -Continue home pain regimen -PT eval  #Abnormal Chest CT Mild multifocal mediastinal adenopathy, tubular low-density 2.4 x 1 cm structure in the left perihilar lung, and 4 mm left upper lobe nodule seen.  -Repeat CT versus PET-CT in 3 months  Fluids: none Diet: regular (per pt request) DVT Prophylaxis: lovenox Code Status: DNR  Dispo: Anticipated discharge in approximately 0-1 day(s).   Minus Liberty,  MD 03/17/2017, 9:56 AM Pager: [phone]

## 2017-03-17 NOTE — Care Management Obs Status (Signed)
[place] NOTIFICATION   Patient Details  Name: Katherine Banks MRN: [id number] Date of Birth: [birth year]   Medicare Observation Status Notification Given:  Yes    Bethena Roys, RN 03/17/2017, 3:19 PM

## 2017-03-17 NOTE — Progress Notes (Signed)
   03/17/17 1000  Clinical Encounter Type  Visited With Patient  Visit Type Initial  Referral From Patient  Consult/Referral To Chaplain  Spiritual Encounters  Spiritual Needs Prayer;Emotional  Stress Factors  Patient Stress Factors None identified  Family Stress Factors Health changes  Pt is going to be discharged today, has a concern for her mother whom is not well.  Offered prayer, ministry of presence, active listening.   Chaplain Hollace Michelli A.Nonnie Done, MA-PC, [phone]

## 2017-03-17 NOTE — Progress Notes (Signed)
Transitions of Care Pharmacy Note  Plan:  Reviewed current medication list with patient.  No barrier to care identified. --------------------------------------------- Katherine Banks is an 51 y.o. female who presents with a chief complaint of chest pain. In anticipation of discharge, pharmacy has reviewed this patient's prior to admission medication history, as well as current inpatient medications listed per the [facility].  Current medication indications, dosing, frequency, and notable side effects reviewed with patient and family. patient and family verbalized understanding of current inpatient medication regimen and are aware that the After Visit Summary when presented, will represent the most accurate medication list at discharge.     Assessment: Understanding of regimen: good Understanding of indications: good Potential of compliance: good Barriers to Obtaining Medications: No  Patient instructed to contact inpatient pharmacy team with further questions or concerns if needed.    Time spent preparing for discharge counseling: 10 minutes Time spent counseling patient: 10 minutes    Thank you for allowing pharmacy to be a part of this patient's care.  Ihor Austin, PharmD PGY1 Pharmacy Resident Pager: [phone]

## 2017-03-17 NOTE — Progress Notes (Signed)
  Echocardiogram 2D Echocardiogram has been performed.  Katherine Banks 03/17/2017, 2:37 PM

## 2017-03-20 ENCOUNTER — Encounter: Payer: Medicare HMO | Admitting: Internal Medicine

## 2017-03-20 ENCOUNTER — Other Ambulatory Visit: Payer: Self-pay | Admitting: Pharmacist

## 2017-03-20 ENCOUNTER — Encounter: Payer: Self-pay | Admitting: Internal Medicine

## 2017-03-20 DIAGNOSIS — M329 Systemic lupus erythematosus, unspecified: Secondary | ICD-10-CM

## 2017-03-20 MED ORDER — HYDROXYCHLOROQUINE SULFATE 200 MG PO TABS: 200.0000 mg | ORAL_TABLET | Freq: Two times a day (BID) | ORAL | 1 refills | Status: DC

## 2017-03-24 NOTE — Pre-Procedure Instructions (Signed)
Katherine Banks  03/24/2017      [id number] [address], [place] [address] Phone: [phone] Fax: [phone]    Your procedure is scheduled on April 04, 2017.  Report to [facility] Admitting at 1220 PM.  Call this number if you have problems the morning of surgery:[phone]   Remember:  Do not eat food or drink liquids after midnight.  Take these medicines the morning of surgery with A SIP OF WATER albuterol nebulizer-if needed, amlodipine (norvasc), cyclobenzaprine (flexeril), duloxetine (cymbalta), fluticasone (flonase) nasal spray and nebulizer treatment-if needed, gabapentin (neurontin), montelukast (singulair), hydroxychlorothiazide (plaquenil), oxycodone (roxicodone) -if needed for pain, ondansetron (zofran) if needed for nausea, eye drops.  7 days prior to surgery STOP taking any Aspirin, Aleve, Naproxen, Ibuprofen, Motrin, Advil, Goody's, BC's, all herbal medications, fish oil, and all vitamins   Do not wear jewelry, make-up or nail polish.  Do not wear lotions, powders, or perfumes, or deoderant.  Do not shave 48 hours prior to surgery.    Do not bring valuables to the hospital.  [facility] is not responsible for any belongings or valuables.  Contacts, dentures or bridgework may not be worn into surgery.  Leave your suitcase in the car.  After surgery it may be brought to your room.  For patients admitted to the hospital, discharge time will be determined by your treatment team.  Patients discharged the day of surgery will not be allowed to drive home.    Special instructions:   [place]- Preparing For Surgery  Before surgery, you can play an important role. Because skin is not sterile, your skin needs to be as free of germs as possible. You can reduce the number of germs on your skin by washing with CHG (chlorahexidine gluconate) Soap before surgery.  CHG is an antiseptic cleaner which kills  germs and bonds with the skin to continue killing germs even after washing.  Please do not use if you have an allergy to CHG or antibacterial soaps. If your skin becomes reddened/irritated stop using the CHG.  Do not shave (including legs and underarms) for at least 48 hours prior to first CHG shower. It is OK to shave your face.  Please follow these instructions carefully.   1. Shower the NIGHT BEFORE SURGERY and the MORNING OF SURGERY with CHG.   2. If you chose to wash your hair, wash your hair first as usual with your normal shampoo.  3. After you shampoo, rinse your hair and body thoroughly to remove the shampoo.  4. Use CHG as you would any other liquid soap. You can apply CHG directly to the skin and wash gently with a scrungie or a clean washcloth.   5. Apply the CHG Soap to your body ONLY FROM THE NECK DOWN.  Do not use on open wounds or open sores. Avoid contact with your eyes, ears, mouth and genitals (private parts). Wash genitals (private parts) with your normal soap.  6. Wash thoroughly, paying special attention to the area where your surgery will be performed.  7. Thoroughly rinse your body with warm water from the neck down.  8. DO NOT shower/wash with your normal soap after using and rinsing off the CHG Soap.  9. Pat yourself dry with a CLEAN TOWEL.   10. Wear CLEAN PAJAMAS   11. Place CLEAN SHEETS on your bed the night of your first shower and DO NOT SLEEP WITH PETS.  Day of Surgery: Do not apply any deodorants/lotions. Please wear clean clothes to the hospital/surgery center.    Please read over the following fact sheets that you were given. Pain Booklet, Coughing and Deep Breathing and MRSA Information

## 2017-03-24 NOTE — Progress Notes (Signed)
PCP:  Cardiologist:  EKG:  Stress test:  ECHO:  Cardiac Cath:  Chest x-ray 

## 2017-03-28 ENCOUNTER — Telehealth (INDEPENDENT_AMBULATORY_CARE_PROVIDER_SITE_OTHER): Payer: Self-pay

## 2017-03-28 ENCOUNTER — Inpatient Hospital Stay (HOSPITAL_COMMUNITY)
Admission: RE | Admit: 2017-03-28 | Discharge: 2017-03-28 | Disposition: A | Discharge status: Home / Self Care | Payer: Medicare HMO | Source: Ambulatory Visit

## 2017-03-28 NOTE — Telephone Encounter (Signed)
Message made in error.  Patient needed to speak with surgery scheduler.

## 2017-03-31 ENCOUNTER — Encounter (HOSPITAL_COMMUNITY)
Admission: RE | Admit: 2017-03-31 | Discharge: 2017-03-31 | Disposition: A | Discharge status: Home / Self Care | Payer: Medicare HMO | Source: Ambulatory Visit | Attending: Orthopedic Surgery | Admitting: Orthopedic Surgery

## 2017-03-31 ENCOUNTER — Encounter (HOSPITAL_COMMUNITY): Payer: Self-pay

## 2017-03-31 DIAGNOSIS — Z01818 Encounter for other preprocedural examination: Secondary | ICD-10-CM | POA: Diagnosis not present

## 2017-03-31 DIAGNOSIS — M869 Osteomyelitis, unspecified: Secondary | ICD-10-CM | POA: Diagnosis not present

## 2017-03-31 HISTORY — DX: Personal history of urinary calculi: Z87.442

## 2017-03-31 HISTORY — DX: Peripheral vascular disease, unspecified: I73.9

## 2017-03-31 HISTORY — DX: Cardiac murmur, unspecified: R01.1

## 2017-03-31 LAB — CBC
HEMATOCRIT: 31.9 % — AB (ref 36.0–46.0)
HEMOGLOBIN: 9.6 g/dL — AB (ref 12.0–15.0)
MCH: 20.8 pg — ABNORMAL LOW (ref 26.0–34.0)
MCHC: 30.1 g/dL (ref 30.0–36.0)
MCV: 69.2 fL — AB (ref 78.0–100.0)
Platelets: 354 10*3/uL (ref 150–400)
RBC: 4.61 MIL/uL (ref 3.87–5.11)
RDW: 21.6 % — ABNORMAL HIGH (ref 11.5–15.5)
WBC: 8.8 10*3/uL (ref 4.0–10.5)

## 2017-03-31 LAB — BASIC METABOLIC PANEL
ANION GAP: 9 (ref 5–15)
BUN: 7 mg/dL (ref 6–20)
CHLORIDE: 105 mmol/L (ref 101–111)
CO2: 25 mmol/L (ref 22–32)
Calcium: 9.2 mg/dL (ref 8.9–10.3)
Creatinine, Ser: 0.8 mg/dL (ref 0.44–1.00)
GFR calc Af Amer: >60 mL/min (ref >60)
GFR calc non Af Amer: >60 mL/min (ref >60)
Glucose, Bld: 81 mg/dL (ref 65–99)
POTASSIUM: 4.2 mmol/L (ref 3.5–5.1)
SODIUM: 139 mmol/L (ref 135–145)

## 2017-03-31 LAB — PREPARE RBC (CROSSMATCH)

## 2017-03-31 NOTE — Pre-Procedure Instructions (Addendum)
Katherine Banks  03/31/2017      [id number] [place], [place] [address] Phone: [phone] Fax: [phone]    Your procedure is scheduled on April 04, 2017.  Report to [facility] Admitting at 3:00 PM.  Call this number if you have problems the morning of surgery:[phone]   Remember:  Do not eat food or drink liquids after midnight.  Take these medicines the morning of surgery with A SIP OF WATER albuterol nebulizeramlodipine (norvasc),  nebulizer treatment-if needed, gabapentin (neurontin),   (plaquenil), oxycodone (roxicodone) -if needed for pain, eye drops.breo if needed   prior to surgery starting Today 03/31/17 STOP taking any Aspirin, Aleve, Naproxen, Ibuprofen, Motrin, Advil, Goody's, BC's, all herbal medications, fish oil, and all vitamins,diclofenac(voltaren)   Do not wear jewelry, make-up or nail polish.  Do not wear lotions, powders, or perfumes, or deoderant.  Do not shave 48 hours prior to surgery.    Do not bring valuables to the hospital.  [facility] is not responsible for any belongings or valuables.  Contacts, dentures or bridgework may not be worn into surgery.  Leave your suitcase in the car.  After surgery it may be brought to your room.  For patients admitted to the hospital, discharge time will be determined by your treatment team.  Patients discharged the day of surgery will not be allowed to drive home.    Special instructions:   [place]- Preparing For Surgery  Before surgery, you can play an important role. Because skin is not sterile, your skin needs to be as free of germs as possible. You can reduce the number of germs on your skin by washing with CHG (chlorahexidine gluconate) Soap before surgery.  CHG is an antiseptic cleaner which kills germs and bonds with the skin to continue killing germs even after washing.  Please do not use if you have an allergy to CHG or  antibacterial soaps. If your skin becomes reddened/irritated stop using the CHG.  Do not shave (including legs and underarms) for at least 48 hours prior to first CHG shower. It is OK to shave your face.  Please follow these instructions carefully.   1. Shower the NIGHT BEFORE SURGERY and the MORNING OF SURGERY with CHG.   2. If you chose to wash your hair, wash your hair first as usual with your normal shampoo.  3. After you shampoo, rinse your hair and body thoroughly to remove the shampoo.  4. Use CHG as you would any other liquid soap. You can apply CHG directly to the skin and wash gently with a scrungie or a clean washcloth.   5. Apply the CHG Soap to your body ONLY FROM THE NECK DOWN.  Do not use on open wounds or open sores. Avoid contact with your eyes, ears, mouth and genitals (private parts). Wash genitals (private parts) with your normal soap.  6. Wash thoroughly, paying special attention to the area where your surgery will be performed.  7. Thoroughly rinse your body with warm water from the neck down.  8. DO NOT shower/wash with your normal soap after using and rinsing off the CHG Soap.  9. Pat yourself dry with a CLEAN TOWEL.   10. Wear CLEAN PAJAMAS   11. Place CLEAN SHEETS on your bed the night of your first shower and DO NOT SLEEP WITH PETS.    Day of Surgery: Do not apply any deodorants/lotions. Please wear clean  clothes to the hospital/surgery center.    Please read over the following fact sheets that you were given. Pain Booklet, Coughing and Deep Breathing and MRSA Information

## 2017-04-03 ENCOUNTER — Telehealth (INDEPENDENT_AMBULATORY_CARE_PROVIDER_SITE_OTHER): Payer: Self-pay | Admitting: Orthopedic Surgery

## 2017-04-03 ENCOUNTER — Encounter (HOSPITAL_COMMUNITY): Payer: Self-pay | Admitting: Vascular Surgery

## 2017-04-03 MED ORDER — DEXTROSE 5 % IV SOLN
3.0000 g | INTRAVENOUS | Status: DC
  Filled 2017-04-03: qty 3000

## 2017-04-03 NOTE — Progress Notes (Signed)
Esthesia chart review: Patient is a 26 old female scheduled for right proximal femur resection on 04/04/2017 by Dr. [name].    History includes former smoker (quit '84), lupus (SLE), DVT '12 and '16 (RLE), PE s/p IVC filter ~ '13, HTN, asthma, chronic bronchitis, PTSD, anxiety, anemia, migraines, uterine cancer s/p hysterectomy '97, murmur, seizure '81 and '16 (reportedly saw neurologist in [place] in 2016 and no etiology found), cholecystectomy '90, THA '12 with multiple subsequent surgeries (chronic osteomyelitis; last I&D right hip 11/24/15, 11/19/15, 09/06/16). BMI is consistent with morbid obesity. Admission 03/16/17-03/17/17 for atypical chest pain (musculoskeletal; negative troponin, + d-dimer but negative PE by CTA, acute renal insufficiency (improved with hydration). Cardiologist Dr. Sallyanne Kuster did evaluate and recommended a limited echo to rule out pericardial effusion/pericarditis give her history of SLE. Echo did not show a pericardial effusion. CTA did show nonspecific mediastinal adenopathy, 4 mm LUL nodule, and left perihilar low density structure with 3 month follow-up recommended.    PCP is listed as [name], MD with [facility].   Meds include albuterol, amlodipine, Flexeril, Bentyl, Cymbalta, ferrous sulfate, Flonase, Neurontin, HCTZ, Plaquenil, Breo Ellipta, Singulair, Prilosec, oxycodone, prednisone, Ambien.   BP 130/79   Pulse 80   Temp 36.7 C (Oral)   Resp 18   Ht 5\' 6"  (1.676 m)   Wt 261 lb 3 oz (118.5 kg)   SpO2 98%   BMI 42.16 kg/m    EKG 03/16/17: SR, borderline short PR interval, probable LVH with secondary repolarization abnormality. Non-specific T wave abnormality.  Limited Echo 03/17/17: Study Conclusions - Left ventricle: The cavity size was normal. There was mild focal   basal hypertrophy of the septum. Systolic function was vigorous.   The estimated ejection fraction was in the range of 65% to 70%.   Wall motion was normal; there were no regional  wall motion   abnormalities. Doppler parameters are consistent with abnormal   left ventricular relaxation (grade 1 diastolic dysfunction). - Mitral valve: Calcified annulus. Impressions: - Limited study to R/O pericardial effusion; full doppler not   performed; vigorous LV systolic function; mild diastolic   dysfunction; no pericardial effusion.  Echo 07/03/16: Study Conclusions - Left ventricle: The cavity size was normal. Wall thickness was   normal. Systolic function was normal. The estimated ejection   fraction was in the range of 60% to 65%. Wall motion was normal;   there were no regional wall motion abnormalities. Doppler   parameters are consistent with abnormal left ventricular   relaxation (grade 1 diastolic dysfunction). - Left atrium: The atrium was mildly dilated.  CTA chest 03/16/17: IMPRESSION: 1.  No pulmonary embolus. 2. Mild multifocal mediastinal adenopathy. This is nonspecific, and may be reactive, however given listed history of uterine cancer, three-month follow-up CT versus PET-CT is recommended to evaluate for metabolic activity. 3. Tubular low-density 2.4 x 1 cm structure in the left perihilar lung. This may reflect focal mucoid impaction, as irregular densities extend peripherally to the pleura, however can be reassessed on follow-up. The 4 mm left upper lobe nodule can also be reassessed on follow-up. 4. Bronchial thickening, suggesting bronchitis, of uncertain acuity. Heterogeneous opacities at the lung bases likely atelectasis.  CXR 03/15/17: IMPRESSION: Low lung volumes with bronchovascular crowding. Difficult to exclude vascular congestion. Elevated left hemidiaphragm with adjacent left basilar atelectasis.  Preoperative labs noted. BMET WNL. H/H 9.6/31.9, which appears overall stable when compared with labs since 08/2016. T&C done.   I called and left a voice  message for patient to call me in attempts to clarify history, but based on currently  available information I would anticipate that she can proceed as planned. Her anemia appears stable. And her recent evaluation for chest pain (with cardiology input) was felt to be atypical and most consistent with musculoskeletal etiology. No further testing ordered following echo. She has had three prior orthopedic surgeries, last six months ago. Further evaluation by her anesthesiologist on the day of surgery.  [facility] Short Stay Center/Anesthesiology Phone [phone] 04/03/2017 2:01 PM
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
## 2017-04-03 NOTE — Telephone Encounter (Signed)
Patient would like to speak to you regarding exactly what will happen during surgery tomorrow and much of her leg will be operated on. Please give pt a call. CB# [phone]

## 2017-04-03 NOTE — Telephone Encounter (Signed)
I called and discussed with her the pros and cons of doing this procedure.  With the shortening of the femur she may or may not be able to walk well again.  This procedure may or may not eradicate the infection.  It's really difficult proposition to answer fully and completely.  Without it though I think she is heading for some type of hip disarticulation in order to eradicate the infection.  Soft tissue envelope definitely increases the degree of difficulty with this procedure.  All questions answered.

## 2017-04-04 ENCOUNTER — Encounter (HOSPITAL_COMMUNITY): Admission: RE | Payer: Self-pay | Source: Ambulatory Visit

## 2017-04-04 ENCOUNTER — Inpatient Hospital Stay (HOSPITAL_COMMUNITY): Admission: RE | Admit: 2017-04-04 | Payer: Medicare HMO | Source: Ambulatory Visit | Admitting: Orthopedic Surgery

## 2017-04-04 SURGERY — EXCISION, OSTEOCHONDROMA
Anesthesia: General | Site: Hip | Laterality: Right

## 2017-04-06 LAB — TYPE AND SCREEN
ABO/RH(D): A POS
Antibody Screen: NEGATIVE
UNIT DIVISION: 0
UNIT DIVISION: 0

## 2017-04-06 LAB — BPAM RBC
BLOOD PRODUCT EXPIRATION DATE: 201806132359
Blood Product Expiration Date: 201806132359
ISSUE DATE / TIME: 201805300739
ISSUE DATE / TIME: 201805300739
UNIT TYPE AND RH: 6200
Unit Type and Rh: 6200

## 2017-04-10 DIAGNOSIS — M79604 Pain in right leg: Secondary | ICD-10-CM | POA: Diagnosis not present

## 2017-04-10 DIAGNOSIS — G894 Chronic pain syndrome: Secondary | ICD-10-CM | POA: Diagnosis not present

## 2017-04-10 DIAGNOSIS — M25551 Pain in right hip: Secondary | ICD-10-CM | POA: Diagnosis not present

## 2017-04-13 ENCOUNTER — Encounter (INDEPENDENT_AMBULATORY_CARE_PROVIDER_SITE_OTHER): Payer: Self-pay | Admitting: Orthopedic Surgery

## 2017-04-13 ENCOUNTER — Ambulatory Visit (INDEPENDENT_AMBULATORY_CARE_PROVIDER_SITE_OTHER): Payer: Medicare HMO | Admitting: Orthopedic Surgery

## 2017-04-13 ENCOUNTER — Telehealth (INDEPENDENT_AMBULATORY_CARE_PROVIDER_SITE_OTHER): Payer: Self-pay

## 2017-04-13 DIAGNOSIS — M869 Osteomyelitis, unspecified: Secondary | ICD-10-CM

## 2017-04-13 NOTE — Telephone Encounter (Signed)
Patient saw Dr Marlou Sa this morning. She is ready to proceed with surgery. Please call her to schedule Cell- [phone] Home-[phone]
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
## 2017-04-14 ENCOUNTER — Other Ambulatory Visit (INDEPENDENT_AMBULATORY_CARE_PROVIDER_SITE_OTHER): Payer: Self-pay | Admitting: Orthopedic Surgery

## 2017-04-14 ENCOUNTER — Encounter (INDEPENDENT_AMBULATORY_CARE_PROVIDER_SITE_OTHER): Payer: Self-pay | Admitting: Orthopedic Surgery

## 2017-04-14 DIAGNOSIS — M86151 Other acute osteomyelitis, right femur: Secondary | ICD-10-CM

## 2017-04-17 ENCOUNTER — Inpatient Hospital Stay (INDEPENDENT_AMBULATORY_CARE_PROVIDER_SITE_OTHER): Payer: Medicare Other | Admitting: Orthopedic Surgery

## 2017-04-29 DIAGNOSIS — K08409 Partial loss of teeth, unspecified cause, unspecified class: Secondary | ICD-10-CM | POA: Diagnosis not present

## 2017-04-29 DIAGNOSIS — J4520 Mild intermittent asthma, uncomplicated: Secondary | ICD-10-CM | POA: Diagnosis not present

## 2017-04-29 DIAGNOSIS — Z79899 Other long term (current) drug therapy: Secondary | ICD-10-CM | POA: Diagnosis not present

## 2017-04-29 DIAGNOSIS — R69 Illness, unspecified: Secondary | ICD-10-CM | POA: Diagnosis not present

## 2017-04-29 DIAGNOSIS — Z7952 Long term (current) use of systemic steroids: Secondary | ICD-10-CM | POA: Diagnosis not present

## 2017-04-29 DIAGNOSIS — M329 Systemic lupus erythematosus, unspecified: Secondary | ICD-10-CM | POA: Diagnosis not present

## 2017-04-29 DIAGNOSIS — M62838 Other muscle spasm: Secondary | ICD-10-CM | POA: Diagnosis not present

## 2017-04-29 DIAGNOSIS — Z0000 Encounter for general adult medical examination without abnormal findings: Secondary | ICD-10-CM | POA: Diagnosis not present

## 2017-04-29 DIAGNOSIS — K219 Gastro-esophageal reflux disease without esophagitis: Secondary | ICD-10-CM | POA: Diagnosis not present

## 2017-04-29 DIAGNOSIS — I10 Essential (primary) hypertension: Secondary | ICD-10-CM | POA: Diagnosis not present

## 2017-04-29 DIAGNOSIS — M009 Pyogenic arthritis, unspecified: Secondary | ICD-10-CM | POA: Diagnosis not present

## 2017-04-29 DIAGNOSIS — T829XXD Unspecified complication of cardiac and vascular prosthetic device, implant and graft, subsequent encounter: Secondary | ICD-10-CM | POA: Diagnosis not present

## 2017-04-29 DIAGNOSIS — Z79891 Long term (current) use of opiate analgesic: Secondary | ICD-10-CM | POA: Diagnosis not present

## 2017-05-07 ENCOUNTER — Other Ambulatory Visit: Payer: Self-pay | Admitting: Internal Medicine

## 2017-05-09 NOTE — Telephone Encounter (Signed)
Unclear reason for Rx. She certainly has documented chronic pain and this is likely the reason.  Will given 1 month supply and will need further documentation with PCP.

## 2017-05-10 DIAGNOSIS — M79604 Pain in right leg: Secondary | ICD-10-CM | POA: Diagnosis not present

## 2017-05-10 DIAGNOSIS — G894 Chronic pain syndrome: Secondary | ICD-10-CM | POA: Diagnosis not present

## 2017-05-10 DIAGNOSIS — M25551 Pain in right hip: Secondary | ICD-10-CM | POA: Diagnosis not present

## 2017-05-12 NOTE — Pre-Procedure Instructions (Signed)
[name]  05/12/2017      [id number] - [place], [place] [address] Phone: [phone] Fax: [phone]    Your procedure is scheduled on May 23, 2017.  Report to [facility] Admitting at 1030 AM.  Call this number if you have problems the morning of surgery:  [phone]   Remember:  Do not eat food or drink liquids after midnight.  Take these medicines the morning of surgery with A SIP OF WATER albuterol nebulizer, fluticasone (Breo Ellipta) inhaler <bring inhalers with you>,  amlodipine (norvasc), cyclobenzaprine (flexeril), dicyclomine (bentyl), diphenhydramine (benadryl), duloxetine (cymbalta), fluticasone (flonase) nasal spray, gabapentin (neurontin), hydroxychloroquine (plaquenil), monetelukast (singulair), omeprazole (prilosec), ondansetron (zofran), oxycodone (roxicodone)-if needed for pain, oxycodone ER, prednisone (deltasone), trimethoprim (polytrim) eye drops.  7 days prior to surgery STOP taking any diclofenac (voltaren),  Aspirin, Aleve, Naproxen, Ibuprofen, Motrin, Advil, Goody's, BC's, all herbal medications, fish oil, and all vitamins   Do not wear jewelry, make-up or nail polish.  Do not wear lotions, powders, or perfumes, or deoderant.  Do not shave 48 hours prior to surgery.    Do not bring valuables to the hospital.  [facility] is not responsible for any belongings or valuables.  Contacts, dentures or bridgework may not be worn into surgery.  Leave your suitcase in the car.  After surgery it may be brought to your room.  For patients admitted to the hospital, discharge time will be determined by your treatment team.  Patients discharged the day of surgery will not be allowed to drive home.   Special instructions:   [place]- Preparing For Surgery  Before surgery, you can play an important role. Because skin is not sterile, your skin needs to be as free of germs as possible.  You can reduce the number of germs on your skin by washing with CHG (chlorahexidine gluconate) Soap before surgery.  CHG is an antiseptic cleaner which kills germs and bonds with the skin to continue killing germs even after washing.  Please do not use if you have an allergy to CHG or antibacterial soaps. If your skin becomes reddened/irritated stop using the CHG.  Do not shave (including legs and underarms) for at least 48 hours prior to first CHG shower. It is OK to shave your face.  Please follow these instructions carefully.   1. Shower the NIGHT BEFORE SURGERY and the MORNING OF SURGERY with CHG.   2. If you chose to wash your hair, wash your hair first as usual with your normal shampoo.  3. After you shampoo, rinse your hair and body thoroughly to remove the shampoo.  4. Use CHG as you would any other liquid soap. You can apply CHG directly to the skin and wash gently with a scrungie or a clean washcloth.   5. Apply the CHG Soap to your body ONLY FROM THE NECK DOWN.  Do not use on open wounds or open sores. Avoid contact with your eyes, ears, mouth and genitals (private parts). Wash genitals (private parts) with your normal soap.  6. Wash thoroughly, paying special attention to the area where your surgery will be performed.  7. Thoroughly rinse your body with warm water from the neck down.  8. DO NOT shower/wash with your normal soap after using and rinsing off the CHG Soap.  9. Pat yourself dry with a CLEAN TOWEL.   10. Wear CLEAN PAJAMAS   11. Place CLEAN SHEETS on  your bed the night of your first shower and DO NOT SLEEP WITH PETS.    Day of Surgery: Do not apply any deodorants/lotions. Please wear clean clothes to the hospital/surgery center.     Please read over the following fact sheets that you were given. Pain Booklet, Coughing and Deep Breathing and Surgical Site Infection Prevention

## 2017-05-15 ENCOUNTER — Ambulatory Visit: Payer: Medicare HMO

## 2017-05-15 ENCOUNTER — Inpatient Hospital Stay (HOSPITAL_COMMUNITY)
Admission: RE | Admit: 2017-05-15 | Discharge: 2017-05-15 | Disposition: A | Discharge status: Home / Self Care | Payer: Medicare HMO | Source: Ambulatory Visit

## 2017-05-18 ENCOUNTER — Encounter (HOSPITAL_COMMUNITY)
Admission: RE | Admit: 2017-05-18 | Discharge: 2017-05-18 | Disposition: A | Discharge status: Home / Self Care | Payer: Medicare HMO | Source: Ambulatory Visit | Attending: Orthopedic Surgery | Admitting: Orthopedic Surgery

## 2017-05-18 ENCOUNTER — Encounter (HOSPITAL_COMMUNITY): Payer: Self-pay

## 2017-05-18 ENCOUNTER — Other Ambulatory Visit (HOSPITAL_COMMUNITY): Payer: Self-pay | Admitting: *Deleted

## 2017-05-18 DIAGNOSIS — Z87891 Personal history of nicotine dependence: Secondary | ICD-10-CM | POA: Insufficient documentation

## 2017-05-18 DIAGNOSIS — Z7982 Long term (current) use of aspirin: Secondary | ICD-10-CM | POA: Insufficient documentation

## 2017-05-18 DIAGNOSIS — Z0183 Encounter for blood typing: Secondary | ICD-10-CM | POA: Insufficient documentation

## 2017-05-18 DIAGNOSIS — F419 Anxiety disorder, unspecified: Secondary | ICD-10-CM | POA: Diagnosis not present

## 2017-05-18 DIAGNOSIS — Z86718 Personal history of other venous thrombosis and embolism: Secondary | ICD-10-CM | POA: Insufficient documentation

## 2017-05-18 DIAGNOSIS — Z90710 Acquired absence of both cervix and uterus: Secondary | ICD-10-CM | POA: Diagnosis not present

## 2017-05-18 DIAGNOSIS — Z7952 Long term (current) use of systemic steroids: Secondary | ICD-10-CM | POA: Insufficient documentation

## 2017-05-18 DIAGNOSIS — Z01818 Encounter for other preprocedural examination: Secondary | ICD-10-CM | POA: Diagnosis not present

## 2017-05-18 DIAGNOSIS — Z9049 Acquired absence of other specified parts of digestive tract: Secondary | ICD-10-CM | POA: Insufficient documentation

## 2017-05-18 DIAGNOSIS — F4310 Post-traumatic stress disorder, unspecified: Secondary | ICD-10-CM | POA: Diagnosis not present

## 2017-05-18 DIAGNOSIS — I10 Essential (primary) hypertension: Secondary | ICD-10-CM | POA: Insufficient documentation

## 2017-05-18 DIAGNOSIS — Z96641 Presence of right artificial hip joint: Secondary | ICD-10-CM | POA: Diagnosis not present

## 2017-05-18 DIAGNOSIS — M86651 Other chronic osteomyelitis, right thigh: Secondary | ICD-10-CM | POA: Diagnosis not present

## 2017-05-18 DIAGNOSIS — J45909 Unspecified asthma, uncomplicated: Secondary | ICD-10-CM | POA: Insufficient documentation

## 2017-05-18 DIAGNOSIS — M329 Systemic lupus erythematosus, unspecified: Secondary | ICD-10-CM | POA: Diagnosis not present

## 2017-05-18 DIAGNOSIS — Z79899 Other long term (current) drug therapy: Secondary | ICD-10-CM | POA: Insufficient documentation

## 2017-05-18 DIAGNOSIS — Z8542 Personal history of malignant neoplasm of other parts of uterus: Secondary | ICD-10-CM | POA: Diagnosis not present

## 2017-05-18 DIAGNOSIS — Z7951 Long term (current) use of inhaled steroids: Secondary | ICD-10-CM | POA: Diagnosis not present

## 2017-05-18 DIAGNOSIS — Z01812 Encounter for preprocedural laboratory examination: Secondary | ICD-10-CM | POA: Insufficient documentation

## 2017-05-18 HISTORY — DX: Presence of other vascular implants and grafts: Z95.828

## 2017-05-18 HISTORY — DX: Other specified postprocedural states: Z98.890

## 2017-05-18 LAB — COMPREHENSIVE METABOLIC PANEL
ALK PHOS: 138 U/L — AB (ref 38–126)
ALT: 18 U/L (ref 14–54)
ANION GAP: 7 (ref 5–15)
AST: 22 U/L (ref 15–41)
Albumin: 3.3 g/dL — ABNORMAL LOW (ref 3.5–5.0)
BILIRUBIN TOTAL: 0.3 mg/dL (ref 0.3–1.2)
BUN: 7 mg/dL (ref 6–20)
CALCIUM: 8.9 mg/dL (ref 8.9–10.3)
CO2: 26 mmol/L (ref 22–32)
CREATININE: 0.84 mg/dL (ref 0.44–1.00)
Chloride: 106 mmol/L (ref 101–111)
Glucose, Bld: 125 mg/dL — ABNORMAL HIGH (ref 65–99)
Potassium: 4 mmol/L (ref 3.5–5.1)
SODIUM: 139 mmol/L (ref 135–145)
TOTAL PROTEIN: 7 g/dL (ref 6.5–8.1)

## 2017-05-18 LAB — SURGICAL PCR SCREEN
MRSA, PCR: NEGATIVE
Staphylococcus aureus: NEGATIVE

## 2017-05-18 LAB — CBC
HEMATOCRIT: 31.4 % — AB (ref 36.0–46.0)
Hemoglobin: 9.3 g/dL — ABNORMAL LOW (ref 12.0–15.0)
MCH: 21.3 pg — AB (ref 26.0–34.0)
MCHC: 29.6 g/dL — AB (ref 30.0–36.0)
MCV: 72 fL — AB (ref 78.0–100.0)
Platelets: 321 10*3/uL (ref 150–400)
RBC: 4.36 MIL/uL (ref 3.87–5.11)
RDW: 20.1 % — ABNORMAL HIGH (ref 11.5–15.5)
WBC: 8.8 10*3/uL (ref 4.0–10.5)

## 2017-05-18 NOTE — Progress Notes (Signed)
   05/18/17 1450  OBSTRUCTIVE SLEEP APNEA  Have you ever been diagnosed with sleep apnea through a sleep study? No  Do you snore loudly (loud enough to be heard through closed doors)?  1  Do you often feel tired, fatigued, or sleepy during the daytime (such as falling asleep during driving or talking to someone)? 0  Has anyone observed you stop breathing during your sleep? 0  Do you have, or are you being treated for high blood pressure? 1  BMI more than 35 kg/m2? 1  Age > 50 (1-yes) 1  Neck circumference greater than:Female 16 inches or larger, Female 17inches or larger? 1  Female Gender (Yes=1) 0  Obstructive Sleep Apnea Score 5  Score 5 or greater  Results sent to PCP

## 2017-05-18 NOTE — Pre-Procedure Instructions (Signed)
Katherine Banks  05/18/2017      [id number] - [place], [place] [address] Phone: [phone] Fax: [phone]    Your procedure is scheduled on May 23, 2017.  Report to [facility] Admitting at 1100 AM.  Call this number if you have problems the morning of surgery:  [phone]   Remember:  Do not eat food or drink liquids after midnight.  Take these medicines the morning of surgery with A SIP OF WATER albuterol nebulizer, fluticasone (Breo Ellipta) inhaler <bring inhalers with you>,  amlodipine (norvasc), cyclobenzaprine (flexeril), dicyclomine (bentyl), diphenhydramine (benadryl), duloxetine (cymbalta), fluticasone (flonase) nasal spray, gabapentin (neurontin), hydroxychloroquine (plaquenil), monetelukast (singulair), omeprazole (prilosec), ondansetron (zofran), oxycodone (roxicodone)-if needed for pain, oxycodone ER, prednisone (deltasone), trimethoprim (polytrim) eye drops.  7 days prior to surgery STOP taking any diclofenac (voltaren),  Aspirin, Aleve, Naproxen, Ibuprofen, Motrin, Advil, Goody's, BC's, all herbal medications, fish oil, and all vitamins   Do not wear jewelry, make-up or nail polish.  Do not wear lotions, powders, or perfumes, or deoderant.  Do not shave 48 hours prior to surgery.    Do not bring valuables to the hospital.  [facility] is not responsible for any belongings or valuables.  Contacts, dentures or bridgework may not be worn into surgery.  Leave your suitcase in the car.  After surgery it may be brought to your room.  For patients admitted to the hospital, discharge time will be determined by your treatment team.  Patients discharged the day of surgery will not be allowed to drive home.   Special instructions:   [place]- Preparing For Surgery  Before surgery, you can play an important role. Because skin is not sterile, your skin needs to be as free of germs as possible.  You can reduce the number of germs on your skin by washing with CHG (chlorahexidine gluconate) Soap before surgery.  CHG is an antiseptic cleaner which kills germs and bonds with the skin to continue killing germs even after washing.  Please do not use if you have an allergy to CHG or antibacterial soaps. If your skin becomes reddened/irritated stop using the CHG.  Do not shave (including legs and underarms) for at least 48 hours prior to first CHG shower. It is OK to shave your face.  Please follow these instructions carefully.   1. Shower the NIGHT BEFORE SURGERY and the MORNING OF SURGERY with CHG.   2. If you chose to wash your hair, wash your hair first as usual with your normal shampoo.  3. After you shampoo, rinse your hair and body thoroughly to remove the shampoo.  4. Use CHG as you would any other liquid soap. You can apply CHG directly to the skin and wash gently with a scrungie or a clean washcloth.   5. Apply the CHG Soap to your body ONLY FROM THE NECK DOWN.  Do not use on open wounds or open sores. Avoid contact with your eyes, ears, mouth and genitals (private parts). Wash genitals (private parts) with your normal soap.  6. Wash thoroughly, paying special attention to the area where your surgery will be performed.  7. Thoroughly rinse your body with warm water from the neck down.  8. DO NOT shower/wash with your normal soap after using and rinsing off the CHG Soap.  9. Pat yourself dry with a CLEAN TOWEL.   10. Wear CLEAN PAJAMAS   11. Place CLEAN SHEETS on  your bed the night of your first shower and DO NOT SLEEP WITH PETS.    Day of Surgery: Do not apply any deodorants/lotions. Please wear clean clothes to the hospital/surgery center.     Please read over the following fact sheets that you were given. Pain Booklet, Coughing and Deep Breathing and Surgical Site Infection Prevention

## 2017-05-18 NOTE — Progress Notes (Signed)
PCP - [name] Cardiologist - [place]  Chest x-ray - 04/15/17 EKG - 03/14/17 Stress Test - requesting ECHO - 03/17/17  Cardiac Cath - requesting  Sleep Study - was supposed to have done last month but cancelled   Patient verbalized understanding of instructions that were given to them at the PAT appointment. Patient was also instructed that they will need to review over the PAT instructions again at home before surgery.

## 2017-05-19 ENCOUNTER — Encounter (HOSPITAL_COMMUNITY): Payer: Self-pay | Admitting: Emergency Medicine

## 2017-05-19 ENCOUNTER — Encounter (HOSPITAL_COMMUNITY): Payer: Self-pay | Admitting: Vascular Surgery

## 2017-05-19 NOTE — Progress Notes (Signed)
Anesthesia chart review: Patient is a 39 old female scheduled for right proximal femur resection on 05/23/17 by [name]. Surgery was initially scheduled for 04/04/17, but it appears patient postponed to consider her options further. She has chronic right proximal femur osteomyelitis.   History includes former smoker (quit '84), lupus (SLE), DVT '12 and '16 (RLE), PE s/p IVC filter ~ '13, HTN, asthma, chronic bronchitis, PTSD, anxiety, anemia, migraines, uterine cancer s/p hysterectomy '97, murmur, seizure '81 and '16 (reportedly saw neurologist in [place] in 2016 and no etiology found), cholecystectomy '90, THA '12 with multiple subsequent surgeries (chronic osteomyelitis; last I&D right hip 11/24/15, 11/19/15, 09/06/16). BMI is consistent with morbid obesity. OSA screening score 5.  - Admission 03/16/17-03/17/17 for atypical chest pain (musculoskeletal; negative troponin, + d-dimer but negative PE by CTA, acute renal insufficiency (improved with hydration). Cardiologist Dr. Sallyanne Kuster did evaluate and recommended a limited echo to rule out pericardial effusion/pericarditis give her history of SLE. Echo did not show a pericardial effusion. CTA did show nonspecific mediastinal adenopathy, 4 mm LUL nodule, and left perihilar low density structure with 3 month follow-up recommended.    PCP is listed as [name], MD with [facility].   Meds include albuterol, amlodipine, ASA 81 mg, Flexeril, Bentyl, Benadryl, Cymbalta, ferrous sulfate, Flonase, Neurontin, HCTZ, hydromorphone PRN, Plaquenil, Breo Ellipta, Singulair, Prilosec, oxycodone Q 12 hours, prednisone 5 mg QID, Ambien.   BP 117/76   Pulse 79   Temp 36.7 C   Resp 20   Ht 5\' 6"  (1.676 m)   Wt 269 lb 11.2 oz (122.3 kg)   SpO2 96%   BMI 43.53 kg/m   EKG 03/16/17: SR, borderline short PR interval, probable LVH with secondary repolarization abnormality. Non-specific T wave abnormality.   Patient thought she had a stress test within five  year and cardiac cath ~ 5-7 years ago at [facility] in [place], [place]. [facility] records dating back to 07/11/12 can be viewed in [place] and there is no record of cardiac cath or stress test. Also no cardiology encounters. She did have a chest CTA and EKGs.  I also called and spoke with staff in medical records there and they went back 10 years and did not see record of a stress test or cardiac cath. Patient reported them as "okay."   Limited Echo 03/17/17: Study Conclusions - Left ventricle: The cavity size was normal. There was mild focal basal hypertrophy of the septum. Systolic function was vigorous. The estimated ejection fraction was in the range of 65% to 70%. Wall motion was normal; there were no regional wall motion abnormalities. Doppler parameters are consistent with abnormal left ventricular relaxation (grade 1 diastolic dysfunction). - Mitral valve: Calcified annulus. Impressions: - Limited study to R/O pericardial effusion; full doppler not performed; vigorous LV systolic function; mild diastolic dysfunction; no pericardial effusion.  Echo 07/03/16: Study Conclusions - Left ventricle: The cavity size was normal. Wall thickness was normal. Systolic function was normal. The estimated ejection fraction was in the range of 60% to 65%. Wall motion was normal; there were no regional wall motion abnormalities. Doppler parameters are consistent with abnormal left ventricular relaxation (grade 1 diastolic dysfunction). - Left atrium: The atrium was mildly dilated.  CTA chest 03/16/17: IMPRESSION: 1. No pulmonary embolus. 2. Mild multifocal mediastinal adenopathy. This is nonspecific, and may be reactive, however given listed history of uterine cancer, three-month follow-up CT versus PET-CT is recommended to evaluate for metabolic activity. 3. Tubular low-density  2.4 x 1 cm structure in the left perihilar lung. This may reflect  focal mucoid impaction, as irregular densities extend peripherally to the pleura, however can be reassessed on follow-up. The 4 mm left upper lobe nodule can also be reassessed on follow-up. 4. Bronchial thickening, suggesting bronchitis, of uncertain acuity. Heterogeneous opacities at the lung bases likely atelectasis.  CXR 03/15/17: IMPRESSION: Low lung volumes with bronchovascular crowding. Difficult to exclude vascular congestion. Elevated left hemidiaphragm with adjacent left basilar atelectasis.  Preoperative labs noted. Na 139, K 4.0, glucose 125, Cr 0.84, albumin 3.3, AST 22, ALT 18. WBC 8.8, H/H 9.3/31.4 (previously 9.6/31.9 on 03/31/17; H/H trends since 01/25/17 8.4-10.5/29.0-35.5 with most HGB in the mid 9 range.). She reported history of hematochezia and colonoscopy (2013 by [facility], but no result report noted). T&C ordered by surgeon.    Patient's H/H have dropped slightly since last month..She is still on iron supplements. I encouraged her to continue follow-up with her PCP (and will also route to Dr. Tiburcio Pea). I left a voice message with Taren at Dr. Randel Pigg office as well. Prepare RBC order is in. She did have evaluation for chest pain in May (with cardiology input) that was felt to be atypical and most consistent with musculoskeletal etiology. No further testing ordered following echo. She has had three prior orthopedic surgeries here (and many others in the last few years at [facility]), last ~ 8 months ago. Further evaluation by her anesthesiologist on the day of surgery.   George Hugh [facility] Short Stay Center/Anesthesiology Phone [phone] 05/19/2017 11:38 AM

## 2017-05-22 ENCOUNTER — Telehealth (INDEPENDENT_AMBULATORY_CARE_PROVIDER_SITE_OTHER): Payer: Self-pay

## 2017-05-22 ENCOUNTER — Other Ambulatory Visit: Payer: Self-pay | Admitting: Internal Medicine

## 2017-05-22 MED ORDER — DEXTROSE 5 % IV SOLN
3.0000 g | INTRAVENOUS | Status: DC
  Filled 2017-05-22: qty 3000

## 2017-05-22 NOTE — Telephone Encounter (Signed)
Dr Marlou Sa decided to cx surgery due to patients sx.

## 2017-05-22 NOTE — Telephone Encounter (Signed)
Patient called stating that she is having a  bad flare-up with her Lupus and is scheduled to have surgery in the morning.  Patient would like a call back.  CB# is [phone].  Please advise.  Thank You.

## 2017-05-23 ENCOUNTER — Inpatient Hospital Stay (HOSPITAL_COMMUNITY): Admission: RE | Admit: 2017-05-23 | Payer: Medicare HMO | Source: Ambulatory Visit | Admitting: Orthopedic Surgery

## 2017-05-23 ENCOUNTER — Encounter (HOSPITAL_COMMUNITY): Admission: RE | Payer: Self-pay | Source: Ambulatory Visit

## 2017-05-23 SURGERY — OPEN REDUCTION INTERNAL FIXATION (ORIF) DISTAL FEMUR FRACTURE
Anesthesia: General | Site: Leg Upper | Laterality: Right

## 2017-05-23 NOTE — Telephone Encounter (Signed)
Dr. Tiburcio Pea is back as of yesterday.  I will let him fill this one.    Thanks!

## 2017-05-30 LAB — TYPE AND SCREEN
ABO/RH(D): A POS
Antibody Screen: POSITIVE
DAT, IGG: NEGATIVE
DONOR AG TYPE: NEGATIVE
Donor AG Type: NEGATIVE
PT AG Type: NEGATIVE
UNIT DIVISION: 0
UNIT DIVISION: 0

## 2017-05-30 LAB — BPAM RBC
BLOOD PRODUCT EXPIRATION DATE: 201808092359
Blood Product Expiration Date: 201808092359
UNIT TYPE AND RH: 6200
Unit Type and Rh: 6200

## 2017-06-05 ENCOUNTER — Inpatient Hospital Stay (INDEPENDENT_AMBULATORY_CARE_PROVIDER_SITE_OTHER): Payer: Medicare HMO | Admitting: Orthopedic Surgery

## 2017-06-07 ENCOUNTER — Other Ambulatory Visit: Payer: Self-pay | Admitting: Internal Medicine

## 2017-06-07 DIAGNOSIS — G894 Chronic pain syndrome: Secondary | ICD-10-CM | POA: Diagnosis not present

## 2017-06-07 DIAGNOSIS — M79604 Pain in right leg: Secondary | ICD-10-CM | POA: Diagnosis not present

## 2017-06-07 DIAGNOSIS — Z79891 Long term (current) use of opiate analgesic: Secondary | ICD-10-CM | POA: Diagnosis not present

## 2017-06-19 ENCOUNTER — Encounter: Payer: Medicare HMO | Admitting: Internal Medicine

## 2017-06-20 ENCOUNTER — Ambulatory Visit (INDEPENDENT_AMBULATORY_CARE_PROVIDER_SITE_OTHER): Payer: Medicare HMO | Admitting: Internal Medicine

## 2017-06-20 ENCOUNTER — Ambulatory Visit (HOSPITAL_COMMUNITY)
Admission: RE | Admit: 2017-06-20 | Discharge: 2017-06-20 | Disposition: A | Discharge status: Home / Self Care | Payer: Medicare HMO | Source: Ambulatory Visit | Attending: Internal Medicine | Admitting: Internal Medicine

## 2017-06-20 ENCOUNTER — Encounter: Payer: Self-pay | Admitting: Internal Medicine

## 2017-06-20 VITALS — BP 131/53 | HR 89 | Temp 97.7°F | Ht 60.0 in | Wt 285.2 lb

## 2017-06-20 DIAGNOSIS — I10 Essential (primary) hypertension: Secondary | ICD-10-CM

## 2017-06-20 DIAGNOSIS — R319 Hematuria, unspecified: Secondary | ICD-10-CM | POA: Insufficient documentation

## 2017-06-20 DIAGNOSIS — R34 Anuria and oliguria: Secondary | ICD-10-CM | POA: Insufficient documentation

## 2017-06-20 DIAGNOSIS — G5601 Carpal tunnel syndrome, right upper limb: Secondary | ICD-10-CM

## 2017-06-20 LAB — BASIC METABOLIC PANEL
Anion gap: 7 (ref 5–15)
BUN: 8 mg/dL (ref 6–20)
CALCIUM: 8.7 mg/dL — AB (ref 8.9–10.3)
CHLORIDE: 106 mmol/L (ref 101–111)
CO2: 26 mmol/L (ref 22–32)
CREATININE: 0.88 mg/dL (ref 0.44–1.00)
GFR calc Af Amer: >60 mL/min (ref >60)
Glucose, Bld: 112 mg/dL — ABNORMAL HIGH (ref 65–99)
Potassium: 3.8 mmol/L (ref 3.5–5.1)
SODIUM: 139 mmol/L (ref 135–145)

## 2017-06-20 LAB — URINALYSIS, ROUTINE W REFLEX MICROSCOPIC
Bilirubin Urine: NEGATIVE
GLUCOSE, UA: NEGATIVE mg/dL
Ketones, ur: NEGATIVE mg/dL
Leukocytes, UA: NEGATIVE
Nitrite: NEGATIVE
PROTEIN: NEGATIVE mg/dL
SPECIFIC GRAVITY, URINE: 1.017 (ref 1.005–1.030)
pH: 5 (ref 5.0–8.0)

## 2017-06-20 LAB — POCT URINALYSIS DIPSTICK
BILIRUBIN UA: NEGATIVE
Glucose, UA: NEGATIVE
Ketones, UA: NEGATIVE
LEUKOCYTES UA: NEGATIVE
NITRITE UA: NEGATIVE
PH UA: 6 (ref 5.0–8.0)
PROTEIN UA: NEGATIVE
Spec Grav, UA: 1.025 (ref 1.010–1.025)
Urobilinogen, UA: 0.2 E.U./dL

## 2017-06-20 MED ORDER — DICLOFENAC SODIUM 1 % TD GEL: 4.0000 g | Freq: Three times a day (TID) | TRANSDERMAL | 0 refills | Status: DC

## 2017-06-20 MED ORDER — FUROSEMIDE 40 MG PO TABS: 40.0000 mg | ORAL_TABLET | Freq: Two times a day (BID) | ORAL | 0 refills | Status: DC

## 2017-06-20 NOTE — Progress Notes (Signed)
Internal Medicine Clinic Attending  I saw and evaluated the patient.  I personally confirmed the key portions of the history and exam documented by Dr. Amin and I reviewed pertinent patient test results.  The assessment, diagnosis, and plan were formulated together and I agree with the documentation in the resident's note. 

## 2017-06-20 NOTE — Assessment & Plan Note (Signed)
BP Readings from Last 3 Encounters:  06/20/17 (!) 131/53  05/18/17 117/76  03/31/17 130/79   She was having mildly elevated systolic blood pressure.  Her worsening edema with oliguria might be responsible.  -Continue current management with amlodipine and hydrochlorothiazide.

## 2017-06-20 NOTE — Assessment & Plan Note (Signed)
Her symptoms and exam was more consistent with carpal tunnel syndrome. Most likely because of excessive typing.  She was told to use a wrist splint especially at night and keep her wrist at a neutral position. She was also given a prescription for Voltaren gel.  If these conservative measures failed-we can try steroid injection.
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
## 2017-06-20 NOTE — Assessment & Plan Note (Signed)
Her symptoms of oliguria and inability to urinate for couple of days, we did a bladder scan, only found little above 100 mL. She has gained about 16 pounds in the past one month with worsening lower extremity edema. Her renal functions were normal and urine exam shows hematuria but no protein urea. We did  renal ultrasound to rule out any obstruction which was normal.  Because of her history of lupus she can be developing acute membranous glomerulonephritis even with normal renal function at this time.We did asked for urine spinning to see cast-results are pending. I talked with Dr. Jimmy Footman from nephrology,he advised to send her home with Lasix 40 mg twice daily and she should be seen by his practice with than 1-2 days.  Appointment was made with [facility] for this coming Thursday, 06/22/2017 at 8:15 AM.  Patient was sent home on Lasix 40 mg twice daily and instructions to go for her appointment with nephrology. She was instructed to seek medical attention if she develops excessive drowsiness, dizziness or shortness of breath.

## 2017-06-20 NOTE — Patient Instructions (Addendum)
Thank you for visiting clinic today. I make an appointment for you to be seen at [facility] by a kidney doctor. Please be there by 8:15 on Thursday, 06/22/2017. I'm also giving you a water pill called Lasix 40 mg, you will take it twice daily one in the morning and one in early afternoon. For your wrist you can use a wrist splint, and Voltaren gel. Avoid working requiring excessive use of your wrist. Please seek immediate medical attention if you become dizzy, drowsy or become short of breath.

## 2017-06-20 NOTE — Addendum Note (Signed)
Addended by: Truddie Crumble on: 06/20/2017 04:55 PM   Modules accepted: Orders

## 2017-06-27 DIAGNOSIS — N289 Disorder of kidney and ureter, unspecified: Secondary | ICD-10-CM | POA: Diagnosis not present

## 2017-06-27 DIAGNOSIS — M86659 Other chronic osteomyelitis, unspecified thigh: Secondary | ICD-10-CM | POA: Diagnosis not present

## 2017-06-27 DIAGNOSIS — Z6841 Body Mass Index (BMI) 40.0 and over, adult: Secondary | ICD-10-CM | POA: Diagnosis not present

## 2017-06-27 DIAGNOSIS — R319 Hematuria, unspecified: Secondary | ICD-10-CM | POA: Diagnosis not present

## 2017-07-17 DIAGNOSIS — M79604 Pain in right leg: Secondary | ICD-10-CM | POA: Diagnosis not present

## 2017-07-17 DIAGNOSIS — Z79891 Long term (current) use of opiate analgesic: Secondary | ICD-10-CM | POA: Diagnosis not present

## 2017-07-17 DIAGNOSIS — G894 Chronic pain syndrome: Secondary | ICD-10-CM | POA: Diagnosis not present

## 2017-07-19 ENCOUNTER — Ambulatory Visit (INDEPENDENT_AMBULATORY_CARE_PROVIDER_SITE_OTHER): Payer: Medicare HMO | Admitting: Orthopedic Surgery

## 2017-07-20 ENCOUNTER — Ambulatory Visit (INDEPENDENT_AMBULATORY_CARE_PROVIDER_SITE_OTHER): Payer: Medicare HMO | Admitting: Orthopedic Surgery

## 2017-07-20 ENCOUNTER — Ambulatory Visit (INDEPENDENT_AMBULATORY_CARE_PROVIDER_SITE_OTHER): Payer: Medicare HMO

## 2017-07-20 ENCOUNTER — Encounter (INDEPENDENT_AMBULATORY_CARE_PROVIDER_SITE_OTHER): Payer: Self-pay | Admitting: Orthopedic Surgery

## 2017-07-20 DIAGNOSIS — M25532 Pain in left wrist: Secondary | ICD-10-CM

## 2017-07-20 DIAGNOSIS — M869 Osteomyelitis, unspecified: Secondary | ICD-10-CM
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
## 2017-07-21 ENCOUNTER — Other Ambulatory Visit: Payer: Self-pay | Admitting: Internal Medicine

## 2017-08-17 ENCOUNTER — Ambulatory Visit (INDEPENDENT_AMBULATORY_CARE_PROVIDER_SITE_OTHER): Payer: Medicare HMO | Admitting: Internal Medicine

## 2017-08-17 ENCOUNTER — Encounter: Payer: Self-pay | Admitting: Internal Medicine

## 2017-08-17 VITALS — BP 142/85 | HR 84 | Temp 98.0°F

## 2017-08-17 DIAGNOSIS — L299 Pruritus, unspecified: Secondary | ICD-10-CM | POA: Diagnosis not present

## 2017-08-17 DIAGNOSIS — M869 Osteomyelitis, unspecified: Secondary | ICD-10-CM

## 2017-08-17 DIAGNOSIS — M8668 Other chronic osteomyelitis, other site: Secondary | ICD-10-CM | POA: Diagnosis not present

## 2017-08-17 DIAGNOSIS — A491 Streptococcal infection, unspecified site: Secondary | ICD-10-CM

## 2017-08-17 MED ORDER — AMOXICILLIN 500 MG PO TABS
500.0000 mg | ORAL_TABLET | Freq: Three times a day (TID) | ORAL | 11 refills | Status: DC
Start: 2017-08-17 — End: 2017-12-22

## 2017-08-17 NOTE — Assessment & Plan Note (Signed)
I will put her on suppressive antibiotics with amoxicillin and follow up in 4 months.

## 2017-08-17 NOTE — Assessment & Plan Note (Addendum)
I encouraged her to consider definitive therapy per surgical recommendations and ok to get a second opinion.

## 2017-08-17 NOTE — Progress Notes (Signed)
   Subjective:    Patient ID: Katherine Banks, female    DOB: 1966/08/03, 51 y.o.   MRN: [id number]  HPI Here for follow up of her chronic hip osteomyelitis at the request of Dr. Marlou Sa.   When I saw her in 2017: Katherine Banks is a 51 y.o. female with history of lupus and chronic steroid treatment, history of right THA complicated by infection, resection arthroplasty and girdelstone procedure who noted fatigue and more difficulty walking for about 2-3 weeks. Had not been able to put pressure on leg, which she is normally able to walk on, and increased pain. Some n/v the day before admission. MRI noted a fluid collection and pus in the OR I and Ded.  Grew MRSA.  Completed 6 weeks of IV vancomycin via her port and placed on doxycycline 100 mg bid for 6 months, likely followed by suppressive doxy (once a day) after that.   She was last seen by my partner Dr. Megan Salon in 2017.  She had a culture from aspiration that grew group B Streptococcus and I and D in November 2017 again grew GBS and at that time was placed on IV ceftriaxone.  She had been on chronic suppressive antibiotics by me - first doxycycline with her history of MRSA and then Keflex with aspiration as above with GBS.  When she was last seen she was 3 weeks into a 6 week course but did not return for follow up.  She has been off of antibiotics for months. She has been followed by Dr. Marlou Sa regarding her hip and recent wrist injury as well.  He had planned to do definitive surgery but she canceled the surgical appointment x 2 and now is seeking a second opinion.  He infection is known in the proximal femur based on her last CT in May 2018.  She does feel she gets flares of her chronic infection with heat blisters over the area and warmth, though not at this time.  No fever, some occasional chills.     Review of Systems  Gastrointestinal: Negative for diarrhea and nausea.  Skin: Negative for rash.       Objective:   Physical Exam    Constitutional: She appears well-developed and well-nourished. No distress.  In wheelchair  Eyes: No scleral icterus.  Cardiovascular: Normal rate, regular rhythm and normal heart sounds.   No murmur heard. Pulmonary/Chest: Effort normal and breath sounds normal. No respiratory distress.  Skin: No rash noted.   SH: remote smoker       Assessment & Plan:

## 2017-08-17 NOTE — Assessment & Plan Note (Signed)
Has now.  May be a opioid medication effect.

## 2017-08-22 DIAGNOSIS — M79604 Pain in right leg: Secondary | ICD-10-CM | POA: Diagnosis not present

## 2017-08-22 DIAGNOSIS — M25532 Pain in left wrist: Secondary | ICD-10-CM | POA: Diagnosis not present

## 2017-08-22 DIAGNOSIS — G894 Chronic pain syndrome: Secondary | ICD-10-CM | POA: Diagnosis not present

## 2017-08-22 DIAGNOSIS — Z79891 Long term (current) use of opiate analgesic: Secondary | ICD-10-CM | POA: Diagnosis not present

## 2017-08-25 ENCOUNTER — Telehealth: Payer: Self-pay

## 2017-08-25 NOTE — Telephone Encounter (Signed)
Per patient home health nurse will not go to patient home today to flush out porter. Needs to know what to, please call pt back.

## 2017-08-25 NOTE — Telephone Encounter (Signed)
This was addressed by the md office that ordered this today per pt, she did want to make a pcp appt, called [facility] and got appt

## 2017-09-04 ENCOUNTER — Other Ambulatory Visit: Payer: Self-pay | Admitting: Internal Medicine

## 2017-09-06 ENCOUNTER — Encounter: Payer: Medicare HMO | Admitting: Internal Medicine

## 2017-09-13 ENCOUNTER — Encounter: Payer: Medicare HMO | Admitting: Internal Medicine

## 2017-09-14 ENCOUNTER — Ambulatory Visit (INDEPENDENT_AMBULATORY_CARE_PROVIDER_SITE_OTHER): Payer: Medicare HMO | Admitting: Internal Medicine

## 2017-09-14 ENCOUNTER — Encounter: Payer: Self-pay | Admitting: Internal Medicine

## 2017-09-14 DIAGNOSIS — B029 Zoster without complications: Secondary | ICD-10-CM | POA: Diagnosis not present

## 2017-09-14 MED ORDER — CAPSAICIN 0.025 % EX CREA
TOPICAL_CREAM | Freq: Two times a day (BID) | CUTANEOUS | 0 refills | Status: DC
Start: 2017-09-14 — End: 2017-12-22

## 2017-09-14 MED ORDER — VALACYCLOVIR HCL 1 G PO TABS: 1000.0000 mg | ORAL_TABLET | Freq: Two times a day (BID) | ORAL | 0 refills | Status: DC

## 2017-09-14 NOTE — Patient Instructions (Signed)
Ms. Highland,  Your rash is coming from shingles. I have sent you in an antiviral therapy to help treat it and a cream to use to try to help with the pain.    Shingles Shingles is an infection that causes a painful skin rash and fluid-filled blisters. Shingles is caused by the same virus that causes chickenpox. Shingles only develops in people who:  Have had chickenpox.  Have gotten the chickenpox vaccine. (This is rare.)  The first symptoms of shingles may be itching, tingling, or pain in an area on your skin. A rash will follow in a few days or weeks. The rash is usually on one side of the body in a bandlike or beltlike pattern. Over time, the rash turns into fluid-filled blisters that break open, scab over, and dry up. Medicines may:  Help you manage pain.  Help you recover more quickly.  Help to prevent long-term problems.  Follow these instructions at home: Medicines  Take medicines only as told by your doctor.  Apply an anti-itch or numbing cream to the affected area as told by your doctor. Blister and Rash Care  Take a cool bath or put cool compresses on the area of the rash or blisters as told by your doctor. This may help with pain and itching.  Keep your rash covered with a loose bandage (dressing). Wear loose-fitting clothing.  Keep your rash and blisters clean with mild soap and cool water or as told by your doctor.  Check your rash every day for signs of infection. These include redness, swelling, and pain that lasts or gets worse.  Do not pick your blisters.  Do not scratch your rash. General instructions  Rest as told by your doctor.  Keep all follow-up visits as told by your doctor. This is important.  Until your blisters scab over, your infection can cause chickenpox in people who have never had it or been vaccinated against it. To prevent this from happening, avoid touching other people or being around other people, especially: ? Babies. ? Pregnant  women. ? Children who have eczema. ? Elderly people who have transplants. ? People who have chronic illnesses, such as leukemia or AIDS. Contact a doctor if:  Your pain does not get better with medicine.  Your pain does not get better after the rash heals.  Your rash looks infected. Signs of infection include: ? Redness. ? Swelling. ? Pain that lasts or gets worse. Get help right away if:  The rash is on your face or nose.  You have pain in your face, pain around your eye area, or loss of feeling on one side of your face.  You have ear pain or you have ringing in your ear.  You have loss of taste.  Your condition gets worse. This information is not intended to replace advice given to you by your health care provider. Make sure you discuss any questions you have with your health care provider. Document Released: 04/04/2008 Document Revised: 06/12/2016 Document Reviewed: 07/29/2014 Elsevier Interactive Patient Education  [employer].
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
## 2017-09-18 DIAGNOSIS — B029 Zoster without complications: Secondary | ICD-10-CM

## 2017-09-18 NOTE — Assessment & Plan Note (Signed)
Patient with shingles over right flank. Discussed with ID over the phone given her chronic co-morbid conditions with immune suppression and chronic osteo. Given patient has no systemic symptoms or encephalitis there is no need for IV anti-virals. Will give course of valacyclovir and topical analgesic. Discussed contact precautions with the patient.

## 2017-09-25 NOTE — Progress Notes (Signed)
Internal Medicine Clinic Attending  Case discussed with Dr. Boswell  at the time of the visit.  We reviewed the resident's history and exam and pertinent patient test results.  I agree with the assessment, diagnosis, and plan of care documented in the resident's note.  Alexander N Raines, MD   

## 2017-09-27 ENCOUNTER — Encounter: Payer: Medicare HMO | Admitting: Internal Medicine

## 2017-09-28 ENCOUNTER — Other Ambulatory Visit: Payer: Self-pay | Admitting: Internal Medicine

## 2017-09-28 DIAGNOSIS — M329 Systemic lupus erythematosus, unspecified: Secondary | ICD-10-CM

## 2017-09-28 NOTE — Telephone Encounter (Signed)
Next appt scheduled  12/11/17 with PCP. 

## 2017-09-29 ENCOUNTER — Other Ambulatory Visit: Payer: Self-pay | Admitting: *Deleted

## 2017-09-30 ENCOUNTER — Other Ambulatory Visit: Payer: Self-pay | Admitting: Internal Medicine

## 2017-10-02 MED ORDER — DULOXETINE HCL 30 MG PO CPEP: ORAL_CAPSULE | ORAL | 0 refills | Status: DC

## 2017-10-02 NOTE — Telephone Encounter (Signed)
Pt needs to keep her appt in Feb The use of plaquenil needs to be discussed- and the fact that pt will need periodic eye exams by an ophthalmologist because she is on plaquenil - giving 3 month supply

## 2017-10-10 ENCOUNTER — Other Ambulatory Visit (INDEPENDENT_AMBULATORY_CARE_PROVIDER_SITE_OTHER): Payer: Self-pay | Admitting: Orthopedic Surgery

## 2017-10-10 NOTE — Telephone Encounter (Signed)
Ok to rf or should this come from her PCP?

## 2017-10-10 NOTE — Telephone Encounter (Signed)
pcp

## 2017-10-13 DIAGNOSIS — G894 Chronic pain syndrome: Secondary | ICD-10-CM | POA: Diagnosis not present

## 2017-10-13 DIAGNOSIS — M25532 Pain in left wrist: Secondary | ICD-10-CM | POA: Diagnosis not present

## 2017-10-13 DIAGNOSIS — B020 Zoster encephalitis: Secondary | ICD-10-CM | POA: Diagnosis not present

## 2017-10-13 DIAGNOSIS — M79604 Pain in right leg: Secondary | ICD-10-CM | POA: Diagnosis not present

## 2017-10-13 DIAGNOSIS — Z79891 Long term (current) use of opiate analgesic: Secondary | ICD-10-CM | POA: Diagnosis not present

## 2017-10-25 ENCOUNTER — Other Ambulatory Visit: Payer: Self-pay | Admitting: Internal Medicine

## 2017-10-26 NOTE — Telephone Encounter (Signed)
Next appt scheduled  12/11/17 with PCP. 

## 2017-11-13 DIAGNOSIS — M25532 Pain in left wrist: Secondary | ICD-10-CM | POA: Diagnosis not present

## 2017-11-13 DIAGNOSIS — G894 Chronic pain syndrome: Secondary | ICD-10-CM | POA: Diagnosis not present

## 2017-11-13 DIAGNOSIS — M79604 Pain in right leg: Secondary | ICD-10-CM | POA: Diagnosis not present

## 2017-11-13 DIAGNOSIS — Z79891 Long term (current) use of opiate analgesic: Secondary | ICD-10-CM | POA: Diagnosis not present

## 2017-12-11 ENCOUNTER — Ambulatory Visit (INDEPENDENT_AMBULATORY_CARE_PROVIDER_SITE_OTHER): Payer: Medicare HMO | Admitting: Internal Medicine

## 2017-12-11 ENCOUNTER — Encounter (INDEPENDENT_AMBULATORY_CARE_PROVIDER_SITE_OTHER): Payer: Self-pay

## 2017-12-11 DIAGNOSIS — Z79891 Long term (current) use of opiate analgesic: Secondary | ICD-10-CM

## 2017-12-11 DIAGNOSIS — B0229 Other postherpetic nervous system involvement: Secondary | ICD-10-CM | POA: Diagnosis not present

## 2017-12-11 DIAGNOSIS — B029 Zoster without complications: Secondary | ICD-10-CM | POA: Diagnosis not present

## 2017-12-11 DIAGNOSIS — R69 Illness, unspecified: Secondary | ICD-10-CM | POA: Diagnosis not present

## 2017-12-11 MED ORDER — DICYCLOMINE HCL 20 MG PO TABS: 20.0000 mg | ORAL_TABLET | Freq: Three times a day (TID) | ORAL | 1 refills | Status: DC

## 2017-12-11 MED ORDER — MONTELUKAST SODIUM 10 MG PO TABS: 10.0000 mg | ORAL_TABLET | Freq: Every day | ORAL | 2 refills | Status: DC

## 2017-12-11 MED ORDER — ALBUTEROL SULFATE (2.5 MG/3ML) 0.083% IN NEBU: 2.5000 mg | INHALATION_SOLUTION | Freq: Three times a day (TID) | RESPIRATORY_TRACT | 1 refills | Status: AC | PRN

## 2017-12-11 MED ORDER — OMEPRAZOLE 20 MG PO CPDR: DELAYED_RELEASE_CAPSULE | ORAL | 3 refills | Status: DC

## 2017-12-11 MED ORDER — PREGABALIN 75 MG PO CAPS: 75.0000 mg | ORAL_CAPSULE | Freq: Two times a day (BID) | ORAL | 1 refills | Status: DC

## 2017-12-11 MED ORDER — FLUTICASONE PROPIONATE 50 MCG/ACT NA SUSP: 1.0000 | Freq: Every day | NASAL | 6 refills | Status: AC
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
## 2017-12-11 NOTE — Patient Instructions (Signed)
FOLLOW-UP INSTRUCTIONS When: next available appointment with Dr Maricela Bo For: Lupus and shingles pain What to bring: current medications  Please stop taking gabapentin and start taking Lyrica to see if this helps with your shingles pain.    We will start at 75 mg twice per day but can increase this if needed

## 2017-12-11 NOTE — Assessment & Plan Note (Signed)
Assessment: Patient treated for shingles in Nov 2018.  Rash has healed and currently has no vesicular lesions on exam.  She complains of intense post-herpetic neuralgia pain today unrelieved with Capsaicin cream, gabapentin, and cymbalta.  She is also on high dose narcotiics through pain management.  Plan: - DC gabapentin - Start Lyrica 75 mg BID with room to titrate up - Continue Capsaicin cream as needed - Consider TCA if pain continues to be uncontrolled on current regimen - Continue opiates per her pain management doctor.

## 2017-12-12 NOTE — Progress Notes (Signed)
Internal Medicine Clinic Attending  Case discussed with Dr. Wallace at the time of the visit.  We reviewed the resident's history and exam and pertinent patient test results.  I agree with the assessment, diagnosis, and plan of care documented in the resident's note.  

## 2017-12-18 DIAGNOSIS — Z79891 Long term (current) use of opiate analgesic: Secondary | ICD-10-CM | POA: Diagnosis not present

## 2017-12-18 DIAGNOSIS — M25532 Pain in left wrist: Secondary | ICD-10-CM | POA: Diagnosis not present

## 2017-12-18 DIAGNOSIS — G894 Chronic pain syndrome: Secondary | ICD-10-CM | POA: Diagnosis not present

## 2017-12-18 DIAGNOSIS — M79604 Pain in right leg: Secondary | ICD-10-CM | POA: Diagnosis not present

## 2017-12-19 ENCOUNTER — Ambulatory Visit: Payer: Medicare HMO | Admitting: Internal Medicine

## 2017-12-19 ENCOUNTER — Other Ambulatory Visit: Payer: Self-pay | Admitting: *Deleted

## 2017-12-19 MED ORDER — DULOXETINE HCL 30 MG PO CPEP: ORAL_CAPSULE | ORAL | 0 refills | Status: DC

## 2017-12-19 NOTE — Telephone Encounter (Signed)
Last 2 appts were acute. Needs PCP appt - first available for HTN and other chronic med issues.

## 2017-12-21 ENCOUNTER — Encounter (HOSPITAL_COMMUNITY): Payer: Self-pay | Admitting: Emergency Medicine

## 2017-12-21 ENCOUNTER — Other Ambulatory Visit: Payer: Self-pay

## 2017-12-21 ENCOUNTER — Emergency Department (HOSPITAL_COMMUNITY): Payer: Medicare HMO

## 2017-12-21 DIAGNOSIS — Y939 Activity, unspecified: Secondary | ICD-10-CM | POA: Diagnosis not present

## 2017-12-21 DIAGNOSIS — Z86718 Personal history of other venous thrombosis and embolism: Secondary | ICD-10-CM | POA: Diagnosis not present

## 2017-12-21 DIAGNOSIS — I10 Essential (primary) hypertension: Secondary | ICD-10-CM | POA: Diagnosis not present

## 2017-12-21 DIAGNOSIS — W19XXXA Unspecified fall, initial encounter: Secondary | ICD-10-CM | POA: Insufficient documentation

## 2017-12-21 DIAGNOSIS — Z79899 Other long term (current) drug therapy: Secondary | ICD-10-CM | POA: Diagnosis not present

## 2017-12-21 DIAGNOSIS — S82832A Other fracture of upper and lower end of left fibula, initial encounter for closed fracture: Secondary | ICD-10-CM | POA: Diagnosis not present

## 2017-12-21 DIAGNOSIS — S99912A Unspecified injury of left ankle, initial encounter: Secondary | ICD-10-CM | POA: Diagnosis not present

## 2017-12-21 DIAGNOSIS — Z7982 Long term (current) use of aspirin: Secondary | ICD-10-CM | POA: Insufficient documentation

## 2017-12-21 DIAGNOSIS — M25572 Pain in left ankle and joints of left foot: Secondary | ICD-10-CM | POA: Diagnosis not present

## 2017-12-21 DIAGNOSIS — S0990XA Unspecified injury of head, initial encounter: Secondary | ICD-10-CM | POA: Diagnosis not present

## 2017-12-21 DIAGNOSIS — M542 Cervicalgia: Secondary | ICD-10-CM | POA: Diagnosis not present

## 2017-12-21 DIAGNOSIS — Y998 Other external cause status: Secondary | ICD-10-CM | POA: Insufficient documentation

## 2017-12-21 DIAGNOSIS — J45909 Unspecified asthma, uncomplicated: Secondary | ICD-10-CM | POA: Insufficient documentation

## 2017-12-21 DIAGNOSIS — Z87891 Personal history of nicotine dependence: Secondary | ICD-10-CM | POA: Insufficient documentation

## 2017-12-21 DIAGNOSIS — G4489 Other headache syndrome: Secondary | ICD-10-CM | POA: Diagnosis not present

## 2017-12-21 DIAGNOSIS — Y92009 Unspecified place in unspecified non-institutional (private) residence as the place of occurrence of the external cause: Secondary | ICD-10-CM | POA: Insufficient documentation

## 2017-12-21 DIAGNOSIS — S199XXA Unspecified injury of neck, initial encounter: Secondary | ICD-10-CM | POA: Diagnosis not present

## 2017-12-21 DIAGNOSIS — S8992XA Unspecified injury of left lower leg, initial encounter: Secondary | ICD-10-CM | POA: Diagnosis present

## 2017-12-21 DIAGNOSIS — R51 Headache: Secondary | ICD-10-CM | POA: Diagnosis not present

## 2017-12-21 DIAGNOSIS — S098XXA Other specified injuries of head, initial encounter: Secondary | ICD-10-CM | POA: Diagnosis not present

## 2017-12-21 NOTE — ED Triage Notes (Signed)
Patient is from home, fell at home while walking with her walker.  She hit her head on the floor, no LOC, full recall.  She is having lower back pain and left ankle pain.  She took a 2mg  dilaudid after the fall, around 330pm.

## 2017-12-22 ENCOUNTER — Emergency Department (HOSPITAL_COMMUNITY)
Admission: EM | Admit: 2017-12-22 | Discharge: 2017-12-22 | Disposition: A | Discharge status: Home / Self Care | Payer: Medicare HMO | Attending: Emergency Medicine | Admitting: Emergency Medicine

## 2017-12-22 ENCOUNTER — Emergency Department (HOSPITAL_COMMUNITY): Payer: Medicare HMO

## 2017-12-22 DIAGNOSIS — S0990XA Unspecified injury of head, initial encounter: Secondary | ICD-10-CM | POA: Diagnosis not present

## 2017-12-22 DIAGNOSIS — M542 Cervicalgia: Secondary | ICD-10-CM | POA: Diagnosis not present

## 2017-12-22 DIAGNOSIS — J45909 Unspecified asthma, uncomplicated: Secondary | ICD-10-CM | POA: Diagnosis not present

## 2017-12-22 DIAGNOSIS — S92909A Unspecified fracture of unspecified foot, initial encounter for closed fracture: Secondary | ICD-10-CM | POA: Diagnosis not present

## 2017-12-22 DIAGNOSIS — Z87891 Personal history of nicotine dependence: Secondary | ICD-10-CM | POA: Diagnosis not present

## 2017-12-22 DIAGNOSIS — Y92009 Unspecified place in unspecified non-institutional (private) residence as the place of occurrence of the external cause: Secondary | ICD-10-CM

## 2017-12-22 DIAGNOSIS — Z7982 Long term (current) use of aspirin: Secondary | ICD-10-CM | POA: Diagnosis not present

## 2017-12-22 DIAGNOSIS — S82832A Other fracture of upper and lower end of left fibula, initial encounter for closed fracture: Secondary | ICD-10-CM

## 2017-12-22 DIAGNOSIS — Z86718 Personal history of other venous thrombosis and embolism: Secondary | ICD-10-CM | POA: Diagnosis not present

## 2017-12-22 DIAGNOSIS — I10 Essential (primary) hypertension: Secondary | ICD-10-CM | POA: Diagnosis not present

## 2017-12-22 DIAGNOSIS — G8911 Acute pain due to trauma: Secondary | ICD-10-CM | POA: Diagnosis not present

## 2017-12-22 DIAGNOSIS — S199XXA Unspecified injury of neck, initial encounter: Secondary | ICD-10-CM | POA: Diagnosis not present

## 2017-12-22 DIAGNOSIS — Z79899 Other long term (current) drug therapy: Secondary | ICD-10-CM | POA: Diagnosis not present

## 2017-12-22 DIAGNOSIS — W19XXXA Unspecified fall, initial encounter: Secondary | ICD-10-CM | POA: Diagnosis not present

## 2017-12-22 LAB — COMPREHENSIVE METABOLIC PANEL
ALK PHOS: 132 U/L — AB (ref 38–126)
ALT: 31 U/L (ref 14–54)
AST: 32 U/L (ref 15–41)
Albumin: 3.4 g/dL — ABNORMAL LOW (ref 3.5–5.0)
Anion gap: 10 (ref 5–15)
BUN: 11 mg/dL (ref 6–20)
CALCIUM: 8.9 mg/dL (ref 8.9–10.3)
CHLORIDE: 105 mmol/L (ref 101–111)
CO2: 25 mmol/L (ref 22–32)
CREATININE: 0.8 mg/dL (ref 0.44–1.00)
GFR calc Af Amer: >60 mL/min (ref >60)
Glucose, Bld: 136 mg/dL — ABNORMAL HIGH (ref 65–99)
Potassium: 3.9 mmol/L (ref 3.5–5.1)
SODIUM: 140 mmol/L (ref 135–145)
Total Bilirubin: 0.4 mg/dL (ref 0.3–1.2)
Total Protein: 7.1 g/dL (ref 6.5–8.1)

## 2017-12-22 LAB — CBC WITH DIFFERENTIAL/PLATELET
BASOS ABS: 0 10*3/uL (ref 0.0–0.1)
Basophils Relative: 0 %
EOS PCT: 2 %
Eosinophils Absolute: 0.2 10*3/uL (ref 0.0–0.7)
HCT: 34.9 % — ABNORMAL LOW (ref 36.0–46.0)
HEMOGLOBIN: 10.7 g/dL — AB (ref 12.0–15.0)
LYMPHS ABS: 4.6 10*3/uL — AB (ref 0.7–4.0)
LYMPHS PCT: 47 %
MCH: 23.7 pg — AB (ref 26.0–34.0)
MCHC: 30.7 g/dL (ref 30.0–36.0)
MCV: 77.2 fL — AB (ref 78.0–100.0)
Monocytes Absolute: 0.5 10*3/uL (ref 0.1–1.0)
Monocytes Relative: 6 %
NEUTROS PCT: 45 %
Neutro Abs: 4.4 10*3/uL (ref 1.7–7.7)
PLATELETS: 326 10*3/uL (ref 150–400)
RBC: 4.52 MIL/uL (ref 3.87–5.11)
RDW: 17.4 % — ABNORMAL HIGH (ref 11.5–15.5)
WBC: 9.7 10*3/uL (ref 4.0–10.5)

## 2017-12-22 MED ORDER — HYDROMORPHONE HCL 1 MG/ML IJ SOLN
0.5000 mg | Freq: Once | INTRAMUSCULAR | Status: AC
  Administered 2017-12-22: 0.5 mg via INTRAVENOUS
  Filled 2017-12-22: qty 1

## 2017-12-22 MED ORDER — SODIUM CHLORIDE 0.9% FLUSH: 10.0000 mL | Freq: Two times a day (BID) | INTRAVENOUS | Status: DC

## 2017-12-22 MED ORDER — HYDROMORPHONE HCL 1 MG/ML IJ SOLN
1.0000 mg | Freq: Once | INTRAMUSCULAR | Status: AC
  Administered 2017-12-22: 1 mg via INTRAVENOUS
  Filled 2017-12-22: qty 1

## 2017-12-22 MED ORDER — SODIUM CHLORIDE 0.9% FLUSH: 10.0000 mL | INTRAVENOUS | Status: DC | PRN

## 2017-12-22 MED ORDER — ONDANSETRON 4 MG PO TBDP
4.0000 mg | ORAL_TABLET | Freq: Once | ORAL | Status: AC
  Administered 2017-12-22: 4 mg via ORAL
  Filled 2017-12-22: qty 1

## 2017-12-22 MED ORDER — OXYCODONE HCL 5 MG PO TABS
10.0000 mg | ORAL_TABLET | Freq: Once | ORAL | Status: AC
  Administered 2017-12-22: 10 mg via ORAL
  Filled 2017-12-22: qty 2

## 2017-12-22 NOTE — ED Notes (Signed)
Ortho tech paged  

## 2017-12-22 NOTE — Discharge Instructions (Signed)
1. Medications: Home pain control, usual home medications 2. Treatment: rest, drink plenty of fluids, elevate foot 3. Follow Up: Please followup with Dr. Ninfa Linden for discussion of your diagnoses and further evaluation after today's visit; if you do not have a primary care doctor use the resource guide provided to find one; Please return to the ER for new or worsening symptoms

## 2017-12-22 NOTE — ED Notes (Signed)
IV team at bedside. Ortho tech at bedside.

## 2017-12-22 NOTE — ED Notes (Signed)
Power port deaccessed 
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
## 2017-12-22 NOTE — ED Notes (Signed)
Magda Paganini to call [facility].

## 2017-12-22 NOTE — ED Notes (Signed)
Ortho tech responded. 

## 2017-12-22 NOTE — Care Management Note (Signed)
Case Management Note  CM consulted for DME needs over night.  On chart review pt is to be non-weight bearing and already has a wheelchair at home.  No other information provided to CM about needs.  No further needs noted at this time.

## 2017-12-28 ENCOUNTER — Telehealth (INDEPENDENT_AMBULATORY_CARE_PROVIDER_SITE_OTHER): Payer: Self-pay | Admitting: Orthopedic Surgery

## 2017-12-28 ENCOUNTER — Ambulatory Visit (INDEPENDENT_AMBULATORY_CARE_PROVIDER_SITE_OTHER): Payer: Medicare HMO | Admitting: Orthopedic Surgery

## 2017-12-28 NOTE — Telephone Encounter (Signed)
Pt had to cancel appt due to transportation. Pt would like to come in earlier than march 11th. Ill call pt back and get her sched if you guys need me to.

## 2017-12-28 NOTE — Telephone Encounter (Signed)
IC no answer. LMVM advising did not have an appt sooner than 03/11. I LM for her to call me back to let me know what she was needing to be seen for so we could try to triage it sooner if needed

## 2017-12-29 ENCOUNTER — Other Ambulatory Visit: Payer: Self-pay | Admitting: Internal Medicine

## 2018-01-01 ENCOUNTER — Other Ambulatory Visit: Payer: Self-pay | Admitting: *Deleted

## 2018-01-01 ENCOUNTER — Telehealth: Payer: Self-pay | Admitting: *Deleted

## 2018-01-01 DIAGNOSIS — M329 Systemic lupus erythematosus, unspecified: Secondary | ICD-10-CM

## 2018-01-01 MED ORDER — HYDROXYCHLOROQUINE SULFATE 200 MG PO TABS: 200.0000 mg | ORAL_TABLET | Freq: Two times a day (BID) | ORAL | 0 refills | Status: DC

## 2018-01-01 NOTE — Telephone Encounter (Signed)
Call to CoverMyMeds for PA for Diclofenac Gel 1%.  Approved 10/29/2017 thru 121/31/2019.  Sander Nephew, RN 2:26 PM.

## 2018-01-01 NOTE — Telephone Encounter (Signed)
Next appt scheduled 3/22 with PCP. 

## 2018-01-09 ENCOUNTER — Ambulatory Visit (INDEPENDENT_AMBULATORY_CARE_PROVIDER_SITE_OTHER): Payer: Medicare HMO | Admitting: Orthopedic Surgery

## 2018-01-15 DIAGNOSIS — G894 Chronic pain syndrome: Secondary | ICD-10-CM | POA: Diagnosis not present

## 2018-01-15 DIAGNOSIS — M79604 Pain in right leg: Secondary | ICD-10-CM | POA: Diagnosis not present

## 2018-01-15 DIAGNOSIS — Z79891 Long term (current) use of opiate analgesic: Secondary | ICD-10-CM | POA: Diagnosis not present

## 2018-01-15 DIAGNOSIS — M25532 Pain in left wrist: Secondary | ICD-10-CM | POA: Diagnosis not present

## 2018-01-15 DIAGNOSIS — M79672 Pain in left foot: Secondary | ICD-10-CM | POA: Diagnosis not present
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
## 2018-01-19 ENCOUNTER — Encounter: Payer: Medicare HMO | Admitting: Internal Medicine

## 2018-02-10 ENCOUNTER — Other Ambulatory Visit: Payer: Self-pay | Admitting: Internal Medicine

## 2018-02-12 DIAGNOSIS — M79672 Pain in left foot: Secondary | ICD-10-CM | POA: Diagnosis not present

## 2018-02-12 DIAGNOSIS — B028 Zoster with other complications: Secondary | ICD-10-CM | POA: Diagnosis not present

## 2018-02-12 DIAGNOSIS — Z79891 Long term (current) use of opiate analgesic: Secondary | ICD-10-CM | POA: Diagnosis not present

## 2018-02-12 DIAGNOSIS — M79604 Pain in right leg: Secondary | ICD-10-CM | POA: Diagnosis not present

## 2018-02-12 DIAGNOSIS — G894 Chronic pain syndrome: Secondary | ICD-10-CM | POA: Diagnosis not present

## 2018-02-23 ENCOUNTER — Other Ambulatory Visit: Payer: Self-pay | Admitting: *Deleted

## 2018-02-23 ENCOUNTER — Other Ambulatory Visit: Payer: Self-pay | Admitting: Internal Medicine

## 2018-02-23 MED ORDER — PREGABALIN 75 MG PO CAPS: 75.0000 mg | ORAL_CAPSULE | Freq: Two times a day (BID) | ORAL | 0 refills | Status: DC

## 2018-02-23 NOTE — Telephone Encounter (Signed)
Please call [facility] and speak with Vivan about the medication Lyrica Please call [phone]

## 2018-02-23 NOTE — Telephone Encounter (Signed)
Pharmacist states the insurance is requiring a new script for lyrica, she states the feb 2019 script was filled 1x but now the insurance is requiring a new script

## 2018-03-10 ENCOUNTER — Other Ambulatory Visit: Payer: Self-pay | Admitting: Internal Medicine

## 2018-03-16 ENCOUNTER — Other Ambulatory Visit: Payer: Self-pay | Admitting: Internal Medicine

## 2018-03-16 NOTE — Telephone Encounter (Signed)
Please reschedule patient's no show appointment. Thanks!  Katherine Banks

## 2018-03-22 DIAGNOSIS — M79672 Pain in left foot: Secondary | ICD-10-CM | POA: Diagnosis not present

## 2018-03-22 DIAGNOSIS — G894 Chronic pain syndrome: Secondary | ICD-10-CM | POA: Diagnosis not present

## 2018-03-22 DIAGNOSIS — Z79891 Long term (current) use of opiate analgesic: Secondary | ICD-10-CM | POA: Diagnosis not present

## 2018-03-22 DIAGNOSIS — M79604 Pain in right leg: Secondary | ICD-10-CM | POA: Diagnosis not present

## 2018-04-10 ENCOUNTER — Other Ambulatory Visit: Payer: Self-pay | Admitting: Internal Medicine

## 2018-04-12 ENCOUNTER — Other Ambulatory Visit: Payer: Self-pay | Admitting: *Deleted

## 2018-04-12 DIAGNOSIS — M329 Systemic lupus erythematosus, unspecified: Secondary | ICD-10-CM

## 2018-04-12 MED ORDER — HYDROXYCHLOROQUINE SULFATE 200 MG PO TABS: 200.0000 mg | ORAL_TABLET | Freq: Two times a day (BID) | ORAL | 0 refills | Status: DC

## 2018-04-12 NOTE — Telephone Encounter (Signed)
Will give 40  day refill to last till next appointment. Will discuss medication at next appointment. Thanks!

## 2018-04-12 NOTE — Telephone Encounter (Signed)
Next appt scheduled  7/19 with PCP. 

## 2018-04-20 DIAGNOSIS — M79672 Pain in left foot: Secondary | ICD-10-CM | POA: Diagnosis not present

## 2018-04-20 DIAGNOSIS — Z79891 Long term (current) use of opiate analgesic: Secondary | ICD-10-CM | POA: Diagnosis not present

## 2018-04-20 DIAGNOSIS — M79604 Pain in right leg: Secondary | ICD-10-CM | POA: Diagnosis not present

## 2018-04-20 DIAGNOSIS — G894 Chronic pain syndrome: Secondary | ICD-10-CM | POA: Diagnosis not present

## 2018-04-22 ENCOUNTER — Other Ambulatory Visit: Payer: Self-pay | Admitting: Internal Medicine

## 2018-04-23 NOTE — Telephone Encounter (Signed)
Next appt scheduled  7/19 with PCP.

## 2018-04-25 DIAGNOSIS — R69 Illness, unspecified: Secondary | ICD-10-CM | POA: Diagnosis not present

## 2018-05-11 ENCOUNTER — Encounter: Payer: Self-pay | Admitting: Internal Medicine

## 2018-05-11 ENCOUNTER — Ambulatory Visit (INDEPENDENT_AMBULATORY_CARE_PROVIDER_SITE_OTHER): Payer: Medicare HMO | Admitting: Internal Medicine

## 2018-05-11 ENCOUNTER — Encounter (INDEPENDENT_AMBULATORY_CARE_PROVIDER_SITE_OTHER): Payer: Self-pay

## 2018-05-11 ENCOUNTER — Ambulatory Visit (HOSPITAL_COMMUNITY)
Admission: RE | Admit: 2018-05-11 | Discharge: 2018-05-11 | Disposition: A | Discharge status: Home / Self Care | Payer: Medicare HMO | Source: Ambulatory Visit | Attending: Internal Medicine | Admitting: Internal Medicine

## 2018-05-11 ENCOUNTER — Other Ambulatory Visit: Payer: Self-pay

## 2018-05-11 VITALS — BP 154/77 | HR 77 | Temp 98.2°F | Ht 66.0 in | Wt 272.4 lb

## 2018-05-11 DIAGNOSIS — R9389 Abnormal findings on diagnostic imaging of other specified body structures: Secondary | ICD-10-CM

## 2018-05-11 DIAGNOSIS — M898X2 Other specified disorders of bone, upper arm: Secondary | ICD-10-CM

## 2018-05-11 DIAGNOSIS — S82425A Nondisplaced transverse fracture of shaft of left fibula, initial encounter for closed fracture: Secondary | ICD-10-CM | POA: Diagnosis not present

## 2018-05-11 DIAGNOSIS — N6310 Unspecified lump in the right breast, unspecified quadrant: Secondary | ICD-10-CM | POA: Diagnosis not present

## 2018-05-11 DIAGNOSIS — C799 Secondary malignant neoplasm of unspecified site: Secondary | ICD-10-CM | POA: Diagnosis not present

## 2018-05-11 DIAGNOSIS — M79602 Pain in left arm: Secondary | ICD-10-CM

## 2018-05-11 DIAGNOSIS — S82832A Other fracture of upper and lower end of left fibula, initial encounter for closed fracture: Secondary | ICD-10-CM | POA: Diagnosis not present

## 2018-05-11 DIAGNOSIS — M79622 Pain in left upper arm: Secondary | ICD-10-CM

## 2018-05-11 DIAGNOSIS — M329 Systemic lupus erythematosus, unspecified: Secondary | ICD-10-CM

## 2018-05-11 DIAGNOSIS — R52 Pain, unspecified: Secondary | ICD-10-CM | POA: Diagnosis not present

## 2018-05-11 MED ORDER — DICLOFENAC SODIUM 1 % TD GEL: 2.0000 g | Freq: Four times a day (QID) | TRANSDERMAL | 3 refills | Status: DC | PRN

## 2018-05-11 MED ORDER — GABAPENTIN 400 MG PO CAPS: 400.0000 mg | ORAL_CAPSULE | Freq: Three times a day (TID) | ORAL | 2 refills | Status: DC
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
## 2018-05-11 NOTE — Patient Instructions (Signed)
Ms. Besser, we will get an x-ray of your arm today.  I have refilled your gabapentin and voltaren gel.  Please follow up with your PCP to discuss anticoagulation and your blood pressure.

## 2018-05-11 NOTE — Assessment & Plan Note (Signed)
Pt reports better relief of her joint pain from gabapentin than from lyrica and would like to switch back.    -Refilled gabapentin 400mg  TID PRN and discontinued lyrica

## 2018-05-12 ENCOUNTER — Encounter (HOSPITAL_COMMUNITY): Payer: Self-pay | Admitting: Emergency Medicine

## 2018-05-12 ENCOUNTER — Inpatient Hospital Stay (HOSPITAL_COMMUNITY)
Admission: EM | Admit: 2018-05-12 | Discharge: 2018-05-17 | DRG: 563 | Disposition: A | Discharge status: Skilled Nursing Facility | Payer: Medicare HMO | Attending: Internal Medicine | Admitting: Internal Medicine

## 2018-05-12 ENCOUNTER — Other Ambulatory Visit: Payer: Self-pay

## 2018-05-12 DIAGNOSIS — S82492D Other fracture of shaft of left fibula, subsequent encounter for closed fracture with routine healing: Secondary | ICD-10-CM | POA: Diagnosis not present

## 2018-05-12 DIAGNOSIS — F4310 Post-traumatic stress disorder, unspecified: Secondary | ICD-10-CM | POA: Diagnosis present

## 2018-05-12 DIAGNOSIS — Z8542 Personal history of malignant neoplasm of other parts of uterus: Secondary | ICD-10-CM

## 2018-05-12 DIAGNOSIS — Z91040 Latex allergy status: Secondary | ICD-10-CM

## 2018-05-12 DIAGNOSIS — M79605 Pain in left leg: Secondary | ICD-10-CM | POA: Diagnosis not present

## 2018-05-12 DIAGNOSIS — S82892A Other fracture of left lower leg, initial encounter for closed fracture: Secondary | ICD-10-CM

## 2018-05-12 DIAGNOSIS — N6320 Unspecified lump in the left breast, unspecified quadrant: Secondary | ICD-10-CM | POA: Diagnosis not present

## 2018-05-12 DIAGNOSIS — W1812XA Fall from or off toilet with subsequent striking against object, initial encounter: Secondary | ICD-10-CM | POA: Diagnosis not present

## 2018-05-12 DIAGNOSIS — Z90710 Acquired absence of both cervix and uterus: Secondary | ICD-10-CM

## 2018-05-12 DIAGNOSIS — W19XXXA Unspecified fall, initial encounter: Secondary | ICD-10-CM | POA: Diagnosis not present

## 2018-05-12 DIAGNOSIS — I10 Essential (primary) hypertension: Secondary | ICD-10-CM | POA: Diagnosis present

## 2018-05-12 DIAGNOSIS — W1811XA Fall from or off toilet without subsequent striking against object, initial encounter: Secondary | ICD-10-CM | POA: Diagnosis present

## 2018-05-12 DIAGNOSIS — G959 Disease of spinal cord, unspecified: Secondary | ICD-10-CM | POA: Diagnosis not present

## 2018-05-12 DIAGNOSIS — M329 Systemic lupus erythematosus, unspecified: Secondary | ICD-10-CM | POA: Diagnosis present

## 2018-05-12 DIAGNOSIS — K219 Gastro-esophageal reflux disease without esophagitis: Secondary | ICD-10-CM | POA: Diagnosis present

## 2018-05-12 DIAGNOSIS — Z79899 Other long term (current) drug therapy: Secondary | ICD-10-CM

## 2018-05-12 DIAGNOSIS — C801 Malignant (primary) neoplasm, unspecified: Secondary | ICD-10-CM

## 2018-05-12 DIAGNOSIS — W19XXXD Unspecified fall, subsequent encounter: Secondary | ICD-10-CM | POA: Diagnosis not present

## 2018-05-12 DIAGNOSIS — S82402D Unspecified fracture of shaft of left fibula, subsequent encounter for closed fracture with routine healing: Secondary | ICD-10-CM | POA: Diagnosis not present

## 2018-05-12 DIAGNOSIS — M879 Osteonecrosis, unspecified: Secondary | ICD-10-CM | POA: Diagnosis present

## 2018-05-12 DIAGNOSIS — M542 Cervicalgia: Secondary | ICD-10-CM | POA: Diagnosis present

## 2018-05-12 DIAGNOSIS — C799 Secondary malignant neoplasm of unspecified site: Secondary | ICD-10-CM | POA: Diagnosis not present

## 2018-05-12 DIAGNOSIS — S8992XA Unspecified injury of left lower leg, initial encounter: Secondary | ICD-10-CM | POA: Diagnosis not present

## 2018-05-12 DIAGNOSIS — N6310 Unspecified lump in the right breast, unspecified quadrant: Secondary | ICD-10-CM | POA: Diagnosis not present

## 2018-05-12 DIAGNOSIS — M1610 Unilateral primary osteoarthritis, unspecified hip: Secondary | ICD-10-CM | POA: Diagnosis not present

## 2018-05-12 DIAGNOSIS — J45909 Unspecified asthma, uncomplicated: Secondary | ICD-10-CM | POA: Diagnosis present

## 2018-05-12 DIAGNOSIS — Z86711 Personal history of pulmonary embolism: Secondary | ICD-10-CM

## 2018-05-12 DIAGNOSIS — Z978 Presence of other specified devices: Secondary | ICD-10-CM | POA: Diagnosis not present

## 2018-05-12 DIAGNOSIS — S82832A Other fracture of upper and lower end of left fibula, initial encounter for closed fracture: Secondary | ICD-10-CM

## 2018-05-12 DIAGNOSIS — Z87891 Personal history of nicotine dependence: Secondary | ICD-10-CM

## 2018-05-12 DIAGNOSIS — R52 Pain, unspecified: Secondary | ICD-10-CM | POA: Diagnosis not present

## 2018-05-12 DIAGNOSIS — F419 Anxiety disorder, unspecified: Secondary | ICD-10-CM | POA: Diagnosis present

## 2018-05-12 DIAGNOSIS — I700 Atherosclerosis of aorta: Secondary | ICD-10-CM | POA: Diagnosis present

## 2018-05-12 DIAGNOSIS — E041 Nontoxic single thyroid nodule: Secondary | ICD-10-CM | POA: Diagnosis not present

## 2018-05-12 DIAGNOSIS — C7951 Secondary malignant neoplasm of bone: Secondary | ICD-10-CM | POA: Diagnosis present

## 2018-05-12 DIAGNOSIS — M86651 Other chronic osteomyelitis, right thigh: Secondary | ICD-10-CM | POA: Diagnosis present

## 2018-05-12 DIAGNOSIS — Z96641 Presence of right artificial hip joint: Secondary | ICD-10-CM | POA: Diagnosis present

## 2018-05-12 DIAGNOSIS — S82425A Nondisplaced transverse fracture of shaft of left fibula, initial encounter for closed fracture: Secondary | ICD-10-CM | POA: Diagnosis not present

## 2018-05-12 DIAGNOSIS — Z87440 Personal history of urinary (tract) infections: Secondary | ICD-10-CM

## 2018-05-12 DIAGNOSIS — Z86718 Personal history of other venous thrombosis and embolism: Secondary | ICD-10-CM

## 2018-05-12 DIAGNOSIS — Z8049 Family history of malignant neoplasm of other genital organs: Secondary | ICD-10-CM

## 2018-05-12 DIAGNOSIS — R911 Solitary pulmonary nodule: Secondary | ICD-10-CM | POA: Diagnosis not present

## 2018-05-12 DIAGNOSIS — I739 Peripheral vascular disease, unspecified: Secondary | ICD-10-CM | POA: Diagnosis present

## 2018-05-12 DIAGNOSIS — Z7952 Long term (current) use of systemic steroids: Secondary | ICD-10-CM

## 2018-05-12 DIAGNOSIS — M8668 Other chronic osteomyelitis, other site: Secondary | ICD-10-CM | POA: Diagnosis not present

## 2018-05-12 DIAGNOSIS — S82302A Unspecified fracture of lower end of left tibia, initial encounter for closed fracture: Secondary | ICD-10-CM

## 2018-05-12 DIAGNOSIS — R51 Headache: Secondary | ICD-10-CM | POA: Diagnosis present

## 2018-05-12 DIAGNOSIS — S82492A Other fracture of shaft of left fibula, initial encounter for closed fracture: Secondary | ICD-10-CM | POA: Diagnosis not present

## 2018-05-12 DIAGNOSIS — J42 Unspecified chronic bronchitis: Secondary | ICD-10-CM | POA: Diagnosis present

## 2018-05-12 DIAGNOSIS — Z87442 Personal history of urinary calculi: Secondary | ICD-10-CM

## 2018-05-12 DIAGNOSIS — Z888 Allergy status to other drugs, medicaments and biological substances status: Secondary | ICD-10-CM

## 2018-05-12 DIAGNOSIS — Z7982 Long term (current) use of aspirin: Secondary | ICD-10-CM

## 2018-05-12 DIAGNOSIS — Z91048 Other nonmedicinal substance allergy status: Secondary | ICD-10-CM

## 2018-05-12 DIAGNOSIS — Y92002 Bathroom of unspecified non-institutional (private) residence single-family (private) house as the place of occurrence of the external cause: Secondary | ICD-10-CM

## 2018-05-12 DIAGNOSIS — S82892D Other fracture of left lower leg, subsequent encounter for closed fracture with routine healing: Secondary | ICD-10-CM | POA: Diagnosis not present

## 2018-05-12 DIAGNOSIS — G9589 Other specified diseases of spinal cord: Secondary | ICD-10-CM | POA: Diagnosis not present

## 2018-05-12 DIAGNOSIS — N63 Unspecified lump in unspecified breast: Secondary | ICD-10-CM | POA: Diagnosis not present

## 2018-05-12 DIAGNOSIS — T8130XA Disruption of wound, unspecified, initial encounter: Secondary | ICD-10-CM | POA: Diagnosis not present

## 2018-05-12 DIAGNOSIS — Z887 Allergy status to serum and vaccine status: Secondary | ICD-10-CM
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
## 2018-05-13 ENCOUNTER — Emergency Department (HOSPITAL_COMMUNITY): Payer: Medicare HMO

## 2018-05-13 ENCOUNTER — Inpatient Hospital Stay (HOSPITAL_COMMUNITY): Payer: Medicare HMO

## 2018-05-13 ENCOUNTER — Encounter (HOSPITAL_COMMUNITY): Payer: Self-pay | Admitting: Internal Medicine

## 2018-05-13 DIAGNOSIS — R41841 Cognitive communication deficit: Secondary | ICD-10-CM | POA: Diagnosis not present

## 2018-05-13 DIAGNOSIS — R279 Unspecified lack of coordination: Secondary | ICD-10-CM | POA: Diagnosis not present

## 2018-05-13 DIAGNOSIS — Z7982 Long term (current) use of aspirin: Secondary | ICD-10-CM

## 2018-05-13 DIAGNOSIS — S0990XA Unspecified injury of head, initial encounter: Secondary | ICD-10-CM | POA: Diagnosis not present

## 2018-05-13 DIAGNOSIS — R197 Diarrhea, unspecified: Secondary | ICD-10-CM | POA: Diagnosis not present

## 2018-05-13 DIAGNOSIS — E041 Nontoxic single thyroid nodule: Secondary | ICD-10-CM

## 2018-05-13 DIAGNOSIS — Z978 Presence of other specified devices: Secondary | ICD-10-CM | POA: Diagnosis not present

## 2018-05-13 DIAGNOSIS — S82402A Unspecified fracture of shaft of left fibula, initial encounter for closed fracture: Secondary | ICD-10-CM | POA: Diagnosis not present

## 2018-05-13 DIAGNOSIS — Z8049 Family history of malignant neoplasm of other genital organs: Secondary | ICD-10-CM

## 2018-05-13 DIAGNOSIS — Z993 Dependence on wheelchair: Secondary | ICD-10-CM

## 2018-05-13 DIAGNOSIS — F4310 Post-traumatic stress disorder, unspecified: Secondary | ICD-10-CM | POA: Diagnosis present

## 2018-05-13 DIAGNOSIS — M2170 Unequal limb length (acquired), unspecified site: Secondary | ICD-10-CM

## 2018-05-13 DIAGNOSIS — S199XXA Unspecified injury of neck, initial encounter: Secondary | ICD-10-CM | POA: Diagnosis not present

## 2018-05-13 DIAGNOSIS — S82892A Other fracture of left lower leg, initial encounter for closed fracture: Secondary | ICD-10-CM | POA: Diagnosis not present

## 2018-05-13 DIAGNOSIS — S82492A Other fracture of shaft of left fibula, initial encounter for closed fracture: Secondary | ICD-10-CM | POA: Diagnosis not present

## 2018-05-13 DIAGNOSIS — Z888 Allergy status to other drugs, medicaments and biological substances status: Secondary | ICD-10-CM

## 2018-05-13 DIAGNOSIS — F419 Anxiety disorder, unspecified: Secondary | ICD-10-CM | POA: Diagnosis present

## 2018-05-13 DIAGNOSIS — Z743 Need for continuous supervision: Secondary | ICD-10-CM | POA: Diagnosis not present

## 2018-05-13 DIAGNOSIS — X58XXXD Exposure to other specified factors, subsequent encounter: Secondary | ICD-10-CM | POA: Diagnosis not present

## 2018-05-13 DIAGNOSIS — N6310 Unspecified lump in the right breast, unspecified quadrant: Secondary | ICD-10-CM | POA: Diagnosis not present

## 2018-05-13 DIAGNOSIS — N6320 Unspecified lump in the left breast, unspecified quadrant: Secondary | ICD-10-CM | POA: Diagnosis not present

## 2018-05-13 DIAGNOSIS — C801 Malignant (primary) neoplasm, unspecified: Secondary | ICD-10-CM

## 2018-05-13 DIAGNOSIS — S82402S Unspecified fracture of shaft of left fibula, sequela: Secondary | ICD-10-CM | POA: Diagnosis not present

## 2018-05-13 DIAGNOSIS — J42 Unspecified chronic bronchitis: Secondary | ICD-10-CM | POA: Diagnosis present

## 2018-05-13 DIAGNOSIS — Z87891 Personal history of nicotine dependence: Secondary | ICD-10-CM

## 2018-05-13 DIAGNOSIS — K219 Gastro-esophageal reflux disease without esophagitis: Secondary | ICD-10-CM

## 2018-05-13 DIAGNOSIS — Z8269 Family history of other diseases of the musculoskeletal system and connective tissue: Secondary | ICD-10-CM

## 2018-05-13 DIAGNOSIS — R69 Illness, unspecified: Secondary | ICD-10-CM | POA: Diagnosis not present

## 2018-05-13 DIAGNOSIS — S82302A Unspecified fracture of lower end of left tibia, initial encounter for closed fracture: Secondary | ICD-10-CM | POA: Diagnosis not present

## 2018-05-13 DIAGNOSIS — M879 Osteonecrosis, unspecified: Secondary | ICD-10-CM

## 2018-05-13 DIAGNOSIS — I739 Peripheral vascular disease, unspecified: Secondary | ICD-10-CM

## 2018-05-13 DIAGNOSIS — Z91040 Latex allergy status: Secondary | ICD-10-CM

## 2018-05-13 DIAGNOSIS — Z887 Allergy status to serum and vaccine status: Secondary | ICD-10-CM

## 2018-05-13 DIAGNOSIS — Y92002 Bathroom of unspecified non-institutional (private) residence single-family (private) house as the place of occurrence of the external cause: Secondary | ICD-10-CM | POA: Diagnosis not present

## 2018-05-13 DIAGNOSIS — Z792 Long term (current) use of antibiotics: Secondary | ICD-10-CM

## 2018-05-13 DIAGNOSIS — Z87442 Personal history of urinary calculi: Secondary | ICD-10-CM | POA: Diagnosis not present

## 2018-05-13 DIAGNOSIS — R911 Solitary pulmonary nodule: Secondary | ICD-10-CM | POA: Diagnosis not present

## 2018-05-13 DIAGNOSIS — Z7952 Long term (current) use of systemic steroids: Secondary | ICD-10-CM

## 2018-05-13 DIAGNOSIS — R52 Pain, unspecified: Secondary | ICD-10-CM | POA: Diagnosis present

## 2018-05-13 DIAGNOSIS — G9589 Other specified diseases of spinal cord: Secondary | ICD-10-CM | POA: Diagnosis not present

## 2018-05-13 DIAGNOSIS — R0781 Pleurodynia: Secondary | ICD-10-CM | POA: Diagnosis not present

## 2018-05-13 DIAGNOSIS — Z8542 Personal history of malignant neoplasm of other parts of uterus: Secondary | ICD-10-CM

## 2018-05-13 DIAGNOSIS — Z79899 Other long term (current) drug therapy: Secondary | ICD-10-CM | POA: Diagnosis not present

## 2018-05-13 DIAGNOSIS — S82492D Other fracture of shaft of left fibula, subsequent encounter for closed fracture with routine healing: Secondary | ICD-10-CM | POA: Diagnosis not present

## 2018-05-13 DIAGNOSIS — R011 Cardiac murmur, unspecified: Secondary | ICD-10-CM

## 2018-05-13 DIAGNOSIS — W1812XA Fall from or off toilet with subsequent striking against object, initial encounter: Secondary | ICD-10-CM | POA: Diagnosis not present

## 2018-05-13 DIAGNOSIS — Z86718 Personal history of other venous thrombosis and embolism: Secondary | ICD-10-CM

## 2018-05-13 DIAGNOSIS — M8668 Other chronic osteomyelitis, other site: Secondary | ICD-10-CM | POA: Diagnosis not present

## 2018-05-13 DIAGNOSIS — Z885 Allergy status to narcotic agent status: Secondary | ICD-10-CM

## 2018-05-13 DIAGNOSIS — Z86711 Personal history of pulmonary embolism: Secondary | ICD-10-CM | POA: Diagnosis not present

## 2018-05-13 DIAGNOSIS — W1811XA Fall from or off toilet without subsequent striking against object, initial encounter: Secondary | ICD-10-CM | POA: Diagnosis not present

## 2018-05-13 DIAGNOSIS — R51 Headache: Secondary | ICD-10-CM | POA: Diagnosis present

## 2018-05-13 DIAGNOSIS — I10 Essential (primary) hypertension: Secondary | ICD-10-CM | POA: Diagnosis not present

## 2018-05-13 DIAGNOSIS — W19XXXD Unspecified fall, subsequent encounter: Secondary | ICD-10-CM | POA: Diagnosis not present

## 2018-05-13 DIAGNOSIS — G959 Disease of spinal cord, unspecified: Secondary | ICD-10-CM | POA: Diagnosis not present

## 2018-05-13 DIAGNOSIS — Z96641 Presence of right artificial hip joint: Secondary | ICD-10-CM | POA: Diagnosis present

## 2018-05-13 DIAGNOSIS — C7951 Secondary malignant neoplasm of bone: Secondary | ICD-10-CM | POA: Diagnosis not present

## 2018-05-13 DIAGNOSIS — M329 Systemic lupus erythematosus, unspecified: Secondary | ICD-10-CM

## 2018-05-13 DIAGNOSIS — Z90710 Acquired absence of both cervix and uterus: Secondary | ICD-10-CM

## 2018-05-13 DIAGNOSIS — S82402D Unspecified fracture of shaft of left fibula, subsequent encounter for closed fracture with routine healing: Secondary | ICD-10-CM | POA: Diagnosis not present

## 2018-05-13 DIAGNOSIS — N63 Unspecified lump in unspecified breast: Secondary | ICD-10-CM | POA: Diagnosis not present

## 2018-05-13 DIAGNOSIS — M1610 Unilateral primary osteoarthritis, unspecified hip: Secondary | ICD-10-CM | POA: Diagnosis not present

## 2018-05-13 DIAGNOSIS — Z91048 Other nonmedicinal substance allergy status: Secondary | ICD-10-CM

## 2018-05-13 DIAGNOSIS — J45909 Unspecified asthma, uncomplicated: Secondary | ICD-10-CM | POA: Diagnosis present

## 2018-05-13 DIAGNOSIS — S82832A Other fracture of upper and lower end of left fibula, initial encounter for closed fracture: Secondary | ICD-10-CM | POA: Diagnosis not present

## 2018-05-13 DIAGNOSIS — Z8739 Personal history of other diseases of the musculoskeletal system and connective tissue: Secondary | ICD-10-CM

## 2018-05-13 DIAGNOSIS — M86651 Other chronic osteomyelitis, right thigh: Secondary | ICD-10-CM | POA: Diagnosis not present

## 2018-05-13 DIAGNOSIS — Z79891 Long term (current) use of opiate analgesic: Secondary | ICD-10-CM

## 2018-05-13 DIAGNOSIS — S8992XA Unspecified injury of left lower leg, initial encounter: Secondary | ICD-10-CM | POA: Diagnosis not present

## 2018-05-13 DIAGNOSIS — M542 Cervicalgia: Secondary | ICD-10-CM | POA: Diagnosis not present

## 2018-05-13 DIAGNOSIS — T8130XA Disruption of wound, unspecified, initial encounter: Secondary | ICD-10-CM | POA: Diagnosis not present

## 2018-05-13 DIAGNOSIS — Z9889 Other specified postprocedural states: Secondary | ICD-10-CM

## 2018-05-13 DIAGNOSIS — Z7951 Long term (current) use of inhaled steroids: Secondary | ICD-10-CM

## 2018-05-13 DIAGNOSIS — S82892D Other fracture of left lower leg, subsequent encounter for closed fracture with routine healing: Secondary | ICD-10-CM | POA: Diagnosis not present

## 2018-05-13 DIAGNOSIS — I700 Atherosclerosis of aorta: Secondary | ICD-10-CM | POA: Diagnosis present

## 2018-05-13 LAB — CBC WITH DIFFERENTIAL/PLATELET
Basophils Absolute: 0.1 10*3/uL (ref 0.0–0.1)
Basophils Relative: 1 %
EOS ABS: 0.2 10*3/uL (ref 0.0–0.7)
EOS PCT: 2 %
HCT: 34.6 % — ABNORMAL LOW (ref 36.0–46.0)
Hemoglobin: 10 g/dL — ABNORMAL LOW (ref 12.0–15.0)
Lymphocytes Relative: 45 %
Lymphs Abs: 3.8 10*3/uL (ref 0.7–4.0)
MCH: 23.1 pg — ABNORMAL LOW (ref 26.0–34.0)
MCHC: 28.9 g/dL — ABNORMAL LOW (ref 30.0–36.0)
MCV: 79.9 fL (ref 78.0–100.0)
Monocytes Absolute: 0.4 10*3/uL (ref 0.1–1.0)
Monocytes Relative: 5 %
NEUTROS PCT: 47 %
Neutro Abs: 3.9 10*3/uL (ref 1.7–7.7)
Platelets: 300 10*3/uL (ref 150–400)
RBC: 4.33 MIL/uL (ref 3.87–5.11)
RDW: 18.6 % — ABNORMAL HIGH (ref 11.5–15.5)
WBC: 8.4 10*3/uL (ref 4.0–10.5)

## 2018-05-13 LAB — BASIC METABOLIC PANEL
Anion gap: 11 (ref 5–15)
BUN: 12 mg/dL (ref 6–20)
CO2: 23 mmol/L (ref 22–32)
Calcium: 8.9 mg/dL (ref 8.9–10.3)
Chloride: 109 mmol/L (ref 98–111)
Creatinine, Ser: 0.91 mg/dL (ref 0.44–1.00)
GFR calc non Af Amer: >60 mL/min (ref >60)
Glucose, Bld: 109 mg/dL — ABNORMAL HIGH (ref 70–99)
Potassium: 4 mmol/L (ref 3.5–5.1)
Sodium: 143 mmol/L (ref 135–145)

## 2018-05-13 MED ORDER — FLUTICASONE FUROATE-VILANTEROL 200-25 MCG/INH IN AEPB
1.0000 | INHALATION_SPRAY | Freq: Every day | RESPIRATORY_TRACT | Status: DC
  Administered 2018-05-13 – 2018-05-14 (×2): 1 via RESPIRATORY_TRACT
  Filled 2018-05-13: qty 28

## 2018-05-13 MED ORDER — HYDROXYCHLOROQUINE SULFATE 200 MG PO TABS
200.0000 mg | ORAL_TABLET | Freq: Two times a day (BID) | ORAL | Status: DC
  Administered 2018-05-13 – 2018-05-17 (×9): 200 mg via ORAL
  Filled 2018-05-13 (×9): qty 1

## 2018-05-13 MED ORDER — ALBUTEROL SULFATE (2.5 MG/3ML) 0.083% IN NEBU: 2.5000 mg | INHALATION_SOLUTION | Freq: Three times a day (TID) | RESPIRATORY_TRACT | Status: DC | PRN

## 2018-05-13 MED ORDER — GABAPENTIN 400 MG PO CAPS
400.0000 mg | ORAL_CAPSULE | Freq: Three times a day (TID) | ORAL | Status: DC
  Administered 2018-05-13 – 2018-05-17 (×14): 400 mg via ORAL
  Filled 2018-05-13 (×14): qty 1

## 2018-05-13 MED ORDER — ASPIRIN EC 81 MG PO TBEC
81.0000 mg | DELAYED_RELEASE_TABLET | Freq: Every day | ORAL | Status: DC
  Administered 2018-05-13 – 2018-05-17 (×5): 81 mg via ORAL
  Filled 2018-05-13 (×5): qty 1

## 2018-05-13 MED ORDER — DICYCLOMINE HCL 20 MG PO TABS
20.0000 mg | ORAL_TABLET | Freq: Three times a day (TID) | ORAL | Status: DC
  Administered 2018-05-13 – 2018-05-17 (×15): 20 mg via ORAL
  Filled 2018-05-13 (×15): qty 1

## 2018-05-13 MED ORDER — HYDROMORPHONE HCL 1 MG/ML IJ SOLN
1.0000 mg | INTRAMUSCULAR | Status: DC | PRN
  Administered 2018-05-13 – 2018-05-14 (×13): 1 mg via INTRAVENOUS
  Filled 2018-05-13 (×13): qty 1

## 2018-05-13 MED ORDER — OXYCODONE HCL ER 10 MG PO T12A
30.0000 mg | EXTENDED_RELEASE_TABLET | Freq: Two times a day (BID) | ORAL | Status: DC
  Administered 2018-05-13 – 2018-05-17 (×9): 30 mg via ORAL
  Filled 2018-05-13 (×9): qty 3

## 2018-05-13 MED ORDER — PANTOPRAZOLE SODIUM 40 MG PO TBEC
40.0000 mg | DELAYED_RELEASE_TABLET | Freq: Every day | ORAL | Status: DC
  Administered 2018-05-13 – 2018-05-17 (×5): 40 mg via ORAL
  Filled 2018-05-13 (×5): qty 1

## 2018-05-13 MED ORDER — ENOXAPARIN SODIUM 60 MG/0.6ML ~~LOC~~ SOLN
60.0000 mg | Freq: Every day | SUBCUTANEOUS | Status: DC
  Administered 2018-05-13 – 2018-05-17 (×5): 60 mg via SUBCUTANEOUS
  Filled 2018-05-13 (×5): qty 0.6

## 2018-05-13 MED ORDER — DULOXETINE HCL 30 MG PO CPEP
30.0000 mg | ORAL_CAPSULE | Freq: Every day | ORAL | Status: DC
  Administered 2018-05-13 – 2018-05-17 (×5): 30 mg via ORAL
  Filled 2018-05-13 (×5): qty 1

## 2018-05-13 MED ORDER — MONTELUKAST SODIUM 10 MG PO TABS
10.0000 mg | ORAL_TABLET | Freq: Every day | ORAL | Status: DC
  Administered 2018-05-13 – 2018-05-16 (×4): 10 mg via ORAL
  Filled 2018-05-13 (×4): qty 1

## 2018-05-13 MED ORDER — FUROSEMIDE 40 MG PO TABS
40.0000 mg | ORAL_TABLET | Freq: Two times a day (BID) | ORAL | Status: DC
  Administered 2018-05-13: 40 mg via ORAL
  Filled 2018-05-13: qty 1

## 2018-05-13 MED ORDER — FENTANYL CITRATE (PF) 100 MCG/2ML IJ SOLN
50.0000 ug | Freq: Once | INTRAMUSCULAR | Status: AC
  Administered 2018-05-13: 50 ug via INTRAVENOUS
  Filled 2018-05-13: qty 2

## 2018-05-13 MED ORDER — PREDNISONE 5 MG PO TABS
5.0000 mg | ORAL_TABLET | Freq: Four times a day (QID) | ORAL | Status: DC
  Administered 2018-05-13 – 2018-05-17 (×19): 5 mg via ORAL
  Filled 2018-05-13 (×19): qty 1

## 2018-05-13 MED ORDER — DICLOFENAC SODIUM 1 % TD GEL: 2.0000 g | Freq: Four times a day (QID) | TRANSDERMAL | Status: DC | PRN

## 2018-05-13 MED ORDER — OXYCODONE HCL 5 MG PO TABS
20.0000 mg | ORAL_TABLET | Freq: Four times a day (QID) | ORAL | Status: DC | PRN
  Administered 2018-05-13: 20 mg via ORAL
  Filled 2018-05-13: qty 4

## 2018-05-13 MED ORDER — IOHEXOL 240 MG/ML SOLN
100.0000 mL | Freq: Once | INTRAMUSCULAR | Status: AC | PRN
  Administered 2018-05-13: 100 mL via INTRAVENOUS

## 2018-05-13 MED ORDER — ONDANSETRON HCL 4 MG/2ML IJ SOLN
4.0000 mg | Freq: Once | INTRAMUSCULAR | Status: AC
  Administered 2018-05-13: 4 mg via INTRAVENOUS
  Filled 2018-05-13: qty 2

## 2018-05-13 MED ORDER — HEPARIN SODIUM (PORCINE) 5000 UNIT/ML IJ SOLN: 5000.0000 [IU] | Freq: Three times a day (TID) | INTRAMUSCULAR | Status: DC

## 2018-05-13 NOTE — Progress Notes (Signed)
Orthopedic Tech Progress Note Patient Details:  Katherine Banks Mar 18, 1966 [id number]  Ortho Devices Type of Ortho Device: Post (short leg) splint Ortho Device/Splint Location: lle. applied only a posterior short leg splint after speaking with dr. at drs request Ortho Device/Splint Interventions: Ordered, Application, Adjustment   Post Interventions Patient Tolerated: Well Instructions Provided: Care of device, Adjustment of device   Karolee Stamps 05/13/2018, 4:05 AM

## 2018-05-13 NOTE — Progress Notes (Signed)
PT Cancellation Note  Patient Details Name: KANESHIA CATER MRN: [id number] DOB: 07-May-1966   Cancelled Treatment:    Reason Eval/Treat Not Completed: Active bedrest order   Will continue to follow for appropriateness of PT eval as time allows;   Roney [place], [place]  Acute Rehabilitation Services Pager [phone] Office [place] 05/13/2018, 7:54 AM
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
## 2018-05-13 NOTE — ED Notes (Signed)
Patient transported to CT 
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
## 2018-05-13 NOTE — Plan of Care (Signed)

## 2018-05-14 DIAGNOSIS — M1610 Unilateral primary osteoarthritis, unspecified hip: Secondary | ICD-10-CM

## 2018-05-14 DIAGNOSIS — R52 Pain, unspecified: Secondary | ICD-10-CM

## 2018-05-14 DIAGNOSIS — N63 Unspecified lump in unspecified breast: Secondary | ICD-10-CM

## 2018-05-14 DIAGNOSIS — S82402D Unspecified fracture of shaft of left fibula, subsequent encounter for closed fracture with routine healing: Secondary | ICD-10-CM

## 2018-05-14 DIAGNOSIS — S82892D Other fracture of left lower leg, subsequent encounter for closed fracture with routine healing: Secondary | ICD-10-CM

## 2018-05-14 DIAGNOSIS — X58XXXD Exposure to other specified factors, subsequent encounter: Secondary | ICD-10-CM

## 2018-05-14 LAB — COMPREHENSIVE METABOLIC PANEL
ALT: 19 U/L (ref 0–44)
AST: 19 U/L (ref 15–41)
Albumin: 3.3 g/dL — ABNORMAL LOW (ref 3.5–5.0)
Alkaline Phosphatase: 124 U/L (ref 38–126)
Anion gap: 8 (ref 5–15)
BUN: 12 mg/dL (ref 6–20)
CALCIUM: 8.7 mg/dL — AB (ref 8.9–10.3)
CHLORIDE: 108 mmol/L (ref 98–111)
CO2: 27 mmol/L (ref 22–32)
CREATININE: 0.87 mg/dL (ref 0.44–1.00)
GFR calc Af Amer: >60 mL/min (ref >60)
Glucose, Bld: 156 mg/dL — ABNORMAL HIGH (ref 70–99)
Potassium: 3.8 mmol/L (ref 3.5–5.1)
Sodium: 143 mmol/L (ref 135–145)
Total Bilirubin: 0.3 mg/dL (ref 0.3–1.2)
Total Protein: 7 g/dL (ref 6.5–8.1)

## 2018-05-14 LAB — HIV ANTIBODY (ROUTINE TESTING W REFLEX): HIV Screen 4th Generation wRfx: NONREACTIVE

## 2018-05-14 LAB — CBC
HCT: 35.1 % — ABNORMAL LOW (ref 36.0–46.0)
Hemoglobin: 10.5 g/dL — ABNORMAL LOW (ref 12.0–15.0)
MCH: 23.8 pg — ABNORMAL LOW (ref 26.0–34.0)
MCHC: 29.9 g/dL — ABNORMAL LOW (ref 30.0–36.0)
MCV: 79.4 fL (ref 78.0–100.0)
PLATELETS: 351 10*3/uL (ref 150–400)
RBC: 4.42 MIL/uL (ref 3.87–5.11)
RDW: 18.6 % — ABNORMAL HIGH (ref 11.5–15.5)
WBC: 9.3 10*3/uL (ref 4.0–10.5)

## 2018-05-14 MED ORDER — HYDROMORPHONE HCL 1 MG/ML IJ SOLN
1.0000 mg | INTRAMUSCULAR | Status: DC | PRN
  Administered 2018-05-14 – 2018-05-17 (×17): 1 mg via INTRAVENOUS
  Filled 2018-05-14 (×17): qty 1

## 2018-05-14 MED ORDER — OXYCODONE HCL 5 MG PO TABS
20.0000 mg | ORAL_TABLET | Freq: Four times a day (QID) | ORAL | Status: DC
  Administered 2018-05-14 – 2018-05-17 (×12): 20 mg via ORAL
  Filled 2018-05-14 (×12): qty 4

## 2018-05-14 MED ORDER — OXYCODONE HCL 5 MG PO TABS: 20.0000 mg | ORAL_TABLET | Freq: Once | ORAL | Status: DC

## 2018-05-14 NOTE — Progress Notes (Signed)
Internal Medicine Attending  Date: 05/14/2018  Patient name: Katherine Banks Medical record number: [id number] Date of birth: 09-14-1966 Age: 52 y.o. Gender: female  I saw and evaluated the patient. I reviewed the resident's note by Dr. Myrtie Hawk and I agree with the resident's findings and plans as documented in her progress note.  When seen on rounds this morning [name] noted she had a difficult night secondary to pain in her left ankle. Apparently, the ward was quite busy and short staffed and she could not get her as needed pain medication on time.  We have looked up the medication she receives in the [facility] narcotic database and she gets OxyContin 30 mg by mouth twice daily and oxycodone 20 mg by mouth every 6 hours as needed. Given the fracture with increased pain we've decided to put her back on the OxyContin 30 mg by mouth twice daily and oxycodone 20 mg by mouth every 6 hours scheduled. We will then provide her with dilaudid 1 mg IV every 3 hours as needed for pain. We hope with this regimen we will be able to calculate her needs for the next several days until her pain improves. She was subsequently seen by OT and PT and they both feel she is a candidate for a skilled nursing facility. We will therefore also begin working on placement. The breast mass, pulmonary nodule, and thyroid nodules can be worked up as an outpatient.

## 2018-05-14 NOTE — Evaluation (Signed)
Physical Therapy Evaluation Patient Details Name: Katherine Banks MRN: [id number] DOB: 05/05/1966 Today's Date: 05/14/2018   History of Present Illness  Pt is a 52 y/o female who presents s/p fall at home, sustaining a L mid fibular shaft fracture, and posteriorly displaced fracture of the malleolus. A short leg splint was placed and no further treatment planned per ortho. Cervical CT revealed multiple lesions concerning for metastatic disease. 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 Pt refused a R hip disarticulation in 2016.   Clinical Impression  Pt admitted with above diagnosis. Pt currently with functional limitations due to the deficits listed below (see PT Problem List). At the time of PT eval pt was able to perform transfers with up to +2 max assist for balance support and safety. Pt reporting inaccessible home environment (cannot safely get into the bathroom), and decreased caregiver support as mother cannot physically assist her. Feel she could benefit from continued therapies at the SNF level to prepare for return home. Pt will benefit from skilled PT to increase their independence and safety with mobility to allow discharge to the venue listed below.       Follow Up Recommendations SNF;Supervision/Assistance - 24 hour    Equipment Recommendations  Rolling walker with 5" wheels    Recommendations for Other Services       Precautions / Restrictions Precautions Precautions: Fall Restrictions Weight Bearing Restrictions: Yes LLE Weight Bearing: Non weight bearing      Mobility  Bed Mobility Overal bed mobility: Needs Assistance Bed Mobility: Supine to Sit     Supine to sit: Min assist;HOB elevated     General bed mobility comments: min assist for management of L LE from supine to sitting   Transfers Overall transfer level: Needs  assistance Equipment used: Rolling walker (2 wheeled) Transfers: Sit to/from [facility] Sit to Stand: Max assist;+2 physical assistance Stand pivot transfers: Max assist;+2 physical assistance       General transfer comment: requires verbal cueing for hand placement, sequencing and safety, decreased initation (suspect fear and anxiety), heavy physical assistance with poor adherance to L LE precautions  Ambulation/Gait             General Gait Details: Unable  Stairs            Wheelchair Mobility    Modified Rankin (Stroke Patients Only)       Balance Overall balance assessment: Needs assistance Sitting-balance support: Bilateral upper extremity supported;Feet supported(L LE NWB ) Sitting balance-Leahy Scale: Fair Sitting balance - Comments: min guard for dynamic during LB dressing   Standing balance support: Bilateral upper extremity supported;During functional activity Standing balance-Leahy Scale: Poor Standing balance comment: reliant on BUE and external support                             Pertinent Vitals/Pain Pain Assessment: 0-10 Pain Score: 7  Pain Location: LLE at rest Pain Descriptors / Indicators: Discomfort;Grimacing;Crying Pain Intervention(s): Limited activity within patient's tolerance;Repositioned;Ice applied;Utilized relaxation techniques;Monitored during session;Premedicated before session    Home Living Family/patient expects to be discharged to:: Private residence Living Arrangements: Parent   Type of Home: House Home Access: Stairs to enter Entrance Stairs-Rails: Right;Left;Can reach both Technical brewer of Steps: 5 Home Layout: One level Home Equipment: Environmental consultant - 4 wheels;Wheelchair -  manual;Bedside commode      Prior Function Level of Independence: Needs assistance   Gait / Transfers Assistance Needed: Used the walker all the time. Ambulated around 10-15 feet at a time in the house and then used  wheelchair in the community.   ADL's / Homemaking Assistance Needed: Required assistance with ADLs, limited IADLs. Not working or driving.  Reports mobiliy into restroom by "shimmying" as RW does not fit.         Hand Dominance   Dominant Hand: Right    Extremity/Trunk Assessment   Upper Extremity Assessment Upper Extremity Assessment: LUE deficits/detail;Generalized weakness LUE Deficits / Details: reports injury from cane from her neice; limited ROM at shoulder and elbow due to pain (x-ray 7/12 with no injuries) LUE: Unable to fully assess due to pain(AROM shoulder FF 0-60 (PROM 0-90), elbow -20 extension) LUE Coordination: decreased gross motor    Lower Extremity Assessment Lower Extremity Assessment: Defer to PT evaluation RLE Deficits / Details: Extensive history of hip surgeries as outlined in HPI. As a result, RLE is significantly shorter than the LLE and is chronically weak.  LLE Deficits / Details: Decreased strength and AROM consistent with above mentioned injury. LLE: Unable to fully assess due to pain;Unable to fully assess due to immobilization    Cervical / Trunk Assessment Cervical / Trunk Assessment: Other exceptions Cervical / Trunk Exceptions: Forward head/rounded shoulder posture  Communication   Communication: No difficulties  Cognition Arousal/Alertness: Awake/alert Behavior During Therapy: Anxious Overall Cognitive Status: Impaired/Different from baseline Area of Impairment: Memory;Safety/judgement;Awareness;Problem solving                     Memory: Decreased short-term memory   Safety/Judgement: Decreased awareness of safety Awareness: Emergent Problem Solving: Slow processing;Requires verbal cues General Comments: Limited historian, reports conflicting information at times.       General Comments      Exercises     Assessment/Plan    PT Assessment Patient needs continued PT services  PT Problem List Decreased strength;Decreased  range of motion;Decreased activity tolerance;Decreased balance;Decreased mobility;Decreased knowledge of use of DME;Decreased safety awareness;Decreased knowledge of precautions;Pain       PT Treatment Interventions DME instruction;Gait training;Stair training;Functional mobility training;Therapeutic activities;Therapeutic exercise;Neuromuscular re-education;Patient/family education    PT Goals (Current goals can be found in the Care Plan section)  Acute Rehab PT Goals Patient Stated Goal: to go to rehab PT Goal Formulation: With patient Time For Goal Achievement: 05/28/18 Potential to Achieve Goals: Good    Frequency Min 3X/week   Barriers to discharge Decreased caregiver support Pt reports mother is not able to assist her physically at home    Co-evaluation PT/OT/SLP Co-Evaluation/Treatment: Yes Reason for Co-Treatment: Complexity of the patient's impairments (multi-system involvement);To address functional/ADL transfers;For patient/therapist safety PT goals addressed during session: Mobility/safety with mobility;Balance;Proper use of DME OT goals addressed during session: ADL's and self-care       AM-PAC PT "6 Clicks" Daily Activity  Outcome Measure Difficulty turning over in bed (including adjusting bedclothes, sheets and blankets)?: Unable Difficulty moving from lying on back to sitting on the side of the bed? : Unable Difficulty sitting down on and standing up from a chair with arms (e.g., wheelchair, bedside commode, etc,.)?: Unable Help needed moving to and from a bed to chair (including a wheelchair)?: Total Help needed walking in hospital room?: Total Help needed climbing 3-5 steps with a railing? : Total 6 Click Score: 6    End of Session Equipment Utilized During  Treatment: Gait belt Activity Tolerance: Patient limited by pain Patient left: in chair;with call bell/phone within reach;with chair alarm set Nurse Communication: Mobility status;Need for lift  equipment PT Visit Diagnosis: Pain;Difficulty in walking, not elsewhere classified (R26.2);Muscle weakness (generalized) (M62.81) Pain - Right/Left: Left Pain - part of body: Leg;Ankle and joints of foot    Time: 9798-9211 PT Time Calculation (min) (ACUTE ONLY): 36 min   Charges:   PT Evaluation $PT Eval Moderate Complexity: 1 Mod     PT G Codes:        Rolinda Roan, PT, DPT Acute Rehabilitation Services Pager: [phone]   Thelma Comp 05/14/2018, 2:33 PM

## 2018-05-14 NOTE — Plan of Care (Signed)

## 2018-05-14 NOTE — Progress Notes (Signed)
   Subjective: We visited and evaluated [name] today. She states that she has had pain on hid broken leg last night and asks for more scheduled pain medications.  Objective:  Vital signs in last 24 hours: Vitals:   05/13/18 2011 05/14/18 0359 05/14/18 0910 05/14/18 1505  BP: 119/71 130/78  136/79  Pulse: 90 87  95  Resp:  16  16  Temp: 98.4 F (36.9 C) 98.2 F (36.8 C)  98.1 F (36.7 C)  TempSrc: Oral Oral  Oral  SpO2: 95% 91% 92% 94%   Ph/E; Alert and oriented lady CV: Systolic murmur Lungs: Rale. Abdomen: soft and nontender Extremities: Left leg is splinted   Assessment/Plan:   Ankle and fibular fx: Splinted by ortho. Recommended to follow up as outpatient. PT OT eval to determine discharge destination. (SNF vs home)  -Oxycodone 20 mg once, continue PRN Dilaudid and PRN ER Oxycodoen   SLE: Currently asymptomatic -Continue Hydroxychloroquine and Prednison 5 mg 4 times a day  Hx of Breast mass+ current cervical spine lucent lesions. Patient has not followed up for her mass so far.  -Follow up out patinet  Hx of DVT, PE s/p IVC filter and stent: -Continue ASA 81 mg  Hip OA, AVN: +osteomyelititis: in medical record for Oct 2018, long term Amoxicilin therapy recommendation. Will ask ID recommendation about restarting Ab  Thyroid nodule/Pulm nodule: Outpatient follow up  Will curbside ID to decide about restarting Amoxicilin Dispo: Anticipated discharge in approximately 1 day(s).   Dewayne Hatch, MD 05/14/2018, 6:22 PM Pager: [phone]

## 2018-05-14 NOTE — Evaluation (Addendum)
Occupational Therapy Evaluation Patient Details Name: Katherine Banks MRN: [id number] DOB: 1966/06/24 Today's Date: 05/14/2018    History of Present Illness Pt is a 52 y/o female who presents s/p fall at home, sustaining a L mid fibular shaft fracture, and posteriorly displaced fracture of the malleolus. A short leg splint was placed and no further treatment planned per ortho. Cervical CT revealed multiple lesions concerning for metastatic disease. 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 Pt refused a R hip disarticulation in 2016.    Clinical Impression   PTA patient reports requiring assistance with bathing, but otherwise able to complete ADL with independence, limited IADLs, and reports unable to use RW in bathroom but completes mobility using RW.  She currently requires setup assist for UB ADL, max assist for LB dressing (+2 if standing), mod assist for LB bathing (+2 if standing), toileting with total assistance, and toilet transfers using RW with max assist +2.  She was admitted with the above dx and is limited by listed deficits (see problem list below), questionable historian as she reports conflicting information during session. Educated on precautions, safety, ADL compensatory techniques, pain management, edema control and recommendations. Based on performance today, patient will need short term SNF rehab in order to maximize independence with ADLs and mobility, as well as safety prior to dc home with her mothers support.  Will continue to follow while admitted.       Follow Up Recommendations  SNF;Supervision/Assistance - 24 hour    Equipment Recommendations  None recommended by OT    Recommendations for Other Services       Precautions / Restrictions Precautions Precautions: Fall Restrictions Weight Bearing Restrictions: Yes LLE Weight  Bearing: Non weight bearing      Mobility Bed Mobility Overal bed mobility: Needs Assistance Bed Mobility: Supine to Sit     Supine to sit: Min assist;HOB elevated     General bed mobility comments: min assist for management of L LE from supine to sitting   Transfers Overall transfer level: Needs assistance Equipment used: Rolling walker (2 wheeled) Transfers: Sit to/from [facility] Sit to Stand: Max assist;+2 physical assistance Stand pivot transfers: Max assist;+2 physical assistance       General transfer comment: requires verbal cueing for hand placement, sequencing and safety, decreased initation (suspect fear and anxiety), heavy physical assistance with poor adherance to L LE precautions    Balance Overall balance assessment: Needs assistance Sitting-balance support: Bilateral upper extremity supported;Feet supported(L LE NWB ) Sitting balance-Leahy Scale: Fair Sitting balance - Comments: min guard for dynamic during LB dressing   Standing balance support: Bilateral upper extremity supported;During functional activity Standing balance-Leahy Scale: Poor Standing balance comment: reliant on BUE and external support                           ADL either performed or assessed with clinical judgement   ADL Overall ADL's : Needs assistance/impaired Eating/Feeding: Supervision/ safety;Set up;Sitting   Grooming: Supervision/safety;Set up;Sitting   Upper Body Bathing: Set up;Sitting   Lower Body Bathing: Moderate assistance;+2 for physical assistance;Sit to/from stand Lower Body Bathing Details (indicate cue type and reason): reliant on B UE in standing, requires +2  Upper Body Dressing : Set up;Sitting   Lower Body Dressing: Sit to/from stand;Cueing for compensatory techniques;Cueing for safety;Cueing  for sequencing;Maximal assistance;+2 for physical assistance Lower Body Dressing Details (indicate cue type and reason): requires +2, reliant  on B UE in standing, total assist for clothing over hips; able to manage R sock Toilet Transfer: Maximal assistance;+2 for physical assistance;+2 for safety/equipment;Stand-pivot;BSC;RW(simulated to recliner) Toilet Transfer Details (indicate cue type and reason): verbal cueing for hand placement, safety and seqencing, limited ability to maintain NWB on L LE due to leg length discrepency. Heavy physical assistance x 2 Toileting- Clothing Manipulation and Hygiene: Total assistance;+2 for physical assistance;Sit to/from Nurse, children's Details (indicate cue type and reason): not safe at this time Functional mobility during ADLs: Rolling walker;Cueing for sequencing;Cueing for safety;+2 for physical assistance;Maximal assistance(sit to stand and stand pivot only) General ADL Comments: patient anxious throughout session, limited by pain and tolerance     Vision Baseline Vision/History: Wears glasses Wears Glasses: At all times Patient Visual Report: No change from baseline Vision Assessment?: No apparent visual deficits     Perception     Praxis      Pertinent Vitals/Pain Pain Assessment: 0-10 Pain Score: 7  Pain Location: LLE at rest Pain Descriptors / Indicators: Discomfort;Grimacing;Crying Pain Intervention(s): Limited activity within patient's tolerance;Repositioned;Ice applied;Utilized relaxation techniques;Monitored during session;Premedicated before session     Hand Dominance Right   Extremity/Trunk Assessment Upper Extremity Assessment Upper Extremity Assessment: LUE deficits/detail;Generalized weakness LUE Deficits / Details: reports injury from cane from her neice; limited ROM at shoulder and elbow due to pain (x-ray 7/12 with no injuries) LUE: Unable to fully assess due to pain(AROM shoulder FF 0-60 (PROM 0-90), elbow -20 extension) LUE Coordination: decreased gross motor   Lower Extremity Assessment Lower Extremity Assessment: Defer to PT evaluation RLE  Deficits / Details: Extensive history of hip surgeries as outlined in HPI. As a result, RLE is significantly shorter than the LLE and is chronically weak.  LLE Deficits / Details: Decreased strength and AROM consistent with above mentioned injury. LLE: Unable to fully assess due to pain;Unable to fully assess due to immobilization   Cervical / Trunk Assessment Cervical / Trunk Assessment: Other exceptions Cervical / Trunk Exceptions: Forward head/rounded shoulder posture   Communication Communication Communication: No difficulties   Cognition Arousal/Alertness: Awake/alert Behavior During Therapy: Anxious Overall Cognitive Status: Impaired/Different from baseline Area of Impairment: Memory;Safety/judgement;Awareness;Problem solving                     Memory: Decreased short-term memory   Safety/Judgement: Decreased awareness of safety Awareness: Emergent Problem Solving: Slow processing;Requires verbal cues General Comments: Limited historian, reports conflicting information at times.    General Comments       Exercises     Shoulder Instructions      Home Living Family/patient expects to be discharged to:: Private residence Living Arrangements: Parent   Type of Home: House Home Access: Stairs to enter Technical brewer of Steps: 5 Entrance Stairs-Rails: Right;Left;Can reach both [place]: One level     Bathroom Shower/Tub: Teacher, early years/pre: Standard Bathroom Accessibility: No   Home Equipment: Environmental consultant - 4 wheels;Wheelchair - manual;Bedside commode          Prior Functioning/Environment Level of Independence: Needs assistance  Gait / Transfers Assistance Needed: Used the walker all the time. Ambulated around 10-15 feet at a time in the house and then used wheelchair in the community.  ADL's / Homemaking Assistance Needed: Required assistance with ADLs, limited IADLs. Not working or driving.  Reports mobiliy into restroom by  "  shimmying" as RW does not fit.             OT Problem List: Decreased strength;Decreased range of motion;Decreased activity tolerance;Impaired balance (sitting and/or standing);Decreased coordination;Decreased safety awareness;Decreased knowledge of use of DME or AE;Decreased knowledge of precautions;Pain      OT Treatment/Interventions: Self-care/ADL training;Therapeutic exercise;DME and/or AE instruction;Therapeutic activities;Patient/family education;Balance training    OT Goals(Current goals can be found in the care plan section) Acute Rehab OT Goals Patient Stated Goal: to go to rehab OT Goal Formulation: With patient Time For Goal Achievement: 05/28/18 Potential to Achieve Goals: Fair  OT Frequency: Min 2X/week   Barriers to D/C:            Co-evaluation PT/OT/SLP Co-Evaluation/Treatment: Yes Reason for Co-Treatment: Complexity of the patient's impairments (multi-system involvement);To address functional/ADL transfers;For patient/therapist safety PT goals addressed during session: Mobility/safety with mobility;Balance;Proper use of DME OT goals addressed during session: ADL's and self-care      AM-PAC PT "6 Clicks" Daily Activity     Outcome Measure Help from another person eating meals?: None Help from another person taking care of personal grooming?: A Little Help from another person toileting, which includes using toliet, bedpan, or urinal?: Total Help from another person bathing (including washing, rinsing, drying)?: A Little Help from another person to put on and taking off regular upper body clothing?: None Help from another person to put on and taking off regular lower body clothing?: A Lot 6 Click Score: 17   End of Session Equipment Utilized During Treatment: Gait belt;Rolling walker Nurse Communication: Mobility status;Need for lift equipment;Precautions  Activity Tolerance: Patient tolerated treatment well;Patient limited by pain Patient left: in  chair;with call bell/phone within reach;with chair alarm set  OT Visit Diagnosis: Other abnormalities of gait and mobility (R26.89);Muscle weakness (generalized) (M62.81);Pain Pain - Right/Left: Left Pain - part of body: Ankle and joints of foot;Leg                Time: 9147-8295 OT Time Calculation (min): 35 min Charges:  OT General Charges $OT Visit: 1 Visit OT Evaluation $OT Eval Moderate Complexity: 1 Mod G-Codes:     Delight Stare, OTR/L  Pager [phone]   Delight Stare 05/14/2018, 1:53 PM

## 2018-05-15 ENCOUNTER — Encounter: Payer: Self-pay | Admitting: Internal Medicine

## 2018-05-15 ENCOUNTER — Other Ambulatory Visit: Payer: Self-pay

## 2018-05-15 DIAGNOSIS — G959 Disease of spinal cord, unspecified: Secondary | ICD-10-CM

## 2018-05-15 DIAGNOSIS — W19XXXD Unspecified fall, subsequent encounter: Secondary | ICD-10-CM

## 2018-05-15 NOTE — Clinical Social Work Note (Signed)
Clinical Social Work Assessment  Patient Details  Name: Katherine Banks MRN: [id number] Date of Birth: 1966-05-16  Date of referral:  05/15/18               Reason for consult:  Facility Placement, Discharge Planning                Permission sought to share information with:  Facility Sport and exercise psychologist, Family Supports Permission granted to share information::     Name::     Velora Heckler  Agency::  SNFs  Relationship::  Mother  Contact Information:  [phone]  Housing/Transportation Living arrangements for the past 2 months:  Single Family Home Source of Information:  Patient Patient Interpreter Needed:  None Criminal Activity/Legal Involvement Pertinent to Current Situation/Hospitalization:  No - Comment as needed Significant Relationships:  Other(Comment)(Hazel Pharr/ mother) Lives with:  Other (Comment)(mother) Do you feel safe going back to the place where you live?  No Need for family participation in patient care:  Yes (Comment)  Care giving concerns:  CSW received consult for discharge needs. CSW spoke with patient regarding PT recommendation of SNF placement at time of discharge. Patient reported that she lives in the home with her mother and her mother is unable to care for her.   Social Worker assessment / plan:  CSW spoke with patient concerning possibility of rehab at [facility] before returning home.   Employment status:  Other (Comment) Insurance information:  Medicare([employer]na) PT Recommendations:  [place] / Referral to community resources:     Patient/Family's Response to care:  Patient recognizes need for rehab before returning home and is agreeable to a SNF in [place]. Patient reported preference for [facility] but CSW explained [facility] is not in network with her insurance. CSW explained that per MD patient is medically stable for discharge but patient insurance requires several days to be authorized for  SNF. CSW offered a letter of guarantee at [facility] if approved by CSW Management . However, patient states she does not want to go to [facility]. CSW to staff case with management.  Patient/Family's Understanding of and Emotional Response to Diagnosis, Current Treatment, and Prognosis:  Patient is realistic regarding therapy needs and expressed being hopeful for SNF placement. Patient expressed understanding of CSW role and discharge process as well as medical condition. No questions/concerns about plan or treatment at this time.  Emotional Assessment Appearance:  Appears stated age Attitude/Demeanor/Rapport:  Engaged(cooperative) Affect (typically observed):  Accepting, Appropriate, Hopeful Orientation:  Oriented to Self, Oriented to Place, Oriented to Situation, Oriented to  Time Alcohol / Substance use:  Not Applicable Psych involvement (Current and /or in the community):  No (Comment)  Discharge Needs  Concerns to be addressed:  Care Coordination Readmission within the last 30 days:  No Current discharge risk:  Chronically ill, Dependent with Mobility Barriers to Discharge:  Continued Medical Work up   [employer], [place] 05/15/2018, 3:40 PM

## 2018-05-15 NOTE — NC FL2 (Signed)
[place] LEVEL OF CARE SCREENING TOOL     IDENTIFICATION  Patient Name: Katherine Banks Birthdate: [birth year] Sex: female Admission Date (Current Location): 05/12/2018  [facility] and [place] Number:  Herbalist and Address:  [facility], [place] [address] [place], [place] [address]      Provider Number: [id number]  Attending Physician Name and Address:  Bartholomew Crews, MD  Relative Name and Phone Number:  Velora Heckler    Current Level of Care: Hospital Recommended Level of Care: [place] Prior Approval Number:    Date Approved/Denied:   PASRR Number: [id number]  Discharge Plan: SNF    Current Diagnoses: Patient Active Problem List   Diagnosis Date Noted  . Pain   . 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 Ankle fracture, left 05/13/2018  . Pain of left humerus 05/11/2018  . Shingles rash 09/18/2017  . Group B streptococcal infection 08/17/2017  . Hematuria 06/20/2017  . Oliguria 06/20/2017  . Carpal tunnel syndrome of right wrist 06/20/2017  . Abnormal chest CT 03/17/2017  . Pruritus 02/18/2017  . Fatigue 01/25/2017  . Encounter for long-term use of opiate analgesic 01/11/2017  . History of uterine cancer 09/27/2016  . Hip osteomyelitis ([place]) 09/06/2016  . Thrombocytosis ([place]) 07/15/2016  . History of pulmonary embolus (PE) 04/26/2016  . Health care maintenance 03/18/2016  . Chronic osteomyelitis of hip ([place]) 11/30/2015  . HTN (hypertension) 11/30/2015  . History of inferior vena caval filter placement   . Lupus (systemic lupus erythematosus) ([place])     Orientation RESPIRATION BLADDER Height & Weight     Self, Time, Situation, Place  Normal Continent Weight:   Height:     BEHAVIORAL SYMPTOMS/MOOD NEUROLOGICAL BOWEL NUTRITION STATUS      Continent Diet(see discharge summary)  AMBULATORY STATUS COMMUNICATION OF NEEDS Skin    Limited Assist Verbally Normal                       Personal Care Assistance Level of Assistance  Bathing, Feeding, Dressing Bathing Assistance: Limited assistance Feeding assistance: Independent Dressing Assistance: Independent     Functional Limitations Info  Sight, Hearing, Speech Sight Info: Adequate Hearing Info: Adequate Speech Info: Adequate    SPECIAL CARE FACTORS FREQUENCY  PT (By licensed PT), OT (By licensed OT)     PT Frequency: 3x per wk OT Frequency: 2 x per wk            Contractures Contractures Info: Not present    Additional Factors Info  Code Status, Allergies Code Status Info: FULL Allergies Info: NKA           Current Medications (05/15/2018):  This is the current hospital active medication list Current Facility-Administered Medications  Medication Dose Route Frequency Provider Last Rate Last Dose  . albuterol (PROVENTIL) (2.5 MG/3ML) 0.083% nebulizer solution 2.5 mg  2.5 mg Nebulization Q8H PRN Molt, Bethany, DO      . aspirin EC tablet 81 mg  81 mg Oral Daily Molt, Bethany, DO   81 mg at 05/15/18 0930  . diclofenac sodium (VOLTAREN) 1 % transdermal gel 2 g  2 g Topical QID PRN Molt, Bethany, DO      . dicyclomine (BENTYL) tablet 20 mg  20 mg Oral TID AC Molt, Bethany, DO   20 mg at 05/15/18 0930  . DULoxetine (CYMBALTA) DR capsule 30 mg  30 mg Oral  Daily Molt, Bethany, DO   30 mg at 05/15/18 0930  . enoxaparin (LOVENOX) injection 60 mg  60 mg Subcutaneous Daily Johnnette Gourd D, RPH   60 mg at 05/15/18 0931  . fluticasone furoate-vilanterol (BREO ELLIPTA) 200-25 MCG/INH 1 puff  1 puff Inhalation Daily Molt, Bethany, DO   1 puff at 05/14/18 0910  . gabapentin (NEURONTIN) capsule 400 mg  400 mg Oral TID Molt, Bethany, DO   400 mg at 05/15/18 0930  . HYDROmorphone (DILAUDID) injection 1 mg  1 mg Intravenous Q3H PRN Lorella Nimrod, MD   1 mg at 05/15/18 0935  . hydroxychloroquine (PLAQUENIL) tablet 200 mg  200 mg Oral BID Molt, Bethany, DO   200  mg at 05/15/18 0930  . montelukast (SINGULAIR) tablet 10 mg  10 mg Oral QHS Molt, Bethany, DO   10 mg at 05/14/18 2227  . oxyCODONE (Oxy IR/ROXICODONE) immediate release tablet 20 mg  20 mg Oral Q6H Lorella Nimrod, MD   20 mg at 05/15/18 0930  . oxyCODONE (OXYCONTIN) 12 hr tablet 30 mg  30 mg Oral Q12H Molt, Bethany, DO   30 mg at 05/15/18 0931  . pantoprazole (PROTONIX) EC tablet 40 mg  40 mg Oral Daily Molt, Bethany, DO   40 mg at 05/15/18 0930  . predniSONE (DELTASONE) tablet 5 mg  5 mg Oral QID Molt, Bethany, DO   5 mg at 05/15/18 0930     Discharge Medications: Please see discharge summary for a list of discharge medications.  Relevant Imaging Results:  Relevant Lab Results:   Additional Information SSN [id number]  Vinie Sill, LCSWA

## 2018-05-15 NOTE — Progress Notes (Signed)
Sent patient a letter with results and detailing where to go from here

## 2018-05-15 NOTE — Progress Notes (Signed)
   Subjective:  Katherine Banks is doing ok. She says that er pain meds worked well last night. But the supportive pillow fel down and her leg rolled and got very painful   Objective:  Vital signs in last 24 hours: Vitals:   05/14/18 1505 05/14/18 1943 05/15/18 0402 05/15/18 1352  BP: 136/79 (!) 164/85 (!) 171/88 (!) 142/77  Pulse: 95 96 74 79  Resp: 16 17 18 16   Temp: 98.1 F (36.7 C) 98.4 F (36.9 C) 98.3 F (36.8 C) 97.7 F (36.5 C)  TempSrc: Oral Oral Oral Oral  SpO2: 94% 97% 98% 96%    Ph/E; Alert and oriented lady. In no acute distress CV: Nl S1S2, no murmur Lungs: Rale. Abdomen: Soft. Nl BS. and nontender Extremities: Left leg is splinted    Assessment/Plan:  Patient is stable from medical stand point and can be discharged to SNF as insurance issue addressed. social worker is working on it  Ankle and fibular fx: Splinted by ortho. follow up as outpatient.([facility] will schedule)  PT OT eval done, SNF recommended -Continue Oxycodone as scheduled and Dilauded PRN in hospital  SLE: Currently asymptomatic -Continue Hydroxychloroquine and Prednison 5 mg 4 times a day  Hx of Breast mass. Patient has not followed up for her mass so far since she has had a lot to do about her hip.  -Follow up out patient. We contacted breast center. they will call her to schedule for mammography current cervical spine lucent lesions We talked to radiology and reviewed CT scan again together. Had similar lesions in previous scan. Radiologist did not recommended any more diagnostic evaluation for cervical lucent lesions at this point but she definitely needs work up of breast mass  and decide accordingly.  If malignancy in breast suspected, PET scan for bone lesion may be helpful.    Hx of DVT, PE s/p IVC filter and stent: -Continue ASA 81 mg   Hip OA, AVN: Continue follow up with orthopedics outpatient.  (Regarding need for antibiotic at this point, ID recommended no antibiotic is  needed)  Out patient follow up for Pulm nodule and thyroid nodule  Dispo: Patient is stable from medical stand point and can be discharged to SNF as insurance issue addressed. social worker is working on it  [employer], Dorthula Rue, MD 05/15/2018, 4:41 PM Pager: [phone]

## 2018-05-15 NOTE — Progress Notes (Signed)
Internal Medicine Attending:   I saw and examined the patient. I reviewed the resident's note and I agree with the resident's findings and plan as documented in the resident's note.  Patient states that her pain is better controlled today.  Patient was initially admitted status post mechanical fall and was found to have fractures of the left fibula as well as left posterior malleolus.  We will continue with pain control for now.  Left lower extremity has been splinted.  Case was discussed with orthopedics by resident.  Patient will need follow-up with orthopedics in 1 week as an outpatient.  PT/OT recommending SNF placement.  Patient stable for discharge to SNF once bed is available.  Of note, patient also has a history of a breast mass for which she has not followed up and was also noted to have cervical spine lucencies which were concerning for metastatic disease.  Resident discussed case with radiology and she was noted to have similar lesions on her previous scan.  Patient will need close follow-up as an outpatient for breast mass and if malignancy suspected she will likely need a PET scan to look for metastases including in her cervical spine.

## 2018-05-15 NOTE — Progress Notes (Addendum)
Internal Medicine Clinic Attending  Case discussed with Dr. Winfrey  at the time of the visit.  We reviewed the resident's history and exam and pertinent patient test results.  I agree with the assessment, diagnosis, and plan of care documented in the resident's note.  

## 2018-05-16 DIAGNOSIS — Z9114 Patient's other noncompliance with medication regimen: Secondary | ICD-10-CM

## 2018-05-16 DIAGNOSIS — T8130XA Disruption of wound, unspecified, initial encounter: Secondary | ICD-10-CM

## 2018-05-16 MED ORDER — ZOLPIDEM TARTRATE 10 MG PO TABS: 10.0000 mg | ORAL_TABLET | Freq: Every evening | ORAL | 5 refills | Status: AC | PRN

## 2018-05-16 MED ORDER — HYDROMORPHONE HCL 8 MG PO TABS: 8.0000 mg | ORAL_TABLET | Freq: Four times a day (QID) | ORAL | 0 refills | Status: DC | PRN

## 2018-05-16 MED ORDER — OXYCODONE HCL ER 30 MG PO T12A: 30.0000 mg | EXTENDED_RELEASE_TABLET | Freq: Two times a day (BID) | ORAL | 0 refills | Status: AC

## 2018-05-16 MED ORDER — AMOXICILLIN 500 MG PO CAPS
500.0000 mg | ORAL_CAPSULE | Freq: Three times a day (TID) | ORAL | Status: DC
  Administered 2018-05-16 – 2018-05-17 (×4): 500 mg via ORAL
  Filled 2018-05-16 (×5): qty 1

## 2018-05-16 MED ORDER — AMOXICILLIN 500 MG PO CAPS: 500.0000 mg | ORAL_CAPSULE | Freq: Three times a day (TID) | ORAL | Status: AC

## 2018-05-16 MED ORDER — OXYCODONE HCL 20 MG PO TABS: 20.0000 mg | ORAL_TABLET | Freq: Four times a day (QID) | ORAL | 0 refills | Status: DC

## 2018-05-16 NOTE — Care Management Important Message (Signed)
Important Message  Patient Details  Name: Katherine Banks MRN: [id number] Date of Birth: Dec 24, 1965   Medicare Important Message Given:  Yes    Orbie Pyo 05/16/2018, 3:04 PM

## 2018-05-16 NOTE — Clinical Social Work Placement (Signed)
   CLINICAL SOCIAL WORK PLACEMENT  NOTE  Date:  05/16/2018  Patient Details  Name: Katherine Banks MRN: [id number] Date of Birth: June 22, 1966  Clinical Social Work is seeking post-discharge placement for this patient at the [place] level of care (*CSW will initial, date and re-position this form in  chart as items are completed):  Yes   Patient/family provided with [facility] Work Department's list of facilities offering this level of care within the geographic area requested by the patient (or if unable, by the patient's family).  Yes   Patient/family informed of their freedom to choose among providers that offer the needed level of care, that participate in Medicare, Medicaid or managed care program needed by the patient, have an available bed and are willing to accept the patient.  Yes   Patient/family informed of [place]'s ownership interest in [facility] and [facility], as well as of the fact that they are under no obligation to receive care at these facilities.  PASRR submitted to EDS on       PASRR number received on       Existing PASRR number confirmed on 05/15/18     FL2 transmitted to all facilities in geographic area requested by pt/family on 05/15/18     FL2 transmitted to all facilities within larger geographic area on       Patient informed that his/her managed care company has contracts with or will negotiate with certain facilities, including the following:        Yes   Patient/family informed of bed offers received.  Patient chooses bed at [facility]     Physician recommends and patient chooses bed at      Patient to be transferred to [facility] on 05/16/18.  Patient to be transferred to facility by [facility]     Patient family notified on 05/16/18 of transfer.  Name of family member notified:  patient contacting her mother     PHYSICIAN Please prepare priority discharge summary, including medications      Additional Comment:    _______________________________________________ Vinie Sill, [place] 05/16/2018, 4:39 PM

## 2018-05-16 NOTE — Progress Notes (Signed)
   Subjective: Patient was feeling little better when seen this morning.  She continued to experience some pain in her left leg.  She was also complaining of seeing some discharge from her right buttock.  Objective:  Vital signs in last 24 hours: Vitals:   05/15/18 1352 05/15/18 2047 05/16/18 0458 05/16/18 0947  BP: (!) 142/77 (!) 151/81 (!) 163/80   Pulse: 79 76 83   Resp: 16 18 18    Temp: 97.7 F (36.5 C) 97.8 F (36.6 C) 98 F (36.7 C)   TempSrc: Oral Oral Oral   SpO2: 96% 95% 95%   Weight:    272 lb 7.8 oz (123.6 kg)  Height:    5\' 6"  (1.676 m)   General.  Well-developed, well-nourished, obese lady, in no acute distress. Lungs.  Clear bilaterally, normal work of breathing. CV.  Regular rate and rhythm. Abdomen.  Soft, obese, nontender, bowel sounds positive. Extremities.  Left leg with splint and Ace wrap. Skin.  Small scar dehiscence on posterior right buttock with some bloody discharge.  Assessment/Plan:  Ankle and fibular fx: Splinted according to orthopedic advice.  Patient will follow-up with orthopedic next week as an outpatient. Continue to experience some breakthrough pain-although overall improving with scheduled home dose of extended release oxycodone, immediate release oxycodone and Dilaudid as needed. As patient needs a lot of assistance with these current fractures, this was her good leg as she is having multiple surgeries on right leg, she qualify for skilled nursing facility, will be discharged today.  Scar dehiscence.  She is having a small dehiscence at 1 of her old scar on posterior buttock, there was some bloody discharge, no purulence.  Talked with Dr. Marlou Sa, her orthopedic, he will look at it during her outpatient follow-up. -Place a gauze dressing on that area.  SLE: Currently asymptomatic -Continue Hydroxychloroquine and Prednison 5 mg 4 times a day  Hx of Breast mass.  Made an appointment with breast center, advised the patient to follow-up for  mammogram.  current cervical spine lucent lesions We talked to radiology and reviewed CT scan again together. Had similar lesions in previous scan. Radiologist did not recommended any more diagnostic evaluation for cervical lucent lesions at this point but she definitely needs work up of breast mass  and decide accordingly. -If malignancy in breast suspected, PET scan for bone lesion may be helpful.   Chronic right hip osteomyelitis/AVN.  Patient was not on any antibiotics according to home meds.  After getting some wound dehiscence she told us that she was supposed to take antibiotics for a long time and was given a prescription for 1 year.  [facility] [address] and find out that she has a year worth of refills on amoxicillin but she was not taking it very consistently.  She had first filled up in October 2018, second in January 2019 and third in June 2019. We restarted her on amoxicillin 500 mg every 8 hourly.  Hx of DVT, PE s/p IVC filter and stent: -Continue ASA 81 mg  Dispo: Anticipated discharge in approximately 0 day(s).   Dewayne Hatch, MD 05/16/2018, 12:43 PM

## 2018-05-16 NOTE — Progress Notes (Addendum)
Pt informed this RN that she had called [place] to appeal her discharge and provided reference number: [id number]. Per covering care manager, pt's d/c is canceled and will discuss appeal with pt. Reesa Chew, MD aware. Will continue to monitor.   1843: Doris, [facility] of Nursing at [facility] aware. Will continue to monitor.

## 2018-05-16 NOTE — Plan of Care (Signed)

## 2018-05-16 NOTE — Consult Note (Signed)
[facility] CM Primary Care Navigator  05/16/2018  Katherine Banks [birth year] [id number]   Met withpatient at the bedside to identify possible discharge needs.  Patientreportsthatshe had a "fall in the bathroom at home" that resulted to this admission. (Left mid fibular shaft fracture; Posteriorly displaced fracture of left malleolus)  Patient [facility].   [facility] [address].  Patient has beenmanaging herown medications at homeusing "pill box" system filled every week.  Patient reports that she uses Social Services transportation and goes to her doctors'appointment using [employer] transportation.  Patient states that she lives with her mother who is her primary caregiver at home.  Anticipated discharge plan is skilled nursing facility ([facility]- [facility]) for rehabilitation per patient. She mentioned that she appealed her discharge today- RN and Transport team were made aware of it.  Patientvoiced understanding to call primary care provider's office when shewill return home,for a post discharge follow-upvisitwithin 1- 2 weeks or sooner if needed.Patient letter (with PCP's contact number) was provided as a reminder.  Explained to patient regarding [facility] CM services available for healthmanagementand resourcesat home but she denies any current needs or concerns at this point. Patient verbalizedunderstandingof needto seekreferral from primary care provider to [facility] care management ifdeemed necessary and appropriatefor anyservicesin the nearfuture- once she returns backhome.   [facility] care management information provided for future needs that patient may have.   Primary care provider's office is listed as providing transition of care (TOC) follow-up.    For additional questions please  contact:  Edwena Felty A. Zachery Niswander, BSN, RN-BC [facility] PRIMARY CARE Navigator Cell: [phone]
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
## 2018-05-16 NOTE — Progress Notes (Signed)
Physical Therapy Treatment Patient Details Name: Katherine Banks MRN: [id number] DOB: August 04, 1966 Today's Date: 05/16/2018    History of Present Illness Pt is a 52 y/o female who presents s/p fall at home, sustaining a L mid fibular shaft fracture, and posteriorly displaced fracture of the malleolus. A short leg splint was placed and no further treatment planned per ortho. Cervical CT revealed multiple lesions concerning for metastatic disease. 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 Pt refused a R hip disarticulation in 2016.     PT Comments    Pt performed bed mobility and functional mobility with decreased assistance.  She is able to use cross bar with lift equipment to achieve standing with min guard assistance.  Pt able to perform multiple trials and upper trunk control and hip extension improved as trials progressed.  She remains to benefit from skilled rehab in a post acute setting until her strength improves.      Follow Up Recommendations  SNF;Supervision/Assistance - 24 hour     Equipment Recommendations  Rolling walker with 5" wheels    Recommendations for Other Services       Precautions / Restrictions Precautions Precautions: Fall Restrictions Weight Bearing Restrictions: Yes LLE Weight Bearing: Non weight bearing    Mobility  Bed Mobility Overal bed mobility: Needs Assistance Bed Mobility: Supine to Sit     Supine to sit: Min assist;HOB elevated     General bed mobility comments: Required increased assistance to manage LLE, used pillow to support LE to edge of bed.     Transfers Overall transfer level: Needs assistance Equipment used: None(sara stedy, sit to stand lift.  ) Transfers: Sit to/from Stand Sit to Stand: Min guard         General transfer comment: Pt able to pull into standing and maintain NWB with use of  sara stedy frame.  She performed sit to stand x3 reps with cues for hip extension and upper trunk control.  Pt gained confidence as stading trials progressed.   Cues for hand placement to use device.    Ambulation/Gait Ambulation/Gait assistance: (NT)               Stairs             Wheelchair Mobility    Modified Rankin (Stroke Patients Only)       Balance Overall balance assessment: Needs assistance Sitting-balance support: Bilateral upper extremity supported;Feet supported Sitting balance-Leahy Scale: Fair Sitting balance - Comments: deferred   Standing balance support: Bilateral upper extremity supported;During functional activity Standing balance-Leahy Scale: Poor Standing balance comment: reliant on BUE and external support                            Cognition Arousal/Alertness: Awake/alert Behavior During Therapy: WFL for tasks assessed/performed Overall Cognitive Status: Within Functional Limits for tasks assessed Area of Impairment: Memory;Safety/judgement;Awareness;Problem solving                               General Comments: Pt appropriate throughout session and motivated to participate.        Exercises      General Comments        Pertinent Vitals/Pain Pain Assessment: 0-10 Pain Score: 10-Worst pain ever Pain Location: L  LE and  hip area Pain Descriptors / Indicators: Grimacing;Aching;Sore;Throbbing Pain Intervention(s): Monitored during session;Repositioned;Ice applied    Home Living                      Prior Function            PT Goals (current goals can now be found in the care plan section) Acute Rehab PT Goals Patient Stated Goal: to go to rehab Potential to Achieve Goals: Good Progress towards PT goals: Progressing toward goals    Frequency    Min 3X/week      PT Plan Current plan remains appropriate    Co-evaluation              AM-PAC PT "6 Clicks" Daily Activity   Outcome Measure  Difficulty turning over in bed (including adjusting bedclothes, sheets and blankets)?: Unable Difficulty moving from lying on back to sitting on the side of the bed? : Unable Difficulty sitting down on and standing up from a chair with arms (e.g., wheelchair, bedside commode, etc,.)?: Unable Help needed moving to and from a bed to chair (including a wheelchair)?: A Little Help needed walking in hospital room?: Total Help needed climbing 3-5 steps with a railing? : Total 6 Click Score: 8    End of Session Equipment Utilized During Treatment: Gait belt Activity Tolerance: Patient limited by pain Patient left: in chair;with call bell/phone within reach;with chair alarm set Nurse Communication: Mobility status;Need for lift equipment(sara stedy) PT Visit Diagnosis: Pain;Difficulty in walking, not elsewhere classified (R26.2);Muscle weakness (generalized) (M62.81) Pain - Right/Left: Left Pain - part of body: Leg;Ankle and joints of foot     Time: 8527-7824 PT Time Calculation (min) (ACUTE ONLY): 30 min  Charges:  $Therapeutic Activity: 23-37 mins                    G CodesGovernor Rooks, PTA pager [phone]    Cristela Blue 05/16/2018, 3:58 PM

## 2018-05-16 NOTE — Plan of Care (Signed)
  Problem: Education: Goal: Knowledge of General Education information will improve Outcome: Progressing   

## 2018-05-16 NOTE — Progress Notes (Addendum)
Internal Medicine Attending:   I saw and examined the patient. I reviewed the resident's note and I agree with the resident's findings and plan as documented in the resident's note.  Patient states that she still has persistent pain in her left leg at the site of her fracture. She also complained of some serosanguineous discharge at the site of her old surgery in her right hip.  Patient to follow-up with orthopedic surgery next week for her left ankle and fibular fracture as well as this new discharge from her right hip.  Continue with pain control for now.  Patient states that she has been on amoxicillin at home chronically for her right hip chronic osteomyelitis.  Resident called patient's pharmacy and found out that she has not been taking this medication consistently.  She filled it in October 2018, January 2019 and June 2019 but states that she wants to continue to take this medication.  We will restart amoxicillin for now.  Patient has no leukocytosis and no fevers no signs of recurrent infection currently.  Resident discussed case with orthopedic surgery regarding new discharge from right hip.  They said they will evaluate her in the office next week.  Of note, patient needs follow-up as an outpatient for her breast mass as well as multiple lucencies in her cervical spine.  Patient has an appointment with the breast center for follow-up mammogram.  If breast mass is suspicious for malignancy patient will also require a PET scan to rule out metastatic disease especially in the cervical spine. Patient to follow-up in [facility] for further work-up.  No further work-up at this time.  Patient stable for discharge to SNF today.

## 2018-05-16 NOTE — Progress Notes (Signed)
AVS, printed prescriptions, and social worker's paperwork placed in discharge packet for [facility]. Report called and given to Cloyd Stagers, Therapist, sports. All questions answered to satisfaction. [facility] providing transportation.

## 2018-05-16 NOTE — Progress Notes (Signed)
3:50- CSW received call from [facility] that patient is ready for discharge, CSW will arrange transport.  2:30-CSW went to talk with patient again due to discharge confusion. Patient stated she was uncomfortable with being discharged today. CSW notified RNCM.  10:30 - CSW spoke with patient to confirm discharge plan. Patient stated she reviewed facility list and would like to go to [facility].    Thurmond Butts, [place] Social Worker [phone]

## 2018-05-16 NOTE — Progress Notes (Signed)
Noted scant serosanguineous drainage on Pt's scar from previous surgery on R buttocks. Pt stated it is starting to hurt. Cleansed and placed dry gauze, MD notified.

## 2018-05-16 NOTE — Progress Notes (Addendum)
Occupational Therapy Treatment Patient Details Name: Katherine Banks MRN: [id number] DOB: May 26, 1966 Today's Date: 05/16/2018    History of present illness Pt is a 52 y/o female who presents s/p fall at home, sustaining a L mid fibular shaft fracture, and posteriorly displaced fracture of the malleolus. A short leg splint was placed and no further treatment planned per ortho. Cervical CT revealed multiple lesions concerning for metastatic disease. 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 Pt refused a R hip disarticulation in 2016.    OT comments  pt declined OOB and  sitting EOB for ADL activities, agreeable to very brief bed level activity. Pt very anxious and preoccupied with R hip wound area bleeding. Pt reports that her tooth fell out while eating lunch right before OT arrival. OT called RN to report pt tooth had come out. Pt reports 10/10 pain in L ankle. Pt stating to OT that she does not want to participate in PT session scheduled for today, OT informed PTA. OT assistd pt with scooting to [facility] with pt using headboard and rails to pull self up in bed. OT positioned pt's L LE in elevation with 1 pillow and towel roll laterally at foot/ankle. OT will continue to follow acutely.  Follow Up Recommendations  SNF;Supervision/Assistance - 24 hour    Equipment Recommendations  None recommended by OT    Recommendations for Other Services      Precautions / Restrictions Precautions Precautions: Fall Restrictions Weight Bearing Restrictions: Yes LLE Weight Bearing: Non weight bearing       Mobility Bed Mobility Overal bed mobility: Needs Assistance             General bed mobility comments: pt able to pull self up to [facility] with LEs using headboard and rails. pt required total A to position L LE in elevation with pillows and towel roll laterally to  prevent foot/ankle from sliding towards EOB  Transfers                      Balance       Sitting balance - Comments: deferred                                   ADL either performed or assessed with clinical judgement   ADL Overall ADL's : Needs assistance/impaired                                       General ADL Comments: pt declined any ADLs sitting EOB this session. Pt reports that she's in a bad mood today but was agreeable to veyr brief bed level activity      Vision Baseline Vision/History: Wears glasses Wears Glasses: At all times Patient Visual Report: No change from baseline     Perception     Praxis      Cognition Arousal/Alertness: Awake/alert Behavior During Therapy: WFL for tasks assessed/performed Overall Cognitive Status: Impaired/Different from baseline Area of Impairment: Memory;Safety/judgement;Awareness;Problem solving                               General Comments: pt declined OOB and  sitting EOB for ADL activities. Pt very anxious and preoccupied with R hip wound area bleeding. Pt reporst that her tooth fell out while eating lunch right before OT arrival        Exercises     Shoulder Instructions       General Comments      Pertinent Vitals/ Pain       Pain Assessment: 0-10 Pain Score: 10-Worst pain ever Pain Location: L LE and  hip area Pain Descriptors / Indicators: Grimacing;Aching;Sore;Throbbing Pain Intervention(s): Limited activity within patient's tolerance;Monitored during session;Repositioned;Patient requesting pain meds-RN notified  Home Living                                          Prior Functioning/Environment              Frequency  Min 2X/week        Progress Toward Goals  OT Goals(current goals can now be found in the care plan section)  Progress towards OT goals: OT to reassess next treatment  Acute Rehab OT Goals Patient Stated  Goal: to go to rehab  Plan Discharge plan remains appropriate    Co-evaluation                 AM-PAC PT "6 Clicks" Daily Activity     Outcome Measure   Help from another person eating meals?: None Help from another person taking care of personal grooming?: A Little Help from another person toileting, which includes using toliet, bedpan, or urinal?: Total Help from another person bathing (including washing, rinsing, drying)?: A Little Help from another person to put on and taking off regular upper body clothing?: None Help from another person to put on and taking off regular lower body clothing?: Total 6 Click Score: 16    End of Session    OT Visit Diagnosis: Other abnormalities of gait and mobility (R26.89);Muscle weakness (generalized) (M62.81);Pain Pain - Right/Left: (right hip, L LE) Pain - part of body: Ankle and joints of foot;Leg;Hip   Activity Tolerance Patient limited by pain   Patient Left in bed;with call bell/phone within reach   Nurse Communication Other (comment)(pt reports tooth fell out during lunch)    Functional Assessment Tool Used: AM-PAC 6 Clicks Daily Activity   Time: 3976-7341 OT Time Calculation (min): 12 min  Charges: OT G-codes **NOT FOR INPATIENT CLASS** Functional Assessment Tool Used: AM-PAC 6 Clicks Daily Activity OT General Charges $OT Visit: 1 Visit OT Treatments $Therapeutic Activity: 8-22 mins     Britt Bottom 05/16/2018, 2:21 PM

## 2018-05-16 NOTE — Progress Notes (Signed)
Pt will DC to: [place] date: 05/16/18 Family notified:  Transport by: [facility] Please make sure signed prescriptions goes with the patient.    RN, patient, and facility notified of DC. Discharge Summary sent to facility. RN given number for report [facility] at [phone]. DC packet on chart. Ambulance transport requested for patient.   CSW signing off. Thurmond Butts, [place] Social Worker [phone]

## 2018-05-16 NOTE — Progress Notes (Signed)
Pt reported a L upper tooth fell out while eating lunch. No complaints at this time. Will continue to monitor.

## 2018-05-17 DIAGNOSIS — S82402A Unspecified fracture of shaft of left fibula, initial encounter for closed fracture: Secondary | ICD-10-CM | POA: Diagnosis not present

## 2018-05-17 DIAGNOSIS — S82492D Other fracture of shaft of left fibula, subsequent encounter for closed fracture with routine healing: Secondary | ICD-10-CM

## 2018-05-17 DIAGNOSIS — S82402S Unspecified fracture of shaft of left fibula, sequela: Secondary | ICD-10-CM | POA: Diagnosis not present

## 2018-05-17 DIAGNOSIS — R41841 Cognitive communication deficit: Secondary | ICD-10-CM | POA: Diagnosis not present

## 2018-05-17 MED ORDER — HYDROMORPHONE HCL 2 MG PO TABS: 1.0000 mg | ORAL_TABLET | ORAL | 0 refills | Status: AC | PRN

## 2018-05-17 MED ORDER — HYDROMORPHONE HCL 2 MG PO TABS: 1.0000 mg | ORAL_TABLET | ORAL | 0 refills | Status: DC | PRN

## 2018-05-17 NOTE — Plan of Care (Signed)
  Problem: Education: °Goal: Knowledge of General Education information will improve °Description: Including pain rating scale, medication(s)/side effects and non-pharmacologic comfort measures °Outcome: Progressing °  °Problem: Clinical Measurements: °Goal: Will remain free from infection °Outcome: Progressing °  °Problem: Activity: °Goal: Risk for activity intolerance will decrease °Outcome: Progressing °  °Problem: Elimination: °Goal: Will not experience complications related to bowel motility °Outcome: Progressing °  °Problem: Pain Managment: °Goal: General experience of comfort will improve °Outcome: Progressing °  °Problem: Safety: °Goal: Ability to remain free from injury will improve °Outcome: Progressing °  °

## 2018-05-17 NOTE — Progress Notes (Signed)
CSW talked with patient at bedside. Patient expressed that she was canceling her appeal today at 12 noon and she will be ready to discharge after 2pm to [facility]. CSW informed the patient that [facility] until today only. CSW called and informed [place] of patient's concern regarding receiving her pain medication timely. [place] explained the sooner she arrives at the facility the sooner they can begin the process for her medications.  CSW updated RNCM, Nurse and doctor of patient's decision.   Thurmond Butts, [place] Social Worker [phone]

## 2018-05-17 NOTE — Progress Notes (Signed)
Pt will DC to: [place] date: 05/17/2018 Family notified:  Transport by: [facility] Please make sure signed prescriptions goes with the patient.   RN, patient, and facility notified of DC. Discharge Summary sent to facility. RN given number for report to [facility] at [phone]. DC packet on chart. Ambulance transport requested for patient.   CSW signing off. Thurmond Butts, [place] Social Worker [phone]

## 2018-05-17 NOTE — Progress Notes (Signed)
Internal Medicine Attending:   I saw and examined the patient. I reviewed the resident's note and I agree with the resident's findings and plan as documented in the resident's note.  Patient states that she feels well today and has no new complaints.  She was concerned about the ability to receive her pain medications at the SNF but was reassured about this today.  Patient still complains of occasional left leg pain but her pain is well controlled on current regimen.  Patient was initially admitted for left ankle and fibular fracture.  She had a splint placed.  Case was discussed with orthopedics and she is scheduled to follow-up with them next week.  Patient is also noted to have some mild serosanguineous discharge from her old surgical site of her right hip in the setting of a history of chronic right hip osteomyelitis for which patient has been intermittently compliant with chronic amoxicillin.  This was discussed with orthopedics.  They want to follow-up with her as an outpatient. Her amoxicillin was continued on this admission.  She will follow-up with orthopedics next week.  Of note, patient had lucencies noted on imaging of her cervical spine concerning for possible metastatic disease in setting of a history of a breast mass which has not been worked up.  This is reviewed with radiology by the resident who states that these lesions appear unchanged from February.  Patient will need outpatient follow-up of breast mass with a mammogram and if suspicious for malignancy may need further work-up of cervical lucencies as well possibly with a PET scan.  No further work-up at this time.  Patient is stable for discharge to SNF today.  Plan was discussed with patient in detail and all questions were answered.  Patient is agreeable for discharge to SNF today.

## 2018-05-17 NOTE — Plan of Care (Signed)
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
## 2018-05-17 NOTE — Progress Notes (Signed)
Patient discharge today to [facility] Attempt x2 to give report was unsuccessful. Secretary stated nurse will call back Probation officer. Patient left via [facility]. C/O pain med x1 with prn pain pill. Took all personal belongings.

## 2018-05-17 NOTE — Progress Notes (Signed)
   Subjective: Mr. Reznick was doing good today. She agrees about discharging her to SNF today.   Objective:  Vital signs in last 24 hours: Vitals:   05/16/18 1438 05/16/18 1955 05/17/18 0410 05/17/18 0500  BP: (!) 153/71 137/73 (!) 158/93   Pulse: 84 73 74   Resp: 16 20 15    Temp: 97.6 F (36.4 C) 98.3 F (36.8 C) 97.7 F (36.5 C)   TempSrc: Oral Oral Oral   SpO2: 96% 97% 99%   Weight:    273 lb 2.4 oz (123.9 kg)  Height:       Ph/E: Alert and oriented lady. Lying on the bed with no acute distress. CV: RRR, No murmur Lungs: CTA bilaterally. Abdomen: Soft and nontender Extremities: No edema. Left leg with splint and ace wrap. Skin: Rt post hip has a small dehiscence with small bloody discharge  Assessment/Plan: Patient is stable from medical stand point and is discharged to [facility] facility and follow up as schedule outpatient.   Ankle and fibular fx: Splinted by ortho. Will discharge to SNF today with pain medicine and follow up next week with Dr. Marlou Sa as scheduled.   SLE: Currently asymptomatic -Continue Hydroxychloroquine and Prednison 5 mg 4 times a day  Hx of Breast mass. Patient has not followed up for her mass so far since she has had a lot to do about her hip.  -Follow up with breast center for scheduling mammography  current cervical spine lucent lesions Reviewed CT scan with radiologist. Current lesions are similar to finding in previous scan. Radiologist, did not recommend further work up on the lesion but approach to breast mass fisrt and then decide accordingly. If malignancy in breast suspected, PET scan for bone lesion may be helpful.    Hx of DVT, PE s/p IVC filter and stent: -Continue ASA 81 mg   Hip OA, AVN: + small dehiscence and scant watery-blood discharge.   -Dr. Marlou Sa consulted. Plan to have her follow up next week. -Continue Amoxicilin 500 mg TID -Follow up with orthopedic  Dispo: Discharge today  Dewayne Hatch, MD 05/17/2018, 1:45 PM Pager: [phone]

## 2018-05-17 NOTE — Care Management (Signed)
Patient is canceling her appeal with [employer]. She says her concern was that she would not receive her pain medications at the SNF. Social worker contacted [facility] [employer]  And expressed patient's concerns, they have patient's medications and she will need to be there before evening. CM relayed this information to patient, social worker relayed this information to patient.

## 2018-05-18 ENCOUNTER — Other Ambulatory Visit: Payer: Medicare HMO

## 2018-05-18 ENCOUNTER — Other Ambulatory Visit: Payer: Self-pay | Admitting: Internal Medicine

## 2018-05-18 ENCOUNTER — Inpatient Hospital Stay: Admission: RE | Admit: 2018-05-18 | Payer: Medicare HMO | Source: Ambulatory Visit

## 2018-05-18 ENCOUNTER — Encounter: Payer: Medicare HMO | Admitting: Internal Medicine

## 2018-05-18 DIAGNOSIS — S82402A Unspecified fracture of shaft of left fibula, initial encounter for closed fracture: Secondary | ICD-10-CM | POA: Diagnosis not present

## 2018-05-18 DIAGNOSIS — R41841 Cognitive communication deficit: Secondary | ICD-10-CM | POA: Diagnosis not present

## 2018-05-18 DIAGNOSIS — N6310 Unspecified lump in the right breast, unspecified quadrant: Secondary | ICD-10-CM

## 2018-05-18 DIAGNOSIS — S82402S Unspecified fracture of shaft of left fibula, sequela: Secondary | ICD-10-CM | POA: Diagnosis not present

## 2018-05-20 DIAGNOSIS — R41841 Cognitive communication deficit: Secondary | ICD-10-CM | POA: Diagnosis not present

## 2018-05-20 DIAGNOSIS — S82402S Unspecified fracture of shaft of left fibula, sequela: Secondary | ICD-10-CM | POA: Diagnosis not present

## 2018-05-20 DIAGNOSIS — S82402A Unspecified fracture of shaft of left fibula, initial encounter for closed fracture: Secondary | ICD-10-CM | POA: Diagnosis not present

## 2018-05-21 DIAGNOSIS — R41841 Cognitive communication deficit: Secondary | ICD-10-CM | POA: Diagnosis not present

## 2018-05-21 DIAGNOSIS — S82402A Unspecified fracture of shaft of left fibula, initial encounter for closed fracture: Secondary | ICD-10-CM | POA: Diagnosis not present

## 2018-05-21 DIAGNOSIS — S82402S Unspecified fracture of shaft of left fibula, sequela: Secondary | ICD-10-CM | POA: Diagnosis not present

## 2018-05-22 DIAGNOSIS — S82402A Unspecified fracture of shaft of left fibula, initial encounter for closed fracture: Secondary | ICD-10-CM | POA: Diagnosis not present

## 2018-05-22 DIAGNOSIS — S82402S Unspecified fracture of shaft of left fibula, sequela: Secondary | ICD-10-CM | POA: Diagnosis not present

## 2018-05-22 DIAGNOSIS — R41841 Cognitive communication deficit: Secondary | ICD-10-CM | POA: Diagnosis not present

## 2018-05-23 ENCOUNTER — Telehealth (INDEPENDENT_AMBULATORY_CARE_PROVIDER_SITE_OTHER): Payer: Self-pay | Admitting: Orthopedic Surgery

## 2018-05-23 ENCOUNTER — Ambulatory Visit (INDEPENDENT_AMBULATORY_CARE_PROVIDER_SITE_OTHER): Payer: Medicare HMO | Admitting: Orthopedic Surgery

## 2018-05-23 ENCOUNTER — Ambulatory Visit (INDEPENDENT_AMBULATORY_CARE_PROVIDER_SITE_OTHER): Payer: Medicare HMO

## 2018-05-23 ENCOUNTER — Encounter (INDEPENDENT_AMBULATORY_CARE_PROVIDER_SITE_OTHER): Payer: Self-pay | Admitting: Orthopedic Surgery

## 2018-05-23 DIAGNOSIS — M79605 Pain in left leg: Secondary | ICD-10-CM | POA: Diagnosis not present

## 2018-05-23 DIAGNOSIS — S82402S Unspecified fracture of shaft of left fibula, sequela: Secondary | ICD-10-CM | POA: Diagnosis not present

## 2018-05-23 DIAGNOSIS — M1611 Unilateral primary osteoarthritis, right hip: Secondary | ICD-10-CM | POA: Diagnosis not present

## 2018-05-23 DIAGNOSIS — S82402A Unspecified fracture of shaft of left fibula, initial encounter for closed fracture: Secondary | ICD-10-CM | POA: Diagnosis not present

## 2018-05-23 DIAGNOSIS — R69 Illness, unspecified: Secondary | ICD-10-CM | POA: Diagnosis not present

## 2018-05-23 DIAGNOSIS — G8929 Other chronic pain: Secondary | ICD-10-CM | POA: Diagnosis not present

## 2018-05-23 DIAGNOSIS — R41841 Cognitive communication deficit: Secondary | ICD-10-CM | POA: Diagnosis not present

## 2018-05-23 DIAGNOSIS — S2232XA Fracture of one rib, left side, initial encounter for closed fracture: Secondary | ICD-10-CM | POA: Diagnosis not present

## 2018-05-23 DIAGNOSIS — N6310 Unspecified lump in the right breast, unspecified quadrant: Secondary | ICD-10-CM | POA: Diagnosis not present

## 2018-05-23 NOTE — Telephone Encounter (Signed)
Patient said note needs to say bathroom not comode? Bed 102 Needs faxed to attn: Rosario Adie # [phone]

## 2018-05-23 NOTE — Telephone Encounter (Signed)
Corrected and faxed

## 2018-05-24 ENCOUNTER — Telehealth (INDEPENDENT_AMBULATORY_CARE_PROVIDER_SITE_OTHER): Payer: Self-pay | Admitting: Orthopedic Surgery

## 2018-05-24 ENCOUNTER — Other Ambulatory Visit (INDEPENDENT_AMBULATORY_CARE_PROVIDER_SITE_OTHER): Payer: Self-pay | Admitting: Orthopedic Surgery

## 2018-05-24 DIAGNOSIS — S82892A Other fracture of left lower leg, initial encounter for closed fracture: Secondary | ICD-10-CM

## 2018-05-24 NOTE — Telephone Encounter (Signed)
FYI

## 2018-05-24 NOTE — Telephone Encounter (Signed)
Patient's surgical case for LEFT ANKLE SYNDESMOTIC FIXATION is now scheduled at [facility] on Monday, July 29th @10am .

## 2018-05-25 ENCOUNTER — Encounter (HOSPITAL_COMMUNITY): Payer: Self-pay | Admitting: *Deleted

## 2018-05-25 ENCOUNTER — Encounter (INDEPENDENT_AMBULATORY_CARE_PROVIDER_SITE_OTHER): Payer: Self-pay | Admitting: Orthopedic Surgery

## 2018-05-25 ENCOUNTER — Other Ambulatory Visit: Payer: Self-pay

## 2018-05-25 MED ORDER — DEXTROSE 5 % IV SOLN
3.0000 g | INTRAVENOUS | Status: AC
  Administered 2018-05-28: 3 g via INTRAVENOUS
  Filled 2018-05-25: qty 3

## 2018-05-25 NOTE — Telephone Encounter (Signed)
thx

## 2018-05-25 NOTE — Progress Notes (Signed)
Pt denies SOB, chest pain, and being under the care of a cardiologist. Pt stated that a stress test was performed at [facility] in [place]; records requested. Pt stated that a cardiac cath was performed at [facility]; records requested. Pt denies having a chest x ray within the last year. Pt made aware to stop taking vitamins, fish oil and herbal medications. Do not take any NSAIDs ie: Ibuprofen, Advil, Naproxen (Aleve), Motrin, Voltaren, BC and Goody Powder. Pt verbalized understanding of all pre-op instructions. Anesthesia asked to review pt history.
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
## 2018-05-26 ENCOUNTER — Other Ambulatory Visit: Payer: Self-pay | Admitting: Internal Medicine

## 2018-05-27 ENCOUNTER — Other Ambulatory Visit: Payer: Self-pay | Admitting: Internal Medicine

## 2018-05-27 DIAGNOSIS — M329 Systemic lupus erythematosus, unspecified: Secondary | ICD-10-CM

## 2018-05-28 ENCOUNTER — Observation Stay (HOSPITAL_COMMUNITY): Payer: Medicare HMO

## 2018-05-28 ENCOUNTER — Observation Stay (HOSPITAL_COMMUNITY)
Admission: RE | Admit: 2018-05-28 | Discharge: 2018-05-29 | Disposition: A | Discharge status: Home / Self Care | Payer: Medicare HMO | Source: Ambulatory Visit | Attending: Orthopedic Surgery | Admitting: Orthopedic Surgery

## 2018-05-28 ENCOUNTER — Ambulatory Visit (HOSPITAL_COMMUNITY): Payer: Medicare HMO

## 2018-05-28 ENCOUNTER — Other Ambulatory Visit: Payer: Self-pay

## 2018-05-28 ENCOUNTER — Ambulatory Visit (HOSPITAL_COMMUNITY): Payer: Medicare HMO | Admitting: Physician Assistant

## 2018-05-28 ENCOUNTER — Encounter (HOSPITAL_COMMUNITY)
Admission: RE | Disposition: A | Discharge status: Home / Self Care | Payer: Self-pay | Source: Ambulatory Visit | Attending: Orthopedic Surgery

## 2018-05-28 ENCOUNTER — Ambulatory Visit: Payer: Medicare HMO

## 2018-05-28 ENCOUNTER — Encounter (HOSPITAL_COMMUNITY): Payer: Self-pay | Admitting: *Deleted

## 2018-05-28 DIAGNOSIS — S82892A Other fracture of left lower leg, initial encounter for closed fracture: Secondary | ICD-10-CM | POA: Diagnosis not present

## 2018-05-28 DIAGNOSIS — Z86711 Personal history of pulmonary embolism: Secondary | ICD-10-CM | POA: Diagnosis not present

## 2018-05-28 DIAGNOSIS — I739 Peripheral vascular disease, unspecified: Secondary | ICD-10-CM | POA: Insufficient documentation

## 2018-05-28 DIAGNOSIS — F4310 Post-traumatic stress disorder, unspecified: Secondary | ICD-10-CM | POA: Diagnosis not present

## 2018-05-28 DIAGNOSIS — K219 Gastro-esophageal reflux disease without esophagitis: Secondary | ICD-10-CM | POA: Insufficient documentation

## 2018-05-28 DIAGNOSIS — R69 Illness, unspecified: Secondary | ICD-10-CM | POA: Diagnosis not present

## 2018-05-28 DIAGNOSIS — X501XXA Overexertion from prolonged static or awkward postures, initial encounter: Secondary | ICD-10-CM | POA: Insufficient documentation

## 2018-05-28 DIAGNOSIS — Z7951 Long term (current) use of inhaled steroids: Secondary | ICD-10-CM | POA: Diagnosis not present

## 2018-05-28 DIAGNOSIS — Z6841 Body Mass Index (BMI) 40.0 and over, adult: Secondary | ICD-10-CM | POA: Diagnosis not present

## 2018-05-28 DIAGNOSIS — Z7952 Long term (current) use of systemic steroids: Secondary | ICD-10-CM | POA: Diagnosis not present

## 2018-05-28 DIAGNOSIS — S93439A Sprain of tibiofibular ligament of unspecified ankle, initial encounter: Principal | ICD-10-CM | POA: Insufficient documentation

## 2018-05-28 DIAGNOSIS — Z79899 Other long term (current) drug therapy: Secondary | ICD-10-CM | POA: Insufficient documentation

## 2018-05-28 DIAGNOSIS — I10 Essential (primary) hypertension: Secondary | ICD-10-CM | POA: Insufficient documentation

## 2018-05-28 DIAGNOSIS — D649 Anemia, unspecified: Secondary | ICD-10-CM | POA: Diagnosis not present

## 2018-05-28 DIAGNOSIS — Z7982 Long term (current) use of aspirin: Secondary | ICD-10-CM | POA: Insufficient documentation

## 2018-05-28 DIAGNOSIS — Z86718 Personal history of other venous thrombosis and embolism: Secondary | ICD-10-CM | POA: Diagnosis not present

## 2018-05-28 DIAGNOSIS — Z96641 Presence of right artificial hip joint: Secondary | ICD-10-CM | POA: Insufficient documentation

## 2018-05-28 DIAGNOSIS — Z419 Encounter for procedure for purposes other than remedying health state, unspecified: Secondary | ICD-10-CM

## 2018-05-28 DIAGNOSIS — M329 Systemic lupus erythematosus, unspecified: Secondary | ICD-10-CM | POA: Diagnosis not present

## 2018-05-28 DIAGNOSIS — Z79891 Long term (current) use of opiate analgesic: Secondary | ICD-10-CM | POA: Diagnosis not present

## 2018-05-28 DIAGNOSIS — S99912A Unspecified injury of left ankle, initial encounter: Secondary | ICD-10-CM | POA: Diagnosis not present

## 2018-05-28 DIAGNOSIS — S8292XA Unspecified fracture of left lower leg, initial encounter for closed fracture: Secondary | ICD-10-CM | POA: Diagnosis not present

## 2018-05-28 DIAGNOSIS — R569 Unspecified convulsions: Secondary | ICD-10-CM | POA: Insufficient documentation

## 2018-05-28 DIAGNOSIS — Z8542 Personal history of malignant neoplasm of other parts of uterus: Secondary | ICD-10-CM | POA: Diagnosis not present

## 2018-05-28 DIAGNOSIS — J45909 Unspecified asthma, uncomplicated: Secondary | ICD-10-CM | POA: Insufficient documentation

## 2018-05-28 DIAGNOSIS — Z87891 Personal history of nicotine dependence: Secondary | ICD-10-CM | POA: Insufficient documentation

## 2018-05-28 DIAGNOSIS — S82899A Other fracture of unspecified lower leg, initial encounter for closed fracture: Secondary | ICD-10-CM | POA: Diagnosis present

## 2018-05-28 HISTORY — PX: ORIF ANKLE FRACTURE: SHX5408

## 2018-05-28 HISTORY — DX: Sprain of tibiofibular ligament of left ankle, initial encounter: S93.432A

## 2018-05-28 HISTORY — PX: ANKLE FRACTURE SURGERY: SHX122

## 2018-05-28 LAB — SURGICAL PCR SCREEN
MRSA, PCR: NEGATIVE
Staphylococcus aureus: NEGATIVE

## 2018-05-28 SURGERY — OPEN REDUCTION INTERNAL FIXATION (ORIF) ANKLE FRACTURE
Anesthesia: General | Site: Ankle | Laterality: Left

## 2018-05-28 MED ORDER — SUGAMMADEX SODIUM 200 MG/2ML IV SOLN
INTRAVENOUS | Status: AC
  Filled 2018-05-28: qty 2

## 2018-05-28 MED ORDER — ALBUTEROL SULFATE (2.5 MG/3ML) 0.083% IN NEBU: 2.5000 mg | INHALATION_SOLUTION | Freq: Three times a day (TID) | RESPIRATORY_TRACT | Status: DC | PRN

## 2018-05-28 MED ORDER — PHENYLEPHRINE HCL 10 MG/ML IJ SOLN
INTRAMUSCULAR | Status: DC | PRN
  Administered 2018-05-28: 80 ug via INTRAVENOUS

## 2018-05-28 MED ORDER — BUPIVACAINE HCL (PF) 0.25 % IJ SOLN
INTRAMUSCULAR | Status: AC
  Filled 2018-05-28: qty 30

## 2018-05-28 MED ORDER — CEFAZOLIN SODIUM-DEXTROSE 2-4 GM/100ML-% IV SOLN
2.0000 g | Freq: Four times a day (QID) | INTRAVENOUS | Status: AC
  Administered 2018-05-28 – 2018-05-29 (×3): 2 g via INTRAVENOUS
  Filled 2018-05-28 (×4): qty 100

## 2018-05-28 MED ORDER — METOCLOPRAMIDE HCL 5 MG/ML IJ SOLN: 10.0000 mg | Freq: Once | INTRAMUSCULAR | Status: DC | PRN

## 2018-05-28 MED ORDER — PROPOFOL 10 MG/ML IV BOLUS
INTRAVENOUS | Status: DC | PRN
  Administered 2018-05-28: 150 mg via INTRAVENOUS

## 2018-05-28 MED ORDER — MONTELUKAST SODIUM 10 MG PO TABS
10.0000 mg | ORAL_TABLET | Freq: Every day | ORAL | Status: DC
  Administered 2018-05-28: 10 mg via ORAL
  Filled 2018-05-28: qty 1

## 2018-05-28 MED ORDER — PROPOFOL 10 MG/ML IV BOLUS
INTRAVENOUS | Status: AC
  Filled 2018-05-28: qty 40

## 2018-05-28 MED ORDER — FENTANYL CITRATE (PF) 250 MCG/5ML IJ SOLN
INTRAMUSCULAR | Status: AC
  Filled 2018-05-28: qty 5

## 2018-05-28 MED ORDER — ONDANSETRON HCL 4 MG/2ML IJ SOLN
INTRAMUSCULAR | Status: DC | PRN
  Administered 2018-05-28: 4 mg via INTRAVENOUS

## 2018-05-28 MED ORDER — ONDANSETRON HCL 4 MG/2ML IJ SOLN: 4.0000 mg | Freq: Four times a day (QID) | INTRAMUSCULAR | Status: DC | PRN

## 2018-05-28 MED ORDER — ROCURONIUM BROMIDE 10 MG/ML (PF) SYRINGE
PREFILLED_SYRINGE | INTRAVENOUS | Status: AC
  Filled 2018-05-28: qty 10

## 2018-05-28 MED ORDER — CHLORHEXIDINE GLUCONATE 4 % EX LIQD: 60.0000 mL | Freq: Once | CUTANEOUS | Status: DC

## 2018-05-28 MED ORDER — ASPIRIN EC 81 MG PO TBEC
81.0000 mg | DELAYED_RELEASE_TABLET | Freq: Every day | ORAL | Status: DC
  Administered 2018-05-28 – 2018-05-29 (×2): 81 mg via ORAL
  Filled 2018-05-28 (×2): qty 1

## 2018-05-28 MED ORDER — DIPHENHYDRAMINE HCL 25 MG PO CAPS
25.0000 mg | ORAL_CAPSULE | Freq: Three times a day (TID) | ORAL | Status: DC | PRN
  Administered 2018-05-28: 25 mg via ORAL
  Filled 2018-05-28 (×3): qty 1

## 2018-05-28 MED ORDER — CLONIDINE HCL (ANALGESIA) 100 MCG/ML EP SOLN
150.0000 ug | Freq: Once | EPIDURAL | Status: AC
  Administered 2018-05-28: 1 mL via INTRA_ARTICULAR
  Filled 2018-05-28: qty 10

## 2018-05-28 MED ORDER — HYDROXYCHLOROQUINE SULFATE 200 MG PO TABS
200.0000 mg | ORAL_TABLET | Freq: Two times a day (BID) | ORAL | Status: DC
  Administered 2018-05-28 – 2018-05-29 (×3): 200 mg via ORAL
  Filled 2018-05-28 (×3): qty 1

## 2018-05-28 MED ORDER — METOCLOPRAMIDE HCL 5 MG PO TABS: 5.0000 mg | ORAL_TABLET | Freq: Three times a day (TID) | ORAL | Status: DC | PRN

## 2018-05-28 MED ORDER — BUPIVACAINE HCL (PF) 0.5 % IJ SOLN
INTRAMUSCULAR | Status: AC
  Filled 2018-05-28: qty 30

## 2018-05-28 MED ORDER — DOCUSATE SODIUM 100 MG PO CAPS
100.0000 mg | ORAL_CAPSULE | Freq: Two times a day (BID) | ORAL | Status: DC
  Administered 2018-05-28 – 2018-05-29 (×3): 100 mg via ORAL
  Filled 2018-05-28 (×3): qty 1

## 2018-05-28 MED ORDER — MEPERIDINE HCL 50 MG/ML IJ SOLN: 6.2500 mg | INTRAMUSCULAR | Status: DC | PRN

## 2018-05-28 MED ORDER — ONDANSETRON HCL 4 MG/2ML IJ SOLN
INTRAMUSCULAR | Status: AC
Start: 2018-05-28 — End: ?
  Filled 2018-05-28: qty 2

## 2018-05-28 MED ORDER — BUPIVACAINE HCL (PF) 0.25 % IJ SOLN
INTRAMUSCULAR | Status: DC | PRN
  Administered 2018-05-28: 30 mL

## 2018-05-28 MED ORDER — ONDANSETRON HCL 4 MG PO TABS
4.0000 mg | ORAL_TABLET | Freq: Four times a day (QID) | ORAL | Status: DC | PRN
  Filled 2018-05-28: qty 1

## 2018-05-28 MED ORDER — MIDAZOLAM HCL 5 MG/5ML IJ SOLN
INTRAMUSCULAR | Status: DC | PRN
  Administered 2018-05-28: 2 mg via INTRAVENOUS

## 2018-05-28 MED ORDER — LIDOCAINE HCL (CARDIAC) PF 100 MG/5ML IV SOSY
PREFILLED_SYRINGE | INTRAVENOUS | Status: DC | PRN
  Administered 2018-05-28: 80 mg via INTRAVENOUS

## 2018-05-28 MED ORDER — LACTATED RINGERS IV SOLN
INTRAVENOUS | Status: DC
  Administered 2018-05-28: 09:00:00 via INTRAVENOUS

## 2018-05-28 MED ORDER — ONDANSETRON HCL 4 MG PO TABS: 4.0000 mg | ORAL_TABLET | Freq: Four times a day (QID) | ORAL | Status: DC | PRN

## 2018-05-28 MED ORDER — ROCURONIUM BROMIDE 100 MG/10ML IV SOLN
INTRAVENOUS | Status: DC | PRN
  Administered 2018-05-28: 5 mg via INTRAVENOUS

## 2018-05-28 MED ORDER — SUGAMMADEX SODIUM 500 MG/5ML IV SOLN
INTRAVENOUS | Status: AC
  Filled 2018-05-28: qty 5

## 2018-05-28 MED ORDER — FENTANYL CITRATE (PF) 100 MCG/2ML IJ SOLN
INTRAMUSCULAR | Status: DC | PRN
  Administered 2018-05-28 (×3): 50 ug via INTRAVENOUS

## 2018-05-28 MED ORDER — HYDROMORPHONE HCL 2 MG PO TABS
1.0000 mg | ORAL_TABLET | ORAL | Status: DC | PRN
Start: 2018-05-28 — End: 2018-05-29
  Administered 2018-05-28 – 2018-05-29 (×2): 1 mg via ORAL
  Filled 2018-05-28 (×2): qty 1

## 2018-05-28 MED ORDER — 0.9 % SODIUM CHLORIDE (POUR BTL) OPTIME
TOPICAL | Status: DC | PRN
  Administered 2018-05-28: 1000 mL

## 2018-05-28 MED ORDER — METOCLOPRAMIDE HCL 5 MG/ML IJ SOLN: 5.0000 mg | Freq: Three times a day (TID) | INTRAMUSCULAR | Status: DC | PRN

## 2018-05-28 MED ORDER — FLUTICASONE FUROATE-VILANTEROL 200-25 MCG/INH IN AEPB: 1.0000 | INHALATION_SPRAY | Freq: Every day | RESPIRATORY_TRACT | Status: DC | PRN

## 2018-05-28 MED ORDER — PREDNISONE 5 MG PO TABS
5.0000 mg | ORAL_TABLET | Freq: Four times a day (QID) | ORAL | Status: DC
  Administered 2018-05-28 – 2018-05-29 (×6): 5 mg via ORAL
  Filled 2018-05-28 (×6): qty 1

## 2018-05-28 MED ORDER — MIDAZOLAM HCL 2 MG/2ML IJ SOLN
INTRAMUSCULAR | Status: AC
  Filled 2018-05-28: qty 2

## 2018-05-28 MED ORDER — GABAPENTIN 400 MG PO CAPS
400.0000 mg | ORAL_CAPSULE | Freq: Three times a day (TID) | ORAL | Status: DC
  Administered 2018-05-28 – 2018-05-29 (×4): 400 mg via ORAL
  Filled 2018-05-28 (×4): qty 1

## 2018-05-28 MED ORDER — OXYCODONE HCL ER 15 MG PO T12A
30.0000 mg | EXTENDED_RELEASE_TABLET | Freq: Two times a day (BID) | ORAL | Status: DC
  Administered 2018-05-28 – 2018-05-29 (×3): 30 mg via ORAL
  Filled 2018-05-28 (×3): qty 2

## 2018-05-28 MED ORDER — FERROUS SULFATE 325 (65 FE) MG PO TABS
325.0000 mg | ORAL_TABLET | Freq: Every day | ORAL | Status: DC
  Administered 2018-05-28 – 2018-05-29 (×2): 325 mg via ORAL
  Filled 2018-05-28 (×2): qty 1

## 2018-05-28 MED ORDER — DULOXETINE HCL 30 MG PO CPEP
30.0000 mg | ORAL_CAPSULE | Freq: Every day | ORAL | Status: DC
  Administered 2018-05-28 – 2018-05-29 (×2): 30 mg via ORAL
  Filled 2018-05-28 (×2): qty 1

## 2018-05-28 MED ORDER — DICYCLOMINE HCL 20 MG PO TABS
20.0000 mg | ORAL_TABLET | Freq: Three times a day (TID) | ORAL | Status: DC
  Administered 2018-05-28 – 2018-05-29 (×4): 20 mg via ORAL
  Filled 2018-05-28 (×4): qty 1

## 2018-05-28 MED ORDER — AMLODIPINE BESYLATE 10 MG PO TABS
10.0000 mg | ORAL_TABLET | Freq: Every day | ORAL | Status: DC
  Filled 2018-05-28: qty 1

## 2018-05-28 MED ORDER — PANTOPRAZOLE SODIUM 40 MG PO TBEC
40.0000 mg | DELAYED_RELEASE_TABLET | Freq: Every day | ORAL | Status: DC
  Administered 2018-05-28 – 2018-05-29 (×2): 40 mg via ORAL
  Filled 2018-05-28 (×2): qty 1

## 2018-05-28 MED ORDER — SUGAMMADEX SODIUM 500 MG/5ML IV SOLN
INTRAVENOUS | Status: DC | PRN
  Administered 2018-05-28: 400 mg via INTRAVENOUS

## 2018-05-28 MED ORDER — FUROSEMIDE 40 MG PO TABS
40.0000 mg | ORAL_TABLET | Freq: Two times a day (BID) | ORAL | Status: DC
Start: 2018-05-28 — End: 2018-05-29
  Filled 2018-05-28 (×3): qty 1

## 2018-05-28 MED ORDER — LIDOCAINE 2% (20 MG/ML) 5 ML SYRINGE
INTRAMUSCULAR | Status: AC
  Filled 2018-05-28: qty 5

## 2018-05-28 MED ORDER — DEXAMETHASONE SODIUM PHOSPHATE 10 MG/ML IJ SOLN
INTRAMUSCULAR | Status: AC
  Filled 2018-05-28: qty 1

## 2018-05-28 MED ORDER — OXYCODONE HCL 5 MG PO TABS
20.0000 mg | ORAL_TABLET | Freq: Four times a day (QID) | ORAL | Status: DC
Start: 2018-05-28 — End: 2018-05-29
  Administered 2018-05-28 – 2018-05-29 (×5): 20 mg via ORAL
  Filled 2018-05-28 (×5): qty 4

## 2018-05-28 MED ORDER — FLUTICASONE FUROATE-VILANTEROL 200-25 MCG/INH IN AEPB
1.0000 | INHALATION_SPRAY | Freq: Every day | RESPIRATORY_TRACT | Status: DC
  Filled 2018-05-28: qty 28

## 2018-05-28 MED ORDER — MORPHINE SULFATE (PF) 4 MG/ML IV SOLN
INTRAVENOUS | Status: AC
  Filled 2018-05-28: qty 2

## 2018-05-28 MED ORDER — ZOLPIDEM TARTRATE 5 MG PO TABS: 10.0000 mg | ORAL_TABLET | Freq: Every evening | ORAL | Status: DC | PRN

## 2018-05-28 MED ORDER — CYCLOBENZAPRINE HCL 10 MG PO TABS
10.0000 mg | ORAL_TABLET | Freq: Three times a day (TID) | ORAL | Status: DC
  Administered 2018-05-28 – 2018-05-29 (×4): 10 mg via ORAL
  Filled 2018-05-28 (×4): qty 1

## 2018-05-28 MED ORDER — ZOLPIDEM TARTRATE 5 MG PO TABS
5.0000 mg | ORAL_TABLET | Freq: Every evening | ORAL | Status: DC | PRN
  Administered 2018-05-28: 5 mg via ORAL
  Filled 2018-05-28: qty 1

## 2018-05-28 MED ORDER — HYDROMORPHONE HCL 1 MG/ML IJ SOLN: 0.2500 mg | INTRAMUSCULAR | Status: DC | PRN

## 2018-05-28 SURGICAL SUPPLY — 65 items
BANDAGE ACE 4X5 VEL STRL LF (GAUZE/BANDAGES/DRESSINGS) ×2 IMPLANT
BIT DRILL 2.5 CANN STRL (BIT) ×4 IMPLANT
BLADE SURG 10 STRL SS (BLADE) IMPLANT
BNDG COHESIVE 6X5 TAN STRL LF (GAUZE/BANDAGES/DRESSINGS) IMPLANT
BNDG ELASTIC 6X10 VLCR STRL LF (GAUZE/BANDAGES/DRESSINGS) IMPLANT
BNDG ESMARK 4X9 LF (GAUZE/BANDAGES/DRESSINGS) ×2 IMPLANT
BNDG GAUZE ELAST 4 BULKY (GAUZE/BANDAGES/DRESSINGS) IMPLANT
COVER MAYO STAND STRL (DRAPES) IMPLANT
COVER SURGICAL LIGHT HANDLE (MISCELLANEOUS) ×2 IMPLANT
CUFF TOURNIQUET SINGLE 34IN LL (TOURNIQUET CUFF) IMPLANT
CUFF TOURNIQUET SINGLE 44IN (TOURNIQUET CUFF) IMPLANT
DRAPE C-ARM 42X72 X-RAY (DRAPES) ×2 IMPLANT
DRAPE INCISE IOBAN 66X45 STRL (DRAPES) ×2 IMPLANT
DRAPE SURG 17X23 STRL (DRAPES) ×2 IMPLANT
DRAPE U-SHAPE 47X51 STRL (DRAPES) ×2 IMPLANT
DRSG AQUACEL AG ADV 3.5X 4 (GAUZE/BANDAGES/DRESSINGS) ×2 IMPLANT
DRSG PAD ABDOMINAL 8X10 ST (GAUZE/BANDAGES/DRESSINGS) IMPLANT
DURAPREP 26ML APPLICATOR (WOUND CARE) ×2 IMPLANT
ELECT CAUTERY BLADE 6.4 (BLADE) ×2 IMPLANT
ELECT REM PT RETURN 9FT ADLT (ELECTROSURGICAL) ×2
ELECTRODE REM PT RTRN 9FT ADLT (ELECTROSURGICAL) ×1 IMPLANT
GAUZE SPONGE 4X4 12PLY STRL (GAUZE/BANDAGES/DRESSINGS) IMPLANT
GAUZE XEROFORM 5X9 LF (GAUZE/BANDAGES/DRESSINGS) IMPLANT
GLOVE BIOGEL PI IND STRL 6.5 (GLOVE) ×2 IMPLANT
GLOVE BIOGEL PI IND STRL 7.5 (GLOVE) ×1 IMPLANT
GLOVE BIOGEL PI IND STRL 8 (GLOVE) ×1 IMPLANT
GLOVE BIOGEL PI INDICATOR 6.5 (GLOVE) ×2
GLOVE BIOGEL PI INDICATOR 7.5 (GLOVE) ×1
GLOVE BIOGEL PI INDICATOR 8 (GLOVE) ×1
GLOVE ECLIPSE 7.0 STRL STRAW (GLOVE) IMPLANT
GLOVE SURG ORTHO 8.0 STRL STRW (GLOVE) IMPLANT
GLOVE SURG SS PI 6.0 STRL IVOR (GLOVE) ×4 IMPLANT
GOWN STRL REUS W/ TWL LRG LVL3 (GOWN DISPOSABLE) ×3 IMPLANT
GOWN STRL REUS W/TWL LRG LVL3 (GOWN DISPOSABLE) ×3
HANDPIECE INTERPULSE COAX TIP (DISPOSABLE)
K-WIRE THREADED TIP 1.35 (Wire) ×4 IMPLANT
KIT BASIN OR (CUSTOM PROCEDURE TRAY) ×2 IMPLANT
KIT TURNOVER KIT B (KITS) ×2 IMPLANT
KWIRE THREADED TIP 1.35 (Wire) ×2 IMPLANT
MANIFOLD NEPTUNE II (INSTRUMENTS) IMPLANT
NEEDLE HYPO 25GX1X1/2 BEV (NEEDLE) ×2 IMPLANT
NS IRRIG 1000ML POUR BTL (IV SOLUTION) ×2 IMPLANT
PACK ORTHO EXTREMITY (CUSTOM PROCEDURE TRAY) ×2 IMPLANT
PAD ARMBOARD 7.5X6 YLW CONV (MISCELLANEOUS) ×4 IMPLANT
PAD CAST 4YDX4 CTTN HI CHSV (CAST SUPPLIES) IMPLANT
PADDING CAST COTTON 4X4 STRL (CAST SUPPLIES)
PLATE 2 HOLE SS STRL (Plate) ×2 IMPLANT
SCREW CORTICAL 3.5X78MM LP TM (Screw) ×2 IMPLANT
SCREW LO PRF TMSS 3.5X55 CORT (Screw) ×2 IMPLANT
SCREW LO-PRO TM SS 3.5X54 (Screw) ×2 IMPLANT
SET HNDPC FAN SPRY TIP SCT (DISPOSABLE) IMPLANT
STOCKINETTE IMPERVIOUS 9X36 MD (GAUZE/BANDAGES/DRESSINGS) ×2 IMPLANT
SUCTION FRAZIER HANDLE 10FR (MISCELLANEOUS) ×1
SUCTION TUBE FRAZIER 10FR DISP (MISCELLANEOUS) ×1 IMPLANT
SUT ETHILON 3 0 PS 1 (SUTURE) ×2 IMPLANT
SUT MNCRL AB 3-0 PS2 18 (SUTURE) IMPLANT
SUT VIC AB 2-0 CTB1 (SUTURE) ×2 IMPLANT
SUT VIC AB 3-0 SH 27 (SUTURE) ×1
SUT VIC AB 3-0 SH 27X BRD (SUTURE) ×1 IMPLANT
SYR CONTROL 10ML LL (SYRINGE) ×2 IMPLANT
TOWEL OR 17X24 6PK STRL BLUE (TOWEL DISPOSABLE) IMPLANT
TOWEL OR 17X26 10 PK STRL BLUE (TOWEL DISPOSABLE) ×2 IMPLANT
TUBE CONNECTING 12X1/4 (SUCTIONS) ×2 IMPLANT
WATER STERILE IRR 1000ML POUR (IV SOLUTION) IMPLANT
YANKAUER SUCT BULB TIP NO VENT (SUCTIONS) IMPLANT
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
## 2018-05-28 NOTE — Transfer of Care (Signed)
Immediate Anesthesia Transfer of Care Note  Patient: Katherine Banks  Procedure(s) Performed: LEFT ANKLE SYNDESMOTIC FIXATION (Left Ankle)  Patient Location: PACU  Anesthesia Type:General  Level of Consciousness: awake, alert , oriented and sedated  Airway & Oxygen Therapy: Patient Spontanous Breathing, Patient connected to nasal cannula oxygen and Patient connected to face mask oxygen  Post-op Assessment: Report given to RN, Post -op Vital signs reviewed and stable and Patient moving all extremities  Post vital signs: Reviewed and stable  Last Vitals:  Vitals Value Taken Time  BP 156/86 05/28/2018 11:49 AM  Temp    Pulse 83 05/28/2018 11:53 AM  Resp 12 05/28/2018 11:53 AM  SpO2 95 % 05/28/2018 11:53 AM  Vitals shown include unvalidated device data.  Last Pain:  Vitals:   05/28/18 0806  TempSrc:   PainSc: 8          Complications: No apparent anesthesia complications
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
## 2018-05-28 NOTE — Op Note (Signed)
NAME: [name], [name] MEDICAL RECORD KT:[id number] ACCOUNT [id number] DATE OF BIRTH:12-Sep-1966 FACILITY: [facility] LOCATION: [facility] PHYSICIAN:GREGORY Randel Pigg, MD  OPERATIVE REPORT  DATE OF PROCEDURE:  05/28/2018  PREOPERATIVE DIAGNOSIS:  Left ankle syndesmotic injury.  POSTOPERATIVE DIAGNOSIS:  Left ankle syndesmotic injury.  PROCEDURE:  Left ankle syndesmotic fixation.  SURGEON ATTENDING:   Alphonzo Severance, MD  ASSISTANT:  None.  ANESTHESIA:  General.  IMPLANTS UTILIZED:   Two hole Arthrex plate with 2 bicortical syndesmotic screws placed.  INDICATIONS:  Katherine Banks is a patient with left ankle injury.  She is weightbearing on the left side due to right hip surgeries.  She presents now for operative management of that left ankle injury.  The patient understands the risks and benefits.  PROCEDURE IN DETAIL:  The patient was brought to the operating room where general anesthetic was induced.  Preoperative antibiotics administered.  Timeout was called.  Left leg was prescrubbed with alcohol and Betadine, allowed to air dry, prepped with  DuraPrep solution and draped in a sterile manner.  Charlie Pitter was then used to cover the operative field.  Timeout was called.  Ankle Esmarch utilized approximately 20 minutes.  Incision made over the lateral aspect of the fibula.  Skin and subcutaneous  tissue were sharply divided.  Under fluoroscopic guidance, the correct location of that inferior screw, slightly less than 2 cm above the joint line was made.   Two bicortical screws were then placed with a good reduction of the syndesmosis.   Preoperative instability definitely improved postoperatively.  The syndesmosis was symmetric.  PLAN:  The incision was thoroughly irrigated.  Skin edges anesthetized using Marcaine and clonidine.  Tourniquet released.  Bleeding points encountered, controlled using electrocautery.  Thorough irrigation again performed and the incision was closed  using 2-0 Vicryl, 3-0 Vicryl  and 3-0 nylon.  Aquacel dressing placed.  The Ace wrap placed back with a fracture boot.    The patient tolerated the procedure well, without immediate complications, transferred to the recovery room in stable condition.  AN/NUANCE  D:05/28/2018 T:05/28/2018 JOB:[id number]

## 2018-05-28 NOTE — Brief Op Note (Signed)
05/28/2018  11:36 AM  PATIENT:  Katherine Banks  52 y.o. female  PRE-OPERATIVE DIAGNOSIS:  LEFT ANKLE SYNDESMOSIS DISRUPTION  POST-OPERATIVE DIAGNOSIS:  LEFT ANKLE SYNDESMOSIS DISRUPTION  PROCEDURE:  Procedure(s): LEFT ANKLE SYNDESMOTIC FIXATION  SURGEON:  Surgeon(s): Meredith Pel, MD  ASSISTANT: none  ANESTHESIA:   general  EBL: 5 ml    No intake/output data recorded.  BLOOD ADMINISTERED: none  DRAINS: none   LOCAL MEDICATIONS USED: marcaine clonidine  SPECIMEN:  No Specimen  COUNTS:  YES  TOURNIQUET:  * Missing tourniquet times found for documented tourniquets in log: [id number] *  DICTATION: .Other Dictation: Dictation Number [id number]  PLAN OF CARE: Admit for overnight observation  PATIENT DISPOSITION:  PACU - hemodynamically stable

## 2018-05-28 NOTE — Anesthesia Procedure Notes (Signed)
Procedure Name: Intubation Date/Time: 05/28/2018 10:30 AM Performed by: Scheryl Darter, CRNA Pre-anesthesia Checklist: Patient identified, Emergency Drugs available, Suction available and Patient being monitored Patient Re-evaluated:Patient Re-evaluated prior to induction Oxygen Delivery Method: Circle System Utilized Preoxygenation: Pre-oxygenation with 100% oxygen Induction Type: IV induction Ventilation: Mask ventilation without difficulty Laryngoscope Size: Miller and 2 Grade View: Grade I Tube type: Oral Tube size: 7.0 mm Number of attempts: 1 Airway Equipment and Method: Stylet and Oral airway Placement Confirmation: ETT inserted through vocal cords under direct vision,  positive ETCO2 and breath sounds checked- equal and bilateral Tube secured with: Tape Dental Injury: Teeth and Oropharynx as per pre-operative assessment

## 2018-05-28 NOTE — Anesthesia Postprocedure Evaluation (Signed)
Anesthesia Post Note  Patient: ILIZA BLANKENBECKLER  Procedure(s) Performed: LEFT ANKLE SYNDESMOTIC FIXATION (Left Ankle)     Patient location during evaluation: PACU Anesthesia Type: General Level of consciousness: awake and alert Pain management: pain level controlled Vital Signs Assessment: post-procedure vital signs reviewed and stable Respiratory status: spontaneous breathing, nonlabored ventilation, respiratory function stable and patient connected to nasal cannula oxygen Cardiovascular status: blood pressure returned to baseline and stable Postop Assessment: no apparent nausea or vomiting Anesthetic complications: no    Last Vitals:  Vitals:   05/28/18 1249 05/28/18 1250  BP: 138/77   Pulse: 77 77  Resp: 14 15  Temp:    SpO2: 94% 98%    Last Pain:  Vitals:   05/28/18 1250  TempSrc:   PainSc: 2                  Brittanyann Wittner A.

## 2018-05-28 NOTE — Progress Notes (Signed)
IV team RN paged to update on pt location [id number] - now avail port dressing change, waiting on call back

## 2018-05-28 NOTE — Anesthesia Preprocedure Evaluation (Signed)
Anesthesia Evaluation  Patient identified by MRN, date of birth, ID band Patient awake    Reviewed: Allergy & Precautions, NPO status , Patient's Chart, lab work & pertinent test results  History of Anesthesia Complications (+) history of anesthetic complications  Airway Mallampati: II  TM Distance: >3 FB Neck ROM: Full    Dental  (+) Teeth Intact   Pulmonary asthma , pneumonia, former smoker,    breath sounds clear to auscultation       Cardiovascular hypertension, Pt. on medications + Peripheral Vascular Disease   Rhythm:Regular Rate:Normal  Hx/o PTE   Neuro/Psych  Headaches, Seizures -, Well Controlled,  PSYCHIATRIC DISORDERS Anxiety  Neuromuscular disease    GI/Hepatic Neg liver ROS, GERD  Medicated and Controlled,  Endo/Other  Morbid obesity  Renal/GU negative Renal ROS  negative genitourinary   Musculoskeletal  (+) Arthritis , Osteoarthritis,  Left ankle syndesmotic disruption S/P Left ankle Fx Metastatic disease C/Spine   Abdominal (+) + obese,   Peds  Hematology  (+) anemia , Hx/o SLE   Anesthesia Other Findings   Reproductive/Obstetrics                             Anesthesia Physical  Anesthesia Plan  ASA: III  Anesthesia Plan: General   Post-op Pain Management:    Induction: Intravenous  PONV Risk Score and Plan: 3 and Midazolam, Dexamethasone, Ondansetron and Treatment may vary due to age or medical condition  Airway Management Planned: LMA and Oral ETT  Additional Equipment:   Intra-op Plan:   Post-operative Plan: Extubation in OR  Informed Consent: I have reviewed the patients History and Physical, chart, labs and discussed the procedure including the risks, benefits and alternatives for the proposed anesthesia with the patient or authorized representative who has indicated his/her understanding and acceptance.   Dental advisory given  Plan Discussed with:  CRNA and Surgeon  Anesthesia Plan Comments:         Anesthesia Quick Evaluation

## 2018-05-28 NOTE — Progress Notes (Signed)
Pt having multiple falls at home, and now has broken ankle x2 due to bathroom not being handicap accessible. Pt stated "I have to shimmy in the bathroom, I can't move around". RN has left sticky notes for CSW, CM, and MD inquiring for assistance to help patient with this situation.

## 2018-05-29 ENCOUNTER — Encounter (HOSPITAL_COMMUNITY): Payer: Self-pay | Admitting: Orthopedic Surgery

## 2018-05-29 DIAGNOSIS — M2550 Pain in unspecified joint: Secondary | ICD-10-CM | POA: Diagnosis not present

## 2018-05-29 DIAGNOSIS — S82892A Other fracture of left lower leg, initial encounter for closed fracture: Secondary | ICD-10-CM | POA: Diagnosis not present

## 2018-05-29 DIAGNOSIS — Z7401 Bed confinement status: Secondary | ICD-10-CM | POA: Diagnosis not present

## 2018-05-29 DIAGNOSIS — S93439A Sprain of tibiofibular ligament of unspecified ankle, initial encounter: Secondary | ICD-10-CM | POA: Diagnosis not present

## 2018-05-29 MED ORDER — HEPARIN SOD (PORK) LOCK FLUSH 100 UNIT/ML IV SOLN: 500.0000 [IU] | INTRAVENOUS | Status: DC | PRN

## 2018-05-29 NOTE — Progress Notes (Signed)
Pt left with EMS to home.  Pt verbalizes understanding on discharge instructions.

## 2018-05-29 NOTE — Plan of Care (Signed)
  Problem: Pain Managment: Goal: General experience of comfort will improve Outcome: Progressing   Problem: Safety: Goal: Ability to remain free from injury will improve Outcome: Progressing   

## 2018-05-29 NOTE — Progress Notes (Signed)
   05/29/18 1300  Clinical Encounter Type  Visited With Patient  Visit Type Follow-up  Referral From Nurse  Consult/Referral To Chaplain  Spiritual Encounters  Spiritual Needs Emotional;Prayer  Stress Factors  Patient Stress Factors Family relationships  Chaplain visited with the PT and had a discussion concerning her life, surgeries, faith and plan of care.  The PT is strong in her faith and makes bold confessions of such.  Her family is not on the same faith scale and sometimes this leads to bouts of being the "black sheep" in the family.  The PT has a positive attitude and we prayed concerning grace over her life.

## 2018-05-29 NOTE — Progress Notes (Signed)
Patient stable Left leg dorsiflexion plantarflexion intact at the toes Plan discharged today. Patient is on substantial pain medications and does not need anything new in that regard She will follow-up in 10 days
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
## 2018-05-29 NOTE — Progress Notes (Signed)
Pt alert and oriented x4, no complaints of pain or discomfort.  Bed in low position, call bell within reach.  Bed alarms on and functioning.  Assessment done and charted.  Will continue to monitor and do hourly rounding throughout the shift 

## 2018-05-31 ENCOUNTER — Other Ambulatory Visit: Payer: Medicare HMO

## 2018-05-31 ENCOUNTER — Telehealth: Payer: Self-pay | Admitting: Internal Medicine

## 2018-06-07 ENCOUNTER — Inpatient Hospital Stay (INDEPENDENT_AMBULATORY_CARE_PROVIDER_SITE_OTHER): Payer: Medicare HMO | Admitting: Physician Assistant

## 2018-06-08 ENCOUNTER — Telehealth (INDEPENDENT_AMBULATORY_CARE_PROVIDER_SITE_OTHER): Payer: Self-pay | Admitting: Orthopedic Surgery

## 2018-06-08 DIAGNOSIS — I10 Essential (primary) hypertension: Secondary | ICD-10-CM | POA: Diagnosis not present

## 2018-06-08 DIAGNOSIS — S93492D Sprain of other ligament of left ankle, subsequent encounter: Secondary | ICD-10-CM | POA: Diagnosis not present

## 2018-06-08 DIAGNOSIS — M179 Osteoarthritis of knee, unspecified: Secondary | ICD-10-CM | POA: Diagnosis not present

## 2018-06-08 DIAGNOSIS — M47816 Spondylosis without myelopathy or radiculopathy, lumbar region: Secondary | ICD-10-CM | POA: Diagnosis not present

## 2018-06-08 DIAGNOSIS — M329 Systemic lupus erythematosus, unspecified: Secondary | ICD-10-CM | POA: Diagnosis not present

## 2018-06-08 DIAGNOSIS — S8265XD Nondisplaced fracture of lateral malleolus of left fibula, subsequent encounter for closed fracture with routine healing: Secondary | ICD-10-CM | POA: Diagnosis not present

## 2018-06-08 DIAGNOSIS — S82892D Other fracture of left lower leg, subsequent encounter for closed fracture with routine healing: Secondary | ICD-10-CM | POA: Diagnosis not present

## 2018-06-08 DIAGNOSIS — I739 Peripheral vascular disease, unspecified: Secondary | ICD-10-CM | POA: Diagnosis not present

## 2018-06-08 DIAGNOSIS — M19019 Primary osteoarthritis, unspecified shoulder: Secondary | ICD-10-CM | POA: Diagnosis not present

## 2018-06-08 DIAGNOSIS — J45909 Unspecified asthma, uncomplicated: Secondary | ICD-10-CM | POA: Diagnosis not present

## 2018-06-08 NOTE — Telephone Encounter (Signed)
Frankie/PT/[facility] called and stated a request for orders for the following: 1. Patient is not established w/PCP 2. Medical Social Worker 3. OT to address ADL care 4. Order for PT 1x week 1 week 2x week for 3 weeks  In event she dosen't answer it's ok to leave a message.

## 2018-06-08 NOTE — Telephone Encounter (Signed)
IC verbal given for orders.

## 2018-06-11 ENCOUNTER — Ambulatory Visit (INDEPENDENT_AMBULATORY_CARE_PROVIDER_SITE_OTHER): Payer: Medicare HMO | Admitting: Physician Assistant

## 2018-06-11 ENCOUNTER — Encounter (INDEPENDENT_AMBULATORY_CARE_PROVIDER_SITE_OTHER): Payer: Self-pay | Admitting: Physician Assistant

## 2018-06-11 ENCOUNTER — Telehealth: Payer: Self-pay | Admitting: Internal Medicine

## 2018-06-11 ENCOUNTER — Ambulatory Visit (INDEPENDENT_AMBULATORY_CARE_PROVIDER_SITE_OTHER): Payer: Medicare HMO

## 2018-06-11 DIAGNOSIS — S82892A Other fracture of left lower leg, initial encounter for closed fracture: Secondary | ICD-10-CM

## 2018-06-11 NOTE — Progress Notes (Signed)
HPI: Mrs. Buick comes in today status post left ankle syndesmotic fixation 05/28/2018.  She is overall doing well.  Patient Dr. Randel Pigg.  Having some pain in her calf she does have a [place] fracture.  Denies any chest pain shortness of breath fevers chills.  She is weightbearing as tolerated in cam walker boot.  Physical exam: General well-developed well-nourished female no acute distress mood and affect appropriate. Left lower leg calf supple tenderness proximal fibula.  Dorsal pedal pulse intact.  Sensation grossly intact throughout the foot.  Surgical incisions well approximated with interrupted nylon sutures no signs of infection or dehiscence.  Radiographs: 2 view right ankle talus well located within the ankle mortise no diastases.  No hardware failure.  Impression: 2 weeks status post right ankle syndesmotic fixation.  Plan: She is weightbearing as tolerated in cam walker boot.  She can come out of the cam walker boot for hygiene gentle range of motion the ankle.  Sutures removed Steri-Strips applied.  Elevation wiggling toes encouraged.  Follow-up with Dr. Marlou Sa in 2 weeks sooner if there is any questions or concerns.

## 2018-06-12 DIAGNOSIS — S82892D Other fracture of left lower leg, subsequent encounter for closed fracture with routine healing: Secondary | ICD-10-CM | POA: Diagnosis not present

## 2018-06-12 DIAGNOSIS — M179 Osteoarthritis of knee, unspecified: Secondary | ICD-10-CM | POA: Diagnosis not present

## 2018-06-12 DIAGNOSIS — M19019 Primary osteoarthritis, unspecified shoulder: Secondary | ICD-10-CM | POA: Diagnosis not present

## 2018-06-12 DIAGNOSIS — I10 Essential (primary) hypertension: Secondary | ICD-10-CM | POA: Diagnosis not present

## 2018-06-12 DIAGNOSIS — M329 Systemic lupus erythematosus, unspecified: Secondary | ICD-10-CM | POA: Diagnosis not present

## 2018-06-12 DIAGNOSIS — S8265XD Nondisplaced fracture of lateral malleolus of left fibula, subsequent encounter for closed fracture with routine healing: Secondary | ICD-10-CM | POA: Diagnosis not present

## 2018-06-12 DIAGNOSIS — J45909 Unspecified asthma, uncomplicated: Secondary | ICD-10-CM | POA: Diagnosis not present

## 2018-06-12 DIAGNOSIS — I739 Peripheral vascular disease, unspecified: Secondary | ICD-10-CM | POA: Diagnosis not present

## 2018-06-12 DIAGNOSIS — S93492D Sprain of other ligament of left ankle, subsequent encounter: Secondary | ICD-10-CM | POA: Diagnosis not present

## 2018-06-12 DIAGNOSIS — M47816 Spondylosis without myelopathy or radiculopathy, lumbar region: Secondary | ICD-10-CM | POA: Diagnosis not present

## 2018-06-13 ENCOUNTER — Other Ambulatory Visit: Payer: Self-pay | Admitting: Internal Medicine

## 2018-06-13 DIAGNOSIS — J45909 Unspecified asthma, uncomplicated: Secondary | ICD-10-CM | POA: Diagnosis not present

## 2018-06-13 DIAGNOSIS — M329 Systemic lupus erythematosus, unspecified: Secondary | ICD-10-CM | POA: Diagnosis not present

## 2018-06-13 DIAGNOSIS — I739 Peripheral vascular disease, unspecified: Secondary | ICD-10-CM | POA: Diagnosis not present

## 2018-06-13 DIAGNOSIS — M19019 Primary osteoarthritis, unspecified shoulder: Secondary | ICD-10-CM | POA: Diagnosis not present

## 2018-06-13 DIAGNOSIS — M47816 Spondylosis without myelopathy or radiculopathy, lumbar region: Secondary | ICD-10-CM | POA: Diagnosis not present

## 2018-06-13 DIAGNOSIS — S93492D Sprain of other ligament of left ankle, subsequent encounter: Secondary | ICD-10-CM | POA: Diagnosis not present

## 2018-06-13 DIAGNOSIS — S8265XD Nondisplaced fracture of lateral malleolus of left fibula, subsequent encounter for closed fracture with routine healing: Secondary | ICD-10-CM | POA: Diagnosis not present

## 2018-06-13 DIAGNOSIS — S82892D Other fracture of left lower leg, subsequent encounter for closed fracture with routine healing: Secondary | ICD-10-CM | POA: Diagnosis not present

## 2018-06-13 DIAGNOSIS — M179 Osteoarthritis of knee, unspecified: Secondary | ICD-10-CM | POA: Diagnosis not present

## 2018-06-13 DIAGNOSIS — I10 Essential (primary) hypertension: Secondary | ICD-10-CM | POA: Diagnosis not present

## 2018-06-13 NOTE — Telephone Encounter (Signed)
Please schedule pt a f/u appt with Dr Maricela Bo. Thanks

## 2018-06-13 NOTE — Telephone Encounter (Signed)
Please schedule the patient for a follow up with me.

## 2018-06-15 DIAGNOSIS — M329 Systemic lupus erythematosus, unspecified: Secondary | ICD-10-CM | POA: Diagnosis not present

## 2018-06-15 DIAGNOSIS — S82892D Other fracture of left lower leg, subsequent encounter for closed fracture with routine healing: Secondary | ICD-10-CM | POA: Diagnosis not present

## 2018-06-15 DIAGNOSIS — I10 Essential (primary) hypertension: Secondary | ICD-10-CM | POA: Diagnosis not present

## 2018-06-15 DIAGNOSIS — M179 Osteoarthritis of knee, unspecified: Secondary | ICD-10-CM | POA: Diagnosis not present

## 2018-06-15 DIAGNOSIS — M19019 Primary osteoarthritis, unspecified shoulder: Secondary | ICD-10-CM | POA: Diagnosis not present

## 2018-06-15 DIAGNOSIS — S8265XD Nondisplaced fracture of lateral malleolus of left fibula, subsequent encounter for closed fracture with routine healing: Secondary | ICD-10-CM | POA: Diagnosis not present

## 2018-06-15 DIAGNOSIS — I739 Peripheral vascular disease, unspecified: Secondary | ICD-10-CM | POA: Diagnosis not present

## 2018-06-15 DIAGNOSIS — J45909 Unspecified asthma, uncomplicated: Secondary | ICD-10-CM | POA: Diagnosis not present

## 2018-06-15 DIAGNOSIS — S93492D Sprain of other ligament of left ankle, subsequent encounter: Secondary | ICD-10-CM | POA: Diagnosis not present

## 2018-06-15 DIAGNOSIS — M47816 Spondylosis without myelopathy or radiculopathy, lumbar region: Secondary | ICD-10-CM | POA: Diagnosis not present

## 2018-06-19 NOTE — Telephone Encounter (Signed)
Patient is already scheduled to see Dr. Maricela Bo on 06/29/18 at 2:45 pm.

## 2018-06-21 DIAGNOSIS — M19019 Primary osteoarthritis, unspecified shoulder: Secondary | ICD-10-CM | POA: Diagnosis not present

## 2018-06-21 DIAGNOSIS — M47816 Spondylosis without myelopathy or radiculopathy, lumbar region: Secondary | ICD-10-CM | POA: Diagnosis not present

## 2018-06-21 DIAGNOSIS — I739 Peripheral vascular disease, unspecified: Secondary | ICD-10-CM | POA: Diagnosis not present

## 2018-06-21 DIAGNOSIS — S8265XD Nondisplaced fracture of lateral malleolus of left fibula, subsequent encounter for closed fracture with routine healing: Secondary | ICD-10-CM | POA: Diagnosis not present

## 2018-06-21 DIAGNOSIS — M329 Systemic lupus erythematosus, unspecified: Secondary | ICD-10-CM | POA: Diagnosis not present

## 2018-06-21 DIAGNOSIS — M179 Osteoarthritis of knee, unspecified: Secondary | ICD-10-CM | POA: Diagnosis not present

## 2018-06-21 DIAGNOSIS — S93492D Sprain of other ligament of left ankle, subsequent encounter: Secondary | ICD-10-CM | POA: Diagnosis not present

## 2018-06-21 DIAGNOSIS — S82892D Other fracture of left lower leg, subsequent encounter for closed fracture with routine healing: Secondary | ICD-10-CM | POA: Diagnosis not present

## 2018-06-21 DIAGNOSIS — J45909 Unspecified asthma, uncomplicated: Secondary | ICD-10-CM | POA: Diagnosis not present

## 2018-06-21 DIAGNOSIS — I10 Essential (primary) hypertension: Secondary | ICD-10-CM | POA: Diagnosis not present

## 2018-06-25 DIAGNOSIS — S82892D Other fracture of left lower leg, subsequent encounter for closed fracture with routine healing: Secondary | ICD-10-CM | POA: Diagnosis not present

## 2018-06-25 DIAGNOSIS — I10 Essential (primary) hypertension: Secondary | ICD-10-CM | POA: Diagnosis not present

## 2018-06-25 DIAGNOSIS — C801 Malignant (primary) neoplasm, unspecified: Secondary | ICD-10-CM | POA: Diagnosis not present

## 2018-06-25 DIAGNOSIS — J45909 Unspecified asthma, uncomplicated: Secondary | ICD-10-CM | POA: Diagnosis not present

## 2018-06-25 DIAGNOSIS — I739 Peripheral vascular disease, unspecified: Secondary | ICD-10-CM | POA: Diagnosis not present

## 2018-06-25 DIAGNOSIS — S93492D Sprain of other ligament of left ankle, subsequent encounter: Secondary | ICD-10-CM | POA: Diagnosis not present

## 2018-06-25 DIAGNOSIS — M19019 Primary osteoarthritis, unspecified shoulder: Secondary | ICD-10-CM | POA: Diagnosis not present

## 2018-06-25 DIAGNOSIS — S8265XD Nondisplaced fracture of lateral malleolus of left fibula, subsequent encounter for closed fracture with routine healing: Secondary | ICD-10-CM | POA: Diagnosis not present

## 2018-06-25 DIAGNOSIS — M47816 Spondylosis without myelopathy or radiculopathy, lumbar region: Secondary | ICD-10-CM | POA: Diagnosis not present

## 2018-06-25 DIAGNOSIS — R52 Pain, unspecified: Secondary | ICD-10-CM | POA: Diagnosis not present

## 2018-06-25 DIAGNOSIS — M8668 Other chronic osteomyelitis, other site: Secondary | ICD-10-CM | POA: Diagnosis not present

## 2018-06-25 DIAGNOSIS — M329 Systemic lupus erythematosus, unspecified: Secondary | ICD-10-CM | POA: Diagnosis not present

## 2018-06-25 DIAGNOSIS — M179 Osteoarthritis of knee, unspecified: Secondary | ICD-10-CM | POA: Diagnosis not present

## 2018-06-25 NOTE — Telephone Encounter (Signed)
Transmission of this Rx failed on 06/13/2018. Called to [place] at [place], RN, BSN

## 2018-06-26 DIAGNOSIS — M19019 Primary osteoarthritis, unspecified shoulder: Secondary | ICD-10-CM | POA: Diagnosis not present

## 2018-06-26 DIAGNOSIS — I739 Peripheral vascular disease, unspecified: Secondary | ICD-10-CM | POA: Diagnosis not present

## 2018-06-26 DIAGNOSIS — S93492D Sprain of other ligament of left ankle, subsequent encounter: Secondary | ICD-10-CM | POA: Diagnosis not present

## 2018-06-26 DIAGNOSIS — M179 Osteoarthritis of knee, unspecified: Secondary | ICD-10-CM | POA: Diagnosis not present

## 2018-06-26 DIAGNOSIS — I10 Essential (primary) hypertension: Secondary | ICD-10-CM | POA: Diagnosis not present

## 2018-06-26 DIAGNOSIS — M47816 Spondylosis without myelopathy or radiculopathy, lumbar region: Secondary | ICD-10-CM | POA: Diagnosis not present

## 2018-06-26 DIAGNOSIS — J45909 Unspecified asthma, uncomplicated: Secondary | ICD-10-CM | POA: Diagnosis not present

## 2018-06-26 DIAGNOSIS — S8265XD Nondisplaced fracture of lateral malleolus of left fibula, subsequent encounter for closed fracture with routine healing: Secondary | ICD-10-CM | POA: Diagnosis not present

## 2018-06-26 DIAGNOSIS — S82892D Other fracture of left lower leg, subsequent encounter for closed fracture with routine healing: Secondary | ICD-10-CM | POA: Diagnosis not present

## 2018-06-26 DIAGNOSIS — M329 Systemic lupus erythematosus, unspecified: Secondary | ICD-10-CM | POA: Diagnosis not present

## 2018-06-26 NOTE — Discharge Summary (Signed)
Physician Discharge Summary  Patient ID: Katherine Banks MRN: [id number] DOB/AGE: [birth year] 52 y.o.  Admit date: 05/28/2018 Discharge date: 05/29/2018  Admission Diagnoses:  Active Problems:   Ankle fracture   Discharge Diagnoses:  Same  Surgeries: Procedure(s): LEFT ANKLE SYNDESMOTIC FIXATION on 05/28/2018   Consultants:   Discharged Condition: Stable  Hospital Course: Katherine Banks is an 53 y.o. female who was admitted 05/28/2018 with a chief complaint of left ankle pain, and found to have a diagnosis of left ankle syndesmotic injury.  They were brought to the operating room on 05/28/2018 and underwent the above named procedures.  Patient tolerated the procedure well without immediate complications.  She was discharged back to home the following day.  We will let her be touchdown weightbearing for transfers and I will see her back in clinic in approximately 10 days for suture removal.  She is discharged home in good condition.  Antibiotics given:  Anti-infectives (From admission, onward)   Start     Dose/Rate Route Frequency Ordered Stop   05/28/18 1600  ceFAZolin (ANCEF) IVPB 2g/100 mL premix     2 g 200 mL/hr over 30 Minutes Intravenous Every 6 hours 05/28/18 1338 05/29/18 0403   05/28/18 1400  hydroxychloroquine (PLAQUENIL) tablet 200 mg  Status:  Discontinued     200 mg Oral 2 times daily 05/28/18 1338 05/29/18 2239   05/28/18 0900  ceFAZolin (ANCEF) 3 g in dextrose 5 % 50 mL IVPB     3 g 100 mL/hr over 30 Minutes Intravenous To ShortStay Surgical 05/25/18 1012 05/28/18 1001    .  Recent vital signs:  Vitals:   05/29/18 0500 05/29/18 1937  BP: 140/74 139/88  Pulse: 72 86  Resp: 16 18  Temp: 98.1 F (36.7 C) 97.7 F (36.5 C)  SpO2: 94% 96%    Recent laboratory studies:  Results for orders placed or performed during the hospital encounter of 05/28/18  Surgical pcr screen  Result Value Ref Range   MRSA, PCR NEGATIVE NEGATIVE   Staphylococcus aureus  NEGATIVE NEGATIVE    Discharge Medications:   Allergies as of 05/29/2018      Reactions   Compazine [prochlorperazine Edisylate] Anaphylaxis, Swelling, Rash   Morphine And Related Anaphylaxis   Projectile vomiting and coughing also   Phenergan [promethazine Hcl] Anaphylaxis   Toradol [ketorolac Tromethamine] Anaphylaxis, Hives, Rash   Exforge Hct [amlodipine-valsartan-hctz] Nausea And Vomiting, Other (See Comments)   INTOLERANCE >  Thoughts were not settled, neurological changes    Influenza Vaccines Itching, Nausea And Vomiting, Swelling, Rash   SWOLLEN LIPS   Latex Rash   Tape Itching, Rash      Medication List    STOP taking these medications   dicyclomine 20 MG tablet Commonly known as:  BENTYL     TAKE these medications   albuterol (2.5 MG/3ML) 0.083% nebulizer solution Commonly known as:  PROVENTIL Take 3 mLs (2.5 mg total) by nebulization every 8 (eight) hours as needed for wheezing or shortness of breath.   amLODipine 10 MG tablet Commonly known as:  NORVASC TAKE 1 TABLET EVERY DAY   amoxicillin 500 MG capsule Commonly known as:  AMOXIL Take 1 capsule (500 mg total) by mouth every 8 (eight) hours.   aspirin EC 81 MG tablet Take 81 mg by mouth daily.   cyclobenzaprine 10 MG tablet Commonly known as:  FLEXERIL Take 10 mg by mouth 3 (three) times daily.   diclofenac sodium 1 % Gel Commonly known as:  VOLTAREN  Apply 2 g topically 4 (four) times daily as needed. What changed:  reasons to take this   diphenhydrAMINE 25 MG tablet Commonly known as:  BENADRYL Take 25 mg by mouth every 8 (eight) hours as needed for allergies.   ferrous sulfate 325 (65 FE) MG tablet Take 1 tablet (325 mg total) by mouth daily.   fluticasone 50 MCG/ACT nasal spray Commonly known as:  FLONASE Place 1 spray into both nostrils daily. What changed:    when to take this  reasons to take this   fluticasone furoate-vilanterol 200-25 MCG/INH Aepb Commonly known as:  BREO  ELLIPTA Inhale 1 puff into the lungs daily. What changed:    when to take this  reasons to take this   furosemide 40 MG tablet Commonly known as:  LASIX TAKE 1 TABLET BY MOUTH TWICE A DAY   gabapentin 400 MG capsule Commonly known as:  NEURONTIN Take 1 capsule (400 mg total) by mouth 3 (three) times daily.   HYDROmorphone 2 MG tablet Commonly known as:  DILAUDID Take 1 mg by mouth every 3 (three) hours as needed for severe pain.   hydroxychloroquine 200 MG tablet Commonly known as:  PLAQUENIL Take 200 mg by mouth 2 (two) times daily. What changed:  Another medication with the same name was added. Make sure you understand how and when to take each.   hydroxychloroquine 200 MG tablet Commonly known as:  PLAQUENIL TAKE 1 TABLET BY MOUTH TWICE A DAY What changed:  You were already taking a medication with the same name, and this prescription was added. Make sure you understand how and when to take each.   montelukast 10 MG tablet Commonly known as:  SINGULAIR Take 1 tablet (10 mg total) by mouth at bedtime. Reported on 03/14/2016   omeprazole 20 MG capsule Commonly known as:  PRILOSEC TAKE ONE CAPSULE BY MOUTH TWICE A DAY BEFORE A MEAL What changed:    how much to take  how to take this  when to take this  additional instructions   ondansetron 4 MG tablet Commonly known as:  ZOFRAN Take 1 tablet (4 mg total) by mouth every 6 (six) hours as needed for nausea.   oxyCODONE 30 MG 12 hr tablet Take 1 tablet (30 mg total) by mouth every 12 (twelve) hours.   Oxycodone HCl 20 MG Tabs Take 1 tablet (20 mg total) by mouth every 6 (six) hours.   predniSONE 5 MG tablet Commonly known as:  DELTASONE Take 5 mg by mouth 4 (four) times daily.   zolpidem 10 MG tablet Commonly known as:  AMBIEN Take 1 tablet (10 mg total) by mouth at bedtime as needed for sleep.       Diagnostic Studies: Dg Ankle 2 Views Left  Result Date: 05/28/2018 CLINICAL DATA:  52 year old female  with left ankle syndesmotic fixation. Subsequent encounter. EXAM: DG C-ARM 61-120 MIN; LEFT ANKLE - 2 VIEW Fluoroscopic time: 41 seconds. COMPARISON:  05/23/2018, 05/13/2018 and 12/21/2017. FINDINGS: Plate distal fibula with 2 screws transfixing the syndesmosis and anchored into the tibia. Narrowing posterior aspect of the tibiotalar joint space. Irregularity posterior aspect of the tibia or fibula on lateral view. IMPRESSION: Fixation distal left fibula-tibia. Electronically Signed   By: Genia Del M.D.   On: 05/28/2018 11:51   Dg Ankle Left Port  Result Date: 05/28/2018 CLINICAL DATA:  Status post ORIF of left ankle fracture EXAM: PORTABLE LEFT ANKLE - 2 VIEW COMPARISON:  Intraoperative films from earlier in the same  day. FINDINGS: Two fixation screws are noted traversing the fibula into the tibia. No acute abnormality is noted. IMPRESSION: Status post ORIF of left ankle fracture Electronically Signed   By: Inez Catalina M.D.   On: 05/28/2018 12:50   Dg C-arm 1-60 Min  Result Date: 05/28/2018 CLINICAL DATA:  52 year old female with left ankle syndesmotic fixation. Subsequent encounter. EXAM: DG C-ARM 61-120 MIN; LEFT ANKLE - 2 VIEW Fluoroscopic time: 41 seconds. COMPARISON:  05/23/2018, 05/13/2018 and 12/21/2017. FINDINGS: Plate distal fibula with 2 screws transfixing the syndesmosis and anchored into the tibia. Narrowing posterior aspect of the tibiotalar joint space. Irregularity posterior aspect of the tibia or fibula on lateral view. IMPRESSION: Fixation distal left fibula-tibia. Electronically Signed   By: Genia Del M.D.   On: 05/28/2018 11:51   Xr Ankle 2 Views Left  Result Date: 06/11/2018 Radiographs: 2 view right ankle talus well located within the ankle mortise no diastases.  No hardware failure.    Disposition:   Discharge Instructions    Call MD / Call 911   Complete by:  As directed    If you experience chest pain or shortness of breath, CALL 911 and be transported to the  hospital emergency room.  If you develope a fever above 101 F, pus (white drainage) or increased drainage or redness at the wound, or calf pain, call your surgeon's office.   Constipation Prevention   Complete by:  As directed    Drink plenty of fluids.  Prune juice may be helpful.  You may use a stool softener, such as Colace (over the counter) 100 mg twice a day.  Use MiraLax (over the counter) for constipation as needed.   Diet - low sodium heart healthy   Complete by:  As directed    Discharge instructions   Complete by:  As directed    Okay to weight-bear in the fracture boot Okay to sleep without the fracture boot Remove Ace wrap on Wednesday.  Okay to shower dressing underneath is waterproof Follow-up in 10 days for clinical recheck and suture removal   Increase activity slowly as tolerated   Complete by:  As directed          Signed: Anderson Malta 06/26/2018, 6:00 PM

## 2018-06-27 ENCOUNTER — Encounter (INDEPENDENT_AMBULATORY_CARE_PROVIDER_SITE_OTHER): Payer: Self-pay | Admitting: Orthopedic Surgery

## 2018-06-27 ENCOUNTER — Ambulatory Visit (INDEPENDENT_AMBULATORY_CARE_PROVIDER_SITE_OTHER): Payer: Medicare HMO | Admitting: Orthopedic Surgery

## 2018-06-27 DIAGNOSIS — S82892A Other fracture of left lower leg, initial encounter for closed fracture: Secondary | ICD-10-CM
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
## 2018-06-28 ENCOUNTER — Telehealth (INDEPENDENT_AMBULATORY_CARE_PROVIDER_SITE_OTHER): Payer: Self-pay | Admitting: Orthopedic Surgery

## 2018-06-28 DIAGNOSIS — S8265XD Nondisplaced fracture of lateral malleolus of left fibula, subsequent encounter for closed fracture with routine healing: Secondary | ICD-10-CM | POA: Diagnosis not present

## 2018-06-28 DIAGNOSIS — I739 Peripheral vascular disease, unspecified: Secondary | ICD-10-CM | POA: Diagnosis not present

## 2018-06-28 DIAGNOSIS — I10 Essential (primary) hypertension: Secondary | ICD-10-CM | POA: Diagnosis not present

## 2018-06-28 DIAGNOSIS — S82892D Other fracture of left lower leg, subsequent encounter for closed fracture with routine healing: Secondary | ICD-10-CM | POA: Diagnosis not present

## 2018-06-28 DIAGNOSIS — M329 Systemic lupus erythematosus, unspecified: Secondary | ICD-10-CM | POA: Diagnosis not present

## 2018-06-28 DIAGNOSIS — M19019 Primary osteoarthritis, unspecified shoulder: Secondary | ICD-10-CM | POA: Diagnosis not present

## 2018-06-28 DIAGNOSIS — M179 Osteoarthritis of knee, unspecified: Secondary | ICD-10-CM | POA: Diagnosis not present

## 2018-06-28 DIAGNOSIS — M47816 Spondylosis without myelopathy or radiculopathy, lumbar region: Secondary | ICD-10-CM | POA: Diagnosis not present

## 2018-06-28 DIAGNOSIS — J45909 Unspecified asthma, uncomplicated: Secondary | ICD-10-CM | POA: Diagnosis not present

## 2018-06-28 DIAGNOSIS — S93492D Sprain of other ligament of left ankle, subsequent encounter: Secondary | ICD-10-CM | POA: Diagnosis not present

## 2018-06-28 NOTE — Telephone Encounter (Signed)
Please advise what work note should say?  Just NWB or more info?

## 2018-06-28 NOTE — Telephone Encounter (Signed)
Weightbearing as tolerated in fracture boot left leg only for transfers.

## 2018-06-28 NOTE — Telephone Encounter (Signed)
Patient called needing a work note saying non bearing activities.  Please call patient to advise. [phone]

## 2018-06-29 ENCOUNTER — Telehealth (INDEPENDENT_AMBULATORY_CARE_PROVIDER_SITE_OTHER): Payer: Self-pay | Admitting: Orthopedic Surgery

## 2018-06-29 ENCOUNTER — Encounter: Payer: Medicare HMO | Admitting: Internal Medicine

## 2018-06-29 ENCOUNTER — Encounter: Payer: Self-pay | Admitting: Internal Medicine

## 2018-06-29 NOTE — Telephone Encounter (Signed)
Correction cannot leave a message, mail box full, if patient calls pls tell her letter is at front for pickup

## 2018-06-29 NOTE — Telephone Encounter (Signed)
Letter done, IC patient and advised. LMVM

## 2018-06-29 NOTE — Telephone Encounter (Signed)
Patient called asked if letter can be mailed to her. Patient said she cannot pick up letter because she do not drive. The number to contact patient is [phone]

## 2018-07-03 ENCOUNTER — Telehealth (INDEPENDENT_AMBULATORY_CARE_PROVIDER_SITE_OTHER): Payer: Self-pay

## 2018-07-03 ENCOUNTER — Telehealth (INDEPENDENT_AMBULATORY_CARE_PROVIDER_SITE_OTHER): Payer: Self-pay | Admitting: Orthopedic Surgery

## 2018-07-03 NOTE — Telephone Encounter (Signed)
[name] with [facility] called needing verbal orders for HHOT 2 wk 3 and 1 wk 3. The number to contact Barnetta Chapel is [phone]   IC back and verbal was given.

## 2018-07-03 NOTE — Telephone Encounter (Signed)
[name] with [facility] called needing verbal orders for HHOT 2 wk 3 and 1 wk 3. The number to contact Santina Evans is [phone]

## 2018-07-03 NOTE — Telephone Encounter (Signed)
Seems patient has duplicate chart. Will discuss with Tammy to see if chart can be merged.

## 2018-07-04 NOTE — Telephone Encounter (Signed)
Put ticket in for IT to see if they could merge charts for patient.

## 2018-07-06 DIAGNOSIS — M47816 Spondylosis without myelopathy or radiculopathy, lumbar region: Secondary | ICD-10-CM | POA: Diagnosis not present

## 2018-07-06 DIAGNOSIS — I739 Peripheral vascular disease, unspecified: Secondary | ICD-10-CM | POA: Diagnosis not present

## 2018-07-06 DIAGNOSIS — M179 Osteoarthritis of knee, unspecified: Secondary | ICD-10-CM | POA: Diagnosis not present

## 2018-07-06 DIAGNOSIS — S93492D Sprain of other ligament of left ankle, subsequent encounter: Secondary | ICD-10-CM | POA: Diagnosis not present

## 2018-07-06 DIAGNOSIS — M19019 Primary osteoarthritis, unspecified shoulder: Secondary | ICD-10-CM | POA: Diagnosis not present

## 2018-07-06 DIAGNOSIS — J45909 Unspecified asthma, uncomplicated: Secondary | ICD-10-CM | POA: Diagnosis not present

## 2018-07-06 DIAGNOSIS — M329 Systemic lupus erythematosus, unspecified: Secondary | ICD-10-CM | POA: Diagnosis not present

## 2018-07-06 DIAGNOSIS — S82892D Other fracture of left lower leg, subsequent encounter for closed fracture with routine healing: Secondary | ICD-10-CM | POA: Diagnosis not present

## 2018-07-06 DIAGNOSIS — S8265XD Nondisplaced fracture of lateral malleolus of left fibula, subsequent encounter for closed fracture with routine healing: Secondary | ICD-10-CM | POA: Diagnosis not present

## 2018-07-06 DIAGNOSIS — I10 Essential (primary) hypertension: Secondary | ICD-10-CM | POA: Diagnosis not present

## 2018-07-09 DIAGNOSIS — G894 Chronic pain syndrome: Secondary | ICD-10-CM | POA: Diagnosis not present

## 2018-07-09 DIAGNOSIS — M79604 Pain in right leg: Secondary | ICD-10-CM | POA: Diagnosis not present

## 2018-07-09 DIAGNOSIS — M79672 Pain in left foot: Secondary | ICD-10-CM | POA: Diagnosis not present

## 2018-07-09 DIAGNOSIS — S8266XS Nondisplaced fracture of lateral malleolus of unspecified fibula, sequela: Secondary | ICD-10-CM | POA: Diagnosis not present

## 2018-07-09 DIAGNOSIS — Z79891 Long term (current) use of opiate analgesic: Secondary | ICD-10-CM | POA: Diagnosis not present

## 2018-07-10 DIAGNOSIS — S8265XD Nondisplaced fracture of lateral malleolus of left fibula, subsequent encounter for closed fracture with routine healing: Secondary | ICD-10-CM | POA: Diagnosis not present

## 2018-07-10 DIAGNOSIS — M47816 Spondylosis without myelopathy or radiculopathy, lumbar region: Secondary | ICD-10-CM | POA: Diagnosis not present

## 2018-07-10 DIAGNOSIS — M179 Osteoarthritis of knee, unspecified: Secondary | ICD-10-CM | POA: Diagnosis not present

## 2018-07-10 DIAGNOSIS — I10 Essential (primary) hypertension: Secondary | ICD-10-CM | POA: Diagnosis not present

## 2018-07-10 DIAGNOSIS — S82892D Other fracture of left lower leg, subsequent encounter for closed fracture with routine healing: Secondary | ICD-10-CM | POA: Diagnosis not present

## 2018-07-10 DIAGNOSIS — M19019 Primary osteoarthritis, unspecified shoulder: Secondary | ICD-10-CM | POA: Diagnosis not present

## 2018-07-10 DIAGNOSIS — S93492D Sprain of other ligament of left ankle, subsequent encounter: Secondary | ICD-10-CM | POA: Diagnosis not present

## 2018-07-10 DIAGNOSIS — M329 Systemic lupus erythematosus, unspecified: Secondary | ICD-10-CM | POA: Diagnosis not present

## 2018-07-10 DIAGNOSIS — I739 Peripheral vascular disease, unspecified: Secondary | ICD-10-CM | POA: Diagnosis not present

## 2018-07-10 DIAGNOSIS — J45909 Unspecified asthma, uncomplicated: Secondary | ICD-10-CM | POA: Diagnosis not present

## 2018-07-13 ENCOUNTER — Telehealth (INDEPENDENT_AMBULATORY_CARE_PROVIDER_SITE_OTHER): Payer: Self-pay | Admitting: Orthopedic Surgery

## 2018-07-13 NOTE — Telephone Encounter (Signed)
IC, no answer LMVM advising.

## 2018-07-13 NOTE — Telephone Encounter (Signed)
Frankie/PT/[facility] [phone] called for verbal order 2x week for 2 weeks 1x week for 1 week  Want to continue strengthen and balancing  Please call Tharon Aquas to advise

## 2018-07-19 DIAGNOSIS — M179 Osteoarthritis of knee, unspecified: Secondary | ICD-10-CM | POA: Diagnosis not present

## 2018-07-19 DIAGNOSIS — M19019 Primary osteoarthritis, unspecified shoulder: Secondary | ICD-10-CM | POA: Diagnosis not present

## 2018-07-19 DIAGNOSIS — I10 Essential (primary) hypertension: Secondary | ICD-10-CM | POA: Diagnosis not present

## 2018-07-19 DIAGNOSIS — M329 Systemic lupus erythematosus, unspecified: Secondary | ICD-10-CM | POA: Diagnosis not present

## 2018-07-19 DIAGNOSIS — S8265XD Nondisplaced fracture of lateral malleolus of left fibula, subsequent encounter for closed fracture with routine healing: Secondary | ICD-10-CM | POA: Diagnosis not present

## 2018-07-19 DIAGNOSIS — M47816 Spondylosis without myelopathy or radiculopathy, lumbar region: Secondary | ICD-10-CM | POA: Diagnosis not present

## 2018-07-19 DIAGNOSIS — J45909 Unspecified asthma, uncomplicated: Secondary | ICD-10-CM | POA: Diagnosis not present

## 2018-07-19 DIAGNOSIS — S82892D Other fracture of left lower leg, subsequent encounter for closed fracture with routine healing: Secondary | ICD-10-CM | POA: Diagnosis not present

## 2018-07-19 DIAGNOSIS — I739 Peripheral vascular disease, unspecified: Secondary | ICD-10-CM | POA: Diagnosis not present

## 2018-07-19 DIAGNOSIS — S93492D Sprain of other ligament of left ankle, subsequent encounter: Secondary | ICD-10-CM | POA: Diagnosis not present

## 2018-07-26 DIAGNOSIS — S82892D Other fracture of left lower leg, subsequent encounter for closed fracture with routine healing: Secondary | ICD-10-CM | POA: Diagnosis not present

## 2018-07-26 DIAGNOSIS — I739 Peripheral vascular disease, unspecified: Secondary | ICD-10-CM | POA: Diagnosis not present

## 2018-07-26 DIAGNOSIS — S8265XD Nondisplaced fracture of lateral malleolus of left fibula, subsequent encounter for closed fracture with routine healing: Secondary | ICD-10-CM | POA: Diagnosis not present

## 2018-07-26 DIAGNOSIS — I10 Essential (primary) hypertension: Secondary | ICD-10-CM | POA: Diagnosis not present

## 2018-07-26 DIAGNOSIS — C801 Malignant (primary) neoplasm, unspecified: Secondary | ICD-10-CM | POA: Diagnosis not present

## 2018-07-26 DIAGNOSIS — M8668 Other chronic osteomyelitis, other site: Secondary | ICD-10-CM | POA: Diagnosis not present

## 2018-07-26 DIAGNOSIS — M19019 Primary osteoarthritis, unspecified shoulder: Secondary | ICD-10-CM | POA: Diagnosis not present

## 2018-07-26 DIAGNOSIS — M179 Osteoarthritis of knee, unspecified: Secondary | ICD-10-CM | POA: Diagnosis not present

## 2018-07-26 DIAGNOSIS — M329 Systemic lupus erythematosus, unspecified: Secondary | ICD-10-CM | POA: Diagnosis not present

## 2018-07-26 DIAGNOSIS — M47816 Spondylosis without myelopathy or radiculopathy, lumbar region: Secondary | ICD-10-CM | POA: Diagnosis not present

## 2018-07-26 DIAGNOSIS — J45909 Unspecified asthma, uncomplicated: Secondary | ICD-10-CM | POA: Diagnosis not present

## 2018-07-26 DIAGNOSIS — R52 Pain, unspecified: Secondary | ICD-10-CM | POA: Diagnosis not present

## 2018-07-26 DIAGNOSIS — S93492D Sprain of other ligament of left ankle, subsequent encounter: Secondary | ICD-10-CM | POA: Diagnosis not present
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
## 2018-08-03 ENCOUNTER — Other Ambulatory Visit: Payer: Self-pay

## 2018-08-03 ENCOUNTER — Encounter: Payer: Self-pay | Admitting: Internal Medicine

## 2018-08-03 ENCOUNTER — Ambulatory Visit (INDEPENDENT_AMBULATORY_CARE_PROVIDER_SITE_OTHER): Payer: Medicare HMO | Admitting: Internal Medicine

## 2018-08-03 VITALS — BP 129/61 | HR 78 | Temp 98.2°F | Ht 66.0 in

## 2018-08-03 DIAGNOSIS — Z0000 Encounter for general adult medical examination without abnormal findings: Secondary | ICD-10-CM

## 2018-08-03 DIAGNOSIS — M25522 Pain in left elbow: Secondary | ICD-10-CM | POA: Diagnosis not present

## 2018-08-03 DIAGNOSIS — M79622 Pain in left upper arm: Secondary | ICD-10-CM

## 2018-08-03 DIAGNOSIS — I10 Essential (primary) hypertension: Secondary | ICD-10-CM | POA: Diagnosis not present

## 2018-08-03 DIAGNOSIS — N6310 Unspecified lump in the right breast, unspecified quadrant: Secondary | ICD-10-CM

## 2018-08-03 DIAGNOSIS — Z9181 History of falling: Secondary | ICD-10-CM | POA: Diagnosis not present

## 2018-08-03 DIAGNOSIS — M869 Osteomyelitis, unspecified: Secondary | ICD-10-CM

## 2018-08-03 DIAGNOSIS — Z79899 Other long term (current) drug therapy: Secondary | ICD-10-CM

## 2018-08-03 DIAGNOSIS — Z7952 Long term (current) use of systemic steroids: Secondary | ICD-10-CM

## 2018-08-03 DIAGNOSIS — Z792 Long term (current) use of antibiotics: Secondary | ICD-10-CM

## 2018-08-03 DIAGNOSIS — M898X2 Other specified disorders of bone, upper arm: Secondary | ICD-10-CM

## 2018-08-03 DIAGNOSIS — M329 Systemic lupus erythematosus, unspecified: Secondary | ICD-10-CM

## 2018-08-03 DIAGNOSIS — R937 Abnormal findings on diagnostic imaging of other parts of musculoskeletal system: Secondary | ICD-10-CM | POA: Diagnosis not present

## 2018-08-03 DIAGNOSIS — E041 Nontoxic single thyroid nodule: Secondary | ICD-10-CM

## 2018-08-03 DIAGNOSIS — R911 Solitary pulmonary nodule: Secondary | ICD-10-CM | POA: Diagnosis not present

## 2018-08-03 DIAGNOSIS — M8668 Other chronic osteomyelitis, other site: Secondary | ICD-10-CM

## 2018-08-03 DIAGNOSIS — Z90710 Acquired absence of both cervix and uterus: Secondary | ICD-10-CM

## 2018-08-03 DIAGNOSIS — Z8542 Personal history of malignant neoplasm of other parts of uterus: Secondary | ICD-10-CM

## 2018-08-03 NOTE — Assessment & Plan Note (Signed)
The patient's blood pressure during this visit was 129/61. The patient is currently taking amlodipine 10mg  qd. Her last blood pressure visits are  BP Readings from Last 3 Encounters:  08/03/18 129/61  05/29/18 139/88  05/17/18 (!) 158/93   The patient does not report palpitations, dizziness, chest pain, sob.  Assessment and plan Continue amlodipine 10mg  qd

## 2018-08-03 NOTE — Assessment & Plan Note (Signed)
Per patient's ct chest done on 05/13/18 the patient has a left upper lobe pulmonary nodule that is 24mm in size that is unchanged from 03/16/17.   Assessment and plan  The patient is recommended to have ct follow up by May 2020.

## 2018-08-03 NOTE — Assessment & Plan Note (Signed)
Per patient's ct chest done on 05/14/18 the patient has a left lobe thyroid nodule. The patient does not have tsh or t4 levels on file.   Assessment and plan.  Will need to get tsh and t4 levels to check thyroid function. Unfortunately, was not able to obtain at this visit as patient was in hurry to leave to meet her [employer] ride. Will continue to follow patient closely regarding this. Will also need to obtain thyroid ultrasound.

## 2018-08-03 NOTE — Assessment & Plan Note (Signed)
The patient has chronic avascular necrosis of her right hip and osteomyelitis for which she has had numerous procedures including arthroplasty and girdlestone procedure.   She is currently continuing to take amoxicillin 500mg  q8hrs irregularly . The patient has been lost to follow up with infectious disease since 2017.   Assessment and plan  The patient is afebrile. Referred the patient to infectious disease to determine if she continues to need amoxicillin.

## 2018-08-03 NOTE — Assessment & Plan Note (Signed)
The patient states that she has not had any lupus flares in the past few months. She is currently taking plaquenil 200mg  bid and prednisone 5mg  4 times daily.   Assessment and plan  Continue current medication regimen. Referred to rheumatology to be followed for SLE.
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
## 2018-08-03 NOTE — Patient Instructions (Signed)
Patient left before she was able to receive her AVS as she needed to make her [employer] ride. Patient was called and given instructions regarding her care.

## 2018-08-03 NOTE — Assessment & Plan Note (Signed)
The patient has had right breast mass that was noted since 2017. She has been encouraged by previous providers to have this mass worked up, but has unfortunately has not been able to do so and has either cancelled or no showed to numerous appointments.   On recent hospitalization in July 2017 CT cervical spine found multiple lucent lesions concerning for metastatic disease. The primary tumor is thought to with breast malignancy possibly being the primary tumor. CT chest done 05/13/18 did not show any supraclavicular, axillary, mediastinal, or hilar adenopathy.   Of note the patient has a history of uterine cancer treated with TAH in 1997.   Assessment and plan  Ordered stat bilateral diagnostic mammogram to further characterize the breast mass. Counseled patient on the importance of making sure that she gets her mammogram completed as it is possible that the breast mass maybe a malignancy that has metastasized.   If malignancy is found, will need to refer to oncology and consider bone scan.

## 2018-08-06 DIAGNOSIS — C7951 Secondary malignant neoplasm of bone: Secondary | ICD-10-CM | POA: Diagnosis not present

## 2018-08-06 DIAGNOSIS — M79604 Pain in right leg: Secondary | ICD-10-CM | POA: Diagnosis not present

## 2018-08-06 DIAGNOSIS — M79672 Pain in left foot: Secondary | ICD-10-CM | POA: Diagnosis not present

## 2018-08-06 DIAGNOSIS — S8266XS Nondisplaced fracture of lateral malleolus of unspecified fibula, sequela: Secondary | ICD-10-CM | POA: Diagnosis not present

## 2018-08-06 DIAGNOSIS — G894 Chronic pain syndrome: Secondary | ICD-10-CM | POA: Diagnosis not present

## 2018-08-06 DIAGNOSIS — C50119 Malignant neoplasm of central portion of unspecified female breast: Secondary | ICD-10-CM | POA: Diagnosis not present

## 2018-08-06 DIAGNOSIS — Z79891 Long term (current) use of opiate analgesic: Secondary | ICD-10-CM | POA: Diagnosis not present

## 2018-08-06 NOTE — Progress Notes (Signed)
Internal Medicine Clinic Attending  I saw and evaluated the patient.  I personally confirmed the key portions of the history and exam documented by Dr. Maricela Bo and I reviewed pertinent patient test results.  The assessment, diagnosis, and plan were formulated together and I agree with the documentation in the resident's note.  High risk breast mass and abnormal CT cervical spine from July that showed possible lytic lesions to the bone. The patient has many health issues going on right now, but this takes priority until we can better clarify if this breast mass is malignant. We will follow up with her closely by phone and visit.

## 2018-08-07 ENCOUNTER — Telehealth: Payer: Self-pay | Admitting: *Deleted

## 2018-08-07 NOTE — Telephone Encounter (Signed)
-----   Message from Lars Mage, MD sent at 08/06/2018  3:44 PM EDT ----- Please help arrange for the above patient to have diagnostic mammogram ASAP. I have expressed the importance of getting this done to the patient. Thank you!  Vahini

## 2018-08-07 NOTE — Telephone Encounter (Signed)
Called pt this morning about scheduling diagnostic mammogram. She will call [facility]; telephone # [phone] given to pt. Pt also asked about "check out papers" since she had to leave in a hurry at last office visit; I will mail AVS to her.

## 2018-08-13 ENCOUNTER — Encounter: Payer: Self-pay | Admitting: *Deleted

## 2018-08-14 ENCOUNTER — Inpatient Hospital Stay: Admission: RE | Admit: 2018-08-14 | Payer: Medicare HMO | Source: Ambulatory Visit

## 2018-08-14 NOTE — Telephone Encounter (Signed)
Call made to patient-she was a no show for her diagnostic mammo/ultrasound appt that was scheduled for earlier today.  Pt states that she has a lot going on this week and requests to have appt scheduled for next week.  Pt informed of the importance of getting test done as ASAP. Pt verbalized understanding, but requests appt to be scheduled for next week.  Call made to [facility] scheduled for Wed Oct 23rd 9am arrival.  Pt contacted with appt time and date.Despina Hidden Cassady10/15/20193:57 PM

## 2018-08-16 ENCOUNTER — Encounter: Payer: Self-pay | Admitting: Internal Medicine

## 2018-08-16 NOTE — Progress Notes (Signed)
Thanks for following her closely.

## 2018-08-16 NOTE — Progress Notes (Unsigned)
Called patient and spoke to her about missing her mammogram appointment that was originally scheduled for 08/14/18. Explained the importance of her getting her mammogram done. Informed her that we will give her a reminder call next week. She said that she will go to the rescheduled appointment on 08/22/18. She does not have any transportation difficulties to get to this appointment. Will continue to follow up regarding this.  Lars Mage, MD Internal Medicine PGY2 Pager:[phone] 08/16/2018, 8:32 AM

## 2018-08-22 ENCOUNTER — Ambulatory Visit
Admission: RE | Admit: 2018-08-22 | Discharge: 2018-08-22 | Disposition: A | Discharge status: Home / Self Care | Payer: Medicare HMO | Source: Ambulatory Visit | Attending: Internal Medicine | Admitting: Internal Medicine

## 2018-08-22 ENCOUNTER — Encounter: Payer: Self-pay | Admitting: Internal Medicine

## 2018-08-22 ENCOUNTER — Other Ambulatory Visit: Payer: Self-pay | Admitting: *Deleted

## 2018-08-22 DIAGNOSIS — N6310 Unspecified lump in the right breast, unspecified quadrant: Secondary | ICD-10-CM

## 2018-08-22 DIAGNOSIS — R928 Other abnormal and inconclusive findings on diagnostic imaging of breast: Secondary | ICD-10-CM | POA: Diagnosis not present

## 2018-08-22 DIAGNOSIS — N6313 Unspecified lump in the right breast, lower outer quadrant: Secondary | ICD-10-CM | POA: Diagnosis not present

## 2018-08-22 NOTE — Progress Notes (Signed)
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 Will continue to monitor with screening mammogram in 1 year.   Lars Mage, MD Internal Medicine PGY2 [phone] 08/22/2018, 9:35 PM

## 2018-08-25 DIAGNOSIS — M8668 Other chronic osteomyelitis, other site: Secondary | ICD-10-CM | POA: Diagnosis not present

## 2018-08-25 DIAGNOSIS — S82892D Other fracture of left lower leg, subsequent encounter for closed fracture with routine healing: Secondary | ICD-10-CM | POA: Diagnosis not present

## 2018-08-25 DIAGNOSIS — R52 Pain, unspecified: Secondary | ICD-10-CM | POA: Diagnosis not present

## 2018-08-25 DIAGNOSIS — C801 Malignant (primary) neoplasm, unspecified: Secondary | ICD-10-CM | POA: Diagnosis not present

## 2018-08-27 ENCOUNTER — Ambulatory Visit: Payer: Medicare HMO | Admitting: Internal Medicine
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
## 2018-08-29 ENCOUNTER — Ambulatory Visit: Payer: Medicare HMO | Admitting: Infectious Disease

## 2018-09-11 ENCOUNTER — Other Ambulatory Visit: Payer: Self-pay | Admitting: Internal Medicine

## 2018-09-11 ENCOUNTER — Ambulatory Visit: Payer: Medicare HMO | Admitting: Rheumatology

## 2018-09-11 NOTE — Addendum Note (Signed)
Addended by: Yvonna Alanis E on: 09/11/2018 04:00 PM   Modules accepted: Orders

## 2018-09-12 DIAGNOSIS — M79672 Pain in left foot: Secondary | ICD-10-CM | POA: Diagnosis not present

## 2018-09-12 DIAGNOSIS — Z79891 Long term (current) use of opiate analgesic: Secondary | ICD-10-CM | POA: Diagnosis not present

## 2018-09-12 DIAGNOSIS — G894 Chronic pain syndrome: Secondary | ICD-10-CM | POA: Diagnosis not present

## 2018-09-12 DIAGNOSIS — M79604 Pain in right leg: Secondary | ICD-10-CM | POA: Diagnosis not present

## 2018-09-12 DIAGNOSIS — C7951 Secondary malignant neoplasm of bone: Secondary | ICD-10-CM | POA: Diagnosis not present

## 2018-09-12 DIAGNOSIS — S8266XS Nondisplaced fracture of lateral malleolus of unspecified fibula, sequela: Secondary | ICD-10-CM | POA: Diagnosis not present

## 2018-09-12 DIAGNOSIS — C50119 Malignant neoplasm of central portion of unspecified female breast: Secondary | ICD-10-CM | POA: Diagnosis not present

## 2018-09-12 NOTE — Addendum Note (Signed)
Addended by: Hulan Fray on: 09/12/2018 07:31 PM   Modules accepted: Orders

## 2018-09-14 ENCOUNTER — Other Ambulatory Visit: Payer: Self-pay | Admitting: Internal Medicine

## 2018-09-14 ENCOUNTER — Telehealth: Payer: Self-pay | Admitting: *Deleted

## 2018-09-14 NOTE — Telephone Encounter (Signed)
Called pt - mailbox full, unable to leave message. I will send message to front office to try later.

## 2018-09-14 NOTE — Telephone Encounter (Signed)
-----   Message from Lars Mage, MD sent at 09/14/2018  9:57 AM EST ----- Patient needs to be scheduled for a clinic appointment with me ASAP please. She has lots of chronic diseases that she needs to be followed for and unfortunately she has many no shows on her appts. Thanks!  Vahini

## 2018-09-18 NOTE — Telephone Encounter (Signed)
If possible please try for an earlier appointment

## 2018-09-18 NOTE — Telephone Encounter (Signed)
Appt scheduled per front desk 2 /14/20.

## 2018-09-20 NOTE — Telephone Encounter (Signed)
Yes we should schedule an appt in acc to continue workup I began at previous visit please. This patient is high risk and needs to be closely followed. Thank you!

## 2018-09-20 NOTE — Telephone Encounter (Signed)
If patient needs to be seen sooner it would have to be in [facility].  Please advise if you would like the patient to have a future appt in [facility].  Forwarding back to pcp and triage.

## 2018-09-25 DIAGNOSIS — C801 Malignant (primary) neoplasm, unspecified: Secondary | ICD-10-CM | POA: Diagnosis not present

## 2018-09-25 DIAGNOSIS — S82892D Other fracture of left lower leg, subsequent encounter for closed fracture with routine healing: Secondary | ICD-10-CM | POA: Diagnosis not present

## 2018-09-25 DIAGNOSIS — M8668 Other chronic osteomyelitis, other site: Secondary | ICD-10-CM | POA: Diagnosis not present

## 2018-09-25 DIAGNOSIS — R52 Pain, unspecified: Secondary | ICD-10-CM | POA: Diagnosis not present

## 2018-10-04 ENCOUNTER — Telehealth (INDEPENDENT_AMBULATORY_CARE_PROVIDER_SITE_OTHER): Payer: Self-pay | Admitting: Orthopedic Surgery

## 2018-10-04 NOTE — Telephone Encounter (Signed)
Returned call to patient got message mailbox is full

## 2018-10-05 ENCOUNTER — Ambulatory Visit: Payer: Medicare HMO

## 2018-10-05 ENCOUNTER — Encounter: Payer: Self-pay | Admitting: Internal Medicine

## 2018-10-09 ENCOUNTER — Ambulatory Visit: Payer: Medicare HMO | Admitting: Physician Assistant

## 2018-10-10 DIAGNOSIS — G894 Chronic pain syndrome: Secondary | ICD-10-CM | POA: Diagnosis not present

## 2018-10-10 DIAGNOSIS — Z79891 Long term (current) use of opiate analgesic: Secondary | ICD-10-CM | POA: Diagnosis not present

## 2018-10-10 DIAGNOSIS — M79604 Pain in right leg: Secondary | ICD-10-CM | POA: Diagnosis not present

## 2018-10-10 DIAGNOSIS — C50119 Malignant neoplasm of central portion of unspecified female breast: Secondary | ICD-10-CM | POA: Diagnosis not present

## 2018-10-10 DIAGNOSIS — S8266XS Nondisplaced fracture of lateral malleolus of unspecified fibula, sequela: Secondary | ICD-10-CM | POA: Diagnosis not present

## 2018-10-10 DIAGNOSIS — C7951 Secondary malignant neoplasm of bone: Secondary | ICD-10-CM | POA: Diagnosis not present

## 2018-10-10 DIAGNOSIS — M79672 Pain in left foot: Secondary | ICD-10-CM | POA: Diagnosis not present

## 2018-10-25 DIAGNOSIS — M8668 Other chronic osteomyelitis, other site: Secondary | ICD-10-CM | POA: Diagnosis not present

## 2018-10-25 DIAGNOSIS — R52 Pain, unspecified: Secondary | ICD-10-CM | POA: Diagnosis not present

## 2018-10-25 DIAGNOSIS — S82892D Other fracture of left lower leg, subsequent encounter for closed fracture with routine healing: Secondary | ICD-10-CM | POA: Diagnosis not present

## 2018-10-25 DIAGNOSIS — C801 Malignant (primary) neoplasm, unspecified: Secondary | ICD-10-CM | POA: Diagnosis not present

## 2018-11-09 ENCOUNTER — Other Ambulatory Visit: Payer: Self-pay | Admitting: Internal Medicine

## 2018-11-09 DIAGNOSIS — M329 Systemic lupus erythematosus, unspecified: Secondary | ICD-10-CM

## 2018-11-09 NOTE — Telephone Encounter (Signed)
Next appt scheduled 12/14/18 with PCP.

## 2018-11-10 ENCOUNTER — Other Ambulatory Visit: Payer: Self-pay | Admitting: Internal Medicine

## 2018-11-19 DIAGNOSIS — S8266XS Nondisplaced fracture of lateral malleolus of unspecified fibula, sequela: Secondary | ICD-10-CM | POA: Diagnosis not present

## 2018-11-19 DIAGNOSIS — G894 Chronic pain syndrome: Secondary | ICD-10-CM | POA: Diagnosis not present

## 2018-11-19 DIAGNOSIS — M79672 Pain in left foot: Secondary | ICD-10-CM | POA: Diagnosis not present

## 2018-11-19 DIAGNOSIS — C7951 Secondary malignant neoplasm of bone: Secondary | ICD-10-CM | POA: Diagnosis not present

## 2018-11-19 DIAGNOSIS — M79604 Pain in right leg: Secondary | ICD-10-CM | POA: Diagnosis not present

## 2018-11-19 DIAGNOSIS — C50119 Malignant neoplasm of central portion of unspecified female breast: Secondary | ICD-10-CM | POA: Diagnosis not present

## 2018-11-19 DIAGNOSIS — Z79891 Long term (current) use of opiate analgesic: Secondary | ICD-10-CM | POA: Diagnosis not present
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
## 2018-11-23 ENCOUNTER — Other Ambulatory Visit: Payer: Self-pay

## 2018-11-23 ENCOUNTER — Encounter: Payer: Self-pay | Admitting: Internal Medicine

## 2018-11-23 ENCOUNTER — Ambulatory Visit (INDEPENDENT_AMBULATORY_CARE_PROVIDER_SITE_OTHER): Payer: Medicare HMO | Admitting: Internal Medicine

## 2018-11-23 ENCOUNTER — Encounter (INDEPENDENT_AMBULATORY_CARE_PROVIDER_SITE_OTHER): Payer: Self-pay

## 2018-11-23 VITALS — BP 119/94 | HR 92 | Temp 98.1°F | Ht 66.0 in | Wt 278.2 lb

## 2018-11-23 DIAGNOSIS — E041 Nontoxic single thyroid nodule: Secondary | ICD-10-CM | POA: Diagnosis not present

## 2018-11-23 DIAGNOSIS — M899 Disorder of bone, unspecified: Secondary | ICD-10-CM | POA: Diagnosis not present

## 2018-11-23 DIAGNOSIS — R911 Solitary pulmonary nodule: Secondary | ICD-10-CM

## 2018-11-23 DIAGNOSIS — N6310 Unspecified lump in the right breast, unspecified quadrant: Secondary | ICD-10-CM

## 2018-11-23 DIAGNOSIS — N6021 Fibroadenosis of right breast: Secondary | ICD-10-CM

## 2018-11-23 DIAGNOSIS — I10 Essential (primary) hypertension: Secondary | ICD-10-CM | POA: Diagnosis not present

## 2018-11-23 DIAGNOSIS — Z79899 Other long term (current) drug therapy: Secondary | ICD-10-CM | POA: Diagnosis not present

## 2018-11-23 DIAGNOSIS — J45909 Unspecified asthma, uncomplicated: Secondary | ICD-10-CM

## 2018-11-23 DIAGNOSIS — Z0000 Encounter for general adult medical examination without abnormal findings: Secondary | ICD-10-CM

## 2018-11-23 DIAGNOSIS — M8668 Other chronic osteomyelitis, other site: Secondary | ICD-10-CM

## 2018-11-23 DIAGNOSIS — M329 Systemic lupus erythematosus, unspecified: Secondary | ICD-10-CM

## 2018-11-23 DIAGNOSIS — R5383 Other fatigue: Secondary | ICD-10-CM

## 2018-11-23 MED ORDER — MOMETASONE FURO-FORMOTEROL FUM 100-5 MCG/ACT IN AERO: 2.0000 | INHALATION_SPRAY | Freq: Two times a day (BID) | RESPIRATORY_TRACT | 0 refills | Status: AC

## 2018-11-23 MED ORDER — FERROUS SULFATE 325 (65 FE) MG PO TABS: 325.0000 mg | ORAL_TABLET | Freq: Every day | ORAL | 3 refills | Status: AC

## 2018-11-23 MED ORDER — DULOXETINE HCL 30 MG PO CPEP: 30.0000 mg | ORAL_CAPSULE | Freq: Every day | ORAL | 0 refills | Status: DC

## 2018-11-23 NOTE — Assessment & Plan Note (Signed)
At last visit the patient was referred to rheumatology to be followed for her SLE. She has not gone to visit a rheumatologist since last visit The patient is still taking plaquenil 200mg  bid and prednisone 40mg  daily.   The patient states that when she has a lupus flare she notes a malar rash and joint pain in her bilateral elbows, knees, and toes.   Assessment and plan  Will re-refer to rheumatology for the patient to be tapered off of prednisone an placed on a long term stable maintenance medication.

## 2018-11-23 NOTE — Assessment & Plan Note (Signed)
Thyroid nodule found in left lobe on chest CT July 2019. Patient does not have thyroid symptoms.   Assessment and plan  TSH and T4 ordered and may need ultrasound subsequently.

## 2018-11-23 NOTE — Assessment & Plan Note (Addendum)
Patient continues to be taking amoxicillin daily for her right hip osteomyelitis. I have made a referral to infectious disease to determine if she will continue to need this.

## 2018-11-23 NOTE — Assessment & Plan Note (Signed)
Patient will need to have repeat CT done in May 2020 to determine if pulmonary nodule is increasing in size.

## 2018-11-23 NOTE — Assessment & Plan Note (Signed)
Mammogram revealed fibroadenolipoma. Follow up mammogram oct 2020 for which I have placed a future order.

## 2018-11-23 NOTE — Addendum Note (Signed)
Addended by: Lars Mage on: 11/23/2018 07:18 PM   Modules accepted: Orders

## 2018-11-23 NOTE — Assessment & Plan Note (Signed)
The patient's blood pressure during this visit was 119/94. The patient is currently being prescribed amlodipine 10mg  qd which she is taking daily. His last blood pressure visits are   BP Readings from Last 3 Encounters:  11/23/18 (!) 119/94  08/03/18 129/61  05/29/18 139/88   The patient does have some occasional dizziness.   Assessment and Plan Patient's blood pressure well controlled. Continue amlodipine 10mg  qd.

## 2018-11-23 NOTE — Assessment & Plan Note (Addendum)
Patient had ct cervical spine done in July 2019 that showed multiple lucent lesions in the cervical spine that were concerning for metastatic disease.   Due to concern for possible metastatic disease workup was done to evaluate for source of primary tumor. 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 Ct chest done in July 2019 showed 0.8x2.7cm pulmonary nodule in the left upper lobe that was unchanged from prior imaging.   Assessment and plan  It is possible that the radiolucency was just from reactive changes. However, will also get IFE and kappa/lamda chain to evaluate for multiple myeloma. The patient does not have calcium or renal abnormalities.

## 2018-11-23 NOTE — Patient Instructions (Signed)
It was a pleasure to see you today [name].   -Please follow up with rheumatology and infectious disease  -I have sent you to get a colonscopy -I have gotten some blood from you today to evaluate for thyroid disease   If you have any questions or concerns, please call our clinic at [phone] between 9am-5pm and after hours call [phone] and ask for the internal medicine resident on call. If you feel you are having a medical emergency please call 911.   Thank you, we look forward to help you remain healthy!  Lars Mage, MD Internal Medicine PGY2

## 2018-11-23 NOTE — Assessment & Plan Note (Signed)
Patient states that she has been noting more shortness of breath. She is currently taking breo and albuterol. She states that she was previously on dulera which she felt that she had more benefit from.   Assessment and plan  I have prescribed dulera for the patient to try again.

## 2018-11-23 NOTE — Assessment & Plan Note (Signed)
Ordered colonoscopy

## 2018-11-25 DIAGNOSIS — S82892D Other fracture of left lower leg, subsequent encounter for closed fracture with routine healing: Secondary | ICD-10-CM | POA: Diagnosis not present

## 2018-11-25 DIAGNOSIS — C801 Malignant (primary) neoplasm, unspecified: Secondary | ICD-10-CM | POA: Diagnosis not present

## 2018-11-25 DIAGNOSIS — R52 Pain, unspecified: Secondary | ICD-10-CM | POA: Diagnosis not present

## 2018-11-25 DIAGNOSIS — M8668 Other chronic osteomyelitis, other site: Secondary | ICD-10-CM | POA: Diagnosis not present

## 2018-11-26 NOTE — Progress Notes (Signed)
Internal Medicine Clinic Attending  Case discussed with Dr. Chundi at the time of the visit.  We reviewed the resident's history and exam and pertinent patient test results.  I agree with the assessment, diagnosis, and plan of care documented in the resident's note. 

## 2018-11-27 LAB — T4, FREE: FREE T4: 1.26 ng/dL (ref 0.82–1.77)

## 2018-11-27 LAB — IMMUNOFIXATION ELECTROPHORESIS
IGA/IMMUNOGLOBULIN A, SERUM: 286 mg/dL (ref 87–352)
IGG (IMMUNOGLOBIN G), SERUM: 1506 mg/dL (ref 700–1600)
IgM (Immunoglobulin M), Srm: 55 mg/dL (ref 26–217)
Total Protein: 7 g/dL (ref 6.0–8.5)

## 2018-11-27 LAB — KAPPA/LAMBDA LIGHT CHAINS
IG LAMBDA FREE LIGHT CHAIN: 22.3 mg/L (ref 5.7–26.3)
Ig Kappa Free Light Chain: 26.1 mg/L — ABNORMAL HIGH (ref 3.3–19.4)
Kappa/Lambda FluidC Ratio: 1.17 (ref 0.26–1.65)

## 2018-11-27 LAB — TSH: TSH: 1.29 u[IU]/mL (ref 0.450–4.500)

## 2018-11-28 ENCOUNTER — Telehealth: Payer: Self-pay | Admitting: *Deleted

## 2018-11-28 NOTE — Telephone Encounter (Signed)
The patient should be continued on her current breo script then. I mentioned to patient that Ruthe Mannan may not be covered due to insurance during her visit, but we thought we would try. Thank you!

## 2018-11-28 NOTE — Telephone Encounter (Signed)
Becky with [employer] called stating Katherine Banks is not covered by patient's insurance but Symbicort, Advair and Memory Dance are covered. Hubbard Hartshorn, RN, BSN

## 2018-11-29 NOTE — Telephone Encounter (Signed)
Called pt to inform her to continue Breo - no answer; "call cannot be completed at this time".

## 2018-12-01 ENCOUNTER — Other Ambulatory Visit: Payer: Self-pay | Admitting: Internal Medicine

## 2018-12-01 DIAGNOSIS — M329 Systemic lupus erythematosus, unspecified: Secondary | ICD-10-CM

## 2018-12-03 NOTE — Telephone Encounter (Signed)
Called pt - informed to continued Breo per Dr Maricela Bo. Pt stated ok but it does not work for her;"I'll be back on there(the hospital)".

## 2018-12-03 NOTE — Telephone Encounter (Signed)
Next appt scheduled  3/20 with PCP. 

## 2018-12-05 ENCOUNTER — Other Ambulatory Visit: Payer: Self-pay | Admitting: Internal Medicine

## 2018-12-05 DIAGNOSIS — M329 Systemic lupus erythematosus, unspecified: Secondary | ICD-10-CM

## 2018-12-05 NOTE — Progress Notes (Signed)
Re-referring to rheumatology as she was declined to be seen by Dr. Arlean Hopping office.

## 2018-12-05 NOTE — Telephone Encounter (Signed)
Called pt - stated when she uses Breo, she continues to cough and wheeze;"it just doesn't work".

## 2018-12-05 NOTE — Telephone Encounter (Signed)
Why does the breo not work for her?

## 2018-12-06 NOTE — Telephone Encounter (Signed)
Called pt - mailbox is full, unable to leave a message. Pt already has an appt scheduled  March 20 per Epic.

## 2018-12-06 NOTE — Telephone Encounter (Signed)
Not a problem, thanks [place]!

## 2018-12-06 NOTE — Telephone Encounter (Signed)
Katherine Banks will need to schedule an appointment for a formal evaluation for her breathing. She has numerous chronic problems and will need more frequent appointments for them to all be addressed. Thank you!

## 2018-12-10 ENCOUNTER — Ambulatory Visit: Payer: Medicare HMO | Admitting: Internal Medicine

## 2018-12-14 ENCOUNTER — Encounter: Payer: Medicare HMO | Admitting: Internal Medicine

## 2018-12-17 DIAGNOSIS — Z79891 Long term (current) use of opiate analgesic: Secondary | ICD-10-CM | POA: Diagnosis not present

## 2018-12-17 DIAGNOSIS — S8266XS Nondisplaced fracture of lateral malleolus of unspecified fibula, sequela: Secondary | ICD-10-CM | POA: Diagnosis not present

## 2018-12-17 DIAGNOSIS — C7951 Secondary malignant neoplasm of bone: Secondary | ICD-10-CM | POA: Diagnosis not present

## 2018-12-17 DIAGNOSIS — M79672 Pain in left foot: Secondary | ICD-10-CM | POA: Diagnosis not present

## 2018-12-17 DIAGNOSIS — G894 Chronic pain syndrome: Secondary | ICD-10-CM | POA: Diagnosis not present

## 2018-12-17 DIAGNOSIS — C50119 Malignant neoplasm of central portion of unspecified female breast: Secondary | ICD-10-CM | POA: Diagnosis not present

## 2018-12-17 DIAGNOSIS — M79604 Pain in right leg: Secondary | ICD-10-CM | POA: Diagnosis not present

## 2018-12-19 ENCOUNTER — Encounter: Payer: Self-pay | Admitting: *Deleted

## 2018-12-26 DIAGNOSIS — C801 Malignant (primary) neoplasm, unspecified: Secondary | ICD-10-CM | POA: Diagnosis not present

## 2018-12-26 DIAGNOSIS — S82892D Other fracture of left lower leg, subsequent encounter for closed fracture with routine healing: Secondary | ICD-10-CM | POA: Diagnosis not present

## 2018-12-26 DIAGNOSIS — R52 Pain, unspecified: Secondary | ICD-10-CM | POA: Diagnosis not present

## 2018-12-26 DIAGNOSIS — M8668 Other chronic osteomyelitis, other site: Secondary | ICD-10-CM | POA: Diagnosis not present

## 2019-01-02 ENCOUNTER — Ambulatory Visit: Payer: Medicare HMO | Admitting: Infectious Disease

## 2019-01-08 ENCOUNTER — Other Ambulatory Visit: Payer: Self-pay | Admitting: Internal Medicine

## 2019-01-08 DIAGNOSIS — M329 Systemic lupus erythematosus, unspecified: Secondary | ICD-10-CM

## 2019-01-18 ENCOUNTER — Encounter: Payer: Medicare HMO | Admitting: Internal Medicine

## 2019-01-22 ENCOUNTER — Ambulatory Visit: Payer: Medicare HMO | Admitting: Infectious Disease

## 2019-01-22 NOTE — Addendum Note (Signed)
Addended by: Hulan Fray on: 01/22/2019 05:06 PM   Modules accepted: Orders

## 2019-01-24 DIAGNOSIS — C801 Malignant (primary) neoplasm, unspecified: Secondary | ICD-10-CM | POA: Diagnosis not present

## 2019-01-24 DIAGNOSIS — R52 Pain, unspecified: Secondary | ICD-10-CM | POA: Diagnosis not present

## 2019-01-24 DIAGNOSIS — S82892D Other fracture of left lower leg, subsequent encounter for closed fracture with routine healing: Secondary | ICD-10-CM | POA: Diagnosis not present

## 2019-01-24 DIAGNOSIS — M8668 Other chronic osteomyelitis, other site: Secondary | ICD-10-CM | POA: Diagnosis not present

## 2019-01-28 ENCOUNTER — Other Ambulatory Visit: Payer: Self-pay | Admitting: Internal Medicine

## 2019-01-31 ENCOUNTER — Telehealth: Payer: Self-pay | Admitting: *Deleted

## 2019-01-31 NOTE — Telephone Encounter (Addendum)
Information was sent through CoverMyMeds to [facility] for PA for Diclofenac Gel.  Awaiting determination within 24 hours.  Sander Nephew, RN 01/31/2019  2:29 PM. PA for Diclofenac Gel was approved 10/29/2018 thru 10/31/2019.  Sander Nephew, RN 02/01/2019 9:09 AM.

## 2019-02-04 DIAGNOSIS — C50119 Malignant neoplasm of central portion of unspecified female breast: Secondary | ICD-10-CM | POA: Diagnosis not present

## 2019-02-04 DIAGNOSIS — C7951 Secondary malignant neoplasm of bone: Secondary | ICD-10-CM | POA: Diagnosis not present

## 2019-02-04 DIAGNOSIS — G894 Chronic pain syndrome: Secondary | ICD-10-CM | POA: Diagnosis not present

## 2019-02-04 DIAGNOSIS — M79604 Pain in right leg: Secondary | ICD-10-CM | POA: Diagnosis not present

## 2019-02-04 DIAGNOSIS — Z79891 Long term (current) use of opiate analgesic: Secondary | ICD-10-CM | POA: Diagnosis not present

## 2019-02-04 DIAGNOSIS — M79672 Pain in left foot: Secondary | ICD-10-CM | POA: Diagnosis not present

## 2019-02-04 DIAGNOSIS — S8266XS Nondisplaced fracture of lateral malleolus of unspecified fibula, sequela: Secondary | ICD-10-CM | POA: Diagnosis not present

## 2019-02-07 ENCOUNTER — Emergency Department (HOSPITAL_COMMUNITY): Payer: Medicare HMO

## 2019-02-07 ENCOUNTER — Emergency Department (HOSPITAL_COMMUNITY)
Admission: EM | Admit: 2019-02-07 | Discharge: 2019-02-07 | Disposition: A | Discharge status: Home / Self Care | Payer: Medicare HMO | Attending: Emergency Medicine | Admitting: Emergency Medicine

## 2019-02-07 DIAGNOSIS — Z87891 Personal history of nicotine dependence: Secondary | ICD-10-CM | POA: Diagnosis not present

## 2019-02-07 DIAGNOSIS — Y9301 Activity, walking, marching and hiking: Secondary | ICD-10-CM | POA: Insufficient documentation

## 2019-02-07 DIAGNOSIS — R05 Cough: Secondary | ICD-10-CM | POA: Insufficient documentation

## 2019-02-07 DIAGNOSIS — Z79899 Other long term (current) drug therapy: Secondary | ICD-10-CM | POA: Diagnosis not present

## 2019-02-07 DIAGNOSIS — Z86711 Personal history of pulmonary embolism: Secondary | ICD-10-CM | POA: Diagnosis not present

## 2019-02-07 DIAGNOSIS — R070 Pain in throat: Secondary | ICD-10-CM | POA: Diagnosis not present

## 2019-02-07 DIAGNOSIS — M3210 Systemic lupus erythematosus, organ or system involvement unspecified: Secondary | ICD-10-CM | POA: Insufficient documentation

## 2019-02-07 DIAGNOSIS — Z7982 Long term (current) use of aspirin: Secondary | ICD-10-CM | POA: Insufficient documentation

## 2019-02-07 DIAGNOSIS — G8911 Acute pain due to trauma: Secondary | ICD-10-CM | POA: Diagnosis not present

## 2019-02-07 DIAGNOSIS — Z91040 Latex allergy status: Secondary | ICD-10-CM | POA: Diagnosis not present

## 2019-02-07 DIAGNOSIS — R52 Pain, unspecified: Secondary | ICD-10-CM | POA: Diagnosis not present

## 2019-02-07 DIAGNOSIS — R197 Diarrhea, unspecified: Secondary | ICD-10-CM

## 2019-02-07 DIAGNOSIS — R059 Cough, unspecified: Secondary | ICD-10-CM

## 2019-02-07 DIAGNOSIS — Z8709 Personal history of other diseases of the respiratory system: Secondary | ICD-10-CM | POA: Insufficient documentation

## 2019-02-07 DIAGNOSIS — Y929 Unspecified place or not applicable: Secondary | ICD-10-CM | POA: Insufficient documentation

## 2019-02-07 DIAGNOSIS — W1830XA Fall on same level, unspecified, initial encounter: Secondary | ICD-10-CM | POA: Diagnosis not present

## 2019-02-07 DIAGNOSIS — S161XXA Strain of muscle, fascia and tendon at neck level, initial encounter: Secondary | ICD-10-CM | POA: Diagnosis not present

## 2019-02-07 DIAGNOSIS — Y999 Unspecified external cause status: Secondary | ICD-10-CM | POA: Diagnosis not present

## 2019-02-07 DIAGNOSIS — R1084 Generalized abdominal pain: Secondary | ICD-10-CM | POA: Diagnosis not present

## 2019-02-07 DIAGNOSIS — M542 Cervicalgia: Secondary | ICD-10-CM | POA: Diagnosis not present

## 2019-02-07 DIAGNOSIS — S0990XA Unspecified injury of head, initial encounter: Secondary | ICD-10-CM | POA: Diagnosis not present

## 2019-02-07 DIAGNOSIS — R51 Headache: Secondary | ICD-10-CM | POA: Diagnosis present

## 2019-02-07 DIAGNOSIS — M25551 Pain in right hip: Secondary | ICD-10-CM | POA: Diagnosis not present

## 2019-02-07 DIAGNOSIS — Z86718 Personal history of other venous thrombosis and embolism: Secondary | ICD-10-CM | POA: Insufficient documentation

## 2019-02-07 DIAGNOSIS — R109 Unspecified abdominal pain: Secondary | ICD-10-CM | POA: Diagnosis not present

## 2019-02-07 DIAGNOSIS — I10 Essential (primary) hypertension: Secondary | ICD-10-CM | POA: Diagnosis not present

## 2019-02-07 DIAGNOSIS — W19XXXA Unspecified fall, initial encounter: Secondary | ICD-10-CM

## 2019-02-07 LAB — COMPREHENSIVE METABOLIC PANEL
ALT: 31 U/L (ref 0–44)
AST: 28 U/L (ref 15–41)
Albumin: 3.8 g/dL (ref 3.5–5.0)
Alkaline Phosphatase: 161 U/L — ABNORMAL HIGH (ref 38–126)
Anion gap: 11 (ref 5–15)
BUN: 9 mg/dL (ref 6–20)
CO2: 22 mmol/L (ref 22–32)
Calcium: 8.8 mg/dL — ABNORMAL LOW (ref 8.9–10.3)
Chloride: 108 mmol/L (ref 98–111)
Creatinine, Ser: 0.87 mg/dL (ref 0.44–1.00)
GFR calc Af Amer: >60 mL/min (ref >60)
GFR calc non Af Amer: >60 mL/min (ref >60)
Glucose, Bld: 107 mg/dL — ABNORMAL HIGH (ref 70–99)
Potassium: 3.7 mmol/L (ref 3.5–5.1)
Sodium: 141 mmol/L (ref 135–145)
Total Bilirubin: 0.6 mg/dL (ref 0.3–1.2)
Total Protein: 7.3 g/dL (ref 6.5–8.1)

## 2019-02-07 LAB — CBC WITH DIFFERENTIAL/PLATELET
Abs Immature Granulocytes: 0 10*3/uL (ref 0.00–0.07)
Basophils Absolute: 0.1 10*3/uL (ref 0.0–0.1)
Basophils Relative: 1 %
Eosinophils Absolute: 0.4 10*3/uL (ref 0.0–0.5)
Eosinophils Relative: 3 %
HCT: 39.8 % (ref 36.0–46.0)
Hemoglobin: 12 g/dL (ref 12.0–15.0)
Lymphocytes Relative: 45 %
Lymphs Abs: 5.3 10*3/uL — ABNORMAL HIGH (ref 0.7–4.0)
MCH: 23.9 pg — ABNORMAL LOW (ref 26.0–34.0)
MCHC: 30.2 g/dL (ref 30.0–36.0)
MCV: 79.3 fL — ABNORMAL LOW (ref 80.0–100.0)
Monocytes Absolute: 0.5 10*3/uL (ref 0.1–1.0)
Monocytes Relative: 4 %
Neutro Abs: 5.5 10*3/uL (ref 1.7–7.7)
Neutrophils Relative %: 47 %
Platelets: 294 10*3/uL (ref 150–400)
RBC: 5.02 MIL/uL (ref 3.87–5.11)
RDW: 17.5 % — ABNORMAL HIGH (ref 11.5–15.5)
WBC: 11.8 10*3/uL — ABNORMAL HIGH (ref 4.0–10.5)
nRBC: 0 % (ref 0.0–0.2)
nRBC: 0 /100 WBC

## 2019-02-07 LAB — BRAIN NATRIURETIC PEPTIDE: B Natriuretic Peptide: 10.1 pg/mL (ref 0.0–100.0)

## 2019-02-07 LAB — LIPASE, BLOOD: Lipase: 23 U/L (ref 11–51)

## 2019-02-07 LAB — TROPONIN I: Troponin I: <0.03 ng/mL (ref <0.03)

## 2019-02-07 MED ORDER — ONDANSETRON HCL 4 MG/2ML IJ SOLN
4.0000 mg | Freq: Once | INTRAMUSCULAR | Status: AC
  Administered 2019-02-07: 15:00:00 4 mg via INTRAVENOUS
  Filled 2019-02-07: qty 2

## 2019-02-07 MED ORDER — HYDROMORPHONE HCL 1 MG/ML IJ SOLN
1.0000 mg | Freq: Once | INTRAMUSCULAR | Status: AC
  Administered 2019-02-07: 18:00:00 1 mg via INTRAVENOUS
  Filled 2019-02-07: qty 1

## 2019-02-07 MED ORDER — IOHEXOL 300 MG/ML  SOLN
100.0000 mL | Freq: Once | INTRAMUSCULAR | Status: AC | PRN
  Administered 2019-02-07: 100 mL via INTRAVENOUS

## 2019-02-07 MED ORDER — HYDROMORPHONE HCL 1 MG/ML IJ SOLN
1.0000 mg | Freq: Once | INTRAMUSCULAR | Status: AC
  Administered 2019-02-07: 15:00:00 1 mg via INTRAVENOUS
  Filled 2019-02-07: qty 1

## 2019-02-07 MED ORDER — HEPARIN SOD (PORK) LOCK FLUSH 100 UNIT/ML IV SOLN
500.0000 [IU] | Freq: Once | INTRAVENOUS | Status: AC
  Administered 2019-02-07: 19:00:00 500 [IU]
  Filled 2019-02-07: qty 5

## 2019-02-07 NOTE — Discharge Instructions (Signed)
Fortunately, your labs and imaging today did not show any fractures or broken bones.   Of note, your CT scan did show a small cyst on your breast. This is likely benign but it is important to [place] [facility].

## 2019-02-07 NOTE — ED Notes (Signed)
Patient transported to CT 

## 2019-02-07 NOTE — ED Triage Notes (Signed)
Pt here after mechanical fall at [employer] this mornign. Now c/o R hip, neck, and head pain. Hx multiple surgeries on hip, residual shortening on R side. C collar in place. Pt has had productive cough, sore throat, abdominal pain and diarrhea without fever for a few days.
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
## 2019-02-07 NOTE — ED Notes (Signed)
Pt concerned that CT forgot them.  This RN called and CT stated they have sent transport to get the patient. Pt updated on plan of care.

## 2019-02-07 NOTE — ED Notes (Signed)
I wasn't able to get patient blood tried to get in Rt. AC I didn't have any success.

## 2019-02-07 NOTE — ED Notes (Signed)
Pt returned from CT °

## 2019-02-07 NOTE — ED Notes (Signed)
Phlebotomy came to patients room to draw blood. Pt would not let phlebotomy try and asked why blood couldn't be drawn from her port.

## 2019-02-07 NOTE — ED Notes (Signed)
IV team at bedside 

## 2019-02-07 NOTE — ED Notes (Signed)
Pt requesting more pain medication, EDP made aware

## 2019-02-07 NOTE — ED Notes (Signed)
ED Provider at bedside. 

## 2019-02-07 NOTE — ED Notes (Signed)
Pt states that she DID hit head and DID lose consciousness briefly

## 2019-02-07 NOTE — ED Provider Notes (Signed)
Assumed care from Dr. Melina Copa at 6:11 PM. Briefly, the patient is a 53 y.o. female with PMHx of  has a past medical history of Anemia, Ankle syndesmosis disruption, left, initial encounter, Anxiety, Arthritis, Asthma, Avascular necrosis ([place]), Cellulitis (09/06/2016), Chronic bronchitis ([place]), Complication of anesthesia, DVT (deep venous thrombosis) ([place]) (2012; 2016), Edema (04/26/2016), GERD (gastroesophageal reflux disease), Heart murmur, Hematuria, History of blood transfusion, History of kidney stones, History of right common carotid artery stent placement, Hypertension, Lupus ([place]), Mass of right breast, Migraine, Peripheral vascular disease ([place]), Pneumonia (1981; 1990), PTSD (post-traumatic stress disorder) (2002), Pulmonary embolism ([place]) (2016), Pyelonephritis, Recurrent UTI (urinary tract infection), Seizures ([place]) (1981; 08/2015), and Uterine cancer ([place]) (1997). here with fall. [place] [employer] [place]. No major trauma on exam, plan to f/u imaging and d/c. Pt also wth multiple complaints likely viral. No PNA on CXR. Labs @ baseline.  Labs Reviewed  COMPREHENSIVE METABOLIC PANEL - Abnormal; Notable for the following components:      Result Value   Glucose, Bld 107 (*)    Calcium 8.8 (*)    Alkaline Phosphatase 161 (*)    All other components within normal limits  CBC WITH DIFFERENTIAL/PLATELET - Abnormal; Notable for the following components:   WBC 11.8 (*)    MCV 79.3 (*)    MCH 23.9 (*)    RDW 17.5 (*)    Lymphs Abs 5.3 (*)    All other components within normal limits  TROPONIN I  LIPASE, BLOOD  BRAIN NATRIURETIC PEPTIDE    Course of Care: -Imaging neg for acute abnormality. Will treat with analgesia here, symptomatic control for her other sx, and d/c as per Dr. Melina Copa. Pt feels better, is in agreement with this plan on my assessment.     Duffy Bruce, MD 02/07/19 [phone]

## 2019-02-15 ENCOUNTER — Other Ambulatory Visit: Payer: Self-pay | Admitting: Internal Medicine

## 2019-02-15 DIAGNOSIS — M329 Systemic lupus erythematosus, unspecified: Secondary | ICD-10-CM

## 2019-02-20 ENCOUNTER — Other Ambulatory Visit: Payer: Self-pay | Admitting: Internal Medicine

## 2019-02-21 IMAGING — CT CT HIP*R* W/O CM
2 of 3 series · 17 of 46 positions shown, 19 images · non-contrast
Comparison: CT 10/29/2016

CLINICAL DATA: Right hip pain. Fall 4 days prior. History of
multiple surgeries and chronic osteomyelitis.

EXAM:
CT OF THE RIGHT HIP WITHOUT CONTRAST
TECHNIQUE: Multidetector CT imaging of the right hip was performed according to
the standard protocol. Multiplanar CT image reconstructions were
also generated.

[Series 6: hip 2.0 st · axial · 0.53mm/px · z∈[+800,+1048]mm · 14 of 144 slices shown, 16 images]
[im 10/144  soft-tissue]
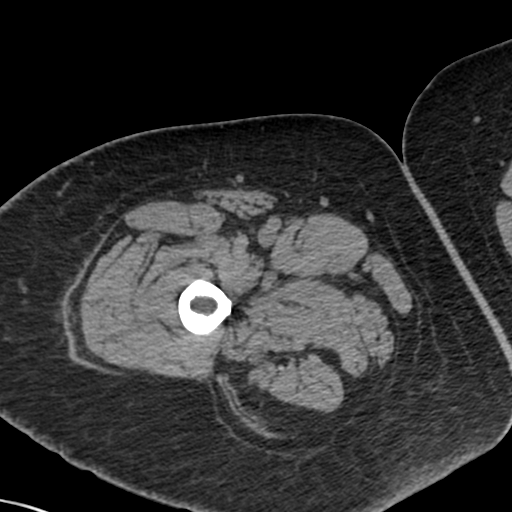
[im 10/144  bone]
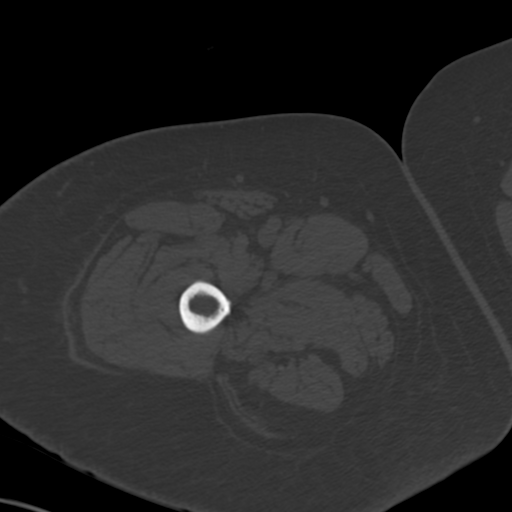
[im 19/144  soft-tissue]
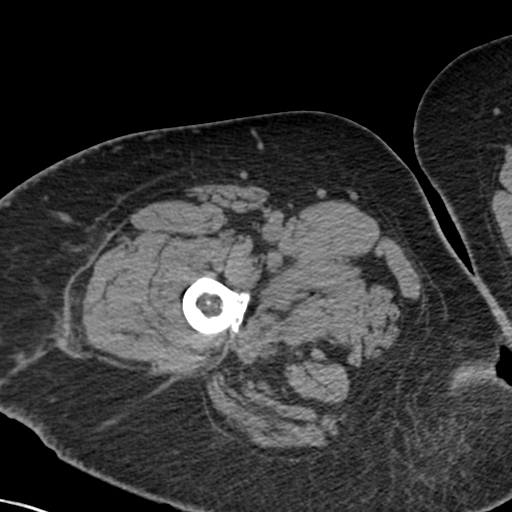
[im 28/144  soft-tissue]
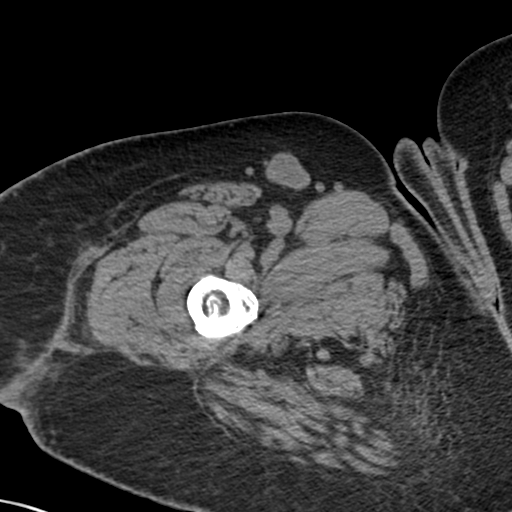
[im 37/144  soft-tissue]
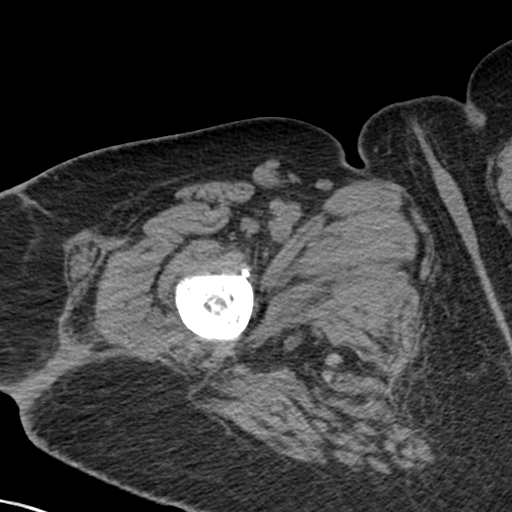
[im 47/144  soft-tissue]
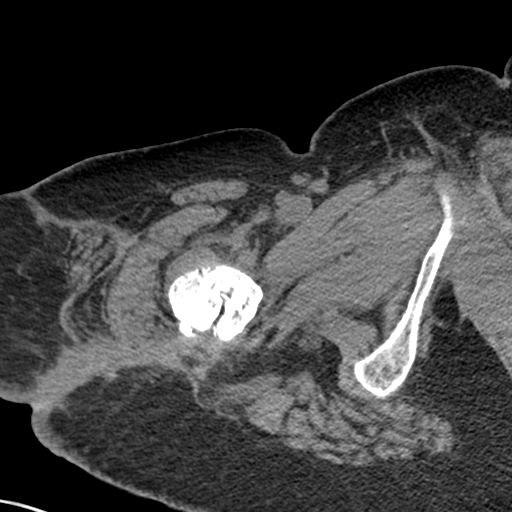
[im 56/144  soft-tissue]
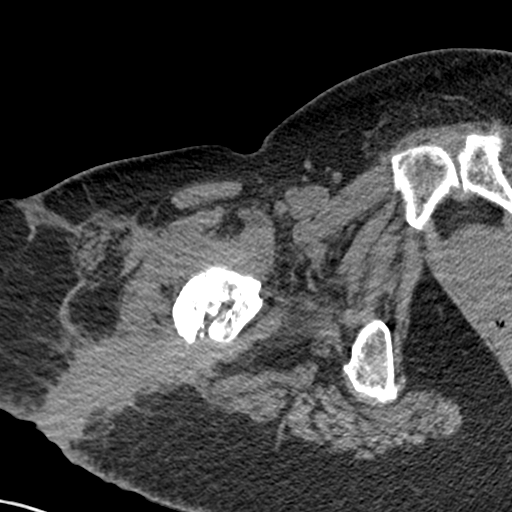
[im 65/144  soft-tissue]
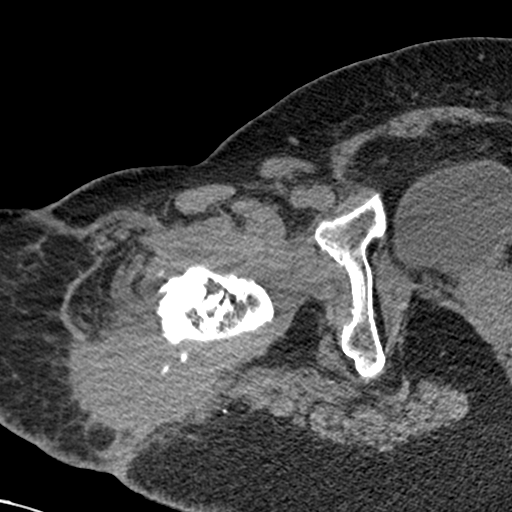
[im 79/144  soft-tissue]
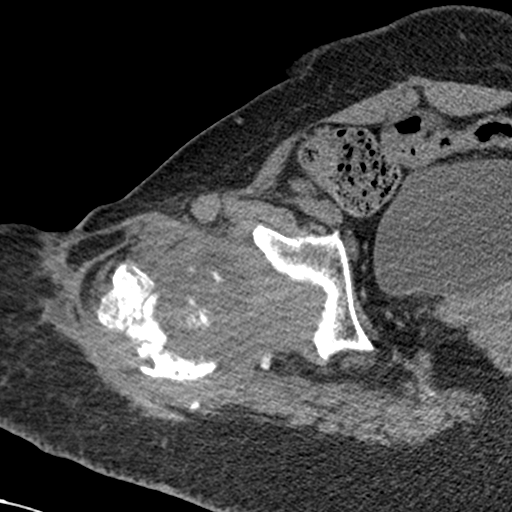
[im 88/144  soft-tissue]
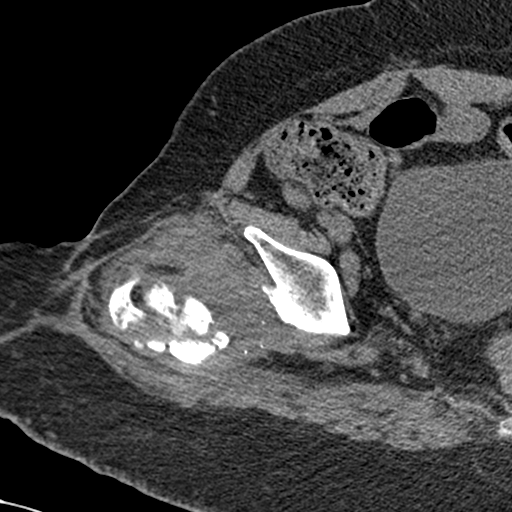
[im 88/144  bone]
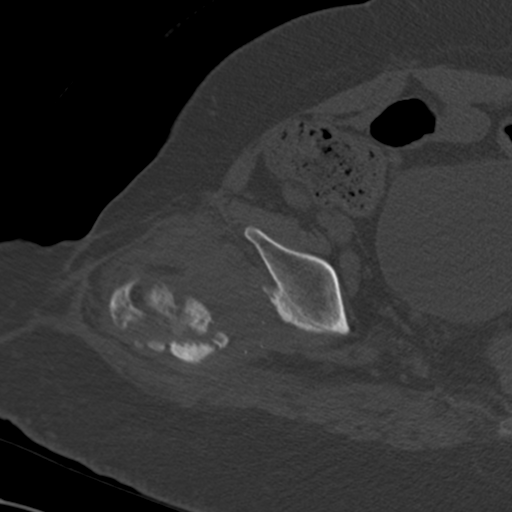
[im 97/144  soft-tissue]
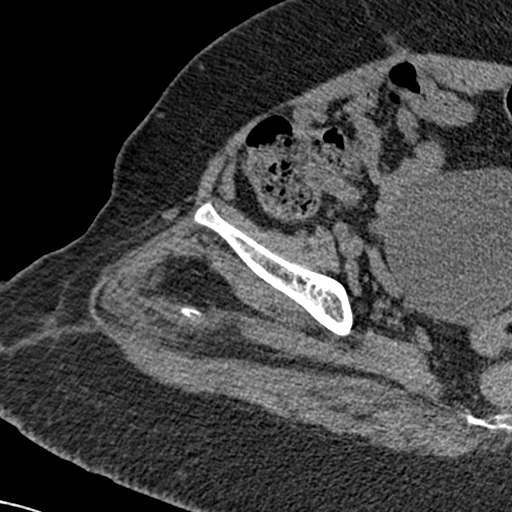
[im 107/144  soft-tissue]
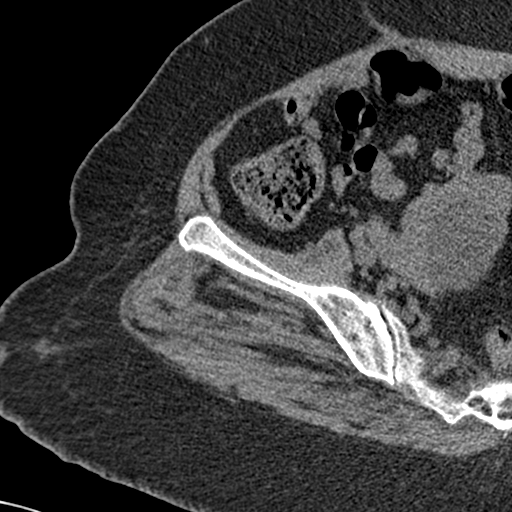
[im 116/144  soft-tissue]
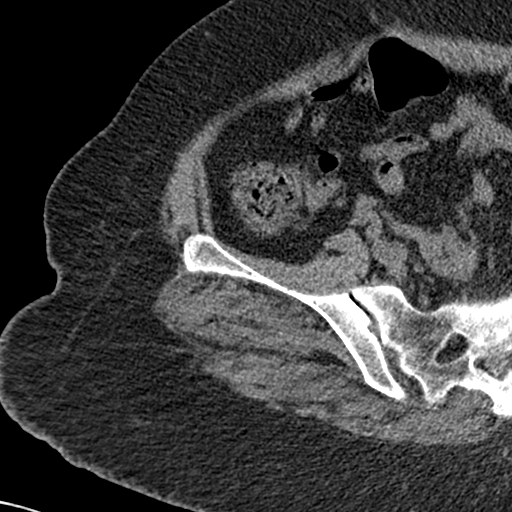
[im 125/144  soft-tissue]
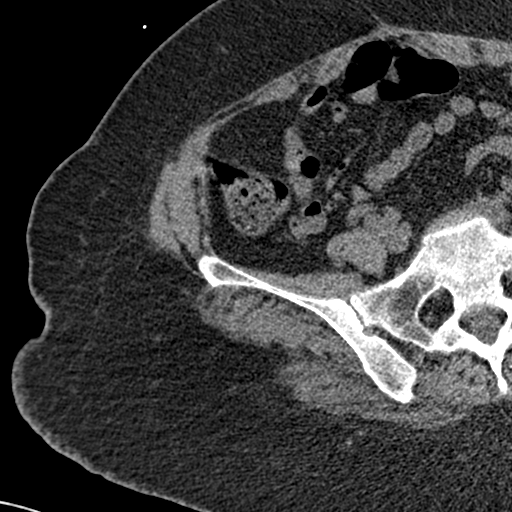
[im 134/144  soft-tissue]
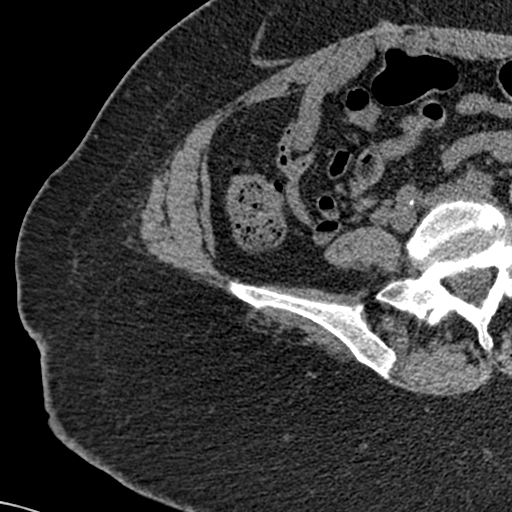

[Series 9: hip 2.0 cor. st · coronal · 0.60mm/px · 3 of 141 slices shown]
[im 47/141  soft-tissue]
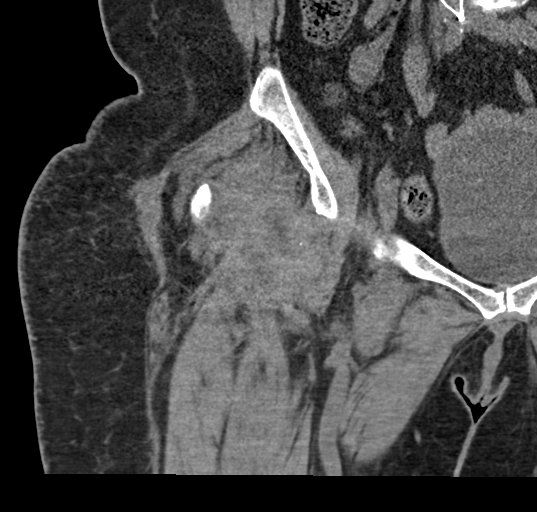
[im 63/141  soft-tissue]
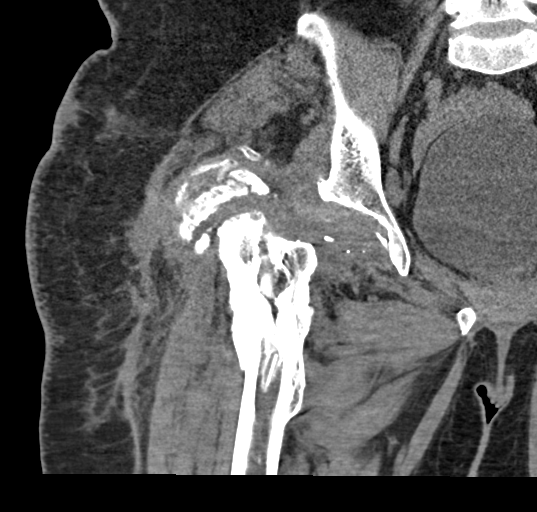
[im 78/141  soft-tissue]
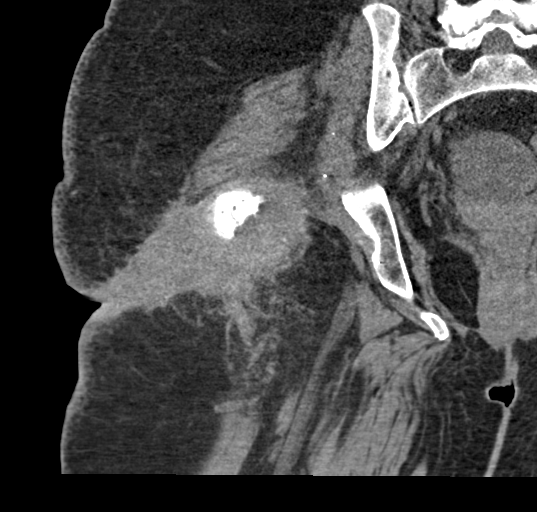

[17 of 46 positions shown; findings below may reference images not displayed]

FINDINGS: Bones/Joint/Cartilage

No acute fracture. Zeinab Tiger procedure with unchanged
appearance of fragmentation of the proximal femoral shaft. Unchanged
appearance of the acetabular joint space with irregular
heterogeneous soft tissue density and osseous fragmentation.

Ligaments

Suboptimally assessed by CT.

Muscles and Tendons

No intramuscular fluid collection.

Soft tissues

The previous peripherally calcified fluid collection lateral to the
femoral shaft has decreased with residual soft tissue density. No
new fluid collection. No evidence of acute soft tissue hematoma or
stranding. Prominent lymph nodes in the inguinal and external iliac
chains are stable.
IMPRESSION: 1. No evidence of acute abnormality.
2. Postsurgical and chronic changes about the hip joint and upper
femur, sequela of chronic osteomyelitis. The previous fluid
collection adjacent to the lateral femoral shaft has resolve with
residual soft tissue density likely scarring.

## 2019-02-22 ENCOUNTER — Ambulatory Visit (INDEPENDENT_AMBULATORY_CARE_PROVIDER_SITE_OTHER): Payer: Medicare HMO | Admitting: Internal Medicine

## 2019-02-22 ENCOUNTER — Other Ambulatory Visit: Payer: Self-pay

## 2019-02-22 DIAGNOSIS — I82409 Acute embolism and thrombosis of unspecified deep veins of unspecified lower extremity: Secondary | ICD-10-CM | POA: Diagnosis not present

## 2019-02-22 DIAGNOSIS — M8668 Other chronic osteomyelitis, other site: Secondary | ICD-10-CM

## 2019-02-22 DIAGNOSIS — M899 Disorder of bone, unspecified: Secondary | ICD-10-CM | POA: Diagnosis not present

## 2019-02-22 DIAGNOSIS — R911 Solitary pulmonary nodule: Secondary | ICD-10-CM | POA: Diagnosis not present

## 2019-02-22 DIAGNOSIS — Z95828 Presence of other vascular implants and grafts: Secondary | ICD-10-CM

## 2019-02-22 DIAGNOSIS — M7989 Other specified soft tissue disorders: Secondary | ICD-10-CM

## 2019-02-22 DIAGNOSIS — M329 Systemic lupus erythematosus, unspecified: Secondary | ICD-10-CM | POA: Diagnosis not present

## 2019-02-22 DIAGNOSIS — E041 Nontoxic single thyroid nodule: Secondary | ICD-10-CM

## 2019-02-22 DIAGNOSIS — N6310 Unspecified lump in the right breast, unspecified quadrant: Secondary | ICD-10-CM | POA: Diagnosis not present

## 2019-02-22 DIAGNOSIS — J45909 Unspecified asthma, uncomplicated: Secondary | ICD-10-CM

## 2019-02-22 DIAGNOSIS — I10 Essential (primary) hypertension: Secondary | ICD-10-CM

## 2019-02-22 DIAGNOSIS — M898X9 Other specified disorders of bone, unspecified site: Secondary | ICD-10-CM
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
## 2019-02-23 NOTE — Assessment & Plan Note (Signed)
Ct chest from July 2019 showed a left upper lobe pulmonary nodule that is 71mm in size. She will need a repeat CT chest ordered in May 2020.

## 2019-02-23 NOTE — Assessment & Plan Note (Signed)
The patient's blood pressure was not able to be assessed at this visit. The patient is currently taking amlodipine 5mg  qd. His last blood pressure visits are   BP Readings from Last 3 Encounters:  02/07/19 131/79  11/23/18 (!) 119/94  08/03/18 129/61   Assessment and Plan Recommend continuing amlodipine 5mg  qd till the patient's blood pressure can be reassessed.

## 2019-02-24 DIAGNOSIS — C801 Malignant (primary) neoplasm, unspecified: Secondary | ICD-10-CM | POA: Diagnosis not present

## 2019-02-24 DIAGNOSIS — M8668 Other chronic osteomyelitis, other site: Secondary | ICD-10-CM | POA: Diagnosis not present

## 2019-02-24 DIAGNOSIS — R52 Pain, unspecified: Secondary | ICD-10-CM | POA: Diagnosis not present

## 2019-02-24 DIAGNOSIS — S82892D Other fracture of left lower leg, subsequent encounter for closed fracture with routine healing: Secondary | ICD-10-CM | POA: Diagnosis not present

## 2019-02-25 ENCOUNTER — Ambulatory Visit: Payer: Medicare HMO

## 2019-02-25 DIAGNOSIS — I82409 Acute embolism and thrombosis of unspecified deep veins of unspecified lower extremity: Secondary | ICD-10-CM | POA: Insufficient documentation

## 2019-02-25 DIAGNOSIS — M7989 Other specified soft tissue disorders: Secondary | ICD-10-CM | POA: Insufficient documentation

## 2019-02-25 NOTE — Assessment & Plan Note (Signed)
Currently does not have a rheumatologist.  Patient is scheduled to see Dr. Cora Collum office.  Placed a new referral for rheumatology and was told to go see Leafy Kindle, PA.  I reminded patient to call their clinic at [phone]. She mentioned that an appointment that is scheduled for May.   Patient is currently taking prednisone 5 mg daily, hydroxychloroquine 200 mg twice daily which I told her to continue till appointment.

## 2019-02-25 NOTE — Assessment & Plan Note (Signed)
Located in the 8 o'clock position of right breast found on ultrasound of right breast and mammogram of breasts. Recommended repeat mammogram in 1 year

## 2019-02-25 NOTE — Assessment & Plan Note (Signed)
Patient has been lost to follow up with hematology, will discuss with Dr. Beryle Beams and also place referral to hem/onc.

## 2019-02-25 NOTE — Assessment & Plan Note (Signed)
Patient had free T4=1.26 and TSH 1.290 in January 2020. Will schedule thyroid ultrasound after covid pandemic settles.

## 2019-02-25 NOTE — Assessment & Plan Note (Signed)
Patient stated that she was in pain due to her bilateral legs swelling. She felt that there was a lot of fluid buildup. Patient was started on lasix in June 2017 due to significant extremity swelling and decreased urine output.   Patient states that she has not been adherent to her lasix regimen and has gained weight.   UA done in 2018 did not show any proteinuria. Last TTE from May 2018 showed ef 65-70%, g1dd, calcified mitral anulus, no regional wall motion abnormalities, no pericardial effusion.   Assessment and plan  Unable to assess patient over phone and thereby Instructed patient to come into [facility] to be evaluated regarding her volume status on Monday 02/25/19. She was told to continue her lasix in the interim.    -Will need repeat UA during [facility] visit  -Physical exam for volume status  -TTE ordered  -Lower extremity doppler for possible dvt especially due to history

## 2019-02-25 NOTE — Assessment & Plan Note (Addendum)
Referral made to ID to follow up on osteomyelitis.   -continue amoxicilin

## 2019-02-25 NOTE — Assessment & Plan Note (Signed)
CT cervical spine in July 2019 showed multiple lucent lesions in cervical spine concerning for metastatic disease. This was the first time these lesions were seen.   Repeat CT cervical spine done in April 2020 showed "Subcentimeter lucencies in cervical and upper thoracic spine including 26m lesion in left lateral mass of c1".  Assessment and plan  As the lesions have remained stable for the past year, it is unlikely that these are from metastatic disease. It is possible that she has some inflammatory changes from infection, inflammatory changes from rheumatologic process, or from a localized bony overgrowth.   Multiple myeloma workup with ife and kappa/lamda chain was unremarkable.   Will continue to monitor and will discuss with Dr. GBeryle Beams

## 2019-02-26 ENCOUNTER — Ambulatory Visit (HOSPITAL_COMMUNITY)
Admission: RE | Admit: 2019-02-26 | Discharge: 2019-02-26 | Disposition: A | Discharge status: Home / Self Care | Payer: Medicare HMO | Source: Ambulatory Visit | Attending: Internal Medicine | Admitting: Internal Medicine

## 2019-02-26 ENCOUNTER — Other Ambulatory Visit: Payer: Self-pay

## 2019-02-26 ENCOUNTER — Ambulatory Visit (INDEPENDENT_AMBULATORY_CARE_PROVIDER_SITE_OTHER): Payer: Medicare HMO | Admitting: Internal Medicine

## 2019-02-26 VITALS — BP 160/89 | HR 95 | Temp 98.4°F | Wt 273.9 lb

## 2019-02-26 DIAGNOSIS — Z95828 Presence of other vascular implants and grafts: Secondary | ICD-10-CM

## 2019-02-26 DIAGNOSIS — M7989 Other specified soft tissue disorders: Secondary | ICD-10-CM | POA: Diagnosis not present

## 2019-02-26 DIAGNOSIS — Z86718 Personal history of other venous thrombosis and embolism: Secondary | ICD-10-CM

## 2019-02-26 DIAGNOSIS — I82409 Acute embolism and thrombosis of unspecified deep veins of unspecified lower extremity: Secondary | ICD-10-CM

## 2019-02-26 DIAGNOSIS — Z6841 Body Mass Index (BMI) 40.0 and over, adult: Secondary | ICD-10-CM | POA: Diagnosis not present
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
## 2019-02-26 NOTE — Assessment & Plan Note (Addendum)
HPI:  Patient presenting to the clinic with five days of unilateral lower extremity swelling. She states that is very painful. Today swelling is less than it has been in the past. 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 She is not sure why she is not on anticoagulation but thinks that it may have been related to a bleed. She is not taking any long car rides. She has never been told why she has recurrent blood clots. On physical exam her right calf is >1cm larger than her left.   A/P: - Concern for recurrent DVT. STAT LE doppler of the right leg  ADDENDUM: DVT study negative. Will treat empirically for nonpurulent cellulitis with cephalexin.

## 2019-02-26 NOTE — Progress Notes (Signed)
Medicine attending: Medical history, presenting problems, physical complaints, and medications, reviewed with resident physician Dr Lars Mage on the day of the patient telephone consultation and I concur with her evaluation and management plan. Complex pt: hx chronic C3-4 osteomyelitis on chronic ampicillin; Lupus on chronic prednisone & plaquenil; thyroid nodule under surveillance. "lytic lesion" spine noted 1 year ago. Suspect related to bone involvement w osteomyelitis & not reflective of malignancy. Normal SPEP. Dr C to review record in more detail. Additional imaging if indicated. Pt needs in person office visit soon.

## 2019-02-26 NOTE — Progress Notes (Signed)
Right lower extremity venous duplex completed. Preliminary esults in Chart review CV Proc. [facility], [place] 02/26/2019 4:26 PM

## 2019-02-26 NOTE — Patient Instructions (Signed)
Thank you for allowing [place] to provide your care. Today we are getting lower extremity Doppler's to make sure you do not have a blood clot. I will call you with the results.

## 2019-02-27 MED ORDER — CYCLOBENZAPRINE HCL 10 MG PO TABS: 10.0000 mg | ORAL_TABLET | Freq: Three times a day (TID) | ORAL | 0 refills | Status: AC

## 2019-02-27 MED ORDER — CEPHALEXIN 500 MG PO CAPS: 500.0000 mg | ORAL_CAPSULE | Freq: Four times a day (QID) | ORAL | 0 refills | Status: AC

## 2019-02-27 NOTE — Addendum Note (Signed)
Addended byIna Homes T on: 02/27/2019 08:01 AM   Modules accepted: Orders

## 2019-02-27 NOTE — Addendum Note (Signed)
Addended byIna Homes T on: 02/27/2019 08:03 AM   Modules accepted: Orders

## 2019-02-27 NOTE — Progress Notes (Signed)
Internal Medicine Clinic Attending  Case discussed with Dr. Helberg at the time of the visit.  We reviewed the resident's history and exam and pertinent patient test results.  I agree with the assessment, diagnosis, and plan of care documented in the resident's note.    

## 2019-03-04 DIAGNOSIS — S8266XS Nondisplaced fracture of lateral malleolus of unspecified fibula, sequela: Secondary | ICD-10-CM | POA: Diagnosis not present

## 2019-03-04 DIAGNOSIS — C50119 Malignant neoplasm of central portion of unspecified female breast: Secondary | ICD-10-CM | POA: Diagnosis not present

## 2019-03-04 DIAGNOSIS — G894 Chronic pain syndrome: Secondary | ICD-10-CM | POA: Diagnosis not present

## 2019-03-04 DIAGNOSIS — M79672 Pain in left foot: Secondary | ICD-10-CM | POA: Diagnosis not present

## 2019-03-04 DIAGNOSIS — M79604 Pain in right leg: Secondary | ICD-10-CM | POA: Diagnosis not present

## 2019-03-04 DIAGNOSIS — C7951 Secondary malignant neoplasm of bone: Secondary | ICD-10-CM | POA: Diagnosis not present

## 2019-03-04 DIAGNOSIS — Z79891 Long term (current) use of opiate analgesic: Secondary | ICD-10-CM | POA: Diagnosis not present

## 2019-03-05 NOTE — Addendum Note (Signed)
Addended by: Hulan Fray on: 03/05/2019 01:59 PM   Modules accepted: Orders

## 2019-03-14 NOTE — Telephone Encounter (Signed)
done

## 2019-03-26 ENCOUNTER — Ambulatory Visit: Payer: Medicare HMO | Admitting: Internal Medicine

## 2019-03-26 ENCOUNTER — Other Ambulatory Visit: Payer: Self-pay

## 2019-03-26 DIAGNOSIS — C801 Malignant (primary) neoplasm, unspecified: Secondary | ICD-10-CM | POA: Diagnosis not present

## 2019-03-26 DIAGNOSIS — R52 Pain, unspecified: Secondary | ICD-10-CM | POA: Diagnosis not present

## 2019-03-26 DIAGNOSIS — M8668 Other chronic osteomyelitis, other site: Secondary | ICD-10-CM | POA: Diagnosis not present

## 2019-03-26 DIAGNOSIS — I10 Essential (primary) hypertension: Secondary | ICD-10-CM

## 2019-03-26 DIAGNOSIS — S82892D Other fracture of left lower leg, subsequent encounter for closed fracture with routine healing: Secondary | ICD-10-CM | POA: Diagnosis not present

## 2019-03-26 NOTE — Progress Notes (Signed)
Did not do telehealth visit as following patient should be scheduled for a inpatient visit next Tuesday, April 02, 2019.  The patient has multiple chronic problems that need to be addressed in person.  Lars Mage, MD Internal Medicine PGY2 SMOLM:[phone] 03/26/2019, 3:46 PM

## 2019-03-27 ENCOUNTER — Encounter: Payer: Medicare HMO | Admitting: Internal Medicine

## 2019-03-29 ENCOUNTER — Encounter: Payer: Medicare HMO | Admitting: Internal Medicine

## 2019-04-01 DIAGNOSIS — Z79891 Long term (current) use of opiate analgesic: Secondary | ICD-10-CM | POA: Diagnosis not present

## 2019-04-01 DIAGNOSIS — M79672 Pain in left foot: Secondary | ICD-10-CM | POA: Diagnosis not present

## 2019-04-01 DIAGNOSIS — S8266XS Nondisplaced fracture of lateral malleolus of unspecified fibula, sequela: Secondary | ICD-10-CM | POA: Diagnosis not present

## 2019-04-01 DIAGNOSIS — M79604 Pain in right leg: Secondary | ICD-10-CM | POA: Diagnosis not present

## 2019-04-01 DIAGNOSIS — C7951 Secondary malignant neoplasm of bone: Secondary | ICD-10-CM | POA: Diagnosis not present

## 2019-04-01 DIAGNOSIS — C50119 Malignant neoplasm of central portion of unspecified female breast: Secondary | ICD-10-CM | POA: Diagnosis not present

## 2019-04-01 DIAGNOSIS — G894 Chronic pain syndrome: Secondary | ICD-10-CM | POA: Diagnosis not present

## 2019-04-09 ENCOUNTER — Encounter: Payer: Medicare HMO | Admitting: Internal Medicine
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
## 2019-04-16 ENCOUNTER — Encounter: Payer: Medicare HMO | Admitting: Internal Medicine
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
## 2019-04-29 ENCOUNTER — Other Ambulatory Visit: Payer: Self-pay | Admitting: Internal Medicine

## 2019-04-29 DIAGNOSIS — G894 Chronic pain syndrome: Secondary | ICD-10-CM | POA: Diagnosis not present

## 2019-04-29 DIAGNOSIS — C7951 Secondary malignant neoplasm of bone: Secondary | ICD-10-CM | POA: Diagnosis not present

## 2019-04-29 DIAGNOSIS — M79672 Pain in left foot: Secondary | ICD-10-CM | POA: Diagnosis not present

## 2019-04-29 DIAGNOSIS — C50119 Malignant neoplasm of central portion of unspecified female breast: Secondary | ICD-10-CM | POA: Diagnosis not present

## 2019-04-29 DIAGNOSIS — S8266XS Nondisplaced fracture of lateral malleolus of unspecified fibula, sequela: Secondary | ICD-10-CM | POA: Diagnosis not present

## 2019-04-29 DIAGNOSIS — M79604 Pain in right leg: Secondary | ICD-10-CM | POA: Diagnosis not present

## 2019-04-29 DIAGNOSIS — Z79891 Long term (current) use of opiate analgesic: Secondary | ICD-10-CM | POA: Diagnosis not present

## 2019-05-02 ENCOUNTER — Encounter: Payer: Self-pay | Admitting: *Deleted

## 2019-05-23 ENCOUNTER — Other Ambulatory Visit: Payer: Self-pay | Admitting: Internal Medicine

## 2019-05-23 DIAGNOSIS — M329 Systemic lupus erythematosus, unspecified: Secondary | ICD-10-CM

## 2019-05-24 ENCOUNTER — Other Ambulatory Visit: Payer: Self-pay | Admitting: Internal Medicine

## 2019-05-24 NOTE — Telephone Encounter (Signed)
Patient has missed several follow ups and should be seen in [facility] for an in patient visit to follow her lower extremity swelling. She needs to have a monthly visit with [place] please. Thanks!

## 2019-05-27 DIAGNOSIS — M79604 Pain in right leg: Secondary | ICD-10-CM | POA: Diagnosis not present

## 2019-05-27 DIAGNOSIS — Z79891 Long term (current) use of opiate analgesic: Secondary | ICD-10-CM | POA: Diagnosis not present

## 2019-05-27 DIAGNOSIS — C7951 Secondary malignant neoplasm of bone: Secondary | ICD-10-CM | POA: Diagnosis not present

## 2019-05-27 DIAGNOSIS — S8266XS Nondisplaced fracture of lateral malleolus of unspecified fibula, sequela: Secondary | ICD-10-CM | POA: Diagnosis not present

## 2019-05-27 DIAGNOSIS — C50119 Malignant neoplasm of central portion of unspecified female breast: Secondary | ICD-10-CM | POA: Diagnosis not present

## 2019-05-27 DIAGNOSIS — G894 Chronic pain syndrome: Secondary | ICD-10-CM | POA: Diagnosis not present

## 2019-05-27 DIAGNOSIS — M79672 Pain in left foot: Secondary | ICD-10-CM | POA: Diagnosis not present

## 2019-06-02 ENCOUNTER — Other Ambulatory Visit: Payer: Self-pay | Admitting: Internal Medicine

## 2019-06-04 ENCOUNTER — Encounter: Payer: Medicare HMO | Admitting: Internal Medicine

## 2019-06-15 ENCOUNTER — Other Ambulatory Visit: Payer: Self-pay | Admitting: Internal Medicine

## 2019-06-24 DIAGNOSIS — S8266XS Nondisplaced fracture of lateral malleolus of unspecified fibula, sequela: Secondary | ICD-10-CM | POA: Diagnosis not present

## 2019-06-24 DIAGNOSIS — M79604 Pain in right leg: Secondary | ICD-10-CM | POA: Diagnosis not present

## 2019-06-24 DIAGNOSIS — G894 Chronic pain syndrome: Secondary | ICD-10-CM | POA: Diagnosis not present

## 2019-06-24 DIAGNOSIS — C7951 Secondary malignant neoplasm of bone: Secondary | ICD-10-CM | POA: Diagnosis not present

## 2019-06-24 DIAGNOSIS — C50119 Malignant neoplasm of central portion of unspecified female breast: Secondary | ICD-10-CM | POA: Diagnosis not present

## 2019-06-24 DIAGNOSIS — M79672 Pain in left foot: Secondary | ICD-10-CM | POA: Diagnosis not present

## 2019-06-24 DIAGNOSIS — Z79891 Long term (current) use of opiate analgesic: Secondary | ICD-10-CM | POA: Diagnosis not present

## 2019-06-28 ENCOUNTER — Other Ambulatory Visit: Payer: Self-pay | Admitting: Internal Medicine

## 2019-07-01 NOTE — Telephone Encounter (Signed)
Call made to pharmacy-pt received duloxetine 30 #90 on 05/24/19.  Per pharmacy this is a "proactive refill request" and system automatically sends out refill request when rx has no remaining refills-additional refills will be added to pt's profile and pharmacy will refill rx when due.  Will send to pcp for review, please advise.Despina Hidden Cassady8/31/20201:25 PM
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
## 2019-07-16 ENCOUNTER — Encounter: Payer: Medicare HMO | Admitting: Internal Medicine
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
## 2019-07-22 DIAGNOSIS — C50119 Malignant neoplasm of central portion of unspecified female breast: Secondary | ICD-10-CM | POA: Diagnosis not present

## 2019-07-22 DIAGNOSIS — M79672 Pain in left foot: Secondary | ICD-10-CM | POA: Diagnosis not present

## 2019-07-22 DIAGNOSIS — S8266XS Nondisplaced fracture of lateral malleolus of unspecified fibula, sequela: Secondary | ICD-10-CM | POA: Diagnosis not present

## 2019-07-22 DIAGNOSIS — C7951 Secondary malignant neoplasm of bone: Secondary | ICD-10-CM | POA: Diagnosis not present

## 2019-07-22 DIAGNOSIS — M79604 Pain in right leg: Secondary | ICD-10-CM | POA: Diagnosis not present

## 2019-07-22 DIAGNOSIS — G894 Chronic pain syndrome: Secondary | ICD-10-CM | POA: Diagnosis not present

## 2019-07-22 DIAGNOSIS — Z79891 Long term (current) use of opiate analgesic: Secondary | ICD-10-CM | POA: Diagnosis not present

## 2019-07-23 ENCOUNTER — Encounter: Payer: Medicare HMO | Admitting: Internal Medicine

## 2019-07-26 ENCOUNTER — Other Ambulatory Visit: Payer: Self-pay | Admitting: Internal Medicine

## 2019-07-26 DIAGNOSIS — M329 Systemic lupus erythematosus, unspecified: Secondary | ICD-10-CM

## 2019-08-05 ENCOUNTER — Ambulatory Visit: Payer: Medicare HMO

## 2019-08-07 ENCOUNTER — Encounter: Payer: Self-pay | Admitting: Internal Medicine

## 2019-08-07 ENCOUNTER — Ambulatory Visit: Payer: Medicare HMO

## 2019-08-16 DIAGNOSIS — G894 Chronic pain syndrome: Secondary | ICD-10-CM | POA: Diagnosis not present

## 2019-08-16 DIAGNOSIS — C50119 Malignant neoplasm of central portion of unspecified female breast: Secondary | ICD-10-CM | POA: Diagnosis not present

## 2019-08-16 DIAGNOSIS — M79604 Pain in right leg: Secondary | ICD-10-CM | POA: Diagnosis not present

## 2019-08-16 DIAGNOSIS — Z79891 Long term (current) use of opiate analgesic: Secondary | ICD-10-CM | POA: Diagnosis not present

## 2019-08-16 DIAGNOSIS — C7951 Secondary malignant neoplasm of bone: Secondary | ICD-10-CM | POA: Diagnosis not present

## 2019-08-16 DIAGNOSIS — S8266XS Nondisplaced fracture of lateral malleolus of unspecified fibula, sequela: Secondary | ICD-10-CM | POA: Diagnosis not present

## 2019-08-16 DIAGNOSIS — M79672 Pain in left foot: Secondary | ICD-10-CM | POA: Diagnosis not present

## 2019-08-21 DIAGNOSIS — G8929 Other chronic pain: Secondary | ICD-10-CM | POA: Diagnosis not present

## 2019-08-21 DIAGNOSIS — K219 Gastro-esophageal reflux disease without esophagitis: Secondary | ICD-10-CM | POA: Diagnosis not present

## 2019-08-21 DIAGNOSIS — M1990 Unspecified osteoarthritis, unspecified site: Secondary | ICD-10-CM | POA: Diagnosis not present

## 2019-08-21 DIAGNOSIS — J45909 Unspecified asthma, uncomplicated: Secondary | ICD-10-CM | POA: Diagnosis not present

## 2019-08-21 DIAGNOSIS — M792 Neuralgia and neuritis, unspecified: Secondary | ICD-10-CM | POA: Diagnosis not present

## 2019-08-21 DIAGNOSIS — M329 Systemic lupus erythematosus, unspecified: Secondary | ICD-10-CM | POA: Diagnosis not present

## 2019-08-21 DIAGNOSIS — M62838 Other muscle spasm: Secondary | ICD-10-CM | POA: Diagnosis not present

## 2019-08-21 DIAGNOSIS — G4700 Insomnia, unspecified: Secondary | ICD-10-CM | POA: Diagnosis not present

## 2019-08-21 DIAGNOSIS — R69 Illness, unspecified: Secondary | ICD-10-CM | POA: Diagnosis not present

## 2019-08-21 DIAGNOSIS — Z008 Encounter for other general examination: Secondary | ICD-10-CM | POA: Diagnosis not present

## 2019-08-23 ENCOUNTER — Encounter: Payer: Self-pay | Admitting: Internal Medicine

## 2019-08-23 ENCOUNTER — Other Ambulatory Visit: Payer: Self-pay

## 2019-08-23 ENCOUNTER — Ambulatory Visit (INDEPENDENT_AMBULATORY_CARE_PROVIDER_SITE_OTHER): Payer: Medicare HMO | Admitting: Internal Medicine

## 2019-08-23 VITALS — BP 162/85 | HR 78 | Temp 98.3°F | Ht 66.0 in | Wt 271.8 lb

## 2019-08-23 DIAGNOSIS — R5383 Other fatigue: Secondary | ICD-10-CM | POA: Diagnosis not present

## 2019-08-23 DIAGNOSIS — M329 Systemic lupus erythematosus, unspecified: Secondary | ICD-10-CM

## 2019-08-23 DIAGNOSIS — Z9181 History of falling: Secondary | ICD-10-CM

## 2019-08-23 DIAGNOSIS — Z79899 Other long term (current) drug therapy: Secondary | ICD-10-CM | POA: Diagnosis not present

## 2019-08-23 DIAGNOSIS — I10 Essential (primary) hypertension: Secondary | ICD-10-CM | POA: Diagnosis not present

## 2019-08-23 MED ORDER — LISINOPRIL 20 MG PO TABS: 20.0000 mg | ORAL_TABLET | Freq: Every day | ORAL | 0 refills | Status: DC

## 2019-08-23 MED ORDER — HYDROXYCHLOROQUINE SULFATE 200 MG PO TABS: 200.0000 mg | ORAL_TABLET | Freq: Two times a day (BID) | ORAL | 0 refills | Status: DC

## 2019-08-23 NOTE — Patient Instructions (Signed)
Thank you for allowing [place] to provide your care. Today we discussed:  1) Fatigue / Falls: Your falls are due to your fatigue. Fatigue is often a multifactorial. In your case I think it is likely from the emotional stress and the physical pain you are in. Marya Amsler gonna start prednisone 40 mg per seven days to help with her joint pain. I have placed a referral to our counselor Dessie Coma. She should be calling you within the next 1 to 2 weeks to schedule an appointment.  2) Hypertension: For your high blood pressure we're gonna start you on lisinopril 20 mg daily. We are also gonna check some lab work including urine and blood. I will need you to come back in four weeks for repeat labs.  Please come back in four weeks for additional blood work and then in three months to see her primary care doctor, Dr. Linda Hedges.

## 2019-08-23 NOTE — Assessment & Plan Note (Signed)
Patient with known SLE. She is not seen her rheumatologist in approximately one year. They will not refill her hydroxychloroquine until they have seen her. She is currently out of her medication. We will prescribed three months and she is encouraged to schedule an appointment with her rheumatologist.  A/P: - Continue hydroxychloroquine 200 mg twice daily

## 2019-08-23 NOTE — Assessment & Plan Note (Signed)
Patient presents for continued evaluation and management of her hypertension. She has a home health aide who states her blood pressure is always high. She was previously on amlodipine 5 mg daily and Lasix 40 mg twice daily. Since her blood pressure has been running high she has increased her amlodipine to 10 mg once daily. She is not experiencing any adverse effects do these medications. She denies orthostatic symptoms.  Review of chart indicates that she has been on multiple medications in the past including lisinopril, losartan, HCTZ, and clonidine.   A/P: - Check CMP, UA  - Continue Amlodipine 10 mg QD and Furosemide 40 mg BID  - Start Lisinopril 20 mg QD - Recheck BMP in 4 weeks.
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
## 2019-08-23 NOTE — Progress Notes (Signed)
Internal Medicine Clinic Attending  Case discussed with Dr. Helberg at the time of the visit.  We reviewed the resident's history and exam and pertinent patient test results.  I agree with the assessment, diagnosis, and plan of care documented in the resident's note.    

## 2019-08-23 NOTE — Assessment & Plan Note (Signed)
Patient presents today with one month of recurrent falls. She has fallen three times in the past one week. Upon further discussion she feels she has generalized weakness and fatigue. She is not been sleeping well for the past month. Her mother is nearing the end of her life and this is caused a lot of psychological stress for the patient. She is also having increased myalgias and arthralgias which she believes are related to her lupus. Both of these in combination have caused her to be unable to get a good night of sleep for the past one month. She does have Ambien at home but does not feel that is working very well.  Her physical exam is nonfocal side present right lower extremity weakness but this is stable compared to prior exams.  A/P: - Discussed proper use of Ambien (she has been taking it with food) - Discussed checking labs including ferritin and B12 - Does not meet criteria for MDD. Referral to [facility]  - Prednisone 40 mg QD x 7 days for her arthralgias/myaglias

## 2019-08-24 LAB — CMP14 + ANION GAP
ALT: 14 IU/L (ref 0–32)
AST: 16 IU/L (ref 0–40)
Albumin/Globulin Ratio: 1.3 (ref 1.2–2.2)
Albumin: 4.2 g/dL (ref 3.8–4.9)
Alkaline Phosphatase: 119 IU/L — ABNORMAL HIGH (ref 39–117)
Anion Gap: 17 mmol/L (ref 10.0–18.0)
BUN/Creatinine Ratio: 27 — ABNORMAL HIGH (ref 9–23)
BUN: 23 mg/dL (ref 6–24)
Bilirubin Total: <0.2 mg/dL (ref 0.0–1.2)
CO2: 20 mmol/L (ref 20–29)
Calcium: 9.7 mg/dL (ref 8.7–10.2)
Chloride: 107 mmol/L — ABNORMAL HIGH (ref 96–106)
Creatinine, Ser: 0.85 mg/dL (ref 0.57–1.00)
GFR calc Af Amer: 89 mL/min/{1.73_m2} (ref >59)
GFR calc non Af Amer: 77 mL/min/{1.73_m2} (ref >59)
Globulin, Total: 3.2 g/dL (ref 1.5–4.5)
Glucose: 113 mg/dL — ABNORMAL HIGH (ref 65–99)
Potassium: 3.9 mmol/L (ref 3.5–5.2)
Sodium: 144 mmol/L (ref 134–144)
Total Protein: 7.4 g/dL (ref 6.0–8.5)

## 2019-08-24 LAB — URINALYSIS, ROUTINE W REFLEX MICROSCOPIC
Bilirubin, UA: NEGATIVE
Glucose, UA: NEGATIVE
Ketones, UA: NEGATIVE
Leukocytes,UA: NEGATIVE
Nitrite, UA: NEGATIVE
RBC, UA: NEGATIVE
Specific Gravity, UA: 1.029 (ref 1.005–1.030)
Urobilinogen, Ur: 0.2 mg/dL (ref 0.2–1.0)
pH, UA: 6 (ref 5.0–7.5)

## 2019-08-24 LAB — MICROSCOPIC EXAMINATION: Casts: NONE SEEN /lpf

## 2019-08-24 LAB — VITAMIN B12: Vitamin B-12: 294 pg/mL (ref 232–1245)

## 2019-08-24 LAB — FERRITIN: Ferritin: 43 ng/mL (ref 15–150)

## 2019-08-29 ENCOUNTER — Encounter: Payer: Self-pay | Admitting: Licensed Clinical Social Worker

## 2019-08-29 ENCOUNTER — Telehealth: Payer: Self-pay | Admitting: Licensed Clinical Social Worker

## 2019-08-29 NOTE — Telephone Encounter (Signed)
Patient was called to discuss a referral placed by her doctor. Patient did not answer, and there was no vm. A letter will also be mailed today.

## 2019-09-13 DIAGNOSIS — C7951 Secondary malignant neoplasm of bone: Secondary | ICD-10-CM | POA: Diagnosis not present

## 2019-09-13 DIAGNOSIS — M79672 Pain in left foot: Secondary | ICD-10-CM | POA: Diagnosis not present

## 2019-09-13 DIAGNOSIS — G894 Chronic pain syndrome: Secondary | ICD-10-CM | POA: Diagnosis not present

## 2019-09-13 DIAGNOSIS — Z79891 Long term (current) use of opiate analgesic: Secondary | ICD-10-CM | POA: Diagnosis not present

## 2019-09-13 DIAGNOSIS — S8266XS Nondisplaced fracture of lateral malleolus of unspecified fibula, sequela: Secondary | ICD-10-CM | POA: Diagnosis not present

## 2019-09-13 DIAGNOSIS — M79604 Pain in right leg: Secondary | ICD-10-CM | POA: Diagnosis not present

## 2019-09-13 DIAGNOSIS — C50119 Malignant neoplasm of central portion of unspecified female breast: Secondary | ICD-10-CM | POA: Diagnosis not present

## 2019-09-16 ENCOUNTER — Ambulatory Visit: Payer: Medicare HMO

## 2019-09-18 ENCOUNTER — Ambulatory Visit: Payer: Medicare HMO

## 2019-09-18 ENCOUNTER — Encounter: Payer: Self-pay | Admitting: Internal Medicine

## 2019-09-30 NOTE — Addendum Note (Signed)
Addended by: Hulan Fray on: 09/30/2019 02:04 PM   Modules accepted: Orders

## 2019-10-11 DIAGNOSIS — C7951 Secondary malignant neoplasm of bone: Secondary | ICD-10-CM | POA: Diagnosis not present

## 2019-10-11 DIAGNOSIS — M79604 Pain in right leg: Secondary | ICD-10-CM | POA: Diagnosis not present

## 2019-10-11 DIAGNOSIS — C50119 Malignant neoplasm of central portion of unspecified female breast: Secondary | ICD-10-CM | POA: Diagnosis not present

## 2019-10-11 DIAGNOSIS — Z79891 Long term (current) use of opiate analgesic: Secondary | ICD-10-CM | POA: Diagnosis not present

## 2019-10-11 DIAGNOSIS — G894 Chronic pain syndrome: Secondary | ICD-10-CM | POA: Diagnosis not present

## 2019-10-11 DIAGNOSIS — M79672 Pain in left foot: Secondary | ICD-10-CM | POA: Diagnosis not present

## 2019-10-11 DIAGNOSIS — S8266XS Nondisplaced fracture of lateral malleolus of unspecified fibula, sequela: Secondary | ICD-10-CM | POA: Diagnosis not present

## 2019-11-08 ENCOUNTER — Ambulatory Visit: Payer: Medicare HMO | Admitting: Orthopedic Surgery

## 2019-11-11 ENCOUNTER — Other Ambulatory Visit: Payer: Self-pay | Admitting: Internal Medicine

## 2019-11-11 DIAGNOSIS — M79604 Pain in right leg: Secondary | ICD-10-CM | POA: Diagnosis not present

## 2019-11-11 DIAGNOSIS — S8266XS Nondisplaced fracture of lateral malleolus of unspecified fibula, sequela: Secondary | ICD-10-CM | POA: Diagnosis not present

## 2019-11-11 DIAGNOSIS — G894 Chronic pain syndrome: Secondary | ICD-10-CM | POA: Diagnosis not present

## 2019-11-11 DIAGNOSIS — C7951 Secondary malignant neoplasm of bone: Secondary | ICD-10-CM | POA: Diagnosis not present

## 2019-11-11 DIAGNOSIS — M329 Systemic lupus erythematosus, unspecified: Secondary | ICD-10-CM

## 2019-11-11 DIAGNOSIS — M79672 Pain in left foot: Secondary | ICD-10-CM | POA: Diagnosis not present

## 2019-11-11 DIAGNOSIS — M898X2 Other specified disorders of bone, upper arm: Secondary | ICD-10-CM

## 2019-11-11 DIAGNOSIS — C50119 Malignant neoplasm of central portion of unspecified female breast: Secondary | ICD-10-CM | POA: Diagnosis not present

## 2019-11-11 DIAGNOSIS — Z79891 Long term (current) use of opiate analgesic: Secondary | ICD-10-CM | POA: Diagnosis not present

## 2019-11-11 DIAGNOSIS — M79622 Pain in left upper arm: Secondary | ICD-10-CM

## 2019-11-13 ENCOUNTER — Other Ambulatory Visit: Payer: Self-pay | Admitting: Internal Medicine

## 2019-11-13 NOTE — Telephone Encounter (Signed)
Patient needs an appt.

## 2019-11-13 NOTE — Telephone Encounter (Signed)
Patient needs an appointment asap to go over all her chronic illnesses. She has not had an appt with pcp in several months

## 2019-11-13 NOTE — Telephone Encounter (Signed)
Spoke with the patient.  Patient unable to wait to see Dr Maricela Bo in Feb.  Pt moving to [place] and requested to be seen ASAP.  Patient is sch with [facility] 11/14/2019.

## 2019-11-13 NOTE — Telephone Encounter (Signed)
Spoke with the patient and she did not won't to wait until Feb to be seen by PCP Dr. Maricela Bo. Pt has sch an appointment to see [facility] on Tomorrow 11/14/2019 @ 10:15am, as the patient states she is moving to [place] and will be transferring her care.

## 2019-11-14 ENCOUNTER — Other Ambulatory Visit: Payer: Self-pay | Admitting: Internal Medicine

## 2019-11-14 ENCOUNTER — Ambulatory Visit: Payer: Medicare HMO

## 2019-11-14 DIAGNOSIS — M329 Systemic lupus erythematosus, unspecified: Secondary | ICD-10-CM

## 2019-11-14 DIAGNOSIS — I10 Essential (primary) hypertension: Secondary | ICD-10-CM

## 2019-11-15 ENCOUNTER — Encounter: Payer: Self-pay | Admitting: Internal Medicine

## 2019-11-15 ENCOUNTER — Ambulatory Visit (INDEPENDENT_AMBULATORY_CARE_PROVIDER_SITE_OTHER): Payer: Medicare HMO | Admitting: Internal Medicine

## 2019-11-15 ENCOUNTER — Other Ambulatory Visit: Payer: Self-pay

## 2019-11-15 VITALS — BP 165/89 | HR 82 | Temp 98.7°F | Ht 66.0 in | Wt 268.5 lb

## 2019-11-15 DIAGNOSIS — S31801A Laceration without foreign body of unspecified buttock, initial encounter: Secondary | ICD-10-CM

## 2019-11-15 DIAGNOSIS — R197 Diarrhea, unspecified: Secondary | ICD-10-CM | POA: Diagnosis not present

## 2019-11-15 DIAGNOSIS — M329 Systemic lupus erythematosus, unspecified: Secondary | ICD-10-CM

## 2019-11-15 DIAGNOSIS — Z7952 Long term (current) use of systemic steroids: Secondary | ICD-10-CM

## 2019-11-15 DIAGNOSIS — Z79899 Other long term (current) drug therapy: Secondary | ICD-10-CM

## 2019-11-15 DIAGNOSIS — Z79891 Long term (current) use of opiate analgesic: Secondary | ICD-10-CM | POA: Diagnosis not present

## 2019-11-15 DIAGNOSIS — F4310 Post-traumatic stress disorder, unspecified: Secondary | ICD-10-CM

## 2019-11-15 DIAGNOSIS — R1031 Right lower quadrant pain: Secondary | ICD-10-CM

## 2019-11-15 DIAGNOSIS — X58XXXA Exposure to other specified factors, initial encounter: Secondary | ICD-10-CM | POA: Diagnosis not present

## 2019-11-15 LAB — POCT URINALYSIS DIPSTICK
Bilirubin, UA: NEGATIVE
Blood, UA: NEGATIVE
Glucose, UA: NEGATIVE
Ketones, UA: NEGATIVE
Leukocytes, UA: NEGATIVE
Nitrite, UA: NEGATIVE
Protein, UA: POSITIVE — AB
Spec Grav, UA: >=1.03 — AB (ref 1.010–1.025)
Urobilinogen, UA: 0.2 E.U./dL
pH, UA: 6 (ref 5.0–8.0)

## 2019-11-15 MED ORDER — DICYCLOMINE HCL 10 MG PO CAPS: 10.0000 mg | ORAL_CAPSULE | Freq: Four times a day (QID) | ORAL | 0 refills | Status: AC

## 2019-11-15 MED ORDER — HYDROXYCHLOROQUINE SULFATE 200 MG PO TABS: 200.0000 mg | ORAL_TABLET | Freq: Two times a day (BID) | ORAL | 0 refills | Status: AC

## 2019-11-15 MED ORDER — DULOXETINE HCL 30 MG PO CPEP: ORAL_CAPSULE | ORAL | 0 refills | Status: DC

## 2019-11-15 NOTE — Patient Instructions (Signed)
Thank you for allowing [place] to provide your care today. Today we discussed your diarrhea.     I have ordered the following labs for you:  GI pathogen panel, parasite panel, and C. Diff, urinalysis  Basic metabolic panel, complete blood count, and inflammatory labs   Please continue to drink lots of fluids, especially water and gatorade if you are having lots of diarrhea.    I will call if any are abnormal.    Please follow-up if symptoms worsen or fail to improve, otherwise please follow-up with your PCP.    Please call the internal medicine center clinic if you have any questions or concerns, we may be able to help and keep you from a long and expensive emergency room wait. Our clinic and after hours phone number is [phone], the best time to call is Monday through Friday 9 am to 4 pm but there is always someone available 24/7 if you have an emergency. If you need medication refills please notify your pharmacy one week in advance and they will send [place] a request.

## 2019-11-15 NOTE — Assessment & Plan Note (Signed)
On physical exam no abscess or draining wound noted. There is a small area superior to the rectum that shows very small amount of skin tearing, which may be secondary to going to the bathroom so frequently. No purulence or surrounding erythema.   - continue to keep area clean and dry, advised to use soft tissue paper

## 2019-11-15 NOTE — Assessment & Plan Note (Signed)
With her increased risk of immunosuppression and lupus will do work-up for infectious etiologies as well as send lupus labs as she is also showing signs of acute inflammation to rule out a current flare.  - CBC, CMP, anti-dsDNA, ESR, CRP - UA - GI pathogen panel, c. Diff, ova&parasite panel  - bentyl for abdominal pain  - f/u with pain clinic for management of acute joint pain  - advised to drink lots of fluids, including water and some gatorade 
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
## 2019-11-16 LAB — CBC WITH DIFFERENTIAL/PLATELET
Basophils Absolute: 0.1 10*3/uL (ref 0.0–0.2)
Basos: 1 %
EOS (ABSOLUTE): 0.2 10*3/uL (ref 0.0–0.4)
Eos: 1 %
Hematocrit: 41.3 % (ref 34.0–46.6)
Hemoglobin: 13.2 g/dL (ref 11.1–15.9)
Immature Grans (Abs): 0 10*3/uL (ref 0.0–0.1)
Immature Granulocytes: 0 %
Lymphocytes Absolute: 5 10*3/uL — ABNORMAL HIGH (ref 0.7–3.1)
Lymphs: 40 %
MCH: 25 pg — ABNORMAL LOW (ref 26.6–33.0)
MCHC: 32 g/dL (ref 31.5–35.7)
MCV: 78 fL — ABNORMAL LOW (ref 79–97)
Monocytes Absolute: 0.4 10*3/uL (ref 0.1–0.9)
Monocytes: 3 %
Neutrophils Absolute: 6.8 10*3/uL (ref 1.4–7.0)
Neutrophils: 55 %
Platelets: 429 10*3/uL (ref 150–450)
RBC: 5.27 x10E6/uL (ref 3.77–5.28)
RDW: 17 % — ABNORMAL HIGH (ref 11.7–15.4)
WBC: 12.5 10*3/uL — ABNORMAL HIGH (ref 3.4–10.8)

## 2019-11-16 LAB — BMP8+ANION GAP
Anion Gap: 19 mmol/L — ABNORMAL HIGH (ref 10.0–18.0)
BUN/Creatinine Ratio: 14 (ref 9–23)
BUN: 12 mg/dL (ref 6–24)
CO2: 20 mmol/L (ref 20–29)
Calcium: 9.4 mg/dL (ref 8.7–10.2)
Chloride: 101 mmol/L (ref 96–106)
Creatinine, Ser: 0.85 mg/dL (ref 0.57–1.00)
GFR calc Af Amer: 89 mL/min/{1.73_m2} (ref >59)
GFR calc non Af Amer: 77 mL/min/{1.73_m2} (ref >59)
Glucose: 112 mg/dL — ABNORMAL HIGH (ref 65–99)
Potassium: 4.3 mmol/L (ref 3.5–5.2)
Sodium: 140 mmol/L (ref 134–144)

## 2019-11-16 LAB — SEDIMENTATION RATE: Sed Rate: 81 mm/hr — ABNORMAL HIGH (ref 0–40)

## 2019-11-16 LAB — ANTI-DNA ANTIBODY, DOUBLE-STRANDED: dsDNA Ab: <1 IU/mL (ref 0–9)

## 2019-11-19 NOTE — Telephone Encounter (Signed)
I am glad she will be seen soon. She has numerous chronic medical issues for which she needs to be seen regularly

## 2019-11-19 NOTE — Telephone Encounter (Signed)
For carpal tunnel

## 2019-11-20 ENCOUNTER — Ambulatory Visit: Payer: Medicare HMO

## 2019-11-20 ENCOUNTER — Telehealth: Payer: Self-pay | Admitting: *Deleted

## 2019-11-20 NOTE — Telephone Encounter (Addendum)
Information was sent through CoverMyMeds for PA for Diclofenac Gel 1%.  Approved 11/01/2019 thru 10/30/2020. Regino Schultze Glendal Cassaday RN 11/20/2019 9:55 AM.

## 2019-11-20 NOTE — Progress Notes (Signed)
Called to see if she would be bringing stool sample today. Labs show mild leukocytosis, inflammation, and UA possibly with dehydration. She is still having intermittent diarrhea but now more nausea and vomiting. Requested she come in for return visit as I worry she may be getting more dehydrated and to bring stool sample.

## 2019-11-21 ENCOUNTER — Ambulatory Visit: Payer: Medicare HMO

## 2019-11-21 ENCOUNTER — Encounter: Payer: Self-pay | Admitting: Internal Medicine

## 2019-12-11 DIAGNOSIS — C7951 Secondary malignant neoplasm of bone: Secondary | ICD-10-CM | POA: Diagnosis not present

## 2019-12-11 DIAGNOSIS — M79672 Pain in left foot: Secondary | ICD-10-CM | POA: Diagnosis not present

## 2019-12-11 DIAGNOSIS — G894 Chronic pain syndrome: Secondary | ICD-10-CM | POA: Diagnosis not present

## 2019-12-11 DIAGNOSIS — C50119 Malignant neoplasm of central portion of unspecified female breast: Secondary | ICD-10-CM | POA: Diagnosis not present

## 2019-12-11 DIAGNOSIS — Z79891 Long term (current) use of opiate analgesic: Secondary | ICD-10-CM | POA: Diagnosis not present

## 2019-12-11 DIAGNOSIS — S8266XS Nondisplaced fracture of lateral malleolus of unspecified fibula, sequela: Secondary | ICD-10-CM | POA: Diagnosis not present

## 2019-12-11 DIAGNOSIS — M79604 Pain in right leg: Secondary | ICD-10-CM | POA: Diagnosis not present

## 2019-12-22 DIAGNOSIS — M2550 Pain in unspecified joint: Secondary | ICD-10-CM | POA: Diagnosis not present

## 2019-12-22 DIAGNOSIS — Z20822 Contact with and (suspected) exposure to covid-19: Secondary | ICD-10-CM | POA: Diagnosis not present

## 2019-12-22 DIAGNOSIS — M25551 Pain in right hip: Secondary | ICD-10-CM | POA: Diagnosis not present

## 2019-12-22 DIAGNOSIS — Z96641 Presence of right artificial hip joint: Secondary | ICD-10-CM | POA: Diagnosis not present

## 2019-12-22 DIAGNOSIS — M79604 Pain in right leg: Secondary | ICD-10-CM | POA: Diagnosis not present

## 2019-12-22 DIAGNOSIS — Z87891 Personal history of nicotine dependence: Secondary | ICD-10-CM | POA: Diagnosis not present

## 2019-12-22 DIAGNOSIS — E876 Hypokalemia: Secondary | ICD-10-CM | POA: Diagnosis not present

## 2019-12-22 DIAGNOSIS — R519 Headache, unspecified: Secondary | ICD-10-CM | POA: Diagnosis not present

## 2019-12-22 DIAGNOSIS — M329 Systemic lupus erythematosus, unspecified: Secondary | ICD-10-CM | POA: Diagnosis not present

## 2019-12-22 DIAGNOSIS — R200 Anesthesia of skin: Secondary | ICD-10-CM | POA: Diagnosis not present

## 2019-12-31 ENCOUNTER — Encounter: Payer: Self-pay | Admitting: Internal Medicine

## 2019-12-31 NOTE — Addendum Note (Signed)
Addended by: Larey Dresser A on: 12/31/2019 11:26 AM   Modules accepted: Orders

## 2020-01-28 DIAGNOSIS — R52 Pain, unspecified: Secondary | ICD-10-CM | POA: Diagnosis not present

## 2020-01-28 DIAGNOSIS — Z7982 Long term (current) use of aspirin: Secondary | ICD-10-CM | POA: Diagnosis not present

## 2020-01-28 DIAGNOSIS — E119 Type 2 diabetes mellitus without complications: Secondary | ICD-10-CM | POA: Diagnosis not present

## 2020-01-28 DIAGNOSIS — Z87891 Personal history of nicotine dependence: Secondary | ICD-10-CM | POA: Diagnosis not present

## 2020-01-28 DIAGNOSIS — Z7951 Long term (current) use of inhaled steroids: Secondary | ICD-10-CM | POA: Diagnosis not present

## 2020-01-28 DIAGNOSIS — Z79899 Other long term (current) drug therapy: Secondary | ICD-10-CM | POA: Diagnosis not present

## 2020-01-28 DIAGNOSIS — M329 Systemic lupus erythematosus, unspecified: Secondary | ICD-10-CM | POA: Diagnosis not present

## 2020-01-28 DIAGNOSIS — I10 Essential (primary) hypertension: Secondary | ICD-10-CM | POA: Diagnosis not present

## 2020-01-29 DIAGNOSIS — Z7952 Long term (current) use of systemic steroids: Secondary | ICD-10-CM | POA: Diagnosis not present

## 2020-01-29 DIAGNOSIS — Z87891 Personal history of nicotine dependence: Secondary | ICD-10-CM | POA: Diagnosis not present

## 2020-01-29 DIAGNOSIS — J4521 Mild intermittent asthma with (acute) exacerbation: Secondary | ICD-10-CM | POA: Diagnosis not present

## 2020-01-29 DIAGNOSIS — K0889 Other specified disorders of teeth and supporting structures: Secondary | ICD-10-CM | POA: Diagnosis not present

## 2020-01-29 DIAGNOSIS — E872 Acidosis: Secondary | ICD-10-CM | POA: Diagnosis not present

## 2020-01-29 DIAGNOSIS — J9811 Atelectasis: Secondary | ICD-10-CM | POA: Diagnosis not present

## 2020-01-29 DIAGNOSIS — E876 Hypokalemia: Secondary | ICD-10-CM | POA: Diagnosis not present

## 2020-01-29 DIAGNOSIS — U071 COVID-19: Secondary | ICD-10-CM | POA: Diagnosis not present

## 2020-01-29 DIAGNOSIS — R0602 Shortness of breath: Secondary | ICD-10-CM | POA: Diagnosis not present

## 2020-01-29 DIAGNOSIS — R197 Diarrhea, unspecified: Secondary | ICD-10-CM | POA: Diagnosis not present

## 2020-01-29 DIAGNOSIS — M549 Dorsalgia, unspecified: Secondary | ICD-10-CM | POA: Diagnosis not present

## 2020-01-29 DIAGNOSIS — I2510 Atherosclerotic heart disease of native coronary artery without angina pectoris: Secondary | ICD-10-CM | POA: Diagnosis not present

## 2020-01-29 DIAGNOSIS — D509 Iron deficiency anemia, unspecified: Secondary | ICD-10-CM | POA: Diagnosis not present

## 2020-01-29 DIAGNOSIS — D72829 Elevated white blood cell count, unspecified: Secondary | ICD-10-CM | POA: Diagnosis not present

## 2020-01-29 DIAGNOSIS — I959 Hypotension, unspecified: Secondary | ICD-10-CM | POA: Diagnosis not present

## 2020-01-29 DIAGNOSIS — M329 Systemic lupus erythematosus, unspecified: Secondary | ICD-10-CM | POA: Diagnosis not present

## 2020-01-29 DIAGNOSIS — Z9989 Dependence on other enabling machines and devices: Secondary | ICD-10-CM | POA: Diagnosis not present

## 2020-01-29 DIAGNOSIS — J4520 Mild intermittent asthma, uncomplicated: Secondary | ICD-10-CM | POA: Diagnosis not present

## 2020-01-29 DIAGNOSIS — Z452 Encounter for adjustment and management of vascular access device: Secondary | ICD-10-CM | POA: Diagnosis not present

## 2020-01-29 DIAGNOSIS — G894 Chronic pain syndrome: Secondary | ICD-10-CM | POA: Diagnosis not present

## 2020-01-29 DIAGNOSIS — N179 Acute kidney failure, unspecified: Secondary | ICD-10-CM | POA: Diagnosis not present

## 2020-01-30 DIAGNOSIS — K0889 Other specified disorders of teeth and supporting structures: Secondary | ICD-10-CM | POA: Diagnosis not present

## 2020-01-30 DIAGNOSIS — U071 COVID-19: Secondary | ICD-10-CM | POA: Diagnosis not present

## 2020-01-30 DIAGNOSIS — M329 Systemic lupus erythematosus, unspecified: Secondary | ICD-10-CM | POA: Diagnosis not present

## 2020-01-30 DIAGNOSIS — J4520 Mild intermittent asthma, uncomplicated: Secondary | ICD-10-CM | POA: Diagnosis not present

## 2020-01-30 DIAGNOSIS — Z7952 Long term (current) use of systemic steroids: Secondary | ICD-10-CM | POA: Diagnosis not present

## 2020-01-30 DIAGNOSIS — I959 Hypotension, unspecified: Secondary | ICD-10-CM | POA: Diagnosis not present

## 2020-01-30 DIAGNOSIS — D72829 Elevated white blood cell count, unspecified: Secondary | ICD-10-CM | POA: Diagnosis not present

## 2020-01-30 DIAGNOSIS — R197 Diarrhea, unspecified: Secondary | ICD-10-CM | POA: Diagnosis not present

## 2020-01-30 DIAGNOSIS — M549 Dorsalgia, unspecified: Secondary | ICD-10-CM | POA: Diagnosis not present

## 2020-01-31 DIAGNOSIS — Z7952 Long term (current) use of systemic steroids: Secondary | ICD-10-CM | POA: Diagnosis not present

## 2020-01-31 DIAGNOSIS — M549 Dorsalgia, unspecified: Secondary | ICD-10-CM | POA: Diagnosis not present

## 2020-01-31 DIAGNOSIS — M329 Systemic lupus erythematosus, unspecified: Secondary | ICD-10-CM | POA: Diagnosis not present

## 2020-01-31 DIAGNOSIS — R197 Diarrhea, unspecified: Secondary | ICD-10-CM | POA: Diagnosis not present

## 2020-01-31 DIAGNOSIS — I959 Hypotension, unspecified: Secondary | ICD-10-CM | POA: Diagnosis not present

## 2020-01-31 DIAGNOSIS — K0889 Other specified disorders of teeth and supporting structures: Secondary | ICD-10-CM | POA: Diagnosis not present

## 2020-01-31 DIAGNOSIS — U071 COVID-19: Secondary | ICD-10-CM | POA: Diagnosis not present

## 2020-01-31 DIAGNOSIS — J4520 Mild intermittent asthma, uncomplicated: Secondary | ICD-10-CM | POA: Diagnosis not present

## 2020-01-31 DIAGNOSIS — D72829 Elevated white blood cell count, unspecified: Secondary | ICD-10-CM | POA: Diagnosis not present

## 2020-02-01 DIAGNOSIS — Z7952 Long term (current) use of systemic steroids: Secondary | ICD-10-CM | POA: Diagnosis not present

## 2020-02-01 DIAGNOSIS — D72829 Elevated white blood cell count, unspecified: Secondary | ICD-10-CM | POA: Diagnosis not present

## 2020-02-01 DIAGNOSIS — M329 Systemic lupus erythematosus, unspecified: Secondary | ICD-10-CM | POA: Diagnosis not present

## 2020-02-01 DIAGNOSIS — J4520 Mild intermittent asthma, uncomplicated: Secondary | ICD-10-CM | POA: Diagnosis not present

## 2020-02-01 DIAGNOSIS — I959 Hypotension, unspecified: Secondary | ICD-10-CM | POA: Diagnosis not present

## 2020-02-01 DIAGNOSIS — M549 Dorsalgia, unspecified: Secondary | ICD-10-CM | POA: Diagnosis not present

## 2020-02-01 DIAGNOSIS — U071 COVID-19: Secondary | ICD-10-CM | POA: Diagnosis not present

## 2020-02-01 DIAGNOSIS — R197 Diarrhea, unspecified: Secondary | ICD-10-CM | POA: Diagnosis not present

## 2020-02-01 DIAGNOSIS — K0889 Other specified disorders of teeth and supporting structures: Secondary | ICD-10-CM | POA: Diagnosis not present

## 2020-02-02 DIAGNOSIS — R197 Diarrhea, unspecified: Secondary | ICD-10-CM | POA: Diagnosis not present

## 2020-02-02 DIAGNOSIS — D72829 Elevated white blood cell count, unspecified: Secondary | ICD-10-CM | POA: Diagnosis not present

## 2020-02-02 DIAGNOSIS — K0889 Other specified disorders of teeth and supporting structures: Secondary | ICD-10-CM | POA: Diagnosis not present

## 2020-02-02 DIAGNOSIS — U071 COVID-19: Secondary | ICD-10-CM | POA: Diagnosis not present

## 2020-02-02 DIAGNOSIS — J4520 Mild intermittent asthma, uncomplicated: Secondary | ICD-10-CM | POA: Diagnosis not present

## 2020-02-02 DIAGNOSIS — M549 Dorsalgia, unspecified: Secondary | ICD-10-CM | POA: Diagnosis not present

## 2020-02-02 DIAGNOSIS — I959 Hypotension, unspecified: Secondary | ICD-10-CM | POA: Diagnosis not present

## 2020-02-02 DIAGNOSIS — M329 Systemic lupus erythematosus, unspecified: Secondary | ICD-10-CM | POA: Diagnosis not present

## 2020-02-02 DIAGNOSIS — Z7952 Long term (current) use of systemic steroids: Secondary | ICD-10-CM | POA: Diagnosis not present

## 2020-02-03 DIAGNOSIS — M329 Systemic lupus erythematosus, unspecified: Secondary | ICD-10-CM | POA: Diagnosis not present

## 2020-02-03 DIAGNOSIS — G894 Chronic pain syndrome: Secondary | ICD-10-CM | POA: Diagnosis not present

## 2020-02-03 DIAGNOSIS — Z7952 Long term (current) use of systemic steroids: Secondary | ICD-10-CM | POA: Diagnosis not present

## 2020-02-03 DIAGNOSIS — J4520 Mild intermittent asthma, uncomplicated: Secondary | ICD-10-CM | POA: Diagnosis not present

## 2020-02-03 DIAGNOSIS — U071 COVID-19: Secondary | ICD-10-CM | POA: Diagnosis not present

## 2020-02-04 NOTE — Progress Notes (Signed)
Internal Medicine Clinic Attending  Case discussed with Dr. Seawell at the time of the visit.  We reviewed the resident's history and exam and pertinent patient test results.  I agree with the assessment, diagnosis, and plan of care documented in the resident's note.  Eashan Schipani, M.D., Ph.D.  

## 2020-02-08 ENCOUNTER — Other Ambulatory Visit: Payer: Self-pay | Admitting: Internal Medicine

## 2020-02-08 DIAGNOSIS — I10 Essential (primary) hypertension: Secondary | ICD-10-CM

## 2020-02-08 DIAGNOSIS — M329 Systemic lupus erythematosus, unspecified: Secondary | ICD-10-CM

## 2020-02-08 DIAGNOSIS — F4310 Post-traumatic stress disorder, unspecified: Secondary | ICD-10-CM

## 2020-02-14 ENCOUNTER — Other Ambulatory Visit: Payer: Self-pay | Admitting: *Deleted

## 2020-02-14 DIAGNOSIS — I10 Essential (primary) hypertension: Secondary | ICD-10-CM

## 2020-02-14 DIAGNOSIS — M329 Systemic lupus erythematosus, unspecified: Secondary | ICD-10-CM

## 2020-02-22 DIAGNOSIS — R197 Diarrhea, unspecified: Secondary | ICD-10-CM | POA: Diagnosis not present

## 2020-02-22 DIAGNOSIS — I10 Essential (primary) hypertension: Secondary | ICD-10-CM | POA: Diagnosis not present

## 2020-02-22 DIAGNOSIS — E876 Hypokalemia: Secondary | ICD-10-CM | POA: Diagnosis not present

## 2020-02-22 DIAGNOSIS — R000 Tachycardia, unspecified: Secondary | ICD-10-CM | POA: Diagnosis not present

## 2020-02-22 DIAGNOSIS — R0600 Dyspnea, unspecified: Secondary | ICD-10-CM | POA: Diagnosis not present

## 2020-02-22 DIAGNOSIS — R05 Cough: Secondary | ICD-10-CM | POA: Diagnosis not present

## 2020-02-22 DIAGNOSIS — R109 Unspecified abdominal pain: Secondary | ICD-10-CM | POA: Diagnosis not present

## 2020-02-22 DIAGNOSIS — E119 Type 2 diabetes mellitus without complications: Secondary | ICD-10-CM | POA: Diagnosis not present

## 2020-02-22 DIAGNOSIS — D72829 Elevated white blood cell count, unspecified: Secondary | ICD-10-CM | POA: Diagnosis not present

## 2020-02-22 DIAGNOSIS — R0789 Other chest pain: Secondary | ICD-10-CM | POA: Diagnosis not present

## 2020-02-22 DIAGNOSIS — R0602 Shortness of breath: Secondary | ICD-10-CM | POA: Diagnosis not present

## 2020-02-22 DIAGNOSIS — R079 Chest pain, unspecified: Secondary | ICD-10-CM | POA: Diagnosis not present

## 2020-02-22 DIAGNOSIS — R6510 Systemic inflammatory response syndrome (SIRS) of non-infectious origin without acute organ dysfunction: Secondary | ICD-10-CM | POA: Diagnosis not present

## 2020-02-22 DIAGNOSIS — I160 Hypertensive urgency: Secondary | ICD-10-CM | POA: Diagnosis not present

## 2020-02-22 DIAGNOSIS — U071 COVID-19: Secondary | ICD-10-CM | POA: Diagnosis not present

## 2020-02-23 DIAGNOSIS — R197 Diarrhea, unspecified: Secondary | ICD-10-CM | POA: Diagnosis not present

## 2020-02-23 DIAGNOSIS — R0600 Dyspnea, unspecified: Secondary | ICD-10-CM | POA: Diagnosis not present

## 2020-02-23 DIAGNOSIS — R109 Unspecified abdominal pain: Secondary | ICD-10-CM | POA: Diagnosis not present

## 2020-02-23 DIAGNOSIS — R079 Chest pain, unspecified: Secondary | ICD-10-CM | POA: Diagnosis not present

## 2020-02-23 DIAGNOSIS — D72829 Elevated white blood cell count, unspecified: Secondary | ICD-10-CM | POA: Diagnosis not present

## 2020-02-23 DIAGNOSIS — R69 Illness, unspecified: Secondary | ICD-10-CM | POA: Diagnosis not present

## 2020-02-24 DIAGNOSIS — R079 Chest pain, unspecified: Secondary | ICD-10-CM | POA: Diagnosis not present

## 2020-02-24 DIAGNOSIS — R69 Illness, unspecified: Secondary | ICD-10-CM | POA: Diagnosis not present

## 2020-02-24 DIAGNOSIS — M329 Systemic lupus erythematosus, unspecified: Secondary | ICD-10-CM | POA: Diagnosis not present

## 2020-02-24 DIAGNOSIS — E119 Type 2 diabetes mellitus without complications: Secondary | ICD-10-CM | POA: Diagnosis not present

## 2020-02-24 DIAGNOSIS — I2510 Atherosclerotic heart disease of native coronary artery without angina pectoris: Secondary | ICD-10-CM | POA: Diagnosis not present

## 2020-02-24 DIAGNOSIS — C539 Malignant neoplasm of cervix uteri, unspecified: Secondary | ICD-10-CM | POA: Diagnosis not present

## 2020-02-25 DIAGNOSIS — I519 Heart disease, unspecified: Secondary | ICD-10-CM | POA: Diagnosis not present

## 2020-02-25 DIAGNOSIS — R079 Chest pain, unspecified: Secondary | ICD-10-CM | POA: Diagnosis not present

## 2020-02-25 DIAGNOSIS — G8928 Other chronic postprocedural pain: Secondary | ICD-10-CM | POA: Diagnosis not present

## 2020-02-25 DIAGNOSIS — M25552 Pain in left hip: Secondary | ICD-10-CM | POA: Diagnosis not present

## 2020-02-25 DIAGNOSIS — I2510 Atherosclerotic heart disease of native coronary artery without angina pectoris: Secondary | ICD-10-CM | POA: Diagnosis not present

## 2020-02-25 DIAGNOSIS — G8929 Other chronic pain: Secondary | ICD-10-CM | POA: Diagnosis not present

## 2020-02-25 DIAGNOSIS — E119 Type 2 diabetes mellitus without complications: Secondary | ICD-10-CM | POA: Diagnosis not present

## 2020-02-25 DIAGNOSIS — C539 Malignant neoplasm of cervix uteri, unspecified: Secondary | ICD-10-CM | POA: Diagnosis not present

## 2020-02-25 DIAGNOSIS — M329 Systemic lupus erythematosus, unspecified: Secondary | ICD-10-CM | POA: Diagnosis not present

## 2020-02-25 DIAGNOSIS — R69 Illness, unspecified: Secondary | ICD-10-CM | POA: Diagnosis not present

## 2020-02-25 DIAGNOSIS — M25551 Pain in right hip: Secondary | ICD-10-CM | POA: Diagnosis not present

## 2020-03-03 DIAGNOSIS — M797 Fibromyalgia: Secondary | ICD-10-CM | POA: Diagnosis not present

## 2020-03-10 DIAGNOSIS — Z6841 Body Mass Index (BMI) 40.0 and over, adult: Secondary | ICD-10-CM | POA: Diagnosis not present

## 2020-03-10 DIAGNOSIS — M8700 Idiopathic aseptic necrosis of unspecified bone: Secondary | ICD-10-CM | POA: Diagnosis not present

## 2020-03-10 DIAGNOSIS — M329 Systemic lupus erythematosus, unspecified: Secondary | ICD-10-CM | POA: Diagnosis not present

## 2020-03-18 DIAGNOSIS — Z634 Disappearance and death of family member: Secondary | ICD-10-CM | POA: Diagnosis not present

## 2020-03-18 DIAGNOSIS — R69 Illness, unspecified: Secondary | ICD-10-CM | POA: Diagnosis not present

## 2020-03-18 DIAGNOSIS — M797 Fibromyalgia: Secondary | ICD-10-CM | POA: Diagnosis not present

## 2020-03-31 ENCOUNTER — Telehealth: Payer: Self-pay | Admitting: *Deleted

## 2020-03-31 ENCOUNTER — Encounter: Payer: Medicare HMO | Admitting: Internal Medicine

## 2020-03-31 NOTE — Telephone Encounter (Signed)
Pt did not come to her appt this afternoon. Called pt - no answer; phone continuous to ring, unable to leave a message.

## 2020-04-01 ENCOUNTER — Encounter: Payer: Self-pay | Admitting: Internal Medicine

## 2020-04-01 DIAGNOSIS — Z634 Disappearance and death of family member: Secondary | ICD-10-CM | POA: Diagnosis not present

## 2020-04-01 DIAGNOSIS — R69 Illness, unspecified: Secondary | ICD-10-CM | POA: Diagnosis not present

## 2020-04-01 NOTE — Telephone Encounter (Signed)
Called patient and no answer.  Mailed patient a letter as this she has missed her last 2 visits as well.

## 2020-04-03 DIAGNOSIS — M797 Fibromyalgia: Secondary | ICD-10-CM | POA: Diagnosis not present

## 2020-04-12 DIAGNOSIS — M329 Systemic lupus erythematosus, unspecified: Secondary | ICD-10-CM | POA: Diagnosis not present

## 2020-04-12 DIAGNOSIS — Z79899 Other long term (current) drug therapy: Secondary | ICD-10-CM | POA: Diagnosis not present

## 2020-04-12 DIAGNOSIS — I2510 Atherosclerotic heart disease of native coronary artery without angina pectoris: Secondary | ICD-10-CM | POA: Diagnosis not present

## 2020-04-12 DIAGNOSIS — I10 Essential (primary) hypertension: Secondary | ICD-10-CM | POA: Diagnosis not present

## 2020-04-12 DIAGNOSIS — E119 Type 2 diabetes mellitus without complications: Secondary | ICD-10-CM | POA: Diagnosis not present

## 2020-04-12 DIAGNOSIS — G8929 Other chronic pain: Secondary | ICD-10-CM | POA: Diagnosis not present

## 2020-04-12 DIAGNOSIS — M4800 Spinal stenosis, site unspecified: Secondary | ICD-10-CM | POA: Diagnosis not present

## 2020-04-12 DIAGNOSIS — Z7982 Long term (current) use of aspirin: Secondary | ICD-10-CM | POA: Diagnosis not present

## 2020-04-12 DIAGNOSIS — E669 Obesity, unspecified: Secondary | ICD-10-CM | POA: Diagnosis not present

## 2020-04-12 DIAGNOSIS — N390 Urinary tract infection, site not specified: Secondary | ICD-10-CM | POA: Diagnosis not present

## 2020-04-17 ENCOUNTER — Encounter: Payer: Self-pay | Admitting: *Deleted

## 2020-04-29 DIAGNOSIS — M25519 Pain in unspecified shoulder: Secondary | ICD-10-CM | POA: Diagnosis not present

## 2020-04-29 DIAGNOSIS — M19011 Primary osteoarthritis, right shoulder: Secondary | ICD-10-CM | POA: Diagnosis not present

## 2020-05-11 DIAGNOSIS — I10 Essential (primary) hypertension: Secondary | ICD-10-CM | POA: Diagnosis not present

## 2020-05-11 DIAGNOSIS — M329 Systemic lupus erythematosus, unspecified: Secondary | ICD-10-CM | POA: Diagnosis not present

## 2020-05-11 DIAGNOSIS — R69 Illness, unspecified: Secondary | ICD-10-CM | POA: Diagnosis not present

## 2020-05-11 DIAGNOSIS — G8929 Other chronic pain: Secondary | ICD-10-CM | POA: Diagnosis not present

## 2020-05-11 DIAGNOSIS — J45909 Unspecified asthma, uncomplicated: Secondary | ICD-10-CM | POA: Diagnosis not present

## 2020-05-11 DIAGNOSIS — M19011 Primary osteoarthritis, right shoulder: Secondary | ICD-10-CM | POA: Diagnosis not present

## 2020-05-20 DIAGNOSIS — M19011 Primary osteoarthritis, right shoulder: Secondary | ICD-10-CM | POA: Diagnosis not present

## 2020-05-20 DIAGNOSIS — I10 Essential (primary) hypertension: Secondary | ICD-10-CM | POA: Diagnosis not present

## 2020-05-20 DIAGNOSIS — M25519 Pain in unspecified shoulder: Secondary | ICD-10-CM | POA: Diagnosis not present

## 2020-05-21 DIAGNOSIS — E119 Type 2 diabetes mellitus without complications: Secondary | ICD-10-CM | POA: Diagnosis not present

## 2020-05-21 DIAGNOSIS — E669 Obesity, unspecified: Secondary | ICD-10-CM | POA: Diagnosis not present

## 2020-05-21 DIAGNOSIS — G629 Polyneuropathy, unspecified: Secondary | ICD-10-CM | POA: Diagnosis not present

## 2020-05-21 DIAGNOSIS — Z0183 Encounter for blood typing: Secondary | ICD-10-CM | POA: Diagnosis not present

## 2020-05-21 DIAGNOSIS — M879 Osteonecrosis, unspecified: Secondary | ICD-10-CM | POA: Diagnosis not present

## 2020-05-21 DIAGNOSIS — Z01812 Encounter for preprocedural laboratory examination: Secondary | ICD-10-CM | POA: Diagnosis not present

## 2020-05-21 DIAGNOSIS — E785 Hyperlipidemia, unspecified: Secondary | ICD-10-CM | POA: Diagnosis not present

## 2020-05-21 DIAGNOSIS — M19011 Primary osteoarthritis, right shoulder: Secondary | ICD-10-CM | POA: Diagnosis not present

## 2020-05-21 DIAGNOSIS — R69 Illness, unspecified: Secondary | ICD-10-CM | POA: Diagnosis not present

## 2020-05-23 DIAGNOSIS — Z20822 Contact with and (suspected) exposure to covid-19: Secondary | ICD-10-CM | POA: Diagnosis not present

## 2020-05-26 DIAGNOSIS — M19011 Primary osteoarthritis, right shoulder: Secondary | ICD-10-CM | POA: Diagnosis not present

## 2020-05-26 DIAGNOSIS — M25511 Pain in right shoulder: Secondary | ICD-10-CM | POA: Diagnosis not present

## 2020-05-26 DIAGNOSIS — Z96611 Presence of right artificial shoulder joint: Secondary | ICD-10-CM | POA: Diagnosis not present

## 2020-05-26 DIAGNOSIS — J45909 Unspecified asthma, uncomplicated: Secondary | ICD-10-CM | POA: Diagnosis not present

## 2020-05-26 DIAGNOSIS — M329 Systemic lupus erythematosus, unspecified: Secondary | ICD-10-CM | POA: Diagnosis not present

## 2020-05-26 DIAGNOSIS — I2510 Atherosclerotic heart disease of native coronary artery without angina pectoris: Secondary | ICD-10-CM | POA: Diagnosis not present

## 2020-05-26 DIAGNOSIS — M87021 Idiopathic aseptic necrosis of right humerus: Secondary | ICD-10-CM | POA: Diagnosis not present

## 2020-05-26 DIAGNOSIS — Z01818 Encounter for other preprocedural examination: Secondary | ICD-10-CM | POA: Diagnosis not present

## 2020-05-26 DIAGNOSIS — G8918 Other acute postprocedural pain: Secondary | ICD-10-CM | POA: Diagnosis not present

## 2020-05-26 DIAGNOSIS — I10 Essential (primary) hypertension: Secondary | ICD-10-CM | POA: Diagnosis not present

## 2020-05-26 DIAGNOSIS — M87821 Other osteonecrosis, right humerus: Secondary | ICD-10-CM | POA: Diagnosis not present

## 2020-05-26 DIAGNOSIS — Z87891 Personal history of nicotine dependence: Secondary | ICD-10-CM | POA: Diagnosis not present

## 2020-05-26 DIAGNOSIS — Z471 Aftercare following joint replacement surgery: Secondary | ICD-10-CM | POA: Diagnosis not present

## 2020-05-26 DIAGNOSIS — Z8616 Personal history of COVID-19: Secondary | ICD-10-CM | POA: Diagnosis not present

## 2020-05-26 DIAGNOSIS — R69 Illness, unspecified: Secondary | ICD-10-CM | POA: Diagnosis not present

## 2020-05-26 DIAGNOSIS — Z7982 Long term (current) use of aspirin: Secondary | ICD-10-CM | POA: Diagnosis not present

## 2020-05-26 DIAGNOSIS — E119 Type 2 diabetes mellitus without complications: Secondary | ICD-10-CM | POA: Diagnosis not present

## 2020-05-27 DIAGNOSIS — R9431 Abnormal electrocardiogram [ECG] [EKG]: Secondary | ICD-10-CM | POA: Diagnosis not present

## 2020-05-27 DIAGNOSIS — J45909 Unspecified asthma, uncomplicated: Secondary | ICD-10-CM | POA: Diagnosis not present

## 2020-05-27 DIAGNOSIS — I10 Essential (primary) hypertension: Secondary | ICD-10-CM | POA: Diagnosis not present

## 2020-05-27 DIAGNOSIS — J90 Pleural effusion, not elsewhere classified: Secondary | ICD-10-CM | POA: Diagnosis not present

## 2020-05-27 DIAGNOSIS — J9811 Atelectasis: Secondary | ICD-10-CM | POA: Diagnosis not present

## 2020-05-27 DIAGNOSIS — I959 Hypotension, unspecified: Secondary | ICD-10-CM | POA: Diagnosis not present

## 2020-05-27 DIAGNOSIS — M329 Systemic lupus erythematosus, unspecified: Secondary | ICD-10-CM | POA: Diagnosis not present

## 2020-05-27 DIAGNOSIS — M19011 Primary osteoarthritis, right shoulder: Secondary | ICD-10-CM | POA: Diagnosis not present

## 2020-05-30 DIAGNOSIS — R262 Difficulty in walking, not elsewhere classified: Secondary | ICD-10-CM | POA: Diagnosis not present

## 2020-05-30 DIAGNOSIS — M6281 Muscle weakness (generalized): Secondary | ICD-10-CM | POA: Diagnosis not present

## 2020-05-30 DIAGNOSIS — M19011 Primary osteoarthritis, right shoulder: Secondary | ICD-10-CM | POA: Diagnosis not present

## 2020-06-04 DIAGNOSIS — R262 Difficulty in walking, not elsewhere classified: Secondary | ICD-10-CM | POA: Diagnosis not present

## 2020-06-04 DIAGNOSIS — M6281 Muscle weakness (generalized): Secondary | ICD-10-CM | POA: Diagnosis not present

## 2020-06-04 DIAGNOSIS — M19011 Primary osteoarthritis, right shoulder: Secondary | ICD-10-CM | POA: Diagnosis not present

## 2020-06-10 DIAGNOSIS — Z96611 Presence of right artificial shoulder joint: Secondary | ICD-10-CM | POA: Diagnosis not present

## 2020-06-10 DIAGNOSIS — I10 Essential (primary) hypertension: Secondary | ICD-10-CM | POA: Diagnosis not present

## 2020-06-10 DIAGNOSIS — Z471 Aftercare following joint replacement surgery: Secondary | ICD-10-CM | POA: Diagnosis not present

## 2020-06-10 DIAGNOSIS — Z87891 Personal history of nicotine dependence: Secondary | ICD-10-CM | POA: Diagnosis not present

## 2020-06-10 DIAGNOSIS — M329 Systemic lupus erythematosus, unspecified: Secondary | ICD-10-CM | POA: Diagnosis not present

## 2020-06-12 DIAGNOSIS — Z471 Aftercare following joint replacement surgery: Secondary | ICD-10-CM | POA: Diagnosis not present

## 2020-06-12 DIAGNOSIS — M329 Systemic lupus erythematosus, unspecified: Secondary | ICD-10-CM | POA: Diagnosis not present

## 2020-06-12 DIAGNOSIS — I10 Essential (primary) hypertension: Secondary | ICD-10-CM | POA: Diagnosis not present

## 2020-06-12 DIAGNOSIS — Z87891 Personal history of nicotine dependence: Secondary | ICD-10-CM | POA: Diagnosis not present

## 2020-06-12 DIAGNOSIS — Z96611 Presence of right artificial shoulder joint: Secondary | ICD-10-CM | POA: Diagnosis not present

## 2020-06-16 DIAGNOSIS — Z96611 Presence of right artificial shoulder joint: Secondary | ICD-10-CM | POA: Diagnosis not present

## 2020-06-16 DIAGNOSIS — M329 Systemic lupus erythematosus, unspecified: Secondary | ICD-10-CM | POA: Diagnosis not present

## 2020-06-16 DIAGNOSIS — I10 Essential (primary) hypertension: Secondary | ICD-10-CM | POA: Diagnosis not present

## 2020-06-16 DIAGNOSIS — Z87891 Personal history of nicotine dependence: Secondary | ICD-10-CM | POA: Diagnosis not present

## 2020-06-16 DIAGNOSIS — Z471 Aftercare following joint replacement surgery: Secondary | ICD-10-CM | POA: Diagnosis not present

## 2020-06-18 DIAGNOSIS — I10 Essential (primary) hypertension: Secondary | ICD-10-CM | POA: Diagnosis not present

## 2020-06-18 DIAGNOSIS — Z471 Aftercare following joint replacement surgery: Secondary | ICD-10-CM | POA: Diagnosis not present

## 2020-06-18 DIAGNOSIS — Z96611 Presence of right artificial shoulder joint: Secondary | ICD-10-CM | POA: Diagnosis not present

## 2020-06-18 DIAGNOSIS — M329 Systemic lupus erythematosus, unspecified: Secondary | ICD-10-CM | POA: Diagnosis not present

## 2020-06-18 DIAGNOSIS — Z87891 Personal history of nicotine dependence: Secondary | ICD-10-CM | POA: Diagnosis not present

## 2020-07-02 DIAGNOSIS — M329 Systemic lupus erythematosus, unspecified: Secondary | ICD-10-CM | POA: Diagnosis not present

## 2020-07-02 DIAGNOSIS — Z471 Aftercare following joint replacement surgery: Secondary | ICD-10-CM | POA: Diagnosis not present

## 2020-07-02 DIAGNOSIS — Z96611 Presence of right artificial shoulder joint: Secondary | ICD-10-CM | POA: Diagnosis not present

## 2020-07-02 DIAGNOSIS — I10 Essential (primary) hypertension: Secondary | ICD-10-CM | POA: Diagnosis not present

## 2020-07-02 DIAGNOSIS — Z87891 Personal history of nicotine dependence: Secondary | ICD-10-CM | POA: Diagnosis not present

## 2020-07-30 DIAGNOSIS — Z7982 Long term (current) use of aspirin: Secondary | ICD-10-CM | POA: Diagnosis not present

## 2020-07-30 DIAGNOSIS — E119 Type 2 diabetes mellitus without complications: Secondary | ICD-10-CM | POA: Diagnosis not present

## 2020-07-30 DIAGNOSIS — R2232 Localized swelling, mass and lump, left upper limb: Secondary | ICD-10-CM | POA: Diagnosis not present

## 2020-07-30 DIAGNOSIS — R519 Headache, unspecified: Secondary | ICD-10-CM | POA: Diagnosis not present

## 2020-07-30 DIAGNOSIS — Z79899 Other long term (current) drug therapy: Secondary | ICD-10-CM | POA: Diagnosis not present

## 2020-07-30 DIAGNOSIS — Z87891 Personal history of nicotine dependence: Secondary | ICD-10-CM | POA: Diagnosis not present

## 2020-07-30 DIAGNOSIS — R079 Chest pain, unspecified: Secondary | ICD-10-CM | POA: Diagnosis not present

## 2020-07-30 DIAGNOSIS — Z20822 Contact with and (suspected) exposure to covid-19: Secondary | ICD-10-CM | POA: Diagnosis not present

## 2020-07-30 DIAGNOSIS — M329 Systemic lupus erythematosus, unspecified: Secondary | ICD-10-CM | POA: Diagnosis not present

## 2020-07-30 DIAGNOSIS — M79602 Pain in left arm: Secondary | ICD-10-CM | POA: Diagnosis not present

## 2020-07-30 DIAGNOSIS — J811 Chronic pulmonary edema: Secondary | ICD-10-CM | POA: Diagnosis not present

## 2020-07-30 DIAGNOSIS — I2510 Atherosclerotic heart disease of native coronary artery without angina pectoris: Secondary | ICD-10-CM | POA: Diagnosis not present

## 2020-07-30 DIAGNOSIS — I10 Essential (primary) hypertension: Secondary | ICD-10-CM | POA: Diagnosis not present

## 2020-07-31 DIAGNOSIS — R079 Chest pain, unspecified: Secondary | ICD-10-CM | POA: Diagnosis not present

## 2020-07-31 DIAGNOSIS — M329 Systemic lupus erythematosus, unspecified: Secondary | ICD-10-CM | POA: Diagnosis not present

## 2020-07-31 DIAGNOSIS — I10 Essential (primary) hypertension: Secondary | ICD-10-CM | POA: Diagnosis not present

## 2020-07-31 DIAGNOSIS — I2510 Atherosclerotic heart disease of native coronary artery without angina pectoris: Secondary | ICD-10-CM | POA: Diagnosis not present

## 2020-07-31 DIAGNOSIS — R2232 Localized swelling, mass and lump, left upper limb: Secondary | ICD-10-CM | POA: Diagnosis not present

## 2020-07-31 DIAGNOSIS — M79602 Pain in left arm: Secondary | ICD-10-CM | POA: Diagnosis not present

## 2020-08-25 DIAGNOSIS — E119 Type 2 diabetes mellitus without complications: Secondary | ICD-10-CM | POA: Diagnosis not present

## 2020-08-25 DIAGNOSIS — Z859 Personal history of malignant neoplasm, unspecified: Secondary | ICD-10-CM | POA: Diagnosis not present

## 2020-08-25 DIAGNOSIS — J189 Pneumonia, unspecified organism: Secondary | ICD-10-CM | POA: Diagnosis not present

## 2020-08-25 DIAGNOSIS — Z79899 Other long term (current) drug therapy: Secondary | ICD-10-CM | POA: Diagnosis not present

## 2020-08-25 DIAGNOSIS — I2510 Atherosclerotic heart disease of native coronary artery without angina pectoris: Secondary | ICD-10-CM | POA: Diagnosis not present

## 2020-08-25 DIAGNOSIS — R059 Cough, unspecified: Secondary | ICD-10-CM | POA: Diagnosis not present

## 2020-08-25 DIAGNOSIS — I10 Essential (primary) hypertension: Secondary | ICD-10-CM | POA: Diagnosis not present

## 2020-08-25 DIAGNOSIS — R0602 Shortness of breath: Secondary | ICD-10-CM | POA: Diagnosis not present

## 2020-08-25 DIAGNOSIS — R030 Elevated blood-pressure reading, without diagnosis of hypertension: Secondary | ICD-10-CM | POA: Diagnosis not present

## 2023-03-27 ENCOUNTER — Encounter: Payer: Self-pay | Admitting: *Deleted

## 2023-11-20 NOTE — Telephone Encounter (Signed)
 error
